# Patient Record
Sex: Female | Born: 1945 | Race: White | Hispanic: No | Smoking: Former smoker
Health system: Southern US, Community
[De-identification: ages and names within clinical notes are randomized; demographics above are authoritative.]

## PROBLEM LIST (undated history)

## (undated) DIAGNOSIS — F0390 Unspecified dementia without behavioral disturbance: Secondary | ICD-10-CM

## (undated) DIAGNOSIS — M419 Scoliosis, unspecified: Secondary | ICD-10-CM

## (undated) DIAGNOSIS — N179 Acute kidney failure, unspecified: Secondary | ICD-10-CM

## (undated) DIAGNOSIS — N183 Chronic kidney disease, stage 3 unspecified: Secondary | ICD-10-CM

## (undated) DIAGNOSIS — Z7901 Long term (current) use of anticoagulants: Secondary | ICD-10-CM

## (undated) DIAGNOSIS — Z9889 Other specified postprocedural states: Secondary | ICD-10-CM

## (undated) DIAGNOSIS — I701 Atherosclerosis of renal artery: Secondary | ICD-10-CM

## (undated) DIAGNOSIS — L511 Stevens-Johnson syndrome: Secondary | ICD-10-CM

## (undated) DIAGNOSIS — G8929 Other chronic pain: Secondary | ICD-10-CM

## (undated) DIAGNOSIS — T8859XA Other complications of anesthesia, initial encounter: Secondary | ICD-10-CM

## (undated) DIAGNOSIS — R6520 Severe sepsis without septic shock: Secondary | ICD-10-CM

## (undated) DIAGNOSIS — M5030 Other cervical disc degeneration, unspecified cervical region: Secondary | ICD-10-CM

## (undated) DIAGNOSIS — M5136 Other intervertebral disc degeneration, lumbar region: Secondary | ICD-10-CM

## (undated) DIAGNOSIS — R112 Nausea with vomiting, unspecified: Secondary | ICD-10-CM

## (undated) DIAGNOSIS — F32A Depression, unspecified: Secondary | ICD-10-CM

## (undated) DIAGNOSIS — F0393 Unspecified dementia, unspecified severity, with mood disturbance: Secondary | ICD-10-CM

## (undated) DIAGNOSIS — I700 Atherosclerosis of aorta: Secondary | ICD-10-CM

## (undated) DIAGNOSIS — A4151 Sepsis due to Escherichia coli [E. coli]: Secondary | ICD-10-CM

## (undated) DIAGNOSIS — T50905A Adverse effect of unspecified drugs, medicaments and biological substances, initial encounter: Secondary | ICD-10-CM

## (undated) DIAGNOSIS — G4700 Insomnia, unspecified: Secondary | ICD-10-CM

## (undated) DIAGNOSIS — M51369 Other intervertebral disc degeneration, lumbar region without mention of lumbar back pain or lower extremity pain: Secondary | ICD-10-CM

## (undated) DIAGNOSIS — Z79899 Other long term (current) drug therapy: Secondary | ICD-10-CM

## (undated) DIAGNOSIS — M810 Age-related osteoporosis without current pathological fracture: Secondary | ICD-10-CM

## (undated) DIAGNOSIS — M1990 Unspecified osteoarthritis, unspecified site: Secondary | ICD-10-CM

## (undated) DIAGNOSIS — J189 Pneumonia, unspecified organism: Secondary | ICD-10-CM

## (undated) DIAGNOSIS — I10 Essential (primary) hypertension: Secondary | ICD-10-CM

## (undated) DIAGNOSIS — Z87442 Personal history of urinary calculi: Secondary | ICD-10-CM

## (undated) DIAGNOSIS — R0600 Dyspnea, unspecified: Secondary | ICD-10-CM

## (undated) DIAGNOSIS — K219 Gastro-esophageal reflux disease without esophagitis: Secondary | ICD-10-CM

## (undated) DIAGNOSIS — R1310 Dysphagia, unspecified: Secondary | ICD-10-CM

## (undated) DIAGNOSIS — E46 Unspecified protein-calorie malnutrition: Secondary | ICD-10-CM

## (undated) DIAGNOSIS — D649 Anemia, unspecified: Secondary | ICD-10-CM

## (undated) HISTORY — PX: HEMORRHOID SURGERY: SHX153

## (undated) HISTORY — PX: HAND SURGERY: SHX662

## (undated) HISTORY — PX: CATARACT EXTRACTION, BILATERAL: SHX1313

## (undated) HISTORY — DX: Unspecified osteoarthritis, unspecified site: M19.90

## (undated) HISTORY — PX: FOOT SURGERY: SHX648

## (undated) HISTORY — DX: Anemia, unspecified: D64.9

## (undated) HISTORY — DX: Acute kidney failure, unspecified: N17.9

## (undated) HISTORY — DX: Essential (primary) hypertension: I10

## (undated) HISTORY — PX: ABDOMINAL HYSTERECTOMY: SHX81

## (undated) HISTORY — PX: FRACTURE SURGERY: SHX138

## (undated) HISTORY — DX: Other chronic pain: G89.29

## (undated) HISTORY — DX: Unspecified dementia, unspecified severity, without behavioral disturbance, psychotic disturbance, mood disturbance, and anxiety: F03.90

## (undated) HISTORY — DX: Age-related osteoporosis without current pathological fracture: M81.0

## (undated) HISTORY — DX: Other intervertebral disc degeneration, lumbar region: M51.36

## (undated) HISTORY — PX: BACK SURGERY: SHX140

## (undated) HISTORY — DX: Gastro-esophageal reflux disease without esophagitis: K21.9

## (undated) HISTORY — PX: POSTERIOR REPAIR: SHX2254

---

## 1996-02-18 HISTORY — PX: BACK SURGERY: SHX140

## 2004-04-06 ENCOUNTER — Emergency Department: Payer: Self-pay | Admitting: Emergency Medicine

## 2004-07-01 ENCOUNTER — Ambulatory Visit: Payer: Self-pay | Admitting: Internal Medicine

## 2004-07-15 ENCOUNTER — Other Ambulatory Visit: Payer: Self-pay

## 2004-07-15 ENCOUNTER — Emergency Department: Payer: Self-pay | Admitting: Emergency Medicine

## 2004-10-30 ENCOUNTER — Ambulatory Visit: Payer: Self-pay | Admitting: Internal Medicine

## 2005-01-06 ENCOUNTER — Ambulatory Visit: Payer: Self-pay | Admitting: Internal Medicine

## 2005-01-14 ENCOUNTER — Ambulatory Visit: Payer: Self-pay | Admitting: Internal Medicine

## 2005-01-31 ENCOUNTER — Emergency Department: Payer: Self-pay | Admitting: Emergency Medicine

## 2005-01-31 ENCOUNTER — Other Ambulatory Visit: Payer: Self-pay

## 2005-07-19 ENCOUNTER — Emergency Department: Payer: Self-pay | Admitting: Emergency Medicine

## 2005-11-05 ENCOUNTER — Ambulatory Visit: Payer: Self-pay | Admitting: Internal Medicine

## 2006-01-04 ENCOUNTER — Emergency Department: Payer: Self-pay

## 2006-01-21 ENCOUNTER — Ambulatory Visit: Payer: Self-pay | Admitting: Internal Medicine

## 2007-05-27 ENCOUNTER — Ambulatory Visit: Payer: Self-pay | Admitting: Internal Medicine

## 2007-05-28 ENCOUNTER — Other Ambulatory Visit: Payer: Self-pay

## 2007-05-28 ENCOUNTER — Emergency Department: Payer: Self-pay | Admitting: Emergency Medicine

## 2007-07-20 ENCOUNTER — Ambulatory Visit: Payer: Self-pay | Admitting: Internal Medicine

## 2008-04-26 ENCOUNTER — Ambulatory Visit: Payer: Self-pay | Admitting: Internal Medicine

## 2009-07-16 ENCOUNTER — Ambulatory Visit: Payer: Self-pay | Admitting: Internal Medicine

## 2009-08-14 ENCOUNTER — Ambulatory Visit: Payer: Self-pay | Admitting: Pain Medicine

## 2009-08-22 ENCOUNTER — Ambulatory Visit: Payer: Self-pay | Admitting: Pain Medicine

## 2009-09-25 ENCOUNTER — Ambulatory Visit: Payer: Self-pay | Admitting: Pain Medicine

## 2009-10-17 ENCOUNTER — Ambulatory Visit: Payer: Self-pay | Admitting: Pain Medicine

## 2009-10-24 ENCOUNTER — Emergency Department: Payer: Self-pay | Admitting: General Practice

## 2009-11-07 ENCOUNTER — Ambulatory Visit: Payer: Self-pay | Admitting: Pain Medicine

## 2009-11-21 ENCOUNTER — Ambulatory Visit: Payer: Self-pay | Admitting: Pain Medicine

## 2009-12-18 ENCOUNTER — Ambulatory Visit: Payer: Self-pay | Admitting: Pain Medicine

## 2009-12-24 ENCOUNTER — Ambulatory Visit: Payer: Self-pay | Admitting: Pain Medicine

## 2009-12-27 ENCOUNTER — Ambulatory Visit: Payer: Self-pay | Admitting: Internal Medicine

## 2010-03-12 ENCOUNTER — Ambulatory Visit: Payer: Self-pay | Admitting: General Practice

## 2010-03-17 ENCOUNTER — Other Ambulatory Visit: Payer: Self-pay | Admitting: Physician Assistant

## 2010-03-22 ENCOUNTER — Ambulatory Visit: Payer: Self-pay | Admitting: Internal Medicine

## 2010-03-22 ENCOUNTER — Other Ambulatory Visit: Payer: Self-pay | Admitting: Physician Assistant

## 2010-03-29 ENCOUNTER — Ambulatory Visit: Payer: Self-pay | Admitting: Internal Medicine

## 2010-04-09 ENCOUNTER — Ambulatory Visit: Payer: Self-pay | Admitting: General Practice

## 2010-04-18 ENCOUNTER — Ambulatory Visit: Payer: Self-pay | Admitting: Internal Medicine

## 2010-05-19 ENCOUNTER — Ambulatory Visit: Payer: Self-pay | Admitting: Internal Medicine

## 2010-06-18 ENCOUNTER — Ambulatory Visit: Payer: Self-pay | Admitting: Internal Medicine

## 2010-07-19 ENCOUNTER — Ambulatory Visit: Payer: Self-pay

## 2010-07-19 ENCOUNTER — Ambulatory Visit: Payer: Self-pay | Admitting: Internal Medicine

## 2011-01-17 ENCOUNTER — Ambulatory Visit: Payer: Self-pay | Admitting: Internal Medicine

## 2011-01-31 ENCOUNTER — Ambulatory Visit: Payer: Self-pay | Admitting: Internal Medicine

## 2011-02-27 ENCOUNTER — Ambulatory Visit: Payer: Self-pay | Admitting: Internal Medicine

## 2011-04-18 ENCOUNTER — Inpatient Hospital Stay: Payer: Self-pay | Admitting: Internal Medicine

## 2011-04-18 LAB — CBC WITH DIFFERENTIAL/PLATELET
Basophil #: 0 10*3/uL (ref 0.0–0.1)
Eosinophil %: 0.4 %
HCT: 29.7 % — ABNORMAL LOW (ref 35.0–47.0)
HGB: 10.2 g/dL — ABNORMAL LOW (ref 12.0–16.0)
Lymphocyte #: 1.6 10*3/uL (ref 1.0–3.6)
MCH: 29.5 pg (ref 26.0–34.0)
MCV: 86 fL (ref 80–100)
Monocyte #: 0.3 10*3/uL (ref 0.0–0.7)
Monocyte %: 3.4 %
Neutrophil #: 8 10*3/uL — ABNORMAL HIGH (ref 1.4–6.5)
Platelet: 170 10*3/uL (ref 150–440)
RBC: 3.45 10*6/uL — ABNORMAL LOW (ref 3.80–5.20)
WBC: 10 10*3/uL (ref 3.6–11.0)

## 2011-04-18 LAB — COMPREHENSIVE METABOLIC PANEL
Albumin: 3.6 g/dL (ref 3.4–5.0)
Alkaline Phosphatase: 97 U/L (ref 50–136)
Anion Gap: 10 (ref 7–16)
Bilirubin,Total: 0.5 mg/dL (ref 0.2–1.0)
Calcium, Total: 8.5 mg/dL (ref 8.5–10.1)
Glucose: 138 mg/dL — ABNORMAL HIGH (ref 65–99)
Osmolality: 277 (ref 275–301)
Potassium: 3 mmol/L — ABNORMAL LOW (ref 3.5–5.1)
Sodium: 136 mmol/L (ref 136–145)

## 2011-04-18 LAB — URINALYSIS, COMPLETE
Bacteria: NONE SEEN
Blood: NEGATIVE
Glucose,UR: NEGATIVE mg/dL (ref 0–75)
Leukocyte Esterase: NEGATIVE
Nitrite: NEGATIVE
Protein: NEGATIVE
WBC UR: <1 /HPF (ref 0–5)

## 2011-04-18 LAB — PROTIME-INR: Prothrombin Time: 14.2 secs (ref 11.5–14.7)

## 2011-04-18 LAB — LIPASE, BLOOD: Lipase: 147 U/L (ref 73–393)

## 2011-04-19 LAB — COMPREHENSIVE METABOLIC PANEL
Anion Gap: 16 (ref 7–16)
Calcium, Total: 8.9 mg/dL (ref 8.5–10.1)
Chloride: 102 mmol/L (ref 98–107)
Co2: 21 mmol/L (ref 21–32)
EGFR (African American): >60
EGFR (Non-African Amer.): >60
Osmolality: 280 (ref 275–301)
Potassium: 3.5 mmol/L (ref 3.5–5.1)
SGOT(AST): 19 U/L (ref 15–37)
SGPT (ALT): 12 U/L

## 2011-04-19 LAB — CBC WITH DIFFERENTIAL/PLATELET
Basophil %: 0.1 %
Eosinophil #: 0 10*3/uL (ref 0.0–0.7)
Eosinophil %: 0 %
HGB: 10.3 g/dL — ABNORMAL LOW (ref 12.0–16.0)
Lymphocyte #: 0.8 10*3/uL — ABNORMAL LOW (ref 1.0–3.6)
Lymphocyte %: 6.6 %
MCH: 29.4 pg (ref 26.0–34.0)
MCHC: 33.4 g/dL (ref 32.0–36.0)
MCV: 88 fL (ref 80–100)
Monocyte #: 0.2 10*3/uL (ref 0.0–0.7)
Neutrophil %: 91.4 %
RBC: 3.5 10*6/uL — ABNORMAL LOW (ref 3.80–5.20)

## 2011-04-20 DIAGNOSIS — I059 Rheumatic mitral valve disease, unspecified: Secondary | ICD-10-CM

## 2011-04-23 LAB — CULTURE, BLOOD (SINGLE)

## 2011-11-08 ENCOUNTER — Emergency Department: Payer: Self-pay | Admitting: Emergency Medicine

## 2011-11-10 ENCOUNTER — Ambulatory Visit: Payer: Self-pay | Admitting: Gastroenterology

## 2011-11-12 ENCOUNTER — Ambulatory Visit: Payer: Self-pay | Admitting: Gastroenterology

## 2011-11-17 LAB — PATHOLOGY REPORT

## 2011-12-03 ENCOUNTER — Ambulatory Visit: Payer: Self-pay | Admitting: Gastroenterology

## 2012-01-22 ENCOUNTER — Emergency Department: Payer: Self-pay | Admitting: Emergency Medicine

## 2012-02-09 ENCOUNTER — Ambulatory Visit: Payer: Self-pay | Admitting: Internal Medicine

## 2012-12-06 ENCOUNTER — Ambulatory Visit: Payer: Self-pay | Admitting: Internal Medicine

## 2012-12-22 ENCOUNTER — Ambulatory Visit: Payer: Self-pay | Admitting: General Practice

## 2013-01-06 ENCOUNTER — Ambulatory Visit: Payer: Self-pay | Admitting: Internal Medicine

## 2013-01-06 LAB — CREATININE, SERUM
Creatinine: 1.32 mg/dL — ABNORMAL HIGH (ref 0.60–1.30)
EGFR (African American): 49 — ABNORMAL LOW
EGFR (Non-African Amer.): 42 — ABNORMAL LOW

## 2013-01-06 LAB — IRON AND TIBC
Iron Bind.Cap.(Total): 377 ug/dL (ref 250–450)
Iron Saturation: 27 %
Iron: 103 ug/dL (ref 50–170)

## 2013-01-06 LAB — CBC CANCER CENTER
Basophil %: 0.7 %
Eosinophil #: 0.1 x10 3/mm (ref 0.0–0.7)
HCT: 29.4 % — ABNORMAL LOW (ref 35.0–47.0)
HGB: 9.7 g/dL — ABNORMAL LOW (ref 12.0–16.0)
Lymphocyte #: 1.7 x10 3/mm (ref 1.0–3.6)
MCH: 30.3 pg (ref 26.0–34.0)
MCV: 92 fL (ref 80–100)
Monocyte %: 9.3 %
Neutrophil %: 48.6 %
Platelet: 160 x10 3/mm (ref 150–440)
RDW: 14.3 % (ref 11.5–14.5)
WBC: 4.2 x10 3/mm (ref 3.6–11.0)

## 2013-01-06 LAB — HEPATIC FUNCTION PANEL A (ARMC)
Albumin: 3.6 g/dL (ref 3.4–5.0)
Alkaline Phosphatase: 69 U/L (ref 50–136)

## 2013-01-12 LAB — CBC CANCER CENTER
Bands: 1 %
HCT: 28.2 % — ABNORMAL LOW (ref 35.0–47.0)
MCH: 30.4 pg (ref 26.0–34.0)
MCHC: 33.2 g/dL (ref 32.0–36.0)
Monocytes: 6 %
Platelet: 137 x10 3/mm — ABNORMAL LOW (ref 150–440)
RDW: 13.9 % (ref 11.5–14.5)
Segmented Neutrophils: 46 %
Variant Lymphocyte: 3 %
WBC: 3.9 x10 3/mm (ref 3.6–11.0)

## 2013-01-17 ENCOUNTER — Ambulatory Visit: Payer: Self-pay | Admitting: Internal Medicine

## 2013-01-17 LAB — CBC CANCER CENTER
Basophil #: 0 x10 3/mm (ref 0.0–0.1)
Basophil %: 0.9 %
Eosinophil #: 0.1 x10 3/mm (ref 0.0–0.7)
HGB: 10.1 g/dL — ABNORMAL LOW (ref 12.0–16.0)
MCH: 30.5 pg (ref 26.0–34.0)
MCHC: 33.9 g/dL (ref 32.0–36.0)
Monocyte #: 0.3 x10 3/mm (ref 0.2–0.9)
Monocyte %: 9 %
Neutrophil #: 1.6 x10 3/mm (ref 1.4–6.5)
RDW: 14.1 % (ref 11.5–14.5)

## 2013-01-19 LAB — PROT IMMUNOELECTROPHORES(ARMC)

## 2013-01-25 LAB — CANCER CENTER HEMOGLOBIN: HGB: 9.9 g/dL — ABNORMAL LOW (ref 12.0–16.0)

## 2013-02-08 LAB — CBC CANCER CENTER
Eosinophil #: 0.1 x10 3/mm (ref 0.0–0.7)
Eosinophil %: 2.9 %
HCT: 33.9 % — ABNORMAL LOW (ref 35.0–47.0)
Lymphocyte #: 1.7 x10 3/mm (ref 1.0–3.6)
Lymphocyte %: 44.2 %
MCH: 31 pg (ref 26.0–34.0)
MCHC: 33.8 g/dL (ref 32.0–36.0)
MCV: 92 fL (ref 80–100)
Monocyte %: 7.5 %
Neutrophil %: 44.4 %
RBC: 3.7 10*6/uL — ABNORMAL LOW (ref 3.80–5.20)
RDW: 14.3 % (ref 11.5–14.5)
WBC: 3.7 x10 3/mm (ref 3.6–11.0)

## 2013-02-16 ENCOUNTER — Ambulatory Visit: Payer: Self-pay | Admitting: Internal Medicine

## 2013-02-17 ENCOUNTER — Ambulatory Visit: Payer: Self-pay | Admitting: Internal Medicine

## 2013-02-22 LAB — CBC CANCER CENTER
BASOS PCT: 0.9 %
Basophil #: 0 x10 3/mm (ref 0.0–0.1)
EOS PCT: 5.3 %
Eosinophil #: 0.2 x10 3/mm (ref 0.0–0.7)
HCT: 30.3 % — AB (ref 35.0–47.0)
HGB: 10 g/dL — AB (ref 12.0–16.0)
Lymphocyte #: 1.5 x10 3/mm (ref 1.0–3.6)
Lymphocyte %: 38.7 %
MCH: 30.1 pg (ref 26.0–34.0)
MCHC: 33.1 g/dL (ref 32.0–36.0)
MCV: 91 fL (ref 80–100)
MONO ABS: 0.3 x10 3/mm (ref 0.2–0.9)
Monocyte %: 8.5 %
NEUTROS ABS: 1.8 x10 3/mm (ref 1.4–6.5)
Neutrophil %: 46.6 %
PLATELETS: 157 x10 3/mm (ref 150–440)
RBC: 3.33 10*6/uL — ABNORMAL LOW (ref 3.80–5.20)
RDW: 13.6 % (ref 11.5–14.5)
WBC: 3.9 x10 3/mm (ref 3.6–11.0)

## 2013-03-20 ENCOUNTER — Ambulatory Visit: Payer: Self-pay | Admitting: Internal Medicine

## 2013-03-27 ENCOUNTER — Emergency Department: Payer: Self-pay | Admitting: Emergency Medicine

## 2013-03-29 LAB — CBC CANCER CENTER
Basophil #: 0 x10 3/mm (ref 0.0–0.1)
Basophil %: 0.9 %
Eosinophil #: 0.2 x10 3/mm (ref 0.0–0.7)
Eosinophil %: 5 %
HCT: 31.2 % — ABNORMAL LOW (ref 35.0–47.0)
HGB: 10.5 g/dL — ABNORMAL LOW (ref 12.0–16.0)
LYMPHS ABS: 1.2 x10 3/mm (ref 1.0–3.6)
LYMPHS PCT: 35.3 %
MCH: 30.5 pg (ref 26.0–34.0)
MCHC: 33.8 g/dL (ref 32.0–36.0)
MCV: 90 fL (ref 80–100)
Monocyte #: 0.3 x10 3/mm (ref 0.2–0.9)
Monocyte %: 7.9 %
Neutrophil #: 1.7 x10 3/mm (ref 1.4–6.5)
Neutrophil %: 50.9 %
PLATELETS: 155 x10 3/mm (ref 150–440)
RBC: 3.46 10*6/uL — ABNORMAL LOW (ref 3.80–5.20)
RDW: 14.1 % (ref 11.5–14.5)
WBC: 3.3 x10 3/mm — ABNORMAL LOW (ref 3.6–11.0)

## 2013-03-30 LAB — KAPPA/LAMBDA FREE LIGHT CHAINS (ARMC)

## 2013-04-08 LAB — CANCER CENTER HEMOGLOBIN: HGB: 10.2 g/dL — ABNORMAL LOW (ref 12.0–16.0)

## 2013-04-17 ENCOUNTER — Ambulatory Visit: Payer: Self-pay | Admitting: Internal Medicine

## 2013-04-19 LAB — CANCER CENTER HEMOGLOBIN: HGB: 10.1 g/dL — ABNORMAL LOW (ref 12.0–16.0)

## 2013-05-03 LAB — CANCER CENTER HEMOGLOBIN: HGB: 10.2 g/dL — ABNORMAL LOW (ref 12.0–16.0)

## 2013-05-04 ENCOUNTER — Ambulatory Visit: Payer: Self-pay | Admitting: Ophthalmology

## 2013-05-04 LAB — POTASSIUM: POTASSIUM: 3.7 mmol/L (ref 3.5–5.1)

## 2013-05-04 LAB — HEMOGLOBIN: HGB: 10.9 g/dL — AB (ref 12.0–16.0)

## 2013-05-09 ENCOUNTER — Ambulatory Visit: Payer: Self-pay | Admitting: Ophthalmology

## 2013-05-18 ENCOUNTER — Ambulatory Visit: Payer: Self-pay | Admitting: Internal Medicine

## 2013-05-23 ENCOUNTER — Ambulatory Visit: Payer: Self-pay | Admitting: Ophthalmology

## 2013-07-28 ENCOUNTER — Ambulatory Visit: Payer: Self-pay | Admitting: Internal Medicine

## 2013-09-09 ENCOUNTER — Ambulatory Visit: Payer: Self-pay | Admitting: Orthopedic Surgery

## 2014-05-31 ENCOUNTER — Ambulatory Visit
Admit: 2014-05-31 | Disposition: A | Discharge status: Home / Self Care | Payer: Self-pay | Attending: Internal Medicine | Admitting: Internal Medicine

## 2014-06-10 NOTE — Op Note (Signed)
PATIENT NAME:  Alison Price, Alison Price MR#:  [id number] DATE OF BIRTH:  [birth year]  DATE OF PROCEDURE:  05/09/2013  PREOPERATIVE DIAGNOSIS:  Cataract, left eye.    POSTOPERATIVE DIAGNOSIS:  Cataract, left eye.  PROCEDURE PERFORMED:  Extracapsular cataract extraction using phacoemulsification with placement of an Alcon SN6CWF, 11.0-diopter posterior chamber lens, serial number [id number].  SURGEON:  Maylon PeppersSteven Price. Chaka Jefferys, MD  ASSISTANT:  None.  ANESTHESIA:  4% lidocaine and 0.75% Marcaine in Price 50/50 mixture with Hylenex 10 units/mL added, given as Price peribulbar.  ANESTHESIOLOGIST:  Dr. Darleene CleaverVan Staveren.  COMPLICATIONS:  None.  ESTIMATED BLOOD LOSS:  Less than 1 ml.  DESCRIPTION OF PROCEDURE:  The patient was brought to the operating room and given Price peribulbar block.  The patient was then prepped and draped in the usual fashion.  The vertical rectus muscles were imbricated using 5-0 silk sutures.  These sutures were then clamped to the sterile drapes as bridle sutures.  Price limbal peritomy was performed extending two clock hours and hemostasis was obtained with cautery.  Price partial thickness scleral groove was made at the surgical limbus and dissected anteriorly in Price lamellar dissection using an Alcon crescent knife.  The anterior chamber was entered supero-temporally with Price Superblade and through the lamellar dissection with Price 2.6 mm keratome.  DisCoVisc was used to replace the aqueous and Price continuous tear capsulorrhexis was carried out.  Hydrodissection and hydrodelineation were carried out with balanced salt and Price 27 gauge canula.  The nucleus was rotated to confirm the effectiveness of the hydrodissection.  Phacoemulsification was carried out using Price divide-and-conquer technique.  Total ultrasound time was 1 minute and 4 seconds with an average power of 25.3 percent.  CDE 30.27.  Irrigation/aspiration was used to remove the residual cortex.  DisCoVisc was used to inflate the capsule and the  internal incision was enlarged to 3 mm with the crescent knife.  The intraocular lens was folded and inserted into the capsular bag using the AcrySert delivery system.  Irrigation/aspiration was used to remove the residual DisCoVisc.  Miostat was injected into the anterior chamber through the paracentesis track to inflate the anterior chamber and induce miosis.  The wound was checked for leaks and none were found. No suture was added.  The conjunctiva was closed with cautery and the bridle sutures were removed.  Two drops of 0.3% Vigamox were placed on the eye.   An eye shield was placed on the eye.  The patient was discharged to the recovery room in good condition.     ____________________________ Maylon PeppersSteven Price. Revonda Menter, MD sad:cs D: 05/09/2013 13:49:42 ET T: 05/09/2013 15:36:05 ET JOB#: [id number]  cc: Viviann SpareSteven Price. Rayce Brahmbhatt, MD, <Dictator> Erline LevineSTEVEN Price Nalleli Largent MD ELECTRONICALLY SIGNED 05/16/2013 13:28

## 2014-06-10 NOTE — Op Note (Signed)
PATIENT NAME:  Alison Price, Alison Price MR#:  [id number] DATE OF BIRTH:  August 05, 1945  DATE OF PROCEDURE:  05/23/2013  PREOPERATIVE DIAGNOSIS: Cataract, right eye.   POSTOPERATIVE DIAGNOSIS: Cataract, right eye.  PROCEDURE PERFORMED: Extracapsular cataract extraction using phacoemulsification and placement of Alcon SN6CWS 12.0-diopter posterior chamber lens, serial number [id number].   SURGEON: Maylon PeppersSteven Price. Merrin Mcvicker, M.D.   ASSISTANT:  None.   ANESTHESIA: 4% lidocaine and 0.75% Marcaine, 50-50 mixture with 10 units/mL of Hylenex added given as Price peribulbar.   ANESTHESIOLOGIST: Dr. Dimple Caseyice.   COMPLICATIONS: None.   ESTIMATED BLOOD LOSS: Less than 1 mL.   DESCRIPTION OF PROCEDURE:  The patient was brought to the operating room and given Price peribulbar block.  The patient was then prepped and draped in the usual fashion.  The vertical rectus muscles were imbricated using 5-0 silk sutures.  These sutures were then clamped to the sterile drapes as bridle sutures.  Price limbal peritomy was performed extending two clock hours and hemostasis was obtained with cautery.  Price partial thickness scleral groove was made at the surgical limbus and dissected anteriorly in Price lamellar dissection using an Alcon crescent knife.  The anterior chamber was entered superonasally with Price Superblade and through the lamellar dissection with Price 2.6 mm keratome.  DisCoVisc was used to replace the aqueous and Price continuous tear capsulorrhexis was carried out.  Hydrodissection and hydrodelineation were carried out with balanced salt and Price 27 gauge canula.  The nucleus was rotated to confirm the effectiveness of the hydrodissection.  Phacoemulsification was carried out using Price divide-and-conquer technique.  Total ultrasound time was 1 minute and 4 seconds with an average power of 23.9%. CDE of 31.13.  Irrigation/aspiration was used to remove the residual cortex.  DisCoVisc was used to inflate the capsule and the internal incision was  enlarged to 3 mm with the crescent knife.  The intraocular lens was folded and inserted into the capsular bag using the AcrySert delivery system.  Irrigation/aspiration was used to remove the residual DisCoVisc.  Miostat was injected into the anterior chamber through the paracentesis track to inflate the anterior chamber and induce miosis;  0.1% mL cefuroxime containing 1 mg of drug was injected via the paracentesis tract. The wound was checked for leaks and none were found. The conjunctiva was closed with cautery and the bridle sutures were removed.  Two drops of 0.3% Vigamox were placed on the eye.   An eye shield was placed on the eye.  The patient was discharged to the recovery room in good condition.   ____________________________ Maylon PeppersSteven Price. Jaclyn Carew, MD sad:dmm D: 05/23/2013 12:06:08 ET T: 05/23/2013 12:19:54 ET JOB#: [id number]  cc: Viviann SpareSteven Price. Draycen Leichter, MD, <Dictator> Erline LevineSTEVEN Price Hershy Flenner MD ELECTRONICALLY SIGNED 05/30/2013 12:23

## 2014-06-11 NOTE — H&P (Signed)
PATIENT NAME:  Alison [name], Alison A MR#:  [id number] DATE OF BIRTH:  12/21/1945  DATE OF ADMISSION:  04/18/2011  PRIMARY CARE PHYSICIAN: Dr. Arlana Pouch  HISTORY OF PRESENT ILLNESS:  The patient is a 69 year old Caucasian female with past medical history significant for history of the cervical spine back pain, history of osteoarthritis, hypertension, and  anemia who presented to the hospital with complaints of nausea and vomiting. According to the patient she was not feeling well for the past four or five weeks. She had some fevers as well as a very sore throat, was unable to swallow or eat.  She had significant sinus congestion and over the past few weeks she has also been noticing increased worsening of shortness of breath especially when she exerts herself. She had at least three courses of antibiotics, 2 rounds of Levaquin and some other antibiotic which was given by Dr. Arlana Pouch that she does not remember. However, now she is experiencing increasing shortness of breath. Yesterday she started having nausea and vomiting. She vomited numerous times with green vomitus. However, denied any blood in her vomitus.  She admitted having chronic constipation and  denied any recent diarrhea. Her last bowel movement was approximately two days ago. She has also been having some diffuse abdominal pain, but she attributes this pain to her nausea and relentless vomiting. She denied any specific area of discomfort in her abdomen. On arrival to the Emergency Room, she was noted to have bilateral pneumonia and hospitalist services were contacted for admission.   PAST MEDICAL HISTORY:  1. History of the cervical spine disease. The patient is to undergo cervical spine surgery.  2. History of anemia followed by Dr. Lorre Nick.  3. History of back surgeries.  4. Chronic back pain.  5. Left sciatica with left leg weakness.  6. Osteoarthritis.  7. Hypertension.  8. Partial hysterectomy for bleeding.  9. History of chronic  dizziness. 10. Osteoporosis.  11. Appendectomy.   MEDICATIONS:  1. Norvasc 10 mg p.o. daily.  2. Meclizine 25 mg twice daily as needed. 3. Protonix 40 mg p.o. daily. 4. She was on Trilipix in the past. It is unclear if she is still taking this medication.  5. OxyContin 160 mg p.o. twice daily.  6. Slow Fe 45 mg p.o. daily.  7. Allegra 180 mg p.o. daily.  8. Etodolac 400 mg p.o. twice daily.  9. Lisinopril 10 mg p.o. daily.  10. Vitamin B12 2500 mcg p.o. daily.   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 Morphine gives her itching.   FAMILY HISTORY: The patient's mother died at the age of  20 of cerebrovascular accident. The patient's grandmother died of natural causes. The patient's paternal grandmother had diabetes. The patient's family otherwise does not have any significant family history of medical problems. However female organ cancer is prevalent in the patient's family.   SOCIAL HISTORY: The patient is divorced, has two children and three grandchildren. She lives with her son and grandson. She smoked 2 packs a day for 20 years, quit 14 years ago. She denies any alcohol. She works at [employer] in the office.  REVIEW OF SYSTEMS:  Positive for fevers, fatigue, and weakness, weight loss of approximately 14 pounds in the past few weeks, left thoracic back pain/soreness which she described as intermittent discomfort with alleviating or aggravating factors, also postnasal drip and difficulty swallowing because of sinus congestion and sore throat. Also some sinus pain and dyspnea especially on exertion. Some nausea and vomiting  today as well as diffuse abdominal pain and chronic constipation. Also back pain especially in the lower back. Otherwise denies any pains anywhere else or weight gain.  EYES:  Denies any blurry vision, double vision, glaucoma, or cataracts. ENT: Denies any tinnitus, allergies, epistaxis, sinus pain, dentures, or difficulty  swallowing. RESPIRATORY: Denies any cough, wheezes, asthma, or chronic obstructive pulmonary disease. CARDIOVASCULAR: Denies orthopnea, edema, arrhythmias, palpitations, or syncope.  GASTROINTESTINAL: Denies any hematemesis, rectal bleeding, or change in bowel habits. GENITOURINARY: Denies dysuria, hematuria, frequency, or incontinence. ENDOCRINE: Denies any polydipsia, nocturia, thyroid problems, heat or cold intolerance, or thirst.  HEMATOLOGIC: Denies anemia, easy bruising, bleeding, or swollen glands. SKIN: Denies any acne, lesions, or change in moles. MUSCULOSKELETAL: Denies arthritis, cramps, swelling, or gout. NEUROLOGIC: No numbness or tremor.  PSYCH: No anxiety, insomnia, or depression.   PHYSICAL EXAMINATION:  VITAL SIGNS:  On arrival temperature 102, pulse 99, respiration rate 20, blood pressure 170/71,  saturation 96% on room air.   GENERAL: This is a well-developed, well-nourished Caucasian female, pale, sitting on the stretcher.   HEENT: Pupils equal, round, reactive to light. Extraocular movements intact.  No icterus or conjunctivitis. He has normal hearing. No pharynx erythema.  Mucosa is moist.   NECK: No masses, supple, nontender. Thyroid is not enlarged. No adenopathy. No JVD or carotid bruits bilaterally. Full range of motion.   LUNGS: Markedly diminished breath sounds bilaterally, more on the right side. Few wheezes were heard. No labored inspirations, however. No increased effort. No dullness to percussion. Not in overt respiratory distress. Better air entrance on the left.   CARDIOVASCULAR: S1, S2 appreciated. No murmurs, gallops, or rubs noted. PMI not lateralized. Chest is nontender to palpation. 1+ pedal pulses. No lower extremity edema, calf tenderness, or cyanosis noted.   ABDOMEN: Soft and nontender. Bowel sounds are present.  No hepatosplenomegaly or masses are noted.   RECTAL: Deferred. Mild discomfort was noted on palpation on the right side.   MUSCULOSKELETAL:  Muscle strength 5/5 in all extremities. No cyanosis or degenerative joint disease. The patient does have kyphosis. Gait is not tested.   SKIN: No rashes, lesions, erythema, nodularity, or induration. It was warm and dry to palpation.   LYMPH:  No adenopathy in the cervical region.  NEUROLOGIC: Cranial nerves grossly intact. Sensory is intact. No dysarthria or aphasia. The patient is alert, oriented to time, person, and place, cooperative. Memory is good.   No significant confusion, agitation, or depression noted.   EKG showed normal sinus rhythm at 74 beats per minute, normal axis, moderate voltage criteria for left ventricular hypertrophy, may be normal variant. Prolonged QT. QTc is 492 ms. No acute ST-T changes were noted.  LABORATORY DATA:  Glucose 138. BUN of 20, potassium 3, otherwise unremarkable BMP. Lipase level normal at 147. Liver enzymes were normal. White blood cell count is normal at 10, hemoglobin 10.2, platelets 170. Absolute neutrophil count is elevated at 8. Coagulation panel is unremarkable.    Urinalysis: Straw clear urine, negative for glucose, bilirubin, or ketones. Specific gravity 1.01, pH 7, negative for blood, protein, nitrites, or leukocyte esterase. One red blood cells, less than one white blood cell. No bacteria or epithelial cells were noted.   RADIOLOGIC STUDIES: Ultrasound of abdomen, limited survey 04/18/2011 showed visualization of the pancreas obscured by overlying bowel gas. Unremarkable right upper quadrant abdominal sonogram according to the radiologist. CT scan of the abdomen and pelvis revealed multifocal bilateral pneumonia and markedly redundant colon moderately filled with stool and gas  without obstruction.   ASSESSMENT AND PLAN:  1. Chronic obstructive pulmonary disease exacerbation. Admit the patient to the medical floor. Start her on steroids, inhalation therapy as well as oxygen as needed. Follow her clinically.  2. Bacterial pneumonia: Continue the  patient on Rocephin as well as Zithromax as soon as patient's blood cultures are taken.  3. Nausea and vomiting: Symptomatic therapy. IV fluids and clear liquid diet.  4. Hypokalemia: Supplement  IV. Check magnesium level and supplement it.  5. Hyperglycemia: Check hemoglobin A1c.  6. Elevated blood pressure without diagnosis of hypertension: The patient's EKG is abnormal and concerning for left ventricular hypertrophy. The patient would benefit from initiation of medication for blood pressure management. We will continue the patient on lisinopril as well as Norvasc.  7. History of chronic dizziness: Continue meclizine as needed.  8. History of gastroesophageal reflux disease: Continue Protonix.  9. History of chronic pain: Continue OxyContin.  10. History of anemia with hemoglobin level of 10.2.  Seems to be stable. We will follow with hydration.  11. Mild dehydration as judged by elevated BUN: Continue the patient on IV fluids.   TIME SPENT:  One hour.   ____________________________ Katharina Caper, MD rv:bjt D: 04/18/2011 07:46:00 ET T: 04/18/2011 09:08:22 ET JOB#: [id number]  cc: Katharina Caper, MD, <Dictator> Jillene Bucks. Arlana Pouch, MD Katharina Caper MD ELECTRONICALLY SIGNED 04/19/2011 14:17

## 2014-06-11 NOTE — Discharge Summary (Signed)
PATIENT NAME:  Alison Alison [name], Alison Alison [name] MR#:  [id number] DATE OF BIRTH:  07/21/1945  DATE OF ADMISSION:  04/18/2011 DATE OF DISCHARGE:  04/23/2011  PRIMARY CARE PHYSICIAN: Dr. Dewaine Oatsenny Tate  PULMONARY:  Dr. Meredeth IdeFleming     DISCHARGE DIAGNOSES:  1. Chronic obstructive pulmonary disease exacerbation. No congestive heart failure. Echo showing elevated RV pressure consistent with severe lung disease and possible cor pulmonale. 2. Bacterial pneumonia, improving on antibiotic.  3. Nausea and vomiting, resolved. Tolerating diet fine.  4. Hypokalemia, repleted and resolved.  5. Hyperglycemia. The patient is not diabetic. This could be steroid induced.  6. Hypertension, on Norvasc and lisinopril. Could be elevated due to pain.  7. Chronic pain on opiates as per the home regimen.   SECONDARY DIAGNOSES:  1. History of cervical spine disease.  2. Anemia followed by Dr. Lorre NickGittin.  3. Back surgery.  4. Chronic back pain.  5. Left-sided sciatica with leg weakness.  6. Osteoarthritis.  7. Hypertension.  8. Osteoporosis.  9. Chronic dizziness.   CONSULTATIONS: Pulmonary, Dr. Belia HemanKasa.  PROCEDURES/RADIOLOGY:   1. CT scan of the abdomen and pelvis with contrast on 03/01 showed bilateral nephrolithiasis. Multifocal pneumonia bilaterally. No ascites or abscess. Fluid and air-filled loops of small bowel without obstruction. No pneumatosis or pneumoperitoneum.   2. Abdominal ultrasound on 03/01 showed overlying bowel gas pattern and difficult visualization of the pancreas due to bowel gas. Otherwise normal.  3. 2-D echocardiogram on 03/03 showed normal LV systolic function, ejection fraction more than 55%. Normal RV systolic function. Mildly dilated left atrium. Elevated RV systolic pressure at 30 to 40 mmHg. Elevated RV systolic pressure consistent with mild pulmonary hypertension.   MAJOR LABORATORY PANEL: Urinalysis on admission was negative. Blood cultures times two were negative.   HISTORY AND SHORT HOSPITAL  COURSE: The patient is Alison [name] 69 year old female with the above-mentioned medical problems who was admitted for severe nausea, vomiting, and shortness of breath. She underwent CT scan of the abdomen and pelvis which showed stool-filled colon and multifocal pneumonia. Please see Dr. Arlys JohnVaickute's dictated History and Physical for further details. The patient was started on IV antibiotics and also good bowel regimen. She did have Alison [name] bowel movement while in the hospital. She was also found to have possible chronic obstructive pulmonary disease exacerbation and was started on IV steroids and nebulizer breathing treatments along with Spiriva and Advair.   Her pneumonia was slowly improving on IV Rocephin and Zithromax but remained quite weak. She was also started on IV imipenem and pulmonary consult was obtained with Dr. Belia HemanKasa who recommended continuing ongoing management. Her nausea and vomiting was resolved after she had Alison [name] bowel movement. The patient was slowly improving and was feeling much better on 03/06. Her breathing was close to her baseline and her nausea and vomiting had resolved. She was tolerating her diet. She did have some issues with her blood pressure, which was thought to be due to inadequate pain control at times, but otherwise her blood pressure remained stable. On the date of discharge her vital signs were as follows: Temperature 98.1, heart rate 67 per minute, respirations 18 per minute, blood pressure 129/69 mmHg.  She was saturating 97% on room air.   PERTINENT PHYSICAL EXAMINATION:  CARDIOVASCULAR: S1, S2 normal. No murmur, rubs, or gallop. LUNGS: Clear to auscultation bilaterally. No wheezing, rales, rhonchi, or crepitation. ABDOMEN: Soft, benign. NEUROLOGIC: Nonfocal examination. All other physical examination remained at baseline.   DISCHARGE MEDICATIONS:  1. Amlodipine 10 mg p.o. daily.  2. Meclizine  25 mg p.o. b.i.d. as needed.  3. Protonix 40 mg p.o. daily. 4. OxyContin 160 mg p.o. twice Alison [name]  day.  5. Slow iron 45 mg p.o. daily.  6. Allegra 180 mg p.o. daily. 7. Etodolac 400 mg p.o. b.i.d.  8. Aspirin 81 mg p.o. daily.  9. Zanaflex 4 mg 2 tablets p.o. every eight hours as needed.  10. Ceftin 500 mg p.o. b.i.d. for two days.  11. Prednisone 60 mg p.o. daily, taper by 10 mg daily until finished.  12. Lisinopril 20 mg p.o. daily.  13. Advair 250/50, 1 puff b.i.d.  14. Spiriva once daily.  15. Combivent 2 puffs every six hours as needed.   DISCHARGE DIET: Low sodium.   DISCHARGE ACTIVITY: As tolerated.   DISCHARGE INSTRUCTIONS AND FOLLOWUP:   1. The patient was instructed to follow up with her primary care physician Dr. Dewaine Oats in 1 to 2 weeks.  2. She will need followup with pulmonary, Dr. Meredeth Ide, in 2 to 3 weeks.   TOTAL TIME DISCHARGING THIS PATIENT: 55 minutes.   ____________________________ Ellamae Sia. Sherryll Burger, MD vss:bjt D: 04/26/2011 13:21:46 ET T: 04/26/2011 16:08:20 ET JOB#: [id number]  cc: Zaydenn Balaguer S. Sherryll Burger, MD, <Dictator> Herbon E. Meredeth Ide, MD Dory Larsen, MD Jillene Bucks. Arlana Pouch, MD Ellamae Sia [facility] MD ELECTRONICALLY SIGNED 04/29/2011 10:56

## 2014-06-20 ENCOUNTER — Other Ambulatory Visit: Payer: Self-pay | Admitting: Internal Medicine

## 2014-06-20 ENCOUNTER — Other Ambulatory Visit: Payer: Self-pay

## 2014-06-20 DIAGNOSIS — Z1231 Encounter for screening mammogram for malignant neoplasm of breast: Secondary | ICD-10-CM

## 2014-07-12 ENCOUNTER — Ambulatory Visit: Payer: PRIVATE HEALTH INSURANCE

## 2014-07-20 ENCOUNTER — Ambulatory Visit: Payer: PRIVATE HEALTH INSURANCE

## 2014-07-26 ENCOUNTER — Ambulatory Visit
Admission: RE | Admit: 2014-07-26 | Discharge: 2014-07-26 | Disposition: A | Discharge status: Home / Self Care | Payer: 59 | Source: Ambulatory Visit | Attending: Internal Medicine | Admitting: Internal Medicine

## 2014-07-26 DIAGNOSIS — Z1231 Encounter for screening mammogram for malignant neoplasm of breast: Secondary | ICD-10-CM | POA: Diagnosis not present

## 2015-02-20 DIAGNOSIS — J019 Acute sinusitis, unspecified: Secondary | ICD-10-CM | POA: Diagnosis not present

## 2015-03-07 DIAGNOSIS — H16223 Keratoconjunctivitis sicca, not specified as Sjogren's, bilateral: Secondary | ICD-10-CM | POA: Diagnosis not present

## 2015-03-23 DIAGNOSIS — E785 Hyperlipidemia, unspecified: Secondary | ICD-10-CM | POA: Diagnosis not present

## 2015-03-23 DIAGNOSIS — D649 Anemia, unspecified: Secondary | ICD-10-CM | POA: Diagnosis not present

## 2015-03-23 DIAGNOSIS — I10 Essential (primary) hypertension: Secondary | ICD-10-CM | POA: Diagnosis not present

## 2015-04-06 DIAGNOSIS — E785 Hyperlipidemia, unspecified: Secondary | ICD-10-CM | POA: Diagnosis not present

## 2015-04-06 DIAGNOSIS — I10 Essential (primary) hypertension: Secondary | ICD-10-CM | POA: Diagnosis not present

## 2015-04-06 DIAGNOSIS — D649 Anemia, unspecified: Secondary | ICD-10-CM | POA: Diagnosis not present

## 2015-04-13 ENCOUNTER — Encounter: Payer: Self-pay | Admitting: Oncology

## 2015-04-13 ENCOUNTER — Inpatient Hospital Stay: Payer: 59 | Attending: Oncology | Admitting: Oncology

## 2015-04-13 ENCOUNTER — Inpatient Hospital Stay: Payer: 59

## 2015-04-13 VITALS — BP 149/63 | HR 70 | Temp 98.3°F | Resp 20 | Wt 143.1 lb

## 2015-04-13 DIAGNOSIS — I10 Essential (primary) hypertension: Secondary | ICD-10-CM | POA: Diagnosis not present

## 2015-04-13 DIAGNOSIS — R5383 Other fatigue: Secondary | ICD-10-CM | POA: Insufficient documentation

## 2015-04-13 DIAGNOSIS — D61818 Other pancytopenia: Secondary | ICD-10-CM | POA: Diagnosis not present

## 2015-04-13 DIAGNOSIS — D649 Anemia, unspecified: Secondary | ICD-10-CM

## 2015-04-13 DIAGNOSIS — M129 Arthropathy, unspecified: Secondary | ICD-10-CM | POA: Insufficient documentation

## 2015-04-13 DIAGNOSIS — Z79899 Other long term (current) drug therapy: Secondary | ICD-10-CM | POA: Diagnosis not present

## 2015-04-13 DIAGNOSIS — M818 Other osteoporosis without current pathological fracture: Secondary | ICD-10-CM | POA: Insufficient documentation

## 2015-04-13 DIAGNOSIS — R531 Weakness: Secondary | ICD-10-CM | POA: Diagnosis not present

## 2015-04-13 DIAGNOSIS — Z7982 Long term (current) use of aspirin: Secondary | ICD-10-CM | POA: Diagnosis not present

## 2015-04-13 LAB — FERRITIN: FERRITIN: 279 ng/mL (ref 11–307)

## 2015-04-13 LAB — FOLATE: Folate: 28 ng/mL (ref >5.9)

## 2015-04-13 LAB — CBC
HEMATOCRIT: 27.4 % — AB (ref 35.0–47.0)
HEMOGLOBIN: 9.2 g/dL — AB (ref 12.0–16.0)
MCH: 30.2 pg (ref 26.0–34.0)
MCHC: 33.6 g/dL (ref 32.0–36.0)
MCV: 89.9 fL (ref 80.0–100.0)
Platelets: 134 10*3/uL — ABNORMAL LOW (ref 150–440)
RBC: 3.05 MIL/uL — ABNORMAL LOW (ref 3.80–5.20)
RDW: 15.1 % — ABNORMAL HIGH (ref 11.5–14.5)
WBC: 2.8 10*3/uL — ABNORMAL LOW (ref 3.6–11.0)

## 2015-04-13 LAB — IRON AND TIBC
IRON: 115 ug/dL (ref 28–170)
SATURATION RATIOS: 29 % (ref 10.4–31.8)
TIBC: 393 ug/dL (ref 250–450)
UIBC: 278 ug/dL

## 2015-04-13 LAB — LACTATE DEHYDROGENASE: LDH: 175 U/L (ref 98–192)

## 2015-04-13 NOTE — Progress Notes (Signed)
Patient here today for follow up regarding anemia. Patient has not been seen since 2015, reports labs drawn recently at PCP with low hgb. Patient reports she is having increasing weakness.

## 2015-04-14 LAB — VITAMIN B12: VITAMIN B 12: 539 pg/mL (ref 180–914)

## 2015-04-20 ENCOUNTER — Inpatient Hospital Stay: Payer: 59 | Attending: Oncology

## 2015-04-20 VITALS — BP 162/78 | HR 61 | Resp 20

## 2015-04-20 DIAGNOSIS — Z79899 Other long term (current) drug therapy: Secondary | ICD-10-CM | POA: Insufficient documentation

## 2015-04-20 DIAGNOSIS — D649 Anemia, unspecified: Secondary | ICD-10-CM | POA: Insufficient documentation

## 2015-04-20 MED ORDER — EPOETIN ALFA 40000 UNIT/ML IJ SOLN
40000.0000 [IU] | Freq: Once | INTRAMUSCULAR | Status: AC
  Administered 2015-04-20: 40000 [IU] via SUBCUTANEOUS

## 2015-04-20 NOTE — Progress Notes (Unsigned)
Blood pressure taken several times and the last reading is 162/78.  Rhett BannisterLeslie Herring, NP gave the okay for patient to have her procrit as ordered.

## 2015-04-25 NOTE — Progress Notes (Signed)
[place]  [phone] [phone]  ID: JAZMINE HECKMAN OB: March 01, 1945  MR#: [id number]  TZG#:[id number]  No care team member to display  CHIEF COMPLAINT:  Chief Complaint  Patient presents with  . Anemia    INTERVAL HISTORY: Patient is a 70 year old female whose last evaluated in clinic by Dr. Inez Pilgrim in 2015. Recent laboratory work by her primary care physician reveal declining hemoglobin. Patient reports increased weakness and fatigue, but otherwise feels well. She has no neurologic complaints. She denies any recent fevers. She has a good appetite and denies weight loss. She has no chest pain or shortness of breath. She denies any nausea, vomiting, constipation, or diarrhea. She has no melena or hematochezia. She has no urinary complaints. Patient otherwise feels well and offers no further specific complaints.  REVIEW OF SYSTEMS:   Review of Systems  Constitutional: Positive for malaise/fatigue. Negative for fever and weight loss.  Respiratory: Negative.  Negative for sputum production.   Cardiovascular: Negative.  Negative for chest pain.  Gastrointestinal: Negative.  Negative for blood in stool and melena.  Neurological: Positive for weakness.    As per HPI. Otherwise, a complete review of systems is negatve.  PAST MEDICAL HISTORY: Past Medical History  Diagnosis Date  . Anemia   . Hypertension   . Arthritis   . Osteoporosis     PAST SURGICAL HISTORY: Past Surgical History  Procedure Laterality Date  . Abdominal hysterectomy      partial  . Back surgery      FAMILY HISTORY No family history on file.     ADVANCED DIRECTIVES:    HEALTH MAINTENANCE: Social History  Substance Use Topics  . Smoking status: Not on file  . Smokeless tobacco: Not on file  . Alcohol Use: Not on file     Colonoscopy:  PAP:  Bone density:  Lipid panel:  Not on File  Current Outpatient Prescriptions  Medication Sig Dispense Refill    . albuterol-ipratropium (COMBIVENT) 18-103 MCG/ACT inhaler Inhale 2 puffs into the lungs every 6 (six) hours as needed for wheezing or shortness of breath.    Marland Kitchen aspirin 81 MG tablet Take 81 mg by mouth daily.    Marland Kitchen etodolac (LODINE) 400 MG tablet Take 400 mg by mouth 2 (two) times daily.    . Ferrous Sulfate (SLOW FE) 142 (45 Fe) MG TBCR Take 1 tablet by mouth daily.    . fexofenadine (ALLEGRA) 180 MG tablet Take 180 mg by mouth daily.    . Fluticasone-Salmeterol (ADVAIR) 250-50 MCG/DOSE AEPB Inhale 1 puff into the lungs 2 (two) times daily.    Marland Kitchen lisinopril (PRINIVIL,ZESTRIL) 20 MG tablet Take 20 mg by mouth daily.    . meclizine (ANTIVERT) 25 MG tablet Take 25 mg by mouth 2 (two) times daily as needed for dizziness.    . Multiple Vitamin (MULTIVITAMIN) tablet Take 1 tablet by mouth daily.    Marland Kitchen amLODipine (NORVASC) 10 MG tablet   3  . Choline Fenofibrate (FENOFIBRIC ACID) 135 MG CPDR   3  . morphine (MS CONTIN) 60 MG 12 hr tablet   0  . ondansetron (ZOFRAN) 4 MG tablet   4  . pantoprazole (PROTONIX) 40 MG tablet   3  . tiZANidine (ZANAFLEX) 4 MG tablet   4   No current facility-administered medications for this visit.    OBJECTIVE: Filed Vitals:   04/13/15 1108  BP: 149/63  Pulse: 70  Temp: 98.3 F (36.8 C)  Resp: 20  There is no height on file to calculate BMI.    ECOG FS:0 - Asymptomatic  General: Well-developed, well-nourished, no acute distress. Eyes: Pink conjunctiva, anicteric sclera. HEENT: Normocephalic, moist mucous membranes, clear oropharnyx. Lungs: Clear to auscultation bilaterally. Heart: Regular rate and rhythm. No rubs, murmurs, or gallops. Abdomen: Soft, nontender, nondistended. No organomegaly noted, normoactive bowel sounds. Musculoskeletal: No edema, cyanosis, or clubbing. Neuro: Alert, answering all questions appropriately. Cranial nerves grossly intact. Skin: No rashes or petechiae noted. Psych: Normal affect. Lymphatics: No cervical, calvicular,  axillary or inguinal LAD.   LAB RESULTS:  Lab Results  Component Value Date   NA 139 04/19/2011   K 3.7 05/04/2013   CL 102 04/19/2011   CO2 21 04/19/2011   GLUCOSE 128* 04/19/2011   BUN 15 04/19/2011   CREATININE 1.32* 01/06/2013   CALCIUM 8.9 04/19/2011   PROT 6.7 01/06/2013   ALBUMIN 3.6 01/06/2013   AST 27 01/06/2013   ALT 22 01/06/2013   ALKPHOS 69 01/06/2013   BILITOT 0.6 01/25/2013   GFRNONAA 42* 01/06/2013   GFRAA 49* 01/06/2013    Lab Results  Component Value Date   WBC 2.8* 04/13/2015   NEUTROABS 1.7 03/29/2013   HGB 9.2* 04/13/2015   HCT 27.4* 04/13/2015   MCV 89.9 04/13/2015   PLT 134* 04/13/2015   Lab Results  Component Value Date   FERRITIN 279 04/13/2015   Lab Results  Component Value Date   IRON 115 04/13/2015   TIBC 393 04/13/2015   IRONPCTSAT 29 04/13/2015      STUDIES: No results found.  ASSESSMENT: Pancytopenia.  PLAN:    1. Pancytopenia: Extensive workup by Dr. Inez Pilgrim in 2014-15 did not reveal distinct etiology. A bone marrow biopsy was discussed for concern of MDS, but was determined not to be necessary and patient is refusing. Given her mild renal insufficiency, patient will likely benefit from Procrit. She previously responded to Procrit injections. Return to clinic in 1 week for 40,000 units of subcutaneous Procrit. Patient will then return to clinic every 4 weeks for laboratory work and Procrit if her hemoglobin is less than 10.0. She will then follow-up in 4 months with repeat laboratory work and further evaluation.  Approximately 30 minutes was spent in discussion of which greater than 50% was consultation.  Patient expressed understanding and was in agreement with this plan. She also understands that She can call clinic at any time with any questions, concerns, or complaints.   Lloyd Huger, MD   04/25/2015 4:21 PM

## 2015-05-18 ENCOUNTER — Ambulatory Visit: Payer: 59

## 2015-05-18 ENCOUNTER — Other Ambulatory Visit: Payer: 59

## 2015-05-25 ENCOUNTER — Inpatient Hospital Stay: Payer: 59 | Attending: Oncology

## 2015-05-25 ENCOUNTER — Inpatient Hospital Stay: Payer: 59

## 2015-05-25 DIAGNOSIS — D649 Anemia, unspecified: Secondary | ICD-10-CM | POA: Diagnosis not present

## 2015-05-25 LAB — HEMOGLOBIN: Hemoglobin: 10.2 g/dL — ABNORMAL LOW (ref 12.0–16.0)

## 2015-06-22 ENCOUNTER — Inpatient Hospital Stay: Payer: 59

## 2015-06-22 ENCOUNTER — Inpatient Hospital Stay: Payer: 59 | Attending: Oncology

## 2015-06-26 ENCOUNTER — Ambulatory Visit (INDEPENDENT_AMBULATORY_CARE_PROVIDER_SITE_OTHER): Payer: 59 | Admitting: Obstetrics and Gynecology

## 2015-06-26 ENCOUNTER — Encounter: Payer: Self-pay | Admitting: Obstetrics and Gynecology

## 2015-06-26 VITALS — BP 137/67 | HR 80 | Ht 67.0 in | Wt 146.4 lb

## 2015-06-26 DIAGNOSIS — R300 Dysuria: Secondary | ICD-10-CM | POA: Diagnosis not present

## 2015-06-26 DIAGNOSIS — D649 Anemia, unspecified: Secondary | ICD-10-CM | POA: Diagnosis not present

## 2015-06-26 DIAGNOSIS — Z90710 Acquired absence of both cervix and uterus: Secondary | ICD-10-CM | POA: Diagnosis not present

## 2015-06-26 DIAGNOSIS — I10 Essential (primary) hypertension: Secondary | ICD-10-CM | POA: Diagnosis not present

## 2015-06-26 DIAGNOSIS — N952 Postmenopausal atrophic vaginitis: Secondary | ICD-10-CM | POA: Insufficient documentation

## 2015-06-26 DIAGNOSIS — R319 Hematuria, unspecified: Secondary | ICD-10-CM | POA: Diagnosis not present

## 2015-06-26 DIAGNOSIS — N9489 Other specified conditions associated with female genital organs and menstrual cycle: Secondary | ICD-10-CM | POA: Diagnosis not present

## 2015-06-26 DIAGNOSIS — E785 Hyperlipidemia, unspecified: Secondary | ICD-10-CM | POA: Diagnosis not present

## 2015-06-26 DIAGNOSIS — R102 Pelvic and perineal pain: Secondary | ICD-10-CM | POA: Insufficient documentation

## 2015-06-26 LAB — POCT URINALYSIS DIPSTICK
GLUCOSE UA: NEGATIVE
KETONES UA: NEGATIVE
Leukocytes, UA: NEGATIVE
Nitrite, UA: NEGATIVE
Protein, UA: NEGATIVE
SPEC GRAV UA: 1.015
Urobilinogen, UA: 0.2
pH, UA: 6

## 2015-06-26 MED ORDER — ESTROGENS, CONJUGATED 0.625 MG/GM VA CREA: TOPICAL_CREAM | VAGINAL | Status: DC

## 2015-06-26 NOTE — Progress Notes (Signed)
Patient ID: Alison Price, female   DOB: 1945-04-01, 70 y.o.   MRN: [id number] GYN ENCOUNTER NOTE  Subjective:       Alison Price is a 70 y.o. [id number] female is here for gynecologic evaluation of the following issues:  1. Pelvic pressure 2. Urinary leakage  Patient is a 70yo, G2P2, s/p TAH and posterior repair years ago, post-menopausal, asymptomatic not on HRT, presenting today with complaints of increased pelvic pressure over the last few months and an increase in urinary leakage beginning in the last few week.  Patient admits to one episode of urge incontinence, nocturia waking her every few hours nightly, and increased frequency of urination.  Denies vaginal discharge, abdominal or pelvic pain, change in BM, or stress incontinence.   Gynecologic History No LMP recorded. Patient has had a hysterectomy. Contraception: status post hysterectomy  Obstetric History OB History  Gravida Para Term Preterm AB SAB TAB Ectopic Multiple Living  2 2 2       2     # Outcome Date GA Lbr Len/2nd Weight Sex Delivery Anes PTL Lv  2 Term 1972    M Vag-Spont   Y  1 Term 1969    F Vag-Spont   Y      Past Medical History  Diagnosis Date  . Anemia   . Hypertension   . Arthritis   . Osteoporosis     Past Surgical History  Procedure Laterality Date  . Back surgery    . Abdominal hysterectomy      partial  . Foot surgery    . Hemorrhoid surgery    . Posterior repair    . Hand surgery      THUMB REPAIR    Current Outpatient Prescriptions on File Prior to Visit  Medication Sig Dispense Refill  . albuterol-ipratropium (COMBIVENT) 18-103 MCG/ACT inhaler Inhale 2 puffs into the lungs every 6 (six) hours as needed for wheezing or shortness of breath.    Marland Kitchen. amLODipine (NORVASC) 10 MG tablet   3  . aspirin 81 MG tablet Take 81 mg by mouth daily.    . Choline Fenofibrate (FENOFIBRIC ACID) 135 MG CPDR   3  . etodolac (LODINE) 400 MG tablet Take 400 mg by mouth 2 (two) times daily.    .  Ferrous Sulfate (SLOW FE) 142 (45 Fe) MG TBCR Take 1 tablet by mouth daily.    . fexofenadine (ALLEGRA) 180 MG tablet Take 180 mg by mouth daily.    . Fluticasone-Salmeterol (ADVAIR) 250-50 MCG/DOSE AEPB Inhale 1 puff into the lungs 2 (two) times daily.    Marland Kitchen. lisinopril (PRINIVIL,ZESTRIL) 20 MG tablet Take 20 mg by mouth daily.    . meclizine (ANTIVERT) 25 MG tablet Take 25 mg by mouth 2 (two) times daily as needed for dizziness.    Marland Kitchen. morphine (MS CONTIN) 60 MG 12 hr tablet   0  . Multiple Vitamin (MULTIVITAMIN) tablet Take 1 tablet by mouth daily.    . ondansetron (ZOFRAN) 4 MG tablet   4  . pantoprazole (PROTONIX) 40 MG tablet   3  . tiZANidine (ZANAFLEX) 4 MG tablet   4   No current facility-administered medications on file prior to visit.    Allergies  Allergen Reactions  . Sulfa Antibiotics Other (See Comments)    Trudie BucklerSTEVEN JOHNSON SYNDROME    Social History   Social History  . Marital Status: Divorced    Spouse Name: N/A  . Number of Children: N/A  . Years of  Education: N/A   Occupational History  . Not on file.   Social History Main Topics  . Smoking status: Never Smoker   . Smokeless tobacco: Not on file  . Alcohol Use: No  . Drug Use: No  . Sexual Activity: Not Currently    Birth Control/ Protection: Surgical   Other Topics Concern  . Not on file   Social History Narrative    Family History  Problem Relation Age of Onset  . Diabetes Paternal Grandmother   . Breast cancer Neg Hx   . Ovarian cancer Neg Hx   . Colon cancer Neg Hx   . Heart disease Neg Hx     The following portions of the patient's history were reviewed and updated as appropriate: allergies, current medications, past family history, past medical history, past social history, past surgical history and problem list.  Review of Systems Review of Systems - Negative except for those listed in HPI. Review of Systems - General ROS: negative for - chills, fatigue, fever, hot flashes, malaise or  night sweats Hematological and Lymphatic ROS: negative for - bleeding problems or swollen lymph nodes Gastrointestinal ROS: negative for - blood in stools, nausea/vomiting Musculoskeletal ROS: negative for - joint pain, muscle pain or muscular weakness Genito-Urinary ROS: negative for -  dyspareunia, dysuria, genital discharge, genital ulcers, hematuria.  Objective:   BP 137/67 mmHg  Pulse 80  Ht  (1.702 m)  Wt 146 lb 6.4 oz (66.407 kg)  BMI 22.92 kg/m2 CONSTITUTIONAL: Well-developed, well-nourished female in no acute distress.  HENT:  Normocephalic, atraumatic.  NECK: Normal range of motion, supple, no masses.  Normal thyroid.  SKIN: Skin is warm and dry. No rash noted. Not diaphoretic. No erythema. No pallor. NEUROLGIC: Alert and oriented to person, place, and time.  PSYCHIATRIC: Normal mood and affect. Normal behavior. Normal judgment and thought content. CARDIOVASCULAR: regular rate and rhythm, no murmur, rubs or gallops RESPIRATORY: clear to ausclutation bilaterally, even symmetric respirations ABDOMEN: Soft, non distended; Non tender.  No Organomegaly. PELVIC:  External Genitalia: mild atrophy, urethral caruncle   BUS: Normal  Vagina: moderate atrophy, decreased rugae, pale tissues.  Minimal 1st degree cystocele, good support at urethral vesicle junction. Rectocele mild  Cervix: surgically absent  Uterus: surgically absent  Adnexa: Normal  RV: Normal   Bladder: Nontender MUSCULOSKELETAL: Normal range of motion. No tenderness.  No cyanosis, clubbing, or edema.     Assessment:   1. Dysuria - POCT urinalysis dipstick 2. Hematuria - Urine culture 3. Urinary Urgency 4. Urinary Frequency 5. Vaginal Atrophy, symptomatic 6. Pelvic pressure; no evidence of significant vaginal vault prolapse   Plan:   1. Premarin cream 1g intravaginal twice a week for atrophy and urinary symptom relief. 2. UA and culture pending to rule out UTI.  Add antibiotics if positive  result. 3. Follow up in 3 months if symptoms persist, otherwise follow up with primary care provider.     Lindsay P. Penninger, PA-S Herold Harms, MD   I have seen, interviewed, and examined the patient in conjunction with the [facility].A. student and affirm the diagnosis and management plan. Martin A. DeFrancesco, MD, FACOG   Note: This dictation was prepared with Dragon dictation along with smaller phrase technology. Any transcriptional errors that result from this process are unintentional.

## 2015-06-26 NOTE — Patient Instructions (Signed)
No significant pelvic organ prolapse is identified Begin Premarin cream intravaginal twice a week for vaginal atrophy 3. Return in 3 months or as needed if symptoms persist 4. Urinalysis and culture is obtained to rule out bladder infection

## 2015-06-27 LAB — URINE CULTURE: Organism ID, Bacteria: NO GROWTH

## 2015-07-05 DIAGNOSIS — M25571 Pain in right ankle and joints of right foot: Secondary | ICD-10-CM | POA: Diagnosis not present

## 2015-07-05 DIAGNOSIS — M25579 Pain in unspecified ankle and joints of unspecified foot: Secondary | ICD-10-CM | POA: Diagnosis not present

## 2015-07-20 ENCOUNTER — Inpatient Hospital Stay: Payer: 59

## 2015-07-20 ENCOUNTER — Inpatient Hospital Stay: Payer: 59 | Attending: Oncology

## 2015-07-20 VITALS — BP 123/65 | HR 66 | Temp 97.9°F

## 2015-07-20 DIAGNOSIS — D61818 Other pancytopenia: Secondary | ICD-10-CM | POA: Insufficient documentation

## 2015-07-20 DIAGNOSIS — D649 Anemia, unspecified: Secondary | ICD-10-CM

## 2015-07-20 DIAGNOSIS — Z79899 Other long term (current) drug therapy: Secondary | ICD-10-CM | POA: Insufficient documentation

## 2015-07-20 LAB — HEMOGLOBIN: Hemoglobin: 9.9 g/dL — ABNORMAL LOW (ref 12.0–16.0)

## 2015-07-20 MED ORDER — EPOETIN ALFA 40000 UNIT/ML IJ SOLN
40000.0000 [IU] | Freq: Once | INTRAMUSCULAR | Status: AC
  Administered 2015-07-20: 40000 [IU] via SUBCUTANEOUS
  Filled 2015-07-20: qty 1

## 2015-07-26 DIAGNOSIS — S93431D Sprain of tibiofibular ligament of right ankle, subsequent encounter: Secondary | ICD-10-CM | POA: Diagnosis not present

## 2015-08-07 ENCOUNTER — Ambulatory Visit: Payer: 59 | Admitting: Physical Therapy

## 2015-08-13 ENCOUNTER — Encounter: Payer: 59 | Admitting: Physical Therapy

## 2015-08-16 ENCOUNTER — Other Ambulatory Visit: Payer: Self-pay | Admitting: Internal Medicine

## 2015-08-16 ENCOUNTER — Encounter: Payer: 59 | Admitting: Physical Therapy

## 2015-08-16 DIAGNOSIS — Z1231 Encounter for screening mammogram for malignant neoplasm of breast: Secondary | ICD-10-CM

## 2015-08-17 ENCOUNTER — Ambulatory Visit: Payer: 59 | Admitting: Oncology

## 2015-08-17 ENCOUNTER — Inpatient Hospital Stay: Payer: 59

## 2015-08-23 ENCOUNTER — Encounter: Payer: 59 | Admitting: Physical Therapy

## 2015-08-24 ENCOUNTER — Inpatient Hospital Stay (HOSPITAL_BASED_OUTPATIENT_CLINIC_OR_DEPARTMENT_OTHER): Payer: 59 | Admitting: Oncology

## 2015-08-24 ENCOUNTER — Inpatient Hospital Stay: Payer: 59 | Attending: Oncology

## 2015-08-24 ENCOUNTER — Inpatient Hospital Stay: Payer: 59

## 2015-08-24 VITALS — BP 166/75 | HR 73 | Temp 98.7°F | Resp 18 | Wt 147.8 lb

## 2015-08-24 DIAGNOSIS — D61818 Other pancytopenia: Secondary | ICD-10-CM

## 2015-08-24 DIAGNOSIS — I10 Essential (primary) hypertension: Secondary | ICD-10-CM | POA: Insufficient documentation

## 2015-08-24 DIAGNOSIS — Z79899 Other long term (current) drug therapy: Secondary | ICD-10-CM

## 2015-08-24 DIAGNOSIS — Z7982 Long term (current) use of aspirin: Secondary | ICD-10-CM

## 2015-08-24 DIAGNOSIS — M129 Arthropathy, unspecified: Secondary | ICD-10-CM | POA: Diagnosis not present

## 2015-08-24 DIAGNOSIS — D649 Anemia, unspecified: Secondary | ICD-10-CM

## 2015-08-24 DIAGNOSIS — M818 Other osteoporosis without current pathological fracture: Secondary | ICD-10-CM

## 2015-08-24 LAB — HEMOGLOBIN: HEMOGLOBIN: 10.6 g/dL — AB (ref 12.0–16.0)

## 2015-08-24 NOTE — Progress Notes (Signed)
States has had nasal congestion but is improving.

## 2015-08-24 NOTE — Progress Notes (Signed)
Alison Price  Telephone:(336) [phone] Fax:(336) [phone]  ID: Alison Price OB: Nov 14, 1945  MR#: [id number]  OYD#:[id number]  Patient Care Team: Albina Billet, MD as PCP - General (Internal Medicine)  CHIEF COMPLAINT: Pancytopenia, anemia.  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   INTERVAL HISTORY: Patient returns to clinic today for further evaluation, repeat laboratory work, and consideration of additional Procrit. She has no neurologic complaints. She denies any recent fevers. She has a good appetite and denies weight loss. She has no chest pain or shortness of breath. She denies any nausea, vomiting, constipation, or diarrhea. She has no melena or hematochezia. She has no urinary complaints. Patient offers no further specific complaints today.  REVIEW OF SYSTEMS:   Review of Systems  Constitutional: Negative for fever and weight loss.  Respiratory: Negative.  Negative for sputum production.   Cardiovascular: Negative.  Negative for chest pain.  Gastrointestinal: Negative.  Negative for abdominal pain, blood in stool and melena.  Musculoskeletal: Negative.   Neurological: Negative.  Negative for weakness.  Endo/Heme/Allergies: Does not bruise/bleed easily.  Psychiatric/Behavioral: Negative.  The patient is not nervous/anxious.     As per HPI. Otherwise, a complete review of systems is negatve.  PAST MEDICAL HISTORY: Past Medical History  Diagnosis Date  . Anemia   . Hypertension   . Arthritis   . Osteoporosis     PAST SURGICAL HISTORY: Past Surgical History  Procedure Laterality Date  . Back surgery    . Abdominal hysterectomy      partial  . Foot surgery    . Hemorrhoid surgery    . Posterior repair    . Hand surgery      THUMB REPAIR    FAMILY HISTORY Family History  Problem Relation Age of Onset  . Diabetes Paternal Grandmother   . Breast cancer Neg Hx   . Ovarian cancer Neg Hx   . Colon cancer Neg Hx   . Heart disease Neg Hx         ADVANCED DIRECTIVES:    HEALTH MAINTENANCE: Social History  Substance Use Topics  . Smoking status: Never Smoker   . Smokeless tobacco: Not on file  . Alcohol Use: No     Colonoscopy:  PAP:  Bone density:  Lipid panel:  Allergies  Allergen Reactions  . Sulfa Antibiotics Other (See Comments)    STEVEN JOHNSON SYNDROME    Current Outpatient Prescriptions  Medication Sig Dispense Refill  . albuterol-ipratropium (COMBIVENT) 18-103 MCG/ACT inhaler Inhale 2 puffs into the lungs every 6 (six) hours as needed for wheezing or shortness of breath.    Marland Kitchen amLODipine (NORVASC) 10 MG tablet   3  . aspirin 81 MG tablet Take 81 mg by mouth daily.    . Butalbital-APAP-Caffeine 50-300-40 MG CAPS   3  . Choline Fenofibrate (FENOFIBRIC ACID) 135 MG CPDR   3  . conjugated estrogens (PREMARIN) vaginal cream 1 gram intravaginal BIW 60 g 1  . etodolac (LODINE) 400 MG tablet Take 400 mg by mouth 2 (two) times daily.    . Ferrous Sulfate (SLOW FE) 142 (45 Fe) MG TBCR Take 1 tablet by mouth daily.    . fexofenadine (ALLEGRA) 180 MG tablet Take 180 mg by mouth daily.    . Fluticasone-Salmeterol (ADVAIR) 250-50 MCG/DOSE AEPB Inhale 1 puff into the lungs 2 (two) times daily.    Marland Kitchen lisinopril (PRINIVIL,ZESTRIL) 20 MG tablet Take 20 mg by mouth daily.    . meclizine (ANTIVERT) 25 MG tablet Take  25 mg by mouth 2 (two) times daily as needed for dizziness.    Marland Kitchen morphine (MS CONTIN) 60 MG 12 hr tablet   0  . Multiple Vitamin (MULTIVITAMIN) tablet Take 1 tablet by mouth daily.    . ondansetron (ZOFRAN) 4 MG tablet   4  . Oxycodone HCl 20 MG TABS   0  . pantoprazole (PROTONIX) 40 MG tablet   3  . tiZANidine (ZANAFLEX) 4 MG tablet   4   No current facility-administered medications for this visit.    OBJECTIVE: Filed Vitals:   08/24/15 1133  BP: 166/75  Pulse: 73  Temp: 98.7 F (37.1 C)  Resp: 18     Body mass index is 23.15 kg/(m^2).    ECOG FS:0 - Asymptomatic  General: Well-developed,  well-nourished, no acute distress. Eyes: Pink conjunctiva, anicteric sclera. Lungs: Clear to auscultation bilaterally. Heart: Regular rate and rhythm. No rubs, murmurs, or gallops. Abdomen: Soft, nontender, nondistended. No organomegaly noted, normoactive bowel sounds. Musculoskeletal: No edema, cyanosis, or clubbing. Neuro: Alert, answering all questions appropriately. Cranial nerves grossly intact. Skin: No rashes or petechiae noted. Psych: Normal affect.   LAB RESULTS:  Lab Results  Component Value Date   NA 139 04/19/2011   K 3.7 05/04/2013   CL 102 04/19/2011   CO2 21 04/19/2011   GLUCOSE 128* 04/19/2011   BUN 15 04/19/2011   CREATININE 1.32* 01/06/2013   CALCIUM 8.9 04/19/2011   PROT 6.7 01/06/2013   ALBUMIN 3.6 01/06/2013   AST 27 01/06/2013   ALT 22 01/06/2013   ALKPHOS 69 01/06/2013   BILITOT 0.6 01/25/2013   GFRNONAA 42* 01/06/2013   GFRAA 49* 01/06/2013    Lab Results  Component Value Date   WBC 2.8* 04/13/2015   NEUTROABS 1.7 03/29/2013   HGB 10.6* 08/24/2015   HCT 27.4* 04/13/2015   MCV 89.9 04/13/2015   PLT 134* 04/13/2015   Lab Results  Component Value Date   FERRITIN 279 04/13/2015   Lab Results  Component Value Date   IRON 115 04/13/2015   TIBC 393 04/13/2015   IRONPCTSAT 29 04/13/2015      STUDIES: No results found.  ASSESSMENT: Pancytopenia, anemia.  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  PLAN:    1. Pancytopenia: Extensive workup in 2014-15 did not reveal distinct etiology.  A bone marrow biopsy was discussed for concern of MDS, but was determined not to be necessary and patient is refusing. Given her mild renal insufficiency, patient will likely benefit from Procrit. Patient last received Procrit in June 2017. Patient's iron stores are within normal limits. Given patient's hemoglobin is greater than 10.0 today, she does not require Procrit. Return to clinic in 3 months for laboratory work and consideration of Procrit if her hemoglobin  falls below 10.0. She will then return to clinic in 6 months for further evaluation.  2. Anemia:  Procrit as above. Repeat iron stores with next blood draw.  Patient expressed understanding and was in agreement with this plan. She also understands that She can call clinic at any time with any questions, concerns, or complaints.   Lloyd Huger, MD   08/24/2015 2:20 PM

## 2015-08-28 ENCOUNTER — Encounter: Payer: 59 | Admitting: Physical Therapy

## 2015-08-30 ENCOUNTER — Ambulatory Visit: Payer: 59 | Admitting: Physical Therapy

## 2015-09-07 ENCOUNTER — Other Ambulatory Visit: Payer: Self-pay | Admitting: Internal Medicine

## 2015-09-07 ENCOUNTER — Ambulatory Visit
Admission: RE | Admit: 2015-09-07 | Discharge: 2015-09-07 | Disposition: A | Discharge status: Home / Self Care | Payer: 59 | Source: Ambulatory Visit | Attending: Internal Medicine | Admitting: Internal Medicine

## 2015-09-07 DIAGNOSIS — Z1231 Encounter for screening mammogram for malignant neoplasm of breast: Secondary | ICD-10-CM | POA: Insufficient documentation

## 2015-09-14 DIAGNOSIS — H16223 Keratoconjunctivitis sicca, not specified as Sjogren's, bilateral: Secondary | ICD-10-CM | POA: Diagnosis not present

## 2015-10-04 DIAGNOSIS — E785 Hyperlipidemia, unspecified: Secondary | ICD-10-CM | POA: Diagnosis not present

## 2015-10-04 DIAGNOSIS — I10 Essential (primary) hypertension: Secondary | ICD-10-CM | POA: Diagnosis not present

## 2015-10-05 DIAGNOSIS — I10 Essential (primary) hypertension: Secondary | ICD-10-CM | POA: Diagnosis not present

## 2015-10-05 DIAGNOSIS — M545 Low back pain: Secondary | ICD-10-CM | POA: Diagnosis not present

## 2015-10-05 DIAGNOSIS — E785 Hyperlipidemia, unspecified: Secondary | ICD-10-CM | POA: Diagnosis not present

## 2015-10-08 ENCOUNTER — Other Ambulatory Visit: Payer: Self-pay | Admitting: Internal Medicine

## 2015-10-08 DIAGNOSIS — R0989 Other specified symptoms and signs involving the circulatory and respiratory systems: Secondary | ICD-10-CM

## 2015-10-12 ENCOUNTER — Ambulatory Visit
Admission: RE | Admit: 2015-10-12 | Discharge: 2015-10-12 | Disposition: A | Discharge status: Home / Self Care | Payer: 59 | Source: Ambulatory Visit | Attending: Internal Medicine | Admitting: Internal Medicine

## 2015-10-12 DIAGNOSIS — R0989 Other specified symptoms and signs involving the circulatory and respiratory systems: Secondary | ICD-10-CM | POA: Diagnosis not present

## 2015-10-12 DIAGNOSIS — I6523 Occlusion and stenosis of bilateral carotid arteries: Secondary | ICD-10-CM | POA: Insufficient documentation

## 2015-10-19 DIAGNOSIS — R601 Generalized edema: Secondary | ICD-10-CM | POA: Diagnosis not present

## 2015-10-19 DIAGNOSIS — R609 Edema, unspecified: Secondary | ICD-10-CM | POA: Diagnosis not present

## 2015-10-19 DIAGNOSIS — I10 Essential (primary) hypertension: Secondary | ICD-10-CM | POA: Diagnosis not present

## 2015-11-08 ENCOUNTER — Ambulatory Visit
Admission: RE | Admit: 2015-11-08 | Discharge: 2015-11-08 | Disposition: A | Discharge status: Home / Self Care | Payer: 59 | Source: Ambulatory Visit | Attending: Internal Medicine | Admitting: Internal Medicine

## 2015-11-08 ENCOUNTER — Ambulatory Visit: Admission: RE | Admit: 2015-11-08 | Payer: 59 | Source: Ambulatory Visit | Admitting: *Deleted

## 2015-11-08 ENCOUNTER — Other Ambulatory Visit: Payer: Self-pay | Admitting: Internal Medicine

## 2015-11-08 DIAGNOSIS — X58XXXA Exposure to other specified factors, initial encounter: Secondary | ICD-10-CM | POA: Diagnosis not present

## 2015-11-08 DIAGNOSIS — S299XXA Unspecified injury of thorax, initial encounter: Secondary | ICD-10-CM

## 2015-11-08 DIAGNOSIS — R0781 Pleurodynia: Secondary | ICD-10-CM | POA: Insufficient documentation

## 2015-11-08 DIAGNOSIS — R071 Chest pain on breathing: Secondary | ICD-10-CM | POA: Diagnosis not present

## 2015-11-08 DIAGNOSIS — J984 Other disorders of lung: Secondary | ICD-10-CM | POA: Diagnosis not present

## 2015-11-08 DIAGNOSIS — S298XXA Other specified injuries of thorax, initial encounter: Secondary | ICD-10-CM | POA: Diagnosis present

## 2015-11-09 DIAGNOSIS — S20212A Contusion of left front wall of thorax, initial encounter: Secondary | ICD-10-CM | POA: Diagnosis not present

## 2015-11-22 ENCOUNTER — Other Ambulatory Visit: Payer: Self-pay | Admitting: *Deleted

## 2015-11-22 DIAGNOSIS — D649 Anemia, unspecified: Secondary | ICD-10-CM

## 2015-11-23 ENCOUNTER — Inpatient Hospital Stay: Payer: 59

## 2015-11-23 ENCOUNTER — Inpatient Hospital Stay: Payer: 59 | Attending: Oncology

## 2015-11-23 VITALS — BP 146/74 | HR 76 | Resp 18

## 2015-11-23 DIAGNOSIS — D61818 Other pancytopenia: Secondary | ICD-10-CM | POA: Diagnosis not present

## 2015-11-23 DIAGNOSIS — Z79899 Other long term (current) drug therapy: Secondary | ICD-10-CM | POA: Insufficient documentation

## 2015-11-23 DIAGNOSIS — D649 Anemia, unspecified: Secondary | ICD-10-CM

## 2015-11-23 LAB — IRON AND TIBC
Iron: 68 ug/dL (ref 28–170)
Saturation Ratios: 22 % (ref 10.4–31.8)
TIBC: 316 ug/dL (ref 250–450)
UIBC: 248 ug/dL

## 2015-11-23 LAB — FERRITIN: Ferritin: 290 ng/mL (ref 11–307)

## 2015-11-23 LAB — HEMOGLOBIN: HEMOGLOBIN: 9.5 g/dL — AB (ref 12.0–16.0)

## 2015-11-23 MED ORDER — EPOETIN ALFA 40000 UNIT/ML IJ SOLN
40000.0000 [IU] | Freq: Once | INTRAMUSCULAR | Status: AC
  Administered 2015-11-23: 40000 [IU] via SUBCUTANEOUS

## 2015-11-23 NOTE — Progress Notes (Signed)
Procrit was administered before BP was available. Pharmacy will verify this order as medication already administered.

## 2015-12-03 ENCOUNTER — Encounter: Payer: Self-pay | Admitting: Emergency Medicine

## 2015-12-03 ENCOUNTER — Other Ambulatory Visit: Payer: Self-pay

## 2015-12-03 ENCOUNTER — Emergency Department: Payer: 59

## 2015-12-03 ENCOUNTER — Emergency Department
Admission: EM | Admit: 2015-12-03 | Discharge: 2015-12-03 | Disposition: A | Discharge status: Home / Self Care | Payer: 59 | Attending: Emergency Medicine | Admitting: Emergency Medicine

## 2015-12-03 DIAGNOSIS — W08XXXA Fall from other furniture, initial encounter: Secondary | ICD-10-CM | POA: Diagnosis not present

## 2015-12-03 DIAGNOSIS — Z79891 Long term (current) use of opiate analgesic: Secondary | ICD-10-CM | POA: Diagnosis not present

## 2015-12-03 DIAGNOSIS — Z7982 Long term (current) use of aspirin: Secondary | ICD-10-CM | POA: Insufficient documentation

## 2015-12-03 DIAGNOSIS — Z79899 Other long term (current) drug therapy: Secondary | ICD-10-CM | POA: Diagnosis not present

## 2015-12-03 DIAGNOSIS — S20211A Contusion of right front wall of thorax, initial encounter: Secondary | ICD-10-CM | POA: Insufficient documentation

## 2015-12-03 DIAGNOSIS — Y999 Unspecified external cause status: Secondary | ICD-10-CM | POA: Diagnosis not present

## 2015-12-03 DIAGNOSIS — Y9389 Activity, other specified: Secondary | ICD-10-CM | POA: Diagnosis not present

## 2015-12-03 DIAGNOSIS — Y92000 Kitchen of unspecified non-institutional (private) residence as  the place of occurrence of the external cause: Secondary | ICD-10-CM | POA: Diagnosis not present

## 2015-12-03 DIAGNOSIS — I10 Essential (primary) hypertension: Secondary | ICD-10-CM | POA: Diagnosis not present

## 2015-12-03 DIAGNOSIS — R079 Chest pain, unspecified: Secondary | ICD-10-CM | POA: Diagnosis not present

## 2015-12-03 DIAGNOSIS — S299XXA Unspecified injury of thorax, initial encounter: Secondary | ICD-10-CM | POA: Diagnosis not present

## 2015-12-03 MED ORDER — OXYCODONE HCL 5 MG PO TABS
5.0000 mg | ORAL_TABLET | Freq: Once | ORAL | Status: AC
  Administered 2015-12-03: 5 mg via ORAL
  Filled 2015-12-03: qty 1

## 2015-12-03 MED ORDER — ONDANSETRON 4 MG PO TBDP
4.0000 mg | ORAL_TABLET | Freq: Once | ORAL | Status: AC
  Administered 2015-12-03: 4 mg via ORAL
  Filled 2015-12-03: qty 1

## 2015-12-03 NOTE — ED Triage Notes (Signed)
Pt from home after falling in her kitchen and hit her ribcage on the sink. Pt states she is having some sob and is in obvious pain. Pt alert & oriented.

## 2015-12-03 NOTE — Discharge Instructions (Signed)
Please seek medical attention for any high fevers, chest pain, shortness of breath, change in behavior, persistent vomiting, bloody stool or any other new or concerning symptoms.  

## 2015-12-03 NOTE — ED Provider Notes (Signed)
[facility] Emergency Department Provider Note   ____________________________________________   I have reviewed the triage vital signs and the nursing notes.   HISTORY  Chief Complaint Shortness of Breath and Chest Pain   History limited by: Not Limited   HPI Alison Price is a 70 y.o. female who comes in because of chest pain. Located in the right lower chest. Occurred this morning after the patient fell onto it when she was standing on a stool trying to close a window. The pain is sharp. It is worse with movements or deep breaths. She denies any other injury.   Past Medical History:  Diagnosis Date  . Anemia   . Arthritis   . Hypertension   . Osteoporosis     Patient Active Problem List   Diagnosis Date Noted  . History of total abdominal hysterectomy 06/26/2015  . Pelvic pressure in female 06/26/2015  . Vaginal atrophy 06/26/2015  . Hematuria 06/26/2015  . Dysuria 06/26/2015  . Pancytopenia (HCC) 04/13/2015    Past Surgical History:  Procedure Laterality Date  . ABDOMINAL HYSTERECTOMY     partial  . BACK SURGERY    . FOOT SURGERY    . HAND SURGERY     THUMB REPAIR  . HEMORRHOID SURGERY    . POSTERIOR REPAIR      Prior to Admission medications   Medication Sig Start Date End Date Taking? Authorizing Provider  albuterol-ipratropium (COMBIVENT) 18-103 MCG/ACT inhaler Inhale 2 puffs into the lungs every 6 (six) hours as needed for wheezing or shortness of breath.    Historical Provider, MD  amLODipine (NORVASC) 10 MG tablet  04/09/15   Historical Provider, MD  aspirin 81 MG tablet Take 81 mg by mouth daily.    Historical Provider, MD  Butalbital-APAP-Caffeine 50-300-40 MG CAPS  06/22/15   Historical Provider, MD  Choline Fenofibrate (FENOFIBRIC ACID) 135 MG CPDR  04/09/15   Historical Provider, MD  conjugated estrogens (PREMARIN) vaginal cream 1 gram intravaginal BIW 06/26/15   Prentice Docker Defrancesco, MD  etodolac (LODINE) 400 MG tablet  Take 400 mg by mouth 2 (two) times daily.    Historical Provider, MD  Ferrous Sulfate (SLOW FE) 142 (45 Fe) MG TBCR Take 1 tablet by mouth daily.    Historical Provider, MD  fexofenadine (ALLEGRA) 180 MG tablet Take 180 mg by mouth daily.    Historical Provider, MD  Fluticasone-Salmeterol (ADVAIR) 250-50 MCG/DOSE AEPB Inhale 1 puff into the lungs 2 (two) times daily.    Historical Provider, MD  lisinopril (PRINIVIL,ZESTRIL) 20 MG tablet Take 20 mg by mouth daily.    Historical Provider, MD  meclizine (ANTIVERT) 25 MG tablet Take 25 mg by mouth 2 (two) times daily as needed for dizziness.    Historical Provider, MD  morphine (MS CONTIN) 60 MG 12 hr tablet  03/15/15   Historical Provider, MD  Multiple Vitamin (MULTIVITAMIN) tablet Take 1 tablet by mouth daily.    Historical Provider, MD  ondansetron (ZOFRAN) 4 MG tablet  02/21/15   Historical Provider, MD  Oxycodone HCl 20 MG TABS  06/08/15   Historical Provider, MD  pantoprazole (PROTONIX) 40 MG tablet  02/21/15   Historical Provider, MD  tiZANidine (ZANAFLEX) 4 MG tablet  01/17/15   Historical Provider, MD    Allergies Sulfa antibiotics  Family History  Problem Relation Age of Onset  . Diabetes Paternal Grandmother   . Uterine cancer Maternal Aunt   . Stomach cancer Maternal Aunt   . Breast cancer  Neg Hx   . Ovarian cancer Neg Hx   . Colon cancer Neg Hx   . Heart disease Neg Hx     Social History Social History  Substance Use Topics  . Smoking status: Never Smoker  . Smokeless tobacco: Never Used  . Alcohol use No    Review of Systems  Constitutional: Negative for fever. Cardiovascular: Positive for right lower chest pain. Respiratory: Negative for shortness of breath. Gastrointestinal: Negative for abdominal pain, vomiting and diarrhea. Genitourinary: Negative for dysuria. Musculoskeletal: Positive for chronic back pain Neurological: Negative for headaches, focal weakness or numbness.  10-point ROS otherwise  negative.  ____________________________________________   PHYSICAL EXAM:  VITAL SIGNS: ED Triage Vitals  Enc Vitals Group     BP 12/03/15 0942 (!) 163/81     Pulse Rate 12/03/15 0942 80     Resp 12/03/15 0942 (!) 22     Temp 12/03/15 0942 99.3 F (37.4 C)     Temp Source 12/03/15 0942 Oral     SpO2 12/03/15 0942 100 %     Weight 12/03/15 0942 150 lb (68 kg)     Height 12/03/15 0942 5\' 7"  (1.702 m)     Head Circumference --      Peak Flow --      Pain Score 12/03/15 1043 8   Constitutional: Alert and oriented. Well appearing and in no distress. Eyes: Conjunctivae are normal. Normal extraocular movements. ENT   Head: Normocephalic and atraumatic.   Nose: No congestion/rhinnorhea.   Mouth/Throat: Mucous membranes are moist.   Neck: No stridor. Hematological/Lymphatic/Immunilogical: No cervical lymphadenopathy. Cardiovascular: Normal rate, regular rhythm.  No murmurs, rubs, or gallops. Respiratory: Normal respiratory effort without tachypnea nor retractions. Breath sounds are clear and equal bilaterally. No wheezes/rales/rhonchi. Gastrointestinal: Soft and nontender. No distention.  Genitourinary: Deferred Musculoskeletal: Normal range of motion in all extremities. Tender to palpation over the right lower ribs. Neurologic:  Normal speech and language. No gross focal neurologic deficits are appreciated.  Skin:  Skin is warm, dry and intact. Ecchymosis noted over right lower ribs.  Psychiatric: Mood and affect are normal. Speech and behavior are normal. Patient exhibits appropriate insight and judgment.  ____________________________________________    LABS (pertinent positives/negatives)  None  ____________________________________________   EKG  I, Phineas Semen, attending physician, personally viewed and interpreted this EKG  EKG Time: 0951 Rate: 83 Rhythm: sinus rhythm PVC Axis: normal Intervals: qtc 467 QRS: narrow ST changes: no st  elevation Impression: abnormal ekg   ____________________________________________    RADIOLOGY  CXR IMPRESSION:  Stable bibasilar interstitial prominence, likely scarring. No edema  or consolidation. Stable cardiac silhouette. Stable anterior wedging  of mid thoracic vertebral bodies with increase in kyphosis. No  pneumothorax. No acute fracture evident.     I, Shateka Petrea, personally viewed and evaluated these images (plain radiographs) as part of my medical decision making. ____________________________________________   PROCEDURES  Procedures  ____________________________________________   INITIAL IMPRESSION / ASSESSMENT AND PLAN / ED COURSE  Pertinent labs & imaging results that were available during my care of the patient were reviewed by me and considered in my medical decision making (see chart for details).  Patient with pain to right lower chest after falling onto it. X-ray without any fracture or PTX. PE with some ecchymosis and tenderness over that area. Will give incentive spirometry. Patient states she has pain medication at home due to chronic pain issues.  ____________________________________________   FINAL CLINICAL IMPRESSION(S) / ED DIAGNOSES  Final diagnoses:  Contusion of rib on right side, initial encounter     Note: This dictation was prepared with Dragon dictation. Any transcriptional errors that result from this process are unintentional    Phineas SemenGraydon Sallie Staron, MD 12/03/15 1146

## 2015-12-05 DIAGNOSIS — Z1159 Encounter for screening for other viral diseases: Secondary | ICD-10-CM | POA: Diagnosis not present

## 2015-12-05 DIAGNOSIS — R071 Chest pain on breathing: Secondary | ICD-10-CM | POA: Diagnosis not present

## 2015-12-11 ENCOUNTER — Ambulatory Visit
Admission: RE | Admit: 2015-12-11 | Discharge: 2015-12-11 | Disposition: A | Discharge status: Home / Self Care | Payer: 59 | Source: Ambulatory Visit | Attending: Internal Medicine | Admitting: Internal Medicine

## 2015-12-11 ENCOUNTER — Other Ambulatory Visit: Payer: Self-pay | Admitting: Internal Medicine

## 2015-12-11 DIAGNOSIS — R071 Chest pain on breathing: Secondary | ICD-10-CM | POA: Diagnosis not present

## 2015-12-11 DIAGNOSIS — R0782 Intercostal pain: Secondary | ICD-10-CM

## 2015-12-11 DIAGNOSIS — R079 Chest pain, unspecified: Secondary | ICD-10-CM | POA: Diagnosis not present

## 2015-12-18 DIAGNOSIS — R071 Chest pain on breathing: Secondary | ICD-10-CM | POA: Diagnosis not present

## 2016-02-06 DIAGNOSIS — J209 Acute bronchitis, unspecified: Secondary | ICD-10-CM | POA: Diagnosis not present

## 2016-02-15 DIAGNOSIS — J019 Acute sinusitis, unspecified: Secondary | ICD-10-CM | POA: Diagnosis not present

## 2016-02-17 DIAGNOSIS — S12500A Unspecified displaced fracture of sixth cervical vertebra, initial encounter for closed fracture: Secondary | ICD-10-CM

## 2016-02-17 HISTORY — DX: Unspecified displaced fracture of sixth cervical vertebra, initial encounter for closed fracture: S12.500A

## 2016-02-20 ENCOUNTER — Emergency Department: Payer: 59

## 2016-02-20 ENCOUNTER — Emergency Department
Admission: EM | Admit: 2016-02-20 | Discharge: 2016-02-21 | Disposition: A | Discharge status: Home / Self Care | Payer: 59 | Attending: Emergency Medicine | Admitting: Emergency Medicine

## 2016-02-20 ENCOUNTER — Other Ambulatory Visit: Payer: Self-pay | Admitting: Internal Medicine

## 2016-02-20 ENCOUNTER — Encounter: Payer: Self-pay | Admitting: *Deleted

## 2016-02-20 ENCOUNTER — Ambulatory Visit
Admission: RE | Admit: 2016-02-20 | Discharge: 2016-02-20 | Disposition: A | Discharge status: Home / Self Care | Payer: 59 | Source: Ambulatory Visit | Attending: Internal Medicine | Admitting: Internal Medicine

## 2016-02-20 DIAGNOSIS — R0781 Pleurodynia: Secondary | ICD-10-CM

## 2016-02-20 DIAGNOSIS — Y999 Unspecified external cause status: Secondary | ICD-10-CM | POA: Insufficient documentation

## 2016-02-20 DIAGNOSIS — J9811 Atelectasis: Secondary | ICD-10-CM

## 2016-02-20 DIAGNOSIS — S12591A Other nondisplaced fracture of sixth cervical vertebra, initial encounter for closed fracture: Secondary | ICD-10-CM | POA: Insufficient documentation

## 2016-02-20 DIAGNOSIS — M545 Low back pain: Secondary | ICD-10-CM | POA: Diagnosis not present

## 2016-02-20 DIAGNOSIS — S6991XA Unspecified injury of right wrist, hand and finger(s), initial encounter: Secondary | ICD-10-CM | POA: Diagnosis not present

## 2016-02-20 DIAGNOSIS — M542 Cervicalgia: Secondary | ICD-10-CM

## 2016-02-20 DIAGNOSIS — M25511 Pain in right shoulder: Secondary | ICD-10-CM | POA: Diagnosis not present

## 2016-02-20 DIAGNOSIS — A000 Cholera due to Vibrio cholerae 01, biovar cholerae: Secondary | ICD-10-CM | POA: Diagnosis not present

## 2016-02-20 DIAGNOSIS — M25512 Pain in left shoulder: Secondary | ICD-10-CM

## 2016-02-20 DIAGNOSIS — M8580 Other specified disorders of bone density and structure, unspecified site: Secondary | ICD-10-CM

## 2016-02-20 DIAGNOSIS — S12501A Unspecified nondisplaced fracture of sixth cervical vertebra, initial encounter for closed fracture: Secondary | ICD-10-CM | POA: Diagnosis not present

## 2016-02-20 DIAGNOSIS — Y92410 Unspecified street and highway as the place of occurrence of the external cause: Secondary | ICD-10-CM | POA: Diagnosis not present

## 2016-02-20 DIAGNOSIS — M25531 Pain in right wrist: Secondary | ICD-10-CM

## 2016-02-20 DIAGNOSIS — I10 Essential (primary) hypertension: Secondary | ICD-10-CM | POA: Diagnosis not present

## 2016-02-20 DIAGNOSIS — S299XXA Unspecified injury of thorax, initial encounter: Secondary | ICD-10-CM | POA: Diagnosis not present

## 2016-02-20 DIAGNOSIS — S12590A Other displaced fracture of sixth cervical vertebra, initial encounter for closed fracture: Secondary | ICD-10-CM | POA: Diagnosis not present

## 2016-02-20 DIAGNOSIS — Z79899 Other long term (current) drug therapy: Secondary | ICD-10-CM | POA: Insufficient documentation

## 2016-02-20 DIAGNOSIS — R071 Chest pain on breathing: Secondary | ICD-10-CM | POA: Diagnosis not present

## 2016-02-20 DIAGNOSIS — S3992XA Unspecified injury of lower back, initial encounter: Secondary | ICD-10-CM | POA: Diagnosis not present

## 2016-02-20 DIAGNOSIS — S199XXA Unspecified injury of neck, initial encounter: Secondary | ICD-10-CM | POA: Diagnosis not present

## 2016-02-20 DIAGNOSIS — Z7982 Long term (current) use of aspirin: Secondary | ICD-10-CM | POA: Diagnosis not present

## 2016-02-20 DIAGNOSIS — S4992XA Unspecified injury of left shoulder and upper arm, initial encounter: Secondary | ICD-10-CM | POA: Diagnosis not present

## 2016-02-20 DIAGNOSIS — Y9389 Activity, other specified: Secondary | ICD-10-CM | POA: Insufficient documentation

## 2016-02-20 DIAGNOSIS — S12390A Other displaced fracture of fourth cervical vertebra, initial encounter for closed fracture: Secondary | ICD-10-CM | POA: Diagnosis not present

## 2016-02-20 DIAGNOSIS — R0789 Other chest pain: Secondary | ICD-10-CM

## 2016-02-20 LAB — CBC
HEMATOCRIT: 30.2 % — AB (ref 35.0–47.0)
HEMOGLOBIN: 10.5 g/dL — AB (ref 12.0–16.0)
MCH: 30.7 pg (ref 26.0–34.0)
MCHC: 34.7 g/dL (ref 32.0–36.0)
MCV: 88.5 fL (ref 80.0–100.0)
Platelets: 176 10*3/uL (ref 150–440)
RBC: 3.42 MIL/uL — ABNORMAL LOW (ref 3.80–5.20)
RDW: 14.3 % (ref 11.5–14.5)
WBC: 6.9 10*3/uL (ref 3.6–11.0)

## 2016-02-20 LAB — BASIC METABOLIC PANEL
ANION GAP: 8 (ref 5–15)
BUN: 21 mg/dL — ABNORMAL HIGH (ref 6–20)
CALCIUM: 9.4 mg/dL (ref 8.9–10.3)
CHLORIDE: 98 mmol/L — AB (ref 101–111)
CO2: 27 mmol/L (ref 22–32)
CREATININE: 0.82 mg/dL (ref 0.44–1.00)
GFR calc non Af Amer: >60 mL/min (ref >60)
Glucose, Bld: 111 mg/dL — ABNORMAL HIGH (ref 65–99)
Potassium: 3.9 mmol/L (ref 3.5–5.1)
SODIUM: 133 mmol/L — AB (ref 135–145)

## 2016-02-20 MED ORDER — OXYCODONE-ACETAMINOPHEN 5-325 MG PO TABS: 1.0000 | ORAL_TABLET | Freq: Four times a day (QID) | ORAL | 0 refills | Status: DC | PRN

## 2016-02-20 MED ORDER — FENTANYL CITRATE (PF) 100 MCG/2ML IJ SOLN
INTRAMUSCULAR | Status: AC
  Administered 2016-02-20: 50 ug via INTRAVENOUS
  Filled 2016-02-20: qty 2

## 2016-02-20 MED ORDER — LORAZEPAM 1 MG PO TABS
ORAL_TABLET | ORAL | Status: AC
  Administered 2016-02-20: 1 mg via ORAL
  Filled 2016-02-20: qty 1

## 2016-02-20 MED ORDER — FENTANYL CITRATE (PF) 100 MCG/2ML IJ SOLN
50.0000 ug | Freq: Once | INTRAMUSCULAR | Status: AC
  Administered 2016-02-20: 50 ug via INTRAVENOUS
  Filled 2016-02-20: qty 2

## 2016-02-20 MED ORDER — ONDANSETRON HCL 4 MG/2ML IJ SOLN
INTRAMUSCULAR | Status: AC
  Administered 2016-02-20: 4 mg
  Filled 2016-02-20: qty 2

## 2016-02-20 MED ORDER — OXYMETAZOLINE HCL 0.05 % NA SOLN
1.0000 | Freq: Once | NASAL | Status: AC
  Administered 2016-02-20: 1 via NASAL
  Filled 2016-02-20: qty 15

## 2016-02-20 MED ORDER — FENTANYL CITRATE (PF) 100 MCG/2ML IJ SOLN
50.0000 ug | Freq: Once | INTRAMUSCULAR | Status: AC
  Administered 2016-02-20: 50 ug via INTRAVENOUS

## 2016-02-20 MED ORDER — LORAZEPAM 1 MG PO TABS
1.0000 mg | ORAL_TABLET | Freq: Once | ORAL | Status: AC
  Administered 2016-02-20: 1 mg via ORAL
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
## 2016-02-20 NOTE — ED Notes (Signed)
[facility]

## 2016-02-20 NOTE — ED Notes (Signed)
Pt placed in c-collar

## 2016-02-20 NOTE — Discharge Instructions (Signed)
Please follow-up with Dr. Marcell BarlowYarborough in the next 1-2 days. His office will call you to arrange this appointment, if you have not heard from them by lunchtime tomorrow please call the office at the number provided to arrange this appointment. Return to the emergency department for any weakness or numbness of any arm or leg or worsening neck pain. Please take her pain medication as needed for any discomfort, as prescribed.

## 2016-02-20 NOTE — ED Notes (Signed)
Pt returned from MRI via stretcher with Vernona RiegerLaura, EDT
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
## 2016-02-20 NOTE — ED Notes (Signed)
Pt appears very sleepy. Informed she can rest until MRI results are back. Pt informed no food until MRI is back. Pt has been drinking sprite and water.
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
## 2016-02-20 NOTE — ED Notes (Signed)
Pt given turkey sandwich tray 

## 2016-02-20 NOTE — ED Notes (Signed)
Pt transported to MRI with Vernona RiegerLaura, EDT
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
## 2016-02-28 DIAGNOSIS — S12501A Unspecified nondisplaced fracture of sixth cervical vertebra, initial encounter for closed fracture: Secondary | ICD-10-CM | POA: Diagnosis not present

## 2016-02-29 ENCOUNTER — Ambulatory Visit: Payer: 59

## 2016-02-29 ENCOUNTER — Ambulatory Visit: Payer: 59 | Admitting: Oncology

## 2016-02-29 ENCOUNTER — Other Ambulatory Visit: Payer: 59
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
## 2016-03-21 ENCOUNTER — Ambulatory Visit: Payer: 59

## 2016-03-21 ENCOUNTER — Other Ambulatory Visit: Payer: 59

## 2016-03-21 ENCOUNTER — Ambulatory Visit: Payer: 59 | Admitting: Oncology

## 2016-04-02 DIAGNOSIS — H66009 Acute suppurative otitis media without spontaneous rupture of ear drum, unspecified ear: Secondary | ICD-10-CM | POA: Diagnosis not present

## 2016-04-02 DIAGNOSIS — M542 Cervicalgia: Secondary | ICD-10-CM | POA: Diagnosis not present

## 2016-04-10 DIAGNOSIS — R937 Abnormal findings on diagnostic imaging of other parts of musculoskeletal system: Secondary | ICD-10-CM | POA: Diagnosis not present

## 2016-04-10 DIAGNOSIS — S12501D Unspecified nondisplaced fracture of sixth cervical vertebra, subsequent encounter for fracture with routine healing: Secondary | ICD-10-CM | POA: Diagnosis not present

## 2016-04-16 NOTE — Progress Notes (Deleted)
[place]  Telephone:(336) [phone] Fax:(336) [phone]  ID: Alison Price OB: [birth year]  MR#: [id number]  URK#:[id number]  Patient Care Team: Albina Billet, MD as PCP - General (Internal Medicine)  CHIEF COMPLAINT: Pancytopenia, anemia.  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   INTERVAL HISTORY: Patient returns to clinic today for further evaluation, repeat laboratory work, and consideration of additional Procrit. She has no neurologic complaints. She denies any recent fevers. She has a good appetite and denies weight loss. She has no chest pain or shortness of breath. She denies any nausea, vomiting, constipation, or diarrhea. She has no melena or hematochezia. She has no urinary complaints. Patient offers no further specific complaints today.  REVIEW OF SYSTEMS:   Review of Systems  Constitutional: Negative for fever and weight loss.  Respiratory: Negative.  Negative for sputum production.   Cardiovascular: Negative.  Negative for chest pain.  Gastrointestinal: Negative.  Negative for abdominal pain, blood in stool and melena.  Musculoskeletal: Negative.   Neurological: Negative.  Negative for weakness.  Endo/Heme/Allergies: Does not bruise/bleed easily.  Psychiatric/Behavioral: Negative.  The patient is not nervous/anxious.     As per HPI. Otherwise, a complete review of systems is negatve.  PAST MEDICAL HISTORY: Past Medical History:  Diagnosis Date  . Anemia   . Arthritis   . Hypertension   . Osteoporosis     PAST SURGICAL HISTORY: Past Surgical History:  Procedure Laterality Date  . ABDOMINAL HYSTERECTOMY     partial  . BACK SURGERY    . FOOT SURGERY    . HAND SURGERY     THUMB REPAIR  . HEMORRHOID SURGERY    . POSTERIOR REPAIR      FAMILY HISTORY Family History  Problem Relation Age of Onset  . Diabetes Paternal Grandmother   . Uterine cancer Maternal Aunt   . Stomach cancer Maternal Aunt   . Breast cancer Neg Hx   . Ovarian  cancer Neg Hx   . Colon cancer Neg Hx   . Heart disease Neg Hx        ADVANCED DIRECTIVES:    HEALTH MAINTENANCE: Social History  Substance Use Topics  . Smoking status: Never Smoker  . Smokeless tobacco: Never Used  . Alcohol use No     Colonoscopy:  PAP:  Bone density:  Lipid panel:  Allergies  Allergen Reactions  . Sulfa Antibiotics Other (See Comments)    STEVEN JOHNSON SYNDROME    Current Outpatient Prescriptions  Medication Sig Dispense Refill  . albuterol-ipratropium (COMBIVENT) 18-103 MCG/ACT inhaler Inhale 2 puffs into the lungs every 6 (six) hours as needed for wheezing or shortness of breath.    Marland Kitchen amLODipine (NORVASC) 10 MG tablet   3  . aspirin 81 MG tablet Take 81 mg by mouth daily.    . Butalbital-APAP-Caffeine 50-300-40 MG CAPS   3  . Choline Fenofibrate (FENOFIBRIC ACID) 135 MG CPDR   3  . conjugated estrogens (PREMARIN) vaginal cream 1 gram intravaginal BIW 60 g 1  . etodolac (LODINE) 400 MG tablet Take 400 mg by mouth 2 (two) times daily.    . Ferrous Sulfate (SLOW FE) 142 (45 Fe) MG TBCR Take 1 tablet by mouth daily.    . fexofenadine (ALLEGRA) 180 MG tablet Take 180 mg by mouth daily.    . Fluticasone-Salmeterol (ADVAIR) 250-50 MCG/DOSE AEPB Inhale 1 puff into the lungs 2 (two) times daily.    Marland Kitchen lisinopril (PRINIVIL,ZESTRIL) 20 MG tablet Take 20 mg by mouth daily.    Marland Kitchen  meclizine (ANTIVERT) 25 MG tablet Take 25 mg by mouth 2 (two) times daily as needed for dizziness.    Marland Kitchen morphine (MS CONTIN) 60 MG 12 hr tablet   0  . Multiple Vitamin (MULTIVITAMIN) tablet Take 1 tablet by mouth daily.    . ondansetron (ZOFRAN) 4 MG tablet   4  . Oxycodone HCl 20 MG TABS   0  . oxyCODONE-acetaminophen (ROXICET) 5-325 MG tablet Take 1 tablet by mouth every 6 (six) hours as needed. 20 tablet 0  . pantoprazole (PROTONIX) 40 MG tablet   3  . tiZANidine (ZANAFLEX) 4 MG tablet   4   No current facility-administered medications for this visit.     OBJECTIVE: There  were no vitals filed for this visit.   There is no height or weight on file to calculate BMI.    ECOG FS:0 - Asymptomatic  General: Well-developed, well-nourished, no acute distress. Eyes: Pink conjunctiva, anicteric sclera. Lungs: Clear to auscultation bilaterally. Heart: Regular rate and rhythm. No rubs, murmurs, or gallops. Abdomen: Soft, nontender, nondistended. No organomegaly noted, normoactive bowel sounds. Musculoskeletal: No edema, cyanosis, or clubbing. Neuro: Alert, answering all questions appropriately. Cranial nerves grossly intact. Skin: No rashes or petechiae noted. Psych: Normal affect.   LAB RESULTS:  Lab Results  Component Value Date   NA 133 (L) 02/20/2016   K 3.9 02/20/2016   CL 98 (L) 02/20/2016   CO2 27 02/20/2016   GLUCOSE 111 (H) 02/20/2016   BUN 21 (H) 02/20/2016   CREATININE 0.82 02/20/2016   CALCIUM 9.4 02/20/2016   PROT 6.7 01/06/2013   ALBUMIN 3.6 01/06/2013   AST 27 01/06/2013   ALT 22 01/06/2013   ALKPHOS 69 01/06/2013   BILITOT 0.6 01/25/2013   GFRNONAA >60 02/20/2016   GFRAA >60 02/20/2016    Lab Results  Component Value Date   WBC 6.9 02/20/2016   NEUTROABS 1.7 03/29/2013   HGB 10.5 (L) 02/20/2016   HCT 30.2 (L) 02/20/2016   MCV 88.5 02/20/2016   PLT 176 02/20/2016   Lab Results  Component Value Date   FERRITIN 290 11/23/2015   Lab Results  Component Value Date   IRON 68 11/23/2015   TIBC 316 11/23/2015   IRONPCTSAT 22 11/23/2015      STUDIES: No results found.  ASSESSMENT: Pancytopenia, anemia.  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  PLAN:    1. Pancytopenia: Extensive workup in 2014-15 did not reveal distinct etiology.  A bone marrow biopsy was discussed for concern of MDS, but was determined not to be necessary and patient is refusing. Given her mild renal insufficiency, patient will likely benefit from Procrit. Patient last received Procrit in June 2017. Patient's iron stores are within normal limits. Given  patient's hemoglobin is greater than 10.0 today, she does not require Procrit. Return to clinic in 3 months for laboratory work and consideration of Procrit if her hemoglobin falls below 10.0. She will then return to clinic in 6 months for further evaluation.  2. Anemia:  Procrit as above. Repeat iron stores with next blood draw.  Patient expressed understanding and was in agreement with this plan. She also understands that She can call clinic at any time with any questions, concerns, or complaints.   Lloyd Huger, MD   04/16/2016 11:32 PM

## 2016-04-18 ENCOUNTER — Inpatient Hospital Stay: Payer: No Typology Code available for payment source

## 2016-04-18 ENCOUNTER — Inpatient Hospital Stay: Payer: No Typology Code available for payment source | Admitting: Oncology

## 2016-05-08 DIAGNOSIS — S12501D Unspecified nondisplaced fracture of sixth cervical vertebra, subsequent encounter for fracture with routine healing: Secondary | ICD-10-CM | POA: Diagnosis not present

## 2016-05-08 DIAGNOSIS — S12500A Unspecified displaced fracture of sixth cervical vertebra, initial encounter for closed fracture: Secondary | ICD-10-CM | POA: Diagnosis not present

## 2016-05-30 ENCOUNTER — Other Ambulatory Visit: Payer: 59

## 2016-05-30 ENCOUNTER — Ambulatory Visit: Payer: 59 | Admitting: Oncology

## 2016-05-30 ENCOUNTER — Ambulatory Visit: Payer: 59

## 2016-06-13 ENCOUNTER — Other Ambulatory Visit: Payer: Self-pay

## 2016-06-13 ENCOUNTER — Inpatient Hospital Stay: Payer: No Typology Code available for payment source | Attending: Oncology | Admitting: Oncology

## 2016-06-13 ENCOUNTER — Inpatient Hospital Stay: Payer: No Typology Code available for payment source

## 2016-06-13 DIAGNOSIS — D61818 Other pancytopenia: Secondary | ICD-10-CM

## 2016-06-16 DIAGNOSIS — J069 Acute upper respiratory infection, unspecified: Secondary | ICD-10-CM | POA: Diagnosis not present

## 2016-08-25 DIAGNOSIS — M542 Cervicalgia: Secondary | ICD-10-CM | POA: Diagnosis not present

## 2016-08-25 DIAGNOSIS — I10 Essential (primary) hypertension: Secondary | ICD-10-CM | POA: Diagnosis not present

## 2016-08-25 DIAGNOSIS — M5134 Other intervertebral disc degeneration, thoracic region: Secondary | ICD-10-CM | POA: Diagnosis not present

## 2016-08-25 DIAGNOSIS — M545 Low back pain: Secondary | ICD-10-CM | POA: Diagnosis not present

## 2016-10-21 DIAGNOSIS — H0012 Chalazion right lower eyelid: Secondary | ICD-10-CM | POA: Diagnosis not present

## 2016-10-22 DIAGNOSIS — H0012 Chalazion right lower eyelid: Secondary | ICD-10-CM | POA: Diagnosis not present

## 2016-10-23 ENCOUNTER — Observation Stay
Admission: EM | Admit: 2016-10-23 | Discharge: 2016-10-25 | Disposition: A | Discharge status: Home / Self Care | Payer: 59 | Attending: Internal Medicine | Admitting: Internal Medicine

## 2016-10-23 ENCOUNTER — Encounter: Payer: Self-pay | Admitting: *Deleted

## 2016-10-23 ENCOUNTER — Emergency Department: Payer: 59

## 2016-10-23 DIAGNOSIS — Z8049 Family history of malignant neoplasm of other genital organs: Secondary | ICD-10-CM | POA: Diagnosis not present

## 2016-10-23 DIAGNOSIS — E876 Hypokalemia: Secondary | ICD-10-CM | POA: Diagnosis not present

## 2016-10-23 DIAGNOSIS — I10 Essential (primary) hypertension: Secondary | ICD-10-CM | POA: Diagnosis not present

## 2016-10-23 DIAGNOSIS — Z882 Allergy status to sulfonamides status: Secondary | ICD-10-CM | POA: Diagnosis not present

## 2016-10-23 DIAGNOSIS — K219 Gastro-esophageal reflux disease without esophagitis: Secondary | ICD-10-CM | POA: Insufficient documentation

## 2016-10-23 DIAGNOSIS — Z7982 Long term (current) use of aspirin: Secondary | ICD-10-CM | POA: Insufficient documentation

## 2016-10-23 DIAGNOSIS — Z833 Family history of diabetes mellitus: Secondary | ICD-10-CM | POA: Diagnosis not present

## 2016-10-23 DIAGNOSIS — Z79899 Other long term (current) drug therapy: Secondary | ICD-10-CM | POA: Insufficient documentation

## 2016-10-23 DIAGNOSIS — I119 Hypertensive heart disease without heart failure: Secondary | ICD-10-CM | POA: Diagnosis not present

## 2016-10-23 DIAGNOSIS — Z23 Encounter for immunization: Secondary | ICD-10-CM | POA: Insufficient documentation

## 2016-10-23 DIAGNOSIS — R112 Nausea with vomiting, unspecified: Secondary | ICD-10-CM | POA: Diagnosis not present

## 2016-10-23 DIAGNOSIS — Z90710 Acquired absence of both cervix and uterus: Secondary | ICD-10-CM | POA: Diagnosis not present

## 2016-10-23 DIAGNOSIS — D61818 Other pancytopenia: Secondary | ICD-10-CM | POA: Insufficient documentation

## 2016-10-23 DIAGNOSIS — E860 Dehydration: Secondary | ICD-10-CM | POA: Diagnosis present

## 2016-10-23 DIAGNOSIS — Z881 Allergy status to other antibiotic agents status: Secondary | ICD-10-CM | POA: Diagnosis not present

## 2016-10-23 DIAGNOSIS — R197 Diarrhea, unspecified: Secondary | ICD-10-CM | POA: Diagnosis not present

## 2016-10-23 DIAGNOSIS — Z800 Family history of malignant neoplasm of digestive organs: Secondary | ICD-10-CM | POA: Insufficient documentation

## 2016-10-23 DIAGNOSIS — I517 Cardiomegaly: Secondary | ICD-10-CM | POA: Diagnosis not present

## 2016-10-23 DIAGNOSIS — G8929 Other chronic pain: Secondary | ICD-10-CM | POA: Diagnosis not present

## 2016-10-23 HISTORY — DX: Stevens-Johnson syndrome: L51.1

## 2016-10-23 LAB — URINALYSIS, COMPLETE (UACMP) WITH MICROSCOPIC
BILIRUBIN URINE: NEGATIVE
Bacteria, UA: NONE SEEN
GLUCOSE, UA: NEGATIVE mg/dL
KETONES UR: NEGATIVE mg/dL
LEUKOCYTES UA: NEGATIVE
NITRITE: NEGATIVE
PROTEIN: NEGATIVE mg/dL
Specific Gravity, Urine: 1.012 (ref 1.005–1.030)
pH: 7 (ref 5.0–8.0)

## 2016-10-23 LAB — CBC
HCT: 32.8 % — ABNORMAL LOW (ref 35.0–47.0)
Hemoglobin: 11.6 g/dL — ABNORMAL LOW (ref 12.0–16.0)
MCH: 31.7 pg (ref 26.0–34.0)
MCHC: 35.4 g/dL (ref 32.0–36.0)
MCV: 89.6 fL (ref 80.0–100.0)
PLATELETS: 172 10*3/uL (ref 150–440)
RBC: 3.66 MIL/uL — ABNORMAL LOW (ref 3.80–5.20)
RDW: 13.7 % (ref 11.5–14.5)
WBC: 5.5 10*3/uL (ref 3.6–11.0)

## 2016-10-23 LAB — COMPREHENSIVE METABOLIC PANEL
ALT: 14 U/L (ref 14–54)
ANION GAP: 9 (ref 5–15)
AST: 29 U/L (ref 15–41)
Albumin: 4.5 g/dL (ref 3.5–5.0)
Alkaline Phosphatase: 57 U/L (ref 38–126)
BILIRUBIN TOTAL: 0.8 mg/dL (ref 0.3–1.2)
BUN: 15 mg/dL (ref 6–20)
CHLORIDE: 103 mmol/L (ref 101–111)
CO2: 27 mmol/L (ref 22–32)
Calcium: 9.5 mg/dL (ref 8.9–10.3)
Creatinine, Ser: 0.64 mg/dL (ref 0.44–1.00)
GFR calc Af Amer: >60 mL/min (ref >60)
Glucose, Bld: 135 mg/dL — ABNORMAL HIGH (ref 65–99)
Potassium: 2.9 mmol/L — ABNORMAL LOW (ref 3.5–5.1)
Sodium: 139 mmol/L (ref 135–145)
TOTAL PROTEIN: 7.9 g/dL (ref 6.5–8.1)

## 2016-10-23 LAB — TROPONIN I: Troponin I: <0.03 ng/mL (ref <0.03)

## 2016-10-23 LAB — LIPASE, BLOOD: LIPASE: 50 U/L (ref 11–51)

## 2016-10-23 MED ORDER — POTASSIUM CHLORIDE CRYS ER 20 MEQ PO TBCR
40.0000 meq | EXTENDED_RELEASE_TABLET | Freq: Once | ORAL | Status: DC
  Filled 2016-10-23: qty 2

## 2016-10-23 MED ORDER — HYDROMORPHONE HCL 1 MG/ML IJ SOLN
0.5000 mg | Freq: Once | INTRAMUSCULAR | Status: DC
  Filled 2016-10-23: qty 1

## 2016-10-23 MED ORDER — SODIUM CHLORIDE 0.9 % IV BOLUS (SEPSIS)
1000.0000 mL | Freq: Once | INTRAVENOUS | Status: AC
Start: 2016-10-23 — End: 2016-10-24
  Administered 2016-10-23: 1000 mL via INTRAVENOUS

## 2016-10-23 MED ORDER — PROMETHAZINE HCL 25 MG/ML IJ SOLN
INTRAMUSCULAR | Status: AC
  Filled 2016-10-23: qty 1

## 2016-10-23 MED ORDER — POTASSIUM CHLORIDE 10 MEQ/100ML IV SOLN
10.0000 meq | Freq: Once | INTRAVENOUS | Status: AC
  Administered 2016-10-23: 10 meq via INTRAVENOUS

## 2016-10-23 MED ORDER — POTASSIUM CHLORIDE 10 MEQ/100ML IV SOLN
10.0000 meq | INTRAVENOUS | Status: AC
  Administered 2016-10-23 (×2): 10 meq via INTRAVENOUS
  Filled 2016-10-23 (×3): qty 100

## 2016-10-23 MED ORDER — ONDANSETRON HCL 4 MG/2ML IJ SOLN
4.0000 mg | Freq: Once | INTRAMUSCULAR | Status: AC
Start: 2016-10-23 — End: 2016-10-23
  Administered 2016-10-23: 4 mg via INTRAVENOUS
  Filled 2016-10-23: qty 2

## 2016-10-23 MED ORDER — DIMENHYDRINATE 50 MG PO TABS
25.0000 mg | ORAL_TABLET | Freq: Once | ORAL | Status: DC
  Filled 2016-10-23: qty 1

## 2016-10-23 MED ORDER — METOCLOPRAMIDE HCL 5 MG/ML IJ SOLN
10.0000 mg | Freq: Once | INTRAMUSCULAR | Status: AC
Start: 2016-10-23 — End: 2016-10-23
  Administered 2016-10-23: 10 mg via INTRAVENOUS
  Filled 2016-10-23: qty 2

## 2016-10-23 MED ORDER — DIPHENHYDRAMINE HCL 50 MG/ML IJ SOLN
12.5000 mg | Freq: Once | INTRAMUSCULAR | Status: AC
  Administered 2016-10-23: 12.5 mg via INTRAVENOUS
  Filled 2016-10-23: qty 1

## 2016-10-23 MED ORDER — SODIUM CHLORIDE 0.9 % IV BOLUS (SEPSIS)
1000.0000 mL | Freq: Once | INTRAVENOUS | Status: AC
  Administered 2016-10-23: 1000 mL via INTRAVENOUS

## 2016-10-23 MED ORDER — MORPHINE SULFATE (PF) 4 MG/ML IV SOLN
4.0000 mg | Freq: Once | INTRAVENOUS | Status: AC
  Administered 2016-10-23: 4 mg via INTRAVENOUS
  Filled 2016-10-23: qty 1

## 2016-10-23 MED ORDER — PROMETHAZINE HCL 25 MG/ML IJ SOLN
25.0000 mg | Freq: Once | INTRAMUSCULAR | Status: AC
  Administered 2016-10-23: 25 mg via INTRAVENOUS

## 2016-10-23 MED ORDER — FENTANYL CITRATE (PF) 100 MCG/2ML IJ SOLN
50.0000 ug | Freq: Once | INTRAMUSCULAR | Status: AC
  Administered 2016-10-23: 50 ug via INTRAVENOUS
  Filled 2016-10-23: qty 2

## 2016-10-23 NOTE — ED Provider Notes (Signed)
[facility] Emergency Department Provider Note  ____________________________________________  Time seen: Approximately 7:44 PM  I have reviewed the triage vital signs and the nursing notes.   HISTORY  Chief Complaint Emesis    HPI Alison Price is a 71 y.o. female with a history of total abdominal hysterectomy, HTN, presenting with nausea vomiting and diarrhea. The patient reports that she was in her usual state of health until this morning when she began to have uncontrolled nausea and vomiting with loose, watery nonbloody stool. She has not had any other associated fevers, chills, urinary symptoms, or abdominal pain. She has had a mild nonproductive cough without congestion or rhinorrhea, fevers or chills. 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 In route, she was given Zofran without any improvement in her symptoms.   Past Medical History:  Diagnosis Date  . Anemia   . Arthritis   . Hypertension   . Osteoporosis   . Stevens-Johnson syndrome [facility](HCC)     Patient Active Problem List   Diagnosis Date Noted  . History of total abdominal hysterectomy 06/26/2015  . Pelvic pressure in female 06/26/2015  . Vaginal atrophy 06/26/2015  . Hematuria 06/26/2015  . Dysuria 06/26/2015  . Pancytopenia (HCC) 04/13/2015    Past Surgical History:  Procedure Laterality Date  . ABDOMINAL HYSTERECTOMY     partial  . BACK SURGERY    . FOOT SURGERY    . HAND SURGERY     THUMB REPAIR  . HEMORRHOID SURGERY    . POSTERIOR REPAIR      Current Outpatient Rx  . Order #: [id number] Class: Historical Med  . Order #: [id number] Class: Historical Med  . Order #: [id number] Class: Historical Med  . Order #: [id number] Class: Historical Med  . Order #: [id number] Class: Historical Med  . Order #: [id number] Class: Normal  . Order #: [id number] Class: Historical Med  . Order #: [id number] Class: Historical Med  . Order #: [id number] Class: Historical Med  . Order  #: [id number] Class: Historical Med  . Order #: [id number] Class: Historical Med  . Order #: [id number] Class: Historical Med  . Order #: [id number] Class: Historical Med  . Order #: [id number] Class: Historical Med  . Order #: [id number] Class: Historical Med  . Order #: [id number] Class: Historical Med  . Order #: [id number] Class: Print  . Order #: [id number] Class: Historical Med  . Order #: [id number] Class: Historical Med    Allergies Sulfa antibiotics  Family History  Problem Relation Age of Onset  . Diabetes Paternal Grandmother   . Uterine cancer Maternal Aunt   . Stomach cancer Maternal Aunt   . Breast cancer Neg Hx   . Ovarian cancer Neg Hx   . Colon cancer Neg Hx   . Heart disease Neg Hx     Social History Social History  Substance Use Topics  . Smoking status: Never Smoker  . Smokeless tobacco: Never Used  . Alcohol use No    Review of Systems Constitutional: No fever/chills.No lightheadedness or syncope. Eyes: No visual changes. ENT: No sore throat. No congestion or rhinorrhea. Cardiovascular: Denies chest pain. Denies palpitations. Respiratory: Denies shortness of breath.  Mild nonproductive cough. Gastrointestinal: No abdominal pain.  Positive nausea, positive vomiting.  No diarrhea.  No constipation. Genitourinary: Negative for dysuria. No urinary frequency. Musculoskeletal: Negative for back pain. Skin: Negative for rash. Neurological: Negative for headaches. No focal numbness, tingling or weakness.     ____________________________________________   PHYSICAL EXAM:  VITAL SIGNS: ED Triage Vitals [10/23/16 1941]  Enc Vitals Group  BP      Pulse      Resp      Temp      Temp src      SpO2      Weight      Height      Head Circumference      Peak Flow      Pain Score 0     Pain Loc      Pain Edu?      Excl. in GC?     Constitutional: Alert and oriented. Uncomfortable appearing but nontoxic. Answers questions appropriately. Eyes: Conjunctivae are  normal.  EOMI. No scleral icterus. 4 x 4 millimeter bruise under the left orbit Head: Atraumatic. Nose: No congestion/rhinnorhea. Mouth/Throat: Mucous membranes are moist.  Neck: No stridor.  Supple.  No JVD. No meningismus. Cardiovascular: Normal rate, regular rhythm. No murmurs, rubs or gallops.  Respiratory: Normal respiratory effort.  No accessory muscle use or retractions. Lungs CTAB.  No wheezes, rales or ronchi. Gastrointestinal: Soft, nontender and nondistended.  No guarding or rebound.  No peritoneal signs. Musculoskeletal: No LE edema. No ttp in the calves or palpable cords.  Negative Homan's sign. Neurologic:  A&Ox3.  Speech is clear.  Face and smile are symmetric.  EOMI.  Moves all extremities well. Skin:  Skin is warm, dry and intact. No rash noted. Psychiatric: Mood and affect are normal. Speech and behavior are normal.  Normal judgement.  ____________________________________________   LABS (all labs ordered are listed, but only abnormal results are displayed)  Labs Reviewed  CBC - Abnormal; Notable for the following:       Result Value   RBC 3.66 (*)    Hemoglobin 11.6 (*)    HCT 32.8 (*)    All other components within normal limits  COMPREHENSIVE METABOLIC PANEL - Abnormal; Notable for the following:    Potassium 2.9 (*)    Glucose, Bld 135 (*)    All other components within normal limits  URINALYSIS, COMPLETE (UACMP) WITH MICROSCOPIC - Abnormal; Notable for the following:    Color, Urine YELLOW (*)    APPearance CLEAR (*)    Hgb urine dipstick SMALL (*)    Squamous Epithelial / LPF 0-5 (*)    All other components within normal limits  LIPASE, BLOOD  TROPONIN I   ____________________________________________  EKG  Not indicated ____________________________________________  RADIOLOGY  Dg Chest Portable 1 View  Result Date: 10/23/2016 CLINICAL DATA:  Nausea, vomiting and diarrhea. EXAM: PORTABLE CHEST 1 VIEW COMPARISON:  Chest radiograph 02/20/2016,  12/11/2015. Abdominal CT 11/10/2011 FINDINGS: Unchanged heart size and mediastinal contours with mild cardiomegaly and tortuous atherosclerotic thoracic aorta. Ill-defined right infrahilar opacity is likely atelectasis/scarring as seen on lung bases from prior abdominal CT. Left basilar atelectasis or scarring. No confluent consolidation. No pleural fluid. No pulmonary edema. No pneumothorax. Air-filled colon in the right upper quadrant is similar to prior exams. IMPRESSION: 1. Bibasilar atelectasis or scarring.  No acute abnormality. 2. Mild cardiomegaly with tortuous atherosclerotic thoracic aorta, unchanged. Electronically Signed   By: Rubye Oaks M.D.   On: 10/23/2016 20:07    ____________________________________________   PROCEDURES  Procedure(s) performed: None  Procedures  Critical Care performed: No ____________________________________________   INITIAL IMPRESSION / ASSESSMENT AND PLAN / ED COURSE  Pertinent labs & imaging results that were available during my care of the patient were reviewed by me and considered in my medical decision making (see chart for details).  71 y.o. female with a history of  hysterectomy with otherwise no intra-abdominal surgeries presenting with nausea vomiting and diarrhea. Overall, the patient is afebrile and hemodynamically stable. She is mentating normally. Her abdominal examination is reassuring without any obvious points of tenderness. Will plan to initiate immediate symptomatic treatment, get a chest x-ray to rule out pneumonia as she has had similar symptoms with pneumonia in the past and has a mild cough, rule out UTI. Based on the results of her laboratory studies, we will determine whether further CT imaging is indicated. Plan reevaluation for final disposition.  ----------------------------------------- 10:59 PM on 10/23/2016 -----------------------------------------  Throughout the patient's emergency department visit, she has received  multiple rounds of antibiotics and still states "I feel he can have throughout. 5 minutes." I've seen her spitting a significant amount but have not seen any actual vomiting. However, the patient does not feel that she is able to go home due to the severe nausea that she continues to have. In addition, the patient does have hypokalemia, and is unable to take oral supplementation, so IV supplementation has been ordered. Her urinalysis does not show UTI mind on reexamination her belly continues to be soft, nondistended and nontender. At this time, I'll plan to admit the patient for further treatment and continued evaluation.  ____________________________________________  FINAL CLINICAL IMPRESSION(S) / ED DIAGNOSES  Final diagnoses:  Intractable vomiting with nausea, unspecified vomiting type  Diarrhea, unspecified type  Hypokalemia         NEW MEDICATIONS STARTED DURING THIS VISIT:  New Prescriptions   No medications on file      Rockne Menghini, MD 10/23/16 2300

## 2016-10-23 NOTE — H&P (Signed)
[facility] [place] [facility]   PATIENT NAME: Alison Price    MR#:  [id number]  DATE OF BIRTH:  Feb 20, 1945  DATE OF ADMISSION:  10/23/2016  PRIMARY CARE PHYSICIAN: Jaclyn Shaggyate, Denny C, MD   REQUESTING/REFERRING PHYSICIAN: Sharma CovertNorman, MD  CHIEF COMPLAINT:   Chief Complaint  Patient presents with  . Emesis    HISTORY OF PRESENT ILLNESS:  Alison KimConstance Price  is a 71 y.o. female who presents with intractable nausea and vomiting for the past 2 days. Patient states that she has always been prone to have nausea and vomiting, and has had bouts similar to this in the past. Despite multiple rounds of antiemetics in the ED she is unable to control her vomiting. Hospitalists were called for admission  PAST MEDICAL HISTORY:   Past Medical History:  Diagnosis Date  . Anemia   . Arthritis   . Hypertension   . Osteoporosis   . Stevens-Johnson syndrome (HCC)     PAST SURGICAL HISTORY:   Past Surgical History:  Procedure Laterality Date  . ABDOMINAL HYSTERECTOMY     partial  . BACK SURGERY    . FOOT SURGERY    . HAND SURGERY     THUMB REPAIR  . HEMORRHOID SURGERY    . POSTERIOR REPAIR      SOCIAL HISTORY:   Social History  Substance Use Topics  . Smoking status: Never Smoker  . Smokeless tobacco: Never Used  . Alcohol use No    FAMILY HISTORY:   Family History  Problem Relation Age of Onset  . Diabetes Paternal Grandmother   . Uterine cancer Maternal Aunt   . Stomach cancer Maternal Aunt   . Breast cancer Neg Hx   . Ovarian cancer Neg Hx   . Colon cancer Neg Hx   . Heart disease Neg Hx     DRUG ALLERGIES:   Allergies  Allergen Reactions  . Sulfa Antibiotics Other (See Comments)    Trudie BucklerSTEVEN JOHNSON SYNDROME    MEDICATIONS AT HOME:   Prior to Admission medications   Medication Sig Start Date End Date Taking? Authorizing Provider  albuterol-ipratropium (COMBIVENT) 18-103 MCG/ACT inhaler Inhale 2 puffs into the lungs every 6 (six) hours  as needed for wheezing or shortness of breath.   Yes [provider]  amLODipine (NORVASC) 10 MG tablet Take 10 mg by mouth daily.  04/09/15  Yes [provider]  aspirin 81 MG tablet Take 81 mg by mouth daily.   Yes [provider]  butalbital-acetaminophen-caffeine (FIORICET, ESGIC) 50-325-40 MG tablet Take 1 tablet by mouth every 6 (six) hours as needed for headache or migraine.   Yes [provider]  Choline Fenofibrate (FENOFIBRIC ACID) 135 MG CPDR  04/09/15  Yes [provider]  etodolac (LODINE) 400 MG tablet Take 400 mg by mouth 2 (two) times daily.   Yes [provider]  Ferrous Sulfate (SLOW FE) 142 (45 Fe) MG TBCR Take 1 tablet by mouth daily.   Yes [provider]  Fluticasone-Salmeterol (ADVAIR) 250-50 MCG/DOSE AEPB Inhale 1 puff into the lungs 2 (two) times daily.   Yes [provider]  levofloxacin (LEVAQUIN) 500 MG tablet Take 500 mg by mouth daily. 10/21/16 11/02/16 Yes [provider]  lisinopril (PRINIVIL,ZESTRIL) 20 MG tablet Take 20 mg by mouth daily.   Yes [provider]  meclizine (ANTIVERT) 25 MG tablet Take 25 mg by mouth 2 (two) times daily as needed for dizziness.   Yes [provider]  morphine (MS CONTIN) 60 MG 12 hr tablet Take 60 mg by mouth 3 (three) times daily.  03/15/15  Yes [provider]  Multiple Vitamin (MULTIVITAMIN) tablet Take 1 tablet by mouth daily.   Yes [provider]  ondansetron (ZOFRAN) 4 MG tablet Take 4 mg by mouth every 8 (eight) hours as needed for nausea or vomiting.    Yes [provider]  Oxycodone HCl 10 MG TABS Take 10 mg by mouth 3 (three) times daily.   Yes [provider]  pantoprazole (PROTONIX) 40 MG tablet Take 40 mg by mouth daily.    Yes [provider]  tiZANidine (ZANAFLEX) 4 MG tablet Take 4 mg by mouth every 6 (six) hours as needed.  01/17/15  Yes [provider]  conjugated  estrogens (PREMARIN) vaginal cream 1 gram intravaginal BIW Patient not taking: Reported on 10/23/2016 06/26/15   Defrancesco, Prentice Docker, MD  fexofenadine (ALLEGRA) 180 MG tablet Take 180 mg by mouth daily.    [provider]  oxyCODONE-acetaminophen (ROXICET) 5-325 MG tablet Take 1 tablet by mouth every 6 (six) hours as needed. 02/20/16   Minna Antis, MD    REVIEW OF SYSTEMS:  Review of Systems  Constitutional: Negative for chills, fever, malaise/fatigue and weight loss.  HENT: Negative for ear pain, hearing loss and tinnitus.   Eyes: Negative for blurred vision, double vision, pain and redness.  Respiratory: Negative for cough, hemoptysis and shortness of breath.   Cardiovascular: Negative for chest pain, palpitations, orthopnea and leg swelling.  Gastrointestinal: Positive for abdominal pain, nausea and vomiting. Negative for constipation and diarrhea.  Genitourinary: Negative for dysuria, frequency and hematuria.  Musculoskeletal: Negative for back pain, joint pain and neck pain.  Skin:       No acne, rash, or lesions  Neurological: Negative for dizziness, tremors, focal weakness and weakness.  Endo/Heme/Allergies: Negative for polydipsia. Does not bruise/bleed easily.  Psychiatric/Behavioral: Negative for depression. The patient is not nervous/anxious and does not have insomnia.      VITAL SIGNS:   Vitals:   10/23/16 2000 10/23/16 2030 10/23/16 2045 10/23/16 2145  BP: (!) 202/91 (!) 192/89 (!) 184/140 (!) 196/80  Pulse: 80 68 87 67  Resp: 16 15 (!) 25 19  Temp:      TempSrc:      SpO2: 100% 100% 100% 98%  Weight:      Height:       Wt Readings from Last 3 Encounters:  10/23/16 68 kg (150 lb)  02/20/16 64.4 kg (142 lb)  12/03/15 68 kg (150 lb)    PHYSICAL EXAMINATION:  Physical Exam  Vitals reviewed. Constitutional: She is oriented to person, place, and time. She appears well-developed and well-nourished. No distress.  HENT:  Head: Normocephalic and  atraumatic.  Mouth/Throat: Oropharynx is clear and moist.  Dry mucous membranes  Eyes: Pupils are equal, round, and reactive to light. Conjunctivae and EOM are normal. No scleral icterus.  Neck: Normal range of motion. Neck supple. No JVD present. No thyromegaly present.  Cardiovascular: Normal rate, regular rhythm and intact distal pulses.  Exam reveals no gallop and no friction rub.   No murmur heard. Respiratory: Effort normal and breath sounds normal. No respiratory distress. She has no wheezes. She has no rales.  GI: Soft. Bowel sounds are normal. She exhibits no distension. There is tenderness.  Musculoskeletal: Normal range of motion. She exhibits no edema.  No arthritis, no gout  Lymphadenopathy:    She has no cervical adenopathy.  Neurological: She is alert and oriented to person, place, and time. No cranial nerve deficit.  No dysarthria, no aphasia  Skin: Skin is warm and dry. No rash noted. No erythema.  Psychiatric: She has a normal mood and affect. Her behavior is normal. Judgment and thought content normal.    LABORATORY PANEL:   CBC  Recent Labs Lab 10/23/16 1954  WBC 5.5  HGB 11.6*  HCT 32.8*  PLT 172   ------------------------------------------------------------------------------------------------------------------  Chemistries   Recent Labs Lab 10/23/16 1954  NA 139  K 2.9*  CL 103  CO2 27  GLUCOSE 135*  BUN 15  CREATININE 0.64  CALCIUM 9.5  AST 29  ALT 14  ALKPHOS 57  BILITOT 0.8   ------------------------------------------------------------------------------------------------------------------  Cardiac Enzymes  Recent Labs Lab 10/23/16 1954  TROPONINI <0.03   ------------------------------------------------------------------------------------------------------------------  RADIOLOGY:  Dg Chest Portable 1 View  Result Date: 10/23/2016 CLINICAL DATA:  Nausea, vomiting and diarrhea. EXAM: PORTABLE CHEST 1 VIEW COMPARISON:  Chest  radiograph 02/20/2016, 12/11/2015. Abdominal CT 11/10/2011 FINDINGS: Unchanged heart size and mediastinal contours with mild cardiomegaly and tortuous atherosclerotic thoracic aorta. Ill-defined right infrahilar opacity is likely atelectasis/scarring as seen on lung bases from prior abdominal CT. Left basilar atelectasis or scarring. No confluent consolidation. No pleural fluid. No pulmonary edema. No pneumothorax. Air-filled colon in the right upper quadrant is similar to prior exams. IMPRESSION: 1. Bibasilar atelectasis or scarring.  No acute abnormality. 2. Mild cardiomegaly with tortuous atherosclerotic thoracic aorta, unchanged. Electronically Signed   By: Rubye Oaks M.D.   On: 10/23/2016 20:07    EKG:   Orders placed or performed during the hospital encounter of 10/23/16  . EKG 12-Lead  . EKG 12-Lead    IMPRESSION AND PLAN:  Principal Problem:   Intractable nausea and vomiting - multiple when necessary antiemetics, IV fluids Active Problems:   Hypokalemia - replace and monitor   Dehydration - IV fluids   HTN (hypertension) - continue home meds   GERD - home dose PPI  All the records are reviewed and case discussed with ED provider. Management plans discussed with the patient and/or family.  DVT PROPHYLAXIS: SubQ lovenox  GI PROPHYLAXIS: PPI  ADMISSION STATUS: Observation  CODE STATUS: Full Code Status History    This patient does not have a recorded code status. Please follow your organizational policy for patients in this situation.      TOTAL TIME TAKING CARE OF THIS PATIENT: 40 minutes.   Kein Carlberg FIELDING 10/23/2016, 11:49 PM  [facility]  Office  [phone]  CC: Primary care physician; Jaclyn Shaggy, MD  Note:  This document was prepared using Dragon voice recognition software and may include unintentional dictation errors.

## 2016-10-23 NOTE — ED Triage Notes (Signed)
Pt presents w/ c/o n/v/d since this morning. Pt unable to state how many episodes of vomiting have occurred, diarrhea x 4.

## 2016-10-24 DIAGNOSIS — I10 Essential (primary) hypertension: Secondary | ICD-10-CM | POA: Diagnosis not present

## 2016-10-24 DIAGNOSIS — K219 Gastro-esophageal reflux disease without esophagitis: Secondary | ICD-10-CM | POA: Diagnosis not present

## 2016-10-24 DIAGNOSIS — Z23 Encounter for immunization: Secondary | ICD-10-CM | POA: Diagnosis not present

## 2016-10-24 DIAGNOSIS — E860 Dehydration: Secondary | ICD-10-CM | POA: Diagnosis not present

## 2016-10-24 DIAGNOSIS — G8929 Other chronic pain: Secondary | ICD-10-CM | POA: Diagnosis not present

## 2016-10-24 DIAGNOSIS — D61818 Other pancytopenia: Secondary | ICD-10-CM | POA: Diagnosis not present

## 2016-10-24 DIAGNOSIS — E876 Hypokalemia: Secondary | ICD-10-CM | POA: Diagnosis not present

## 2016-10-24 DIAGNOSIS — R112 Nausea with vomiting, unspecified: Secondary | ICD-10-CM | POA: Diagnosis not present

## 2016-10-24 DIAGNOSIS — I119 Hypertensive heart disease without heart failure: Secondary | ICD-10-CM | POA: Diagnosis not present

## 2016-10-24 LAB — BASIC METABOLIC PANEL
ANION GAP: 10 (ref 5–15)
BUN: 10 mg/dL (ref 6–20)
CALCIUM: 8.8 mg/dL — AB (ref 8.9–10.3)
CO2: 25 mmol/L (ref 22–32)
Chloride: 100 mmol/L — ABNORMAL LOW (ref 101–111)
Creatinine, Ser: 0.66 mg/dL (ref 0.44–1.00)
Glucose, Bld: 136 mg/dL — ABNORMAL HIGH (ref 65–99)
Potassium: 2.6 mmol/L — CL (ref 3.5–5.1)
SODIUM: 135 mmol/L (ref 135–145)

## 2016-10-24 LAB — POTASSIUM: POTASSIUM: 3 mmol/L — AB (ref 3.5–5.1)

## 2016-10-24 LAB — MAGNESIUM: Magnesium: 1.3 mg/dL — ABNORMAL LOW (ref 1.7–2.4)

## 2016-10-24 MED ORDER — POTASSIUM CHLORIDE IN NACL 40-0.9 MEQ/L-% IV SOLN
INTRAVENOUS | Status: AC
  Administered 2016-10-24: 75 mL/h via INTRAVENOUS
  Filled 2016-10-24: qty 1000

## 2016-10-24 MED ORDER — BUTALBITAL-APAP-CAFFEINE 50-325-40 MG PO TABS
1.0000 | ORAL_TABLET | Freq: Four times a day (QID) | ORAL | Status: DC | PRN
  Filled 2016-10-24: qty 1

## 2016-10-24 MED ORDER — LISINOPRIL 20 MG PO TABS
20.0000 mg | ORAL_TABLET | Freq: Every day | ORAL | Status: DC
  Administered 2016-10-24 – 2016-10-25 (×2): 20 mg via ORAL
  Filled 2016-10-24 (×2): qty 1

## 2016-10-24 MED ORDER — TIZANIDINE HCL 4 MG PO TABS
4.0000 mg | ORAL_TABLET | Freq: Three times a day (TID) | ORAL | Status: DC | PRN
  Filled 2016-10-24: qty 1

## 2016-10-24 MED ORDER — HYDRALAZINE HCL 20 MG/ML IJ SOLN
INTRAMUSCULAR | Status: AC
  Filled 2016-10-24: qty 1

## 2016-10-24 MED ORDER — ONDANSETRON HCL 4 MG PO TABS
4.0000 mg | ORAL_TABLET | Freq: Four times a day (QID) | ORAL | Status: DC | PRN
  Administered 2016-10-24 – 2016-10-25 (×3): 4 mg via ORAL
  Filled 2016-10-24 (×3): qty 1

## 2016-10-24 MED ORDER — POTASSIUM CHLORIDE 10 MEQ/100ML IV SOLN
10.0000 meq | INTRAVENOUS | Status: AC
  Administered 2016-10-24 (×2): 10 meq via INTRAVENOUS
  Filled 2016-10-24 (×2): qty 100

## 2016-10-24 MED ORDER — ENSURE ENLIVE PO LIQD
237.0000 mL | Freq: Two times a day (BID) | ORAL | Status: DC
  Administered 2016-10-25: 10:00:00 237 mL via ORAL

## 2016-10-24 MED ORDER — MAGNESIUM SULFATE 4 GM/100ML IV SOLN
4.0000 g | Freq: Once | INTRAVENOUS | Status: AC
  Administered 2016-10-24: 12:00:00 4 g via INTRAVENOUS
  Filled 2016-10-24: qty 100

## 2016-10-24 MED ORDER — MOMETASONE FURO-FORMOTEROL FUM 200-5 MCG/ACT IN AERO
2.0000 | INHALATION_SPRAY | Freq: Two times a day (BID) | RESPIRATORY_TRACT | Status: DC
  Administered 2016-10-24 – 2016-10-25 (×2): 2 via RESPIRATORY_TRACT
  Filled 2016-10-24: qty 8.8

## 2016-10-24 MED ORDER — AMLODIPINE BESYLATE 10 MG PO TABS
10.0000 mg | ORAL_TABLET | Freq: Every day | ORAL | Status: DC
  Administered 2016-10-24 – 2016-10-25 (×2): 10 mg via ORAL
  Filled 2016-10-24 (×2): qty 1

## 2016-10-24 MED ORDER — POTASSIUM CHLORIDE CRYS ER 20 MEQ PO TBCR
40.0000 meq | EXTENDED_RELEASE_TABLET | Freq: Two times a day (BID) | ORAL | Status: DC
  Administered 2016-10-24 – 2016-10-25 (×3): 40 meq via ORAL
  Filled 2016-10-24 (×3): qty 2

## 2016-10-24 MED ORDER — ACETAMINOPHEN 325 MG PO TABS: 650.0000 mg | ORAL_TABLET | Freq: Four times a day (QID) | ORAL | Status: DC | PRN

## 2016-10-24 MED ORDER — ACETAMINOPHEN 650 MG RE SUPP: 650.0000 mg | Freq: Four times a day (QID) | RECTAL | Status: DC | PRN

## 2016-10-24 MED ORDER — ADULT MULTIVITAMIN W/MINERALS CH
1.0000 | ORAL_TABLET | Freq: Every day | ORAL | Status: DC
  Administered 2016-10-25: 1 via ORAL
  Filled 2016-10-24: qty 1

## 2016-10-24 MED ORDER — POTASSIUM CHLORIDE 10 MEQ/100ML IV SOLN
10.0000 meq | Freq: Once | INTRAVENOUS | Status: AC
  Administered 2016-10-24: 10 meq via INTRAVENOUS

## 2016-10-24 MED ORDER — POTASSIUM CHLORIDE CRYS ER 20 MEQ PO TBCR: 40.0000 meq | EXTENDED_RELEASE_TABLET | Freq: Once | ORAL | Status: DC

## 2016-10-24 MED ORDER — SODIUM CHLORIDE 0.9 % IV SOLN
INTRAVENOUS | Status: DC
  Administered 2016-10-24: 01:00:00 via INTRAVENOUS

## 2016-10-24 MED ORDER — ONDANSETRON HCL 4 MG/2ML IJ SOLN
4.0000 mg | Freq: Four times a day (QID) | INTRAMUSCULAR | Status: DC | PRN
  Administered 2016-10-24: 09:00:00 4 mg via INTRAVENOUS
  Filled 2016-10-24: qty 2

## 2016-10-24 MED ORDER — OXYCODONE HCL 5 MG PO TABS
10.0000 mg | ORAL_TABLET | Freq: Four times a day (QID) | ORAL | Status: DC | PRN
  Administered 2016-10-24 – 2016-10-25 (×3): 10 mg via ORAL
  Filled 2016-10-24 (×3): qty 2

## 2016-10-24 MED ORDER — METHOCARBAMOL 1000 MG/10ML IJ SOLN
500.0000 mg | Freq: Once | INTRAMUSCULAR | Status: AC
  Administered 2016-10-24: 500 mg via INTRAVENOUS
  Filled 2016-10-24: qty 5

## 2016-10-24 MED ORDER — PROCHLORPERAZINE EDISYLATE 5 MG/ML IJ SOLN
5.0000 mg | INTRAMUSCULAR | Status: DC | PRN
  Administered 2016-10-24 (×3): 5 mg via INTRAVENOUS
  Filled 2016-10-24 (×4): qty 1

## 2016-10-24 MED ORDER — ASPIRIN 81 MG PO CHEW
81.0000 mg | CHEWABLE_TABLET | Freq: Every day | ORAL | Status: DC
  Administered 2016-10-24 – 2016-10-25 (×2): 81 mg via ORAL
  Filled 2016-10-24 (×2): qty 1

## 2016-10-24 MED ORDER — MORPHINE SULFATE ER 30 MG PO TBCR
60.0000 mg | EXTENDED_RELEASE_TABLET | Freq: Three times a day (TID) | ORAL | Status: DC
  Administered 2016-10-24 – 2016-10-25 (×3): 60 mg via ORAL
  Filled 2016-10-24 (×3): qty 2

## 2016-10-24 MED ORDER — PANTOPRAZOLE SODIUM 40 MG IV SOLR
40.0000 mg | INTRAVENOUS | Status: DC
  Administered 2016-10-24 – 2016-10-25 (×2): 40 mg via INTRAVENOUS
  Filled 2016-10-24 (×2): qty 40

## 2016-10-24 MED ORDER — ENOXAPARIN SODIUM 40 MG/0.4ML ~~LOC~~ SOLN
40.0000 mg | SUBCUTANEOUS | Status: DC
  Administered 2016-10-24: 40 mg via SUBCUTANEOUS
  Filled 2016-10-24: qty 0.4

## 2016-10-24 MED ORDER — PNEUMOCOCCAL VAC POLYVALENT 25 MCG/0.5ML IJ INJ
0.5000 mL | INJECTION | INTRAMUSCULAR | Status: AC
  Administered 2016-10-25: 0.5 mL via INTRAMUSCULAR
  Filled 2016-10-24: qty 0.5

## 2016-10-24 MED ORDER — HYDRALAZINE HCL 20 MG/ML IJ SOLN
10.0000 mg | INTRAMUSCULAR | Status: DC | PRN
  Administered 2016-10-24: 10 mg via INTRAVENOUS

## 2016-10-24 NOTE — Progress Notes (Signed)
ELECTROLYTE CONSULT NOTE - INITIAL   Pharmacy Consult for electrolytes Indication: hypokalemia, hypomagnesemia  Allergies  Allergen Reactions  . Sulfa Antibiotics Other (See Comments)    STEVEN JOHNSON SYNDROME    Patient Measurements: Height: 5\' 4"  (162.6 cm) Weight: 150 lb (68 kg) IBW/kg (Calculated) : 54.7  Labs:  Recent Labs  10/23/16 1954 10/24/16 0410  WBC 5.5  --   HGB 11.6*  --   HCT 32.8*  --   PLT 172  --   CREATININE 0.64 0.66  MG  --  1.3*  ALBUMIN 4.5  --   PROT 7.9  --   AST 29  --   ALT 14  --   ALKPHOS 57  --   BILITOT 0.8  --     Assessment: Pharmacy consulted to monitor and replace electrolytes if needed in this 71 year old female.   Goal of Therapy: Electrolytes WNL  Plan:  Mg = 1.3. Ordered magnesium 4 g IV once K = 2.6. Patient received KCl 30 mEq IV. Per RN, rate had to be slowed down for patient to tolerate which caused delay in magnesium administration. Will order another KCl 40 mEq PO dose and recheck K level.  Cindi CarbonMary M Darran Gabay, PharmD, BCPS Clinical Pharmacist 10/24/2016,12:45 PM

## 2016-10-24 NOTE — ED Notes (Signed)
PT up to the bathroom without difficulty. Pt had an episode of dry heaves while standing over toilet. Pt able to stop dry heaves without medication and is currently back in bed in NAD.

## 2016-10-24 NOTE — Progress Notes (Signed)
CH responded to an OR for prayer. RN stated that this is a rough morning for the Pt. Pt stated that she desired prayer, but is feeling sick and in pain. CH provided a short prayer, and assured the Pt that a CH will come back when she is feeling better.    10/24/16 1100  Clinical Encounter Type  Visited With Patient;Health care provider  Visit Type Initial;Spiritual support  Referral From Nurse  Consult/Referral To Chaplain  Spiritual Encounters  Spiritual Needs Prayer

## 2016-10-24 NOTE — Progress Notes (Signed)
ELECTROLYTE CONSULT NOTE - INITIAL   Pharmacy Consult for electrolytes Indication: hypokalemia, hypomagnesemia  Allergies  Allergen Reactions  . Sulfa Antibiotics Other (See Comments)    STEVEN JOHNSON SYNDROME    Patient Measurements: Height: 5\' 4"  (162.6 cm) Weight: 150 lb (68 kg) IBW/kg (Calculated) : 54.7  Labs:  Recent Labs  10/23/16 1954 10/24/16 0410  WBC 5.5  --   HGB 11.6*  --   HCT 32.8*  --   PLT 172  --   CREATININE 0.64 0.66  MG  --  1.3*  ALBUMIN 4.5  --   PROT 7.9  --   AST 29  --   ALT 14  --   ALKPHOS 57  --   BILITOT 0.8  --     Assessment: Pharmacy consulted to monitor and replace electrolytes if needed in this 71 year old female.   Goal of Therapy: Electrolytes WNL  Plan:  Mg = 1.3. Ordered magnesium 4 g IV once K = 2.6. Patient received KCl 30 mEq IV. Per RN, rate had to be slowed down for patient to tolerate which caused delay in magnesium administration. Will order another KCl 40 mEq PO dose and recheck K level.  ADD:  K resulted @ 3.0. MD ordered KCl 40 mEq Po BID. Will recheck with am labs.   Demetrius CharityJames,Kennie Karapetian D, PharmD, BCPS Clinical Pharmacist 10/24/2016,5:07 PM

## 2016-10-24 NOTE — Progress Notes (Signed)
Initial Nutrition Assessment  DOCUMENTATION CODES:   Severe malnutrition in context of chronic illness  INTERVENTION:  Provide Ensure Enlive po BID, each supplement provides 350 kcal and 20 grams of protein.   Provide daily multivitamin with minerals.   Encouraged intake of small, frequent meals throughout the day. Discussed choosing foods that are calorie- and protein-dense. Patient's diet has been lacking in protein and nutrients. Discussed options patient will enjoy and be able to incorporate.  NUTRITION DIAGNOSIS:   Malnutrition (Severe) related to chronic illness (chronic nausea, recent intractable vomiting) as evidenced by severe depletion of body fat, severe depletion of muscle mass.  GOAL:   Patient will meet greater than or equal to 90% of their needs  MONITOR:   PO intake, Supplement acceptance, Labs, Weight trends, I & O's  REASON FOR ASSESSMENT:   Malnutrition Screening Tool    ASSESSMENT:   71 year old female with PMHx of anemia, HTN, arthritis, OP, Stevens-Johnson syndrome who presented with intractable nausea and vomiting and found to have dehydration, hypokalemia and hypomagnesemia.    Spoke with patient and her son at bedside. Patient reports she has had intractable nausea and vomiting for a few days PTA. She ran out of her dramamine, which she normally takes for her chronic nausea. She reports she works two jobs. 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 She may skip breakfast or may have some toast with butter in the morning. If she is taking care of the little girl (cares for her at the child's house) she may have 1/2 of a sandwich when she makes the little girl lunch. She usually has take-out for dinner with son. May have some chicken at that meal, but reports she does not get a lot of protein. Patient is amenable to trying Ensure Enlive, but is not sure she will like it. Discussed some other options for helping to meet calorie  and protein needs. Patient denies any difficulty chewing even though she is almost edentulous and does not have dentures. She does not want food chopped. Denies trouble swallowing. Son brought patient food from [employer] tonight, so she did not order any food from here.  Patient is unsure of UBW. Per chart she was 143-147 lbs last year. On 08/24/2015 patient was 147.8 lbs. On 02/20/2016 patient was 142 lbs (unsure if this was a true measured weight. RD obtained bed scale weight of 135.5 lbs. Patient has lost 6.5 lbs (4.6% body weight) over the past 8 months, which is not significant for time frame.  Medications reviewed and include: pantoprazole, potassium chloride 40 mEq BID, NS with KCl 40 mEq/L @ 75 ml/hr.   Labs reviewed: Potassium 3, Chloride 100, Magnesium 1.3.  Nutrition-Focused physical exam completed. Findings are severe fat depletion (orbital region, upper arm region, thoracic/lumbar region), moderate-severe muscle depletion (moderate depletion of posterior calf region; severe depletion of temple region, clavicle bone region, clavicle/acromion bone region, scapular bone region, dorsal hand, patellar region, anterior thigh region), and no edema. Skin very dry. Edentulous except for a few top teeth.  Diet Order:  Diet 2 gram sodium Room service appropriate? Yes; Fluid consistency: Thin  Skin:  Reviewed, no issues  Last BM:  10/23/2016   Height:   Ht Readings from Last 1 Encounters:  10/24/16 5\' 4"  (1.626 m)    Weight:   Wt Readings from Last 1 Encounters:  10/24/16 135 lb 8 oz (61.5 kg)    Ideal Body Weight:  54.5 kg  BMI:  Body mass index is 23.26 kg/m.  Estimated Nutritional Needs:   Kcal:  1460-1685 (MSJ x 1.3-1.5)  Protein:  85-100 grams (1.4-1.6 grams/kg)  Fluid:  1.5-1.8 L/day (25-30 ml/kg)  EDUCATION NEEDS:   Education needs addressed  Helane Rima, MS, RD, LDN Pager: [phone] After Hours Pager: [phone]

## 2016-10-24 NOTE — Progress Notes (Signed)
CRITICAL VALUE ALERT  Critical Value:  Potassium 2.6  Date & Time Notied:  10/24/2016 0539  Provider Notified: MD on call  Orders Received/Actions taken: IV replacement potassium

## 2016-10-24 NOTE — Progress Notes (Signed)
[facility] - [place] at [facility]   PATIENT NAME: Alison Price    MR#:  [id number]  DATE OF BIRTH:  July 20, 1945  SUBJECTIVE:  CHIEF COMPLAINT:   Chief Complaint  Patient presents with  . Emesis     He came with complaint of nausea and vomiting and not able to eat enough for last few days. Noted to have low potassium and magnesium. Today morning she was upset as we did not resume her home pain medications and she refused forall medications this morning until we restarted her pain medications.  REVIEW OF SYSTEMS:  CONSTITUTIONAL: No fever, positive for fatigue or weakness.  EYES: No blurred or double vision.  EARS, NOSE, AND THROAT: No tinnitus or ear pain.  RESPIRATORY: No cough, shortness of breath, wheezing or hemoptysis.  CARDIOVASCULAR: No chest pain, orthopnea, edema.  GASTROINTESTINAL: No nausea, vomiting, diarrhea or abdominal pain.  GENITOURINARY: No dysuria, hematuria.  ENDOCRINE: No polyuria, nocturia,  HEMATOLOGY: No anemia, easy bruising or bleeding SKIN: No rash or lesion. MUSCULOSKELETAL: No joint pain or arthritis.   NEUROLOGIC: No tingling, numbness, weakness.  PSYCHIATRY: No anxiety or depression.   ROS  DRUG ALLERGIES:   Allergies  Allergen Reactions  . Sulfa Antibiotics Other (See Comments)    STEVEN JOHNSON SYNDROME    VITALS:  Blood pressure (!) 174/82, pulse 81, temperature 98.1 F (36.7 C), temperature source Oral, resp. rate 18, height  (1.626 m), weight 68 kg (150 lb), SpO2 98 %.  PHYSICAL EXAMINATION:  GENERAL:  71 y.o.-year-old patient lying in the bed with no acute distress.  EYES: Pupils equal, round, reactive to light and accommodation. No scleral icterus. Extraocular muscles intact.  HEENT: Head atraumatic, normocephalic. Oropharynx and nasopharynx clear.  NECK:  Supple, no jugular venous distention. No thyroid enlargement, no tenderness.  LUNGS: Normal breath sounds bilaterally, no wheezing, rales,rhonchi or  crepitation. No use of accessory muscles of respiration.  CARDIOVASCULAR: S1, S2 normal. No murmurs, rubs, or gallops.  ABDOMEN: Soft, nontender, nondistended. Bowel sounds present. No organomegaly or mass.  EXTREMITIES: No pedal edema, cyanosis, or clubbing.  NEUROLOGIC: Cranial nerves II through XII are intact. Muscle strength 5/5 in all extremities. Sensation intact. Gait not checked.  PSYCHIATRIC: The patient is alert and oriented x 3.  SKIN: No obvious rash, lesion, or ulcer.   Physical Exam LABORATORY PANEL:   CBC  Recent Labs Lab 10/23/16 1954  WBC 5.5  HGB 11.6*  HCT 32.8*  PLT 172   ------------------------------------------------------------------------------------------------------------------  Chemistries   Recent Labs Lab 10/23/16 1954 10/24/16 0410  NA 139 135  K 2.9* 2.6*  CL 103 100*  CO2 27 25  GLUCOSE 135* 136*  BUN 15 10  CREATININE 0.64 0.66  CALCIUM 9.5 8.8*  MG  --  1.3*  AST 29  --   ALT 14  --   ALKPHOS 57  --   BILITOT 0.8  --    ------------------------------------------------------------------------------------------------------------------  Cardiac Enzymes  Recent Labs Lab 10/23/16 1954  TROPONINI <0.03   ------------------------------------------------------------------------------------------------------------------  RADIOLOGY:  Dg Chest Portable 1 View  Result Date: 10/23/2016 CLINICAL DATA:  Nausea, vomiting and diarrhea. EXAM: PORTABLE CHEST 1 VIEW COMPARISON:  Chest radiograph 02/20/2016, 12/11/2015. Abdominal CT 11/10/2011 FINDINGS: Unchanged heart size and mediastinal contours with mild cardiomegaly and tortuous atherosclerotic thoracic aorta. Ill-defined right infrahilar opacity is likely atelectasis/scarring as seen on lung bases from prior abdominal CT. Left basilar atelectasis or scarring. No confluent consolidation. No pleural fluid. No pulmonary edema. No pneumothorax. Air-filled  colon in the right upper quadrant is  similar to prior exams. IMPRESSION: 1. Bibasilar atelectasis or scarring.  No acute abnormality. 2. Mild cardiomegaly with tortuous atherosclerotic thoracic aorta, unchanged. Electronically Signed   By: Rubye OaksMelanie  Ehinger M.D.   On: 10/23/2016 20:07    ASSESSMENT AND PLAN:   Principal Problem:   Intractable nausea and vomiting Active Problems:   Hypokalemia   Dehydration   HTN (hypertension)  * Intractable nausea and vomiting -   when necessary antiemetics, IV fluids  *  Hypokalemia - replace and monitor * hypomagnesemia, replace.  *  Dehydration - IV fluids *  HTN (hypertension) - continue home meds *  GERD - home dose PPI * chronic pain- resume her pain medications from home.  All the records are reviewed and case discussed with Care Management/Social Workerr. Management plans discussed with the patient, family and they are in agreement.  CODE STATUS: Full code.  TOTAL TIME TAKING CARE OF THIS PATIENT: 70 minutes.     POSSIBLE D/C IN 1-2 DAYS, DEPENDING ON CLINICAL CONDITION.   Altamese DillingVACHHANI, Chung Chagoya M.D on 10/24/2016   Between 7am to 6pm - Pager - [phone]  After 6pm go to [email or web address] - password [email or web address]  [facility] [place] [facility]  Office  [phone]  CC: Primary care physician; Jaclyn Shaggyate, Denny C, MD  Note: This dictation was prepared with Dragon dictation along with smaller phrase technology. Any transcriptional errors that result from this process are unintentional.

## 2016-10-25 DIAGNOSIS — I119 Hypertensive heart disease without heart failure: Secondary | ICD-10-CM | POA: Diagnosis not present

## 2016-10-25 DIAGNOSIS — D61818 Other pancytopenia: Secondary | ICD-10-CM | POA: Diagnosis not present

## 2016-10-25 DIAGNOSIS — I10 Essential (primary) hypertension: Secondary | ICD-10-CM | POA: Diagnosis not present

## 2016-10-25 DIAGNOSIS — R112 Nausea with vomiting, unspecified: Secondary | ICD-10-CM | POA: Diagnosis not present

## 2016-10-25 DIAGNOSIS — E876 Hypokalemia: Secondary | ICD-10-CM | POA: Diagnosis not present

## 2016-10-25 DIAGNOSIS — K219 Gastro-esophageal reflux disease without esophagitis: Secondary | ICD-10-CM | POA: Diagnosis not present

## 2016-10-25 DIAGNOSIS — Z23 Encounter for immunization: Secondary | ICD-10-CM | POA: Diagnosis not present

## 2016-10-25 DIAGNOSIS — G8929 Other chronic pain: Secondary | ICD-10-CM | POA: Diagnosis not present

## 2016-10-25 DIAGNOSIS — E860 Dehydration: Secondary | ICD-10-CM | POA: Diagnosis not present

## 2016-10-25 LAB — BASIC METABOLIC PANEL
Anion gap: 6 (ref 5–15)
BUN: 18 mg/dL (ref 6–20)
CHLORIDE: 104 mmol/L (ref 101–111)
CO2: 27 mmol/L (ref 22–32)
CREATININE: 1.07 mg/dL — AB (ref 0.44–1.00)
Calcium: 8.7 mg/dL — ABNORMAL LOW (ref 8.9–10.3)
GFR calc Af Amer: 60 mL/min — ABNORMAL LOW (ref >60)
GFR calc non Af Amer: 51 mL/min — ABNORMAL LOW (ref >60)
GLUCOSE: 106 mg/dL — AB (ref 65–99)
Potassium: 4.3 mmol/L (ref 3.5–5.1)
SODIUM: 137 mmol/L (ref 135–145)

## 2016-10-25 LAB — MAGNESIUM: MAGNESIUM: 2.2 mg/dL (ref 1.7–2.4)

## 2016-10-25 MED ORDER — ONDANSETRON HCL 4 MG PO TABS: 4.0000 mg | ORAL_TABLET | Freq: Three times a day (TID) | ORAL | 4 refills | Status: DC | PRN

## 2016-10-25 MED ORDER — ALUM & MAG HYDROXIDE-SIMETH 200-200-20 MG/5ML PO SUSP
30.0000 mL | Freq: Four times a day (QID) | ORAL | Status: DC | PRN
  Administered 2016-10-25: 30 mL via ORAL
  Filled 2016-10-25: qty 30

## 2016-10-25 NOTE — Progress Notes (Signed)
Pt has been discharged home. Discharge papers given and explained to pt. Pt verbalized understanding. Meds and f/u appointment reviewed with pt. RX given. Pt escorted on a wheelchair.

## 2016-10-25 NOTE — Progress Notes (Addendum)
ELECTROLYTE CONSULT NOTE - INITIAL   Pharmacy Consult for electrolytes Indication: hypokalemia, hypomagnesemia  Allergies  Allergen Reactions  . Sulfa Antibiotics Other (See Comments)    STEVEN JOHNSON SYNDROME    Patient Measurements: Height: 5\' 4"  (162.6 cm) Weight: 135 lb 8 oz (61.5 kg) (bed scale) IBW/kg (Calculated) : 54.7  Labs:  Recent Labs  10/23/16 1954 10/24/16 0410 10/25/16 0336  WBC 5.5  --   --   HGB 11.6*  --   --   HCT 32.8*  --   --   PLT 172  --   --   CREATININE 0.64 0.66 1.07*  MG  --  1.3* 2.2  ALBUMIN 4.5  --   --   PROT 7.9  --   --   AST 29  --   --   ALT 14  --   --   ALKPHOS 57  --   --   BILITOT 0.8  --   --     Assessment: Pharmacy consulted to monitor and replace electrolytes if needed in this 71 year old female.   Goal of Therapy: Electrolytes WNL  Plan:  Mg = 1.3. Ordered magnesium 4 g IV once K = 2.6. Patient received KCl 30 mEq IV. Per RN, rate had to be slowed down for patient to tolerate which caused delay in magnesium administration. Will order another KCl 40 mEq PO dose and recheck K level.  ADD:  K resulted @ 3.0. MD ordered KCl 40 mEq Po BID. Will recheck with am labs.   0908 AM K+ and Mg WNL. Recheck BMP with tomorrow AM labs.  Festus Pursel S, PharmD, BCPS Clinical Pharmacist 10/25/2016,5:59 AM

## 2016-10-25 NOTE — Discharge Summary (Signed)
[facility] - [place] at [facility]   PATIENT NAME: [facility]    MR#:  [id number]  DATE OF BIRTH:  May 14, 1945  DATE OF ADMISSION:  10/23/2016   ADMITTING PHYSICIAN: Oralia Manis, MD  DATE OF DISCHARGE: 10/25/2016 PRIMARY CARE PHYSICIAN: Jaclyn Shaggy, MD   ADMISSION DIAGNOSIS:  Hypokalemia [E87.6] Diarrhea, unspecified type [R19.7] Intractable vomiting with nausea, unspecified vomiting type [R11.2] DISCHARGE DIAGNOSIS:  Principal Problem:   Intractable nausea and vomiting Active Problems:   Hypokalemia   Dehydration   HTN (hypertension)  SECONDARY DIAGNOSIS:   Past Medical History:  Diagnosis Date  . Anemia   . Arthritis   . Hypertension   . Osteoporosis   . Stevens-Johnson syndrome [facility])    HOSPITAL COURSE:   * Intractable nausea and vomiting -   improved withwhen necessary antiemetics, IV fluids  *Hypokalemia - replaced and improved. * hypomagnesemia, replaced and improved.  *Dehydration - IV fluids *HTN (hypertension) - continue home meds *GERD - home dose PPI * chronic pain- resume her pain medications from home.  DISCHARGE CONDITIONS:  Stable, discharge to home today. CONSULTS OBTAINED:   DRUG ALLERGIES:   Allergies  Allergen Reactions  . Sulfa Antibiotics Other (See Comments)    STEVEN JOHNSON SYNDROME   DISCHARGE MEDICATIONS:   Allergies as of 10/25/2016      Reactions   Sulfa Antibiotics Other (See Comments)   STEVEN JOHNSON SYNDROME      Medication List    TAKE these medications   albuterol-ipratropium 18-103 MCG/ACT inhaler Commonly known as:  COMBIVENT Inhale 2 puffs into the lungs every 6 (six) hours as needed for wheezing or shortness of breath.   amLODipine 10 MG tablet Commonly known as:  NORVASC Take 10 mg by mouth daily.   aspirin 81 MG tablet Take 81 mg by mouth daily.   butalbital-acetaminophen-caffeine 50-325-40 MG tablet Commonly known as:  FIORICET, ESGIC Take 1 tablet by mouth  every 6 (six) hours as needed for headache or migraine.   conjugated estrogens vaginal cream Commonly known as:  PREMARIN 1 gram intravaginal BIW   etodolac 400 MG tablet Commonly known as:  LODINE Take 400 mg by mouth 2 (two) times daily.   Fenofibric Acid 135 MG Cpdr   fexofenadine 180 MG tablet Commonly known as:  ALLEGRA Take 180 mg by mouth daily.   Fluticasone-Salmeterol 250-50 MCG/DOSE Aepb Commonly known as:  ADVAIR Inhale 1 puff into the lungs 2 (two) times daily.   levofloxacin 500 MG tablet Commonly known as:  LEVAQUIN Take 500 mg by mouth daily.   lisinopril 20 MG tablet Commonly known as:  PRINIVIL,ZESTRIL Take 20 mg by mouth daily.   meclizine 25 MG tablet Commonly known as:  ANTIVERT Take 25 mg by mouth 2 (two) times daily as needed for dizziness.   morphine 60 MG 12 hr tablet Commonly known as:  MS CONTIN Take 60 mg by mouth 3 (three) times daily.   multivitamin tablet Take 1 tablet by mouth daily.   ondansetron 4 MG tablet Commonly known as:  ZOFRAN Take 1 tablet (4 mg total) by mouth every 8 (eight) hours as needed for nausea or vomiting.   Oxycodone HCl 10 MG Tabs Take 10 mg by mouth 3 (three) times daily.   oxyCODONE-acetaminophen 5-325 MG tablet Commonly known as:  ROXICET Take 1 tablet by mouth every 6 (six) hours as needed.   pantoprazole 40 MG tablet Commonly known as:  PROTONIX Take 40 mg by mouth daily.  SLOW FE 142 (45 Fe) MG Tbcr Generic drug:  Ferrous Sulfate Take 1 tablet by mouth daily.   tiZANidine 4 MG tablet Commonly known as:  ZANAFLEX Take 4 mg by mouth every 6 (six) hours as needed.            Discharge Care Instructions        Start     Ordered   10/25/16 0000  Increase activity slowly     10/25/16 0957   10/25/16 0000  Diet - low sodium heart healthy     10/25/16 0957   10/25/16 0000  ondansetron (ZOFRAN) 4 MG tablet  Every 8 hours PRN     10/25/16 1013       DISCHARGE INSTRUCTIONS:  See  AVS.  If you experience worsening of your admission symptoms, develop shortness of breath, life threatening emergency, suicidal or homicidal thoughts you must seek medical attention immediately by calling 911 or calling your MD immediately  if symptoms less severe.  You Must read complete instructions/literature along with all the possible adverse reactions/side effects for all the Medicines you take and that have been prescribed to you. Take any new Medicines after you have completely understood and accpet all the possible adverse reactions/side effects.   Please note  You were cared for by a hospitalist during your hospital stay. If you have any questions about your discharge medications or the care you received while you were in the hospital after you are discharged, you can call the unit and asked to speak with the hospitalist on call if the hospitalist that took care of you is not available. Once you are discharged, your primary care physician will handle any further medical issues. Please note that NO REFILLS for any discharge medications will be authorized once you are discharged, as it is imperative that you return to your primary care physician (or establish a relationship with a primary care physician if you do not have one) for your aftercare needs so that they can reassess your need for medications and monitor your lab values.    On the day of Discharge:  VITAL SIGNS:  Blood pressure 114/68, pulse 71, temperature 97.7 F (36.5 C), temperature source Oral, resp. rate 18, height  (1.626 m), weight 135 lb 8 oz (61.5 kg), SpO2 100 %. PHYSICAL EXAMINATION:  GENERAL:  71 y.o.-year-old patient lying in the bed with no acute distress.  EYES: Pupils equal, round, reactive to light and accommodation. No scleral icterus. Extraocular muscles intact.  HEENT: Head atraumatic, normocephalic. Oropharynx and nasopharynx clear.  NECK:  Supple, no jugular venous distention. No thyroid enlargement,  no tenderness.  LUNGS: Normal breath sounds bilaterally, no wheezing, rales,rhonchi or crepitation. No use of accessory muscles of respiration.  CARDIOVASCULAR: S1, S2 normal. No murmurs, rubs, or gallops.  ABDOMEN: Soft, non-tender, non-distended. Bowel sounds present. No organomegaly or mass.  EXTREMITIES: No pedal edema, cyanosis, or clubbing.  NEUROLOGIC: Cranial nerves II through XII are intact. Muscle strength 5/5 in all extremities. Sensation intact. Gait not checked.  PSYCHIATRIC: The patient is alert and oriented x 3.  SKIN: No obvious rash, lesion, or ulcer.  DATA REVIEW:   CBC  Recent Labs Lab 10/23/16 1954  WBC 5.5  HGB 11.6*  HCT 32.8*  PLT 172    Chemistries   Recent Labs Lab 10/23/16 1954  10/25/16 0336  NA 139  < > 137  K 2.9*  < > 4.3  CL 103  < > 104  CO2 27  < >  27  GLUCOSE 135*  < > 106*  BUN 15  < > 18  CREATININE 0.64  < > 1.07*  CALCIUM 9.5  < > 8.7*  MG  --   < > 2.2  AST 29  --   --   ALT 14  --   --   ALKPHOS 57  --   --   BILITOT 0.8  --   --   < > = values in this interval not displayed.   Microbiology Results  Results for orders placed or performed in visit on 06/26/15  Urine culture     Status: None   Collection Time: 06/26/15  3:27 PM  Result Value Ref Range Status   Urine Culture, Routine Final report  Final   Organism ID, Bacteria No growth  Final    RADIOLOGY:  No results found.   Management plans discussed with the patient, family and they are in agreement.  CODE STATUS: Full Code   TOTAL TIME TAKING CARE OF THIS PATIENT: 28 minutes.    Shaune Pollackhen, Halford Goetzke M.D on 10/25/2016 at 1:42 PM  Between 7am to 6pm - Pager - [phone]  After 6pm go to [email or web address] - Social research officer, governmentpassword [email or web address]  [facility]  Office  [phone]  CC: Primary care physician; Jaclyn Shaggyate, Denny C, MD   Note: This dictation was prepared with Dragon dictation along with smaller phrase technology. Any transcriptional errors that result  from this process are unintentional.

## 2016-10-30 ENCOUNTER — Emergency Department: Payer: 59

## 2016-10-30 ENCOUNTER — Emergency Department
Admission: EM | Admit: 2016-10-30 | Discharge: 2016-10-30 | Disposition: A | Discharge status: Home / Self Care | Payer: 59 | Attending: Emergency Medicine | Admitting: Emergency Medicine

## 2016-10-30 ENCOUNTER — Encounter: Payer: Self-pay | Admitting: Emergency Medicine

## 2016-10-30 DIAGNOSIS — Z7982 Long term (current) use of aspirin: Secondary | ICD-10-CM | POA: Diagnosis not present

## 2016-10-30 DIAGNOSIS — S42125A Nondisplaced fracture of acromial process, left shoulder, initial encounter for closed fracture: Secondary | ICD-10-CM | POA: Diagnosis not present

## 2016-10-30 DIAGNOSIS — I10 Essential (primary) hypertension: Secondary | ICD-10-CM | POA: Insufficient documentation

## 2016-10-30 DIAGNOSIS — S0990XA Unspecified injury of head, initial encounter: Secondary | ICD-10-CM | POA: Diagnosis not present

## 2016-10-30 DIAGNOSIS — Y939 Activity, unspecified: Secondary | ICD-10-CM | POA: Diagnosis not present

## 2016-10-30 DIAGNOSIS — M545 Low back pain: Secondary | ICD-10-CM | POA: Diagnosis not present

## 2016-10-30 DIAGNOSIS — S3992XA Unspecified injury of lower back, initial encounter: Secondary | ICD-10-CM | POA: Diagnosis not present

## 2016-10-30 DIAGNOSIS — Z79899 Other long term (current) drug therapy: Secondary | ICD-10-CM | POA: Insufficient documentation

## 2016-10-30 DIAGNOSIS — Y999 Unspecified external cause status: Secondary | ICD-10-CM | POA: Insufficient documentation

## 2016-10-30 DIAGNOSIS — M7989 Other specified soft tissue disorders: Secondary | ICD-10-CM | POA: Diagnosis not present

## 2016-10-30 DIAGNOSIS — R410 Disorientation, unspecified: Secondary | ICD-10-CM | POA: Insufficient documentation

## 2016-10-30 DIAGNOSIS — S42035A Nondisplaced fracture of lateral end of left clavicle, initial encounter for closed fracture: Secondary | ICD-10-CM | POA: Diagnosis not present

## 2016-10-30 DIAGNOSIS — S4992XA Unspecified injury of left shoulder and upper arm, initial encounter: Secondary | ICD-10-CM | POA: Diagnosis not present

## 2016-10-30 DIAGNOSIS — Y929 Unspecified place or not applicable: Secondary | ICD-10-CM | POA: Diagnosis not present

## 2016-10-30 DIAGNOSIS — Z79891 Long term (current) use of opiate analgesic: Secondary | ICD-10-CM | POA: Diagnosis not present

## 2016-10-30 DIAGNOSIS — W19XXXA Unspecified fall, initial encounter: Secondary | ICD-10-CM | POA: Diagnosis not present

## 2016-10-30 DIAGNOSIS — M25512 Pain in left shoulder: Secondary | ICD-10-CM | POA: Diagnosis not present

## 2016-10-30 DIAGNOSIS — S8992XA Unspecified injury of left lower leg, initial encounter: Secondary | ICD-10-CM | POA: Diagnosis not present

## 2016-10-30 LAB — CBC
HCT: 28.6 % — ABNORMAL LOW (ref 35.0–47.0)
Hemoglobin: 10 g/dL — ABNORMAL LOW (ref 12.0–16.0)
MCH: 31.4 pg (ref 26.0–34.0)
MCHC: 35 g/dL (ref 32.0–36.0)
MCV: 89.6 fL (ref 80.0–100.0)
PLATELETS: 142 10*3/uL — AB (ref 150–440)
RBC: 3.19 MIL/uL — ABNORMAL LOW (ref 3.80–5.20)
RDW: 13.5 % (ref 11.5–14.5)
WBC: 4.4 10*3/uL (ref 3.6–11.0)

## 2016-10-30 LAB — COMPREHENSIVE METABOLIC PANEL
ALT: 15 U/L (ref 14–54)
AST: 25 U/L (ref 15–41)
Albumin: 4.3 g/dL (ref 3.5–5.0)
Alkaline Phosphatase: 70 U/L (ref 38–126)
Anion gap: 10 (ref 5–15)
BILIRUBIN TOTAL: 0.7 mg/dL (ref 0.3–1.2)
BUN: 41 mg/dL — AB (ref 6–20)
CO2: 26 mmol/L (ref 22–32)
CREATININE: 1.38 mg/dL — AB (ref 0.44–1.00)
Calcium: 9.6 mg/dL (ref 8.9–10.3)
Chloride: 99 mmol/L — ABNORMAL LOW (ref 101–111)
GFR calc Af Amer: 44 mL/min — ABNORMAL LOW (ref >60)
GFR, EST NON AFRICAN AMERICAN: 38 mL/min — AB (ref >60)
Glucose, Bld: 125 mg/dL — ABNORMAL HIGH (ref 65–99)
Potassium: 4.1 mmol/L (ref 3.5–5.1)
SODIUM: 135 mmol/L (ref 135–145)
TOTAL PROTEIN: 7.7 g/dL (ref 6.5–8.1)

## 2016-10-30 LAB — URINALYSIS, COMPLETE (UACMP) WITH MICROSCOPIC
BILIRUBIN URINE: NEGATIVE
Bacteria, UA: NONE SEEN
Glucose, UA: NEGATIVE mg/dL
Ketones, ur: NEGATIVE mg/dL
LEUKOCYTES UA: NEGATIVE
NITRITE: NEGATIVE
PH: 5.5 (ref 5.0–8.0)
Protein, ur: NEGATIVE mg/dL
SPECIFIC GRAVITY, URINE: 1.02 (ref 1.005–1.030)

## 2016-10-30 LAB — TROPONIN I

## 2016-10-30 MED ORDER — ONDANSETRON 4 MG PO TBDP
8.0000 mg | ORAL_TABLET | Freq: Once | ORAL | Status: AC
  Administered 2016-10-30: 8 mg via ORAL
  Filled 2016-10-30: qty 2

## 2016-10-30 MED ORDER — OXYCODONE-ACETAMINOPHEN 5-325 MG PO TABS
1.0000 | ORAL_TABLET | Freq: Once | ORAL | Status: AC
  Administered 2016-10-30: 1 via ORAL
  Filled 2016-10-30: qty 1

## 2016-10-30 NOTE — ED Notes (Signed)
Pt in radiology 

## 2016-10-30 NOTE — Discharge Instructions (Signed)
Return to the ER for new or worsening confusion, weakness, fevers, dizziness, chest pain, difficulty breathing, or any other new or worsening symptoms that concern you.   You should use the sling until you follow up with orthopedic doctor.  Your kidney function is slightly worsened from your prior admission.  Make sure to stay well hydrated, and follow up with your doctor in 1-2 weeks at which time it should be rechecked.

## 2016-10-30 NOTE — ED Notes (Signed)
Pt verbalizes understanding of discharge instructions.

## 2016-10-30 NOTE — ED Notes (Signed)
Patient two person assisted to restroom.  Pain noted for patient in the left knee.

## 2016-10-30 NOTE — ED Provider Notes (Signed)
[facility] Emergency Department Provider Note ____________________________________________   First MD Initiated Contact with Patient 10/30/16 1622     (approximate)  I have reviewed the triage vital signs and the nursing notes.   HISTORY  Chief Complaint Fall and Altered Mental Status    HPI Alison Price is a 71 y.o. female with a past medical history of hypertension, anemia, arthritis, status post recent admission for hypokalemia with nausea and vomiting, who presents with altered mental status for approximately 2-3 days, described by her son as being mildly confused, and by patient as being "slightly off."  per patient she has had multiple associated falls over the last day either 2 or 3 times. Patient states that she remembers the falls but is not sure what precipitated them. Patient reports associated left shoulder and left knee injuries. She denies associated fever, nausea, vomiting, or diarrhea, urinary symptoms, weakness, numbness, chest pain, or difficulty breathing.  She does report mild headache.   Past Medical History:  Diagnosis Date  . Anemia   . Arthritis   . Hypertension   . Osteoporosis   . Stevens-Johnson syndrome [facility])     Patient Active Problem List   Diagnosis Date Noted  . Intractable nausea and vomiting 10/23/2016  . Hypokalemia 10/23/2016  . Dehydration 10/23/2016  . HTN (hypertension) 10/23/2016  . History of total abdominal hysterectomy 06/26/2015  . Pelvic pressure in female 06/26/2015  . Vaginal atrophy 06/26/2015  . Hematuria 06/26/2015  . Dysuria 06/26/2015  . Pancytopenia (HCC) 04/13/2015    Past Surgical History:  Procedure Laterality Date  . ABDOMINAL HYSTERECTOMY     partial  . BACK SURGERY    . FOOT SURGERY    . HAND SURGERY     THUMB REPAIR  . HEMORRHOID SURGERY    . POSTERIOR REPAIR      Prior to Admission medications   Medication Sig Start Date End Date Taking? Authorizing Provider    albuterol-ipratropium (COMBIVENT) 18-103 MCG/ACT inhaler Inhale 2 puffs into the lungs every 6 (six) hours as needed for wheezing or shortness of breath.    [provider]  amLODipine (NORVASC) 10 MG tablet Take 10 mg by mouth daily.  04/09/15   [provider]  aspirin 81 MG tablet Take 81 mg by mouth daily.    [provider]  butalbital-acetaminophen-caffeine (FIORICET, ESGIC) 50-325-40 MG tablet Take 1 tablet by mouth every 6 (six) hours as needed for headache or migraine.    [provider]  Choline Fenofibrate (FENOFIBRIC ACID) 135 MG CPDR  04/09/15   [provider]  conjugated estrogens (PREMARIN) vaginal cream 1 gram intravaginal BIW Patient not taking: Reported on 10/23/2016 06/26/15   Defrancesco, Prentice Docker, MD  etodolac (LODINE) 400 MG tablet Take 400 mg by mouth 2 (two) times daily.    [provider]  Ferrous Sulfate (SLOW FE) 142 (45 Fe) MG TBCR Take 1 tablet by mouth daily.    [provider]  fexofenadine (ALLEGRA) 180 MG tablet Take 180 mg by mouth daily.    [provider]  Fluticasone-Salmeterol (ADVAIR) 250-50 MCG/DOSE AEPB Inhale 1 puff into the lungs 2 (two) times daily.    [provider]  levofloxacin (LEVAQUIN) 500 MG tablet Take 500 mg by mouth daily. 10/21/16 11/02/16  [provider]  lisinopril (PRINIVIL,ZESTRIL) 20 MG tablet Take 20 mg by mouth daily.    [provider]  meclizine (ANTIVERT) 25 MG tablet Take 25 mg by mouth 2 (two)  times daily as needed for dizziness.    [provider]  morphine (MS CONTIN) 60 MG 12 hr tablet Take 60 mg by mouth 3 (three) times daily.  03/15/15   [provider]  Multiple Vitamin (MULTIVITAMIN) tablet Take 1 tablet by mouth daily.    [provider]  ondansetron (ZOFRAN) 4 MG tablet Take 1 tablet (4 mg total) by mouth every 8 (eight) hours as needed for nausea or vomiting. 10/25/16   Shaune Pollackhen, Qing, MD  Oxycodone HCl 10 MG  TABS Take 10 mg by mouth 3 (three) times daily.    [provider]  oxyCODONE-acetaminophen (ROXICET) 5-325 MG tablet Take 1 tablet by mouth every 6 (six) hours as needed. 02/20/16   Minna AntisPaduchowski, Kevin, MD  pantoprazole (PROTONIX) 40 MG tablet Take 40 mg by mouth daily.     [provider]  tiZANidine (ZANAFLEX) 4 MG tablet Take 4 mg by mouth every 6 (six) hours as needed.  01/17/15   [provider]    Allergies Sulfa antibiotics  Family History  Problem Relation Age of Onset  . Diabetes Paternal Grandmother   . Uterine cancer Maternal Aunt   . Stomach cancer Maternal Aunt   . Breast cancer Neg Hx   . Ovarian cancer Neg Hx   . Colon cancer Neg Hx   . Heart disease Neg Hx     Social History Social History  Substance Use Topics  . Smoking status: Never Smoker  . Smokeless tobacco: Never Used  . Alcohol use No    Review of Systems  Constitutional: No fever/chills Eyes: No visual changes. ENT: No sore throat. Cardiovascular: Denies chest pain. Respiratory: Denies shortness of breath. Gastrointestinal: No nausea, no vomiting.  No diarrhea.  Genitourinary: Negative for dysuria.  Musculoskeletal: Positive for L knee and shoulder pain Skin: Negative for rash. Neurological:Positive for headaches, negative for focal weakness or numbness.   ____________________________________________   PHYSICAL EXAM:  VITAL SIGNS: ED Triage Vitals  Enc Vitals Group     BP 10/30/16 1559 (!) 170/79     Pulse Rate 10/30/16 1559 89     Resp 10/30/16 1559 18     Temp 10/30/16 1559 99 F (37.2 C)     Temp Source 10/30/16 1559 Oral     SpO2 10/30/16 1559 99 %     Weight 10/30/16 1559 135 lb (61.2 kg)     Height 10/30/16 1559 5\' 4"  (1.626 m)     Head Circumference --      Peak Flow --      Pain Score 10/30/16 1558 9     Pain Loc --      Pain Edu? --      Excl. in GC? --     Constitutional: Alert and oriented. Well appearing and in no acute distress. Eyes:  Conjunctivae are normal.  EOMI.  PERRLA.  Head: Atraumatic. Nose: No congestion/rhinnorhea. Mouth/Throat: Mucous membranes are moist.   Neck: Normal range of motion. Cspine nontender.   Cardiovascular: Normal rate, regular rhythm. Grossly normal heart sounds.  Good peripheral circulation. Respiratory: Normal respiratory effort.  No retractions. Lungs CTAB. Gastrointestinal: Soft and nontender. No distention.  Genitourinary: No CVA tenderness. Musculoskeletal: No lower extremity edema.  Extremities warm and well perfused. L shoulder with bruising to proximal humerus, tender to lateral part of shoulder, but no deformity.  2+ rad pulse, cap refill <2sec, intact motor and fine touch in med/rad/uln.  L knee with significant effusion, ROM limited by pain, but pt  able to range slightly.  Extensor mechanism intact.  2+ DP pulse, intact distal motor and fine touch.  Hips and pelvis nontender.  FROM at all other joints.  Mild mildline tenderness to Lspine, Tspine nontender.   Neurologic:  Normal speech and language. No gross focal neurologic deficits are appreciated. 5/5 motor strength and intact sensation in all extremities.  Normal coordination and finger to nose.  CNs III-XII intact.  Skin:  Skin is warm and dry. No rash noted. Psychiatric: Mood and affect are normal. Speech and behavior are normal. Oriented x3.    ____________________________________________   LABS (all labs ordered are listed, but only abnormal results are displayed)  Labs Reviewed  COMPREHENSIVE METABOLIC PANEL - Abnormal; Notable for the following:       Result Value   Chloride 99 (*)    Glucose, Bld 125 (*)    BUN 41 (*)    Creatinine, Ser 1.38 (*)    GFR calc non Af Amer 38 (*)    GFR calc Af Amer 44 (*)    All other components within normal limits  CBC - Abnormal; Notable for the following:    RBC 3.19 (*)    Hemoglobin 10.0 (*)    HCT 28.6 (*)    Platelets 142 (*)    All other components within normal limits    URINALYSIS, COMPLETE (UACMP) WITH MICROSCOPIC - Abnormal; Notable for the following:    APPearance HAZY (*)    Hgb urine dipstick MODERATE (*)    Squamous Epithelial / LPF 0-5 (*)    All other components within normal limits  TROPONIN I   ____________________________________________  EKG  ED ECG REPORT I, Dionne Bucy, the attending physician, personally viewed and interpreted this ECG.  Date: 10/30/2016 EKG Time: 1616 Rate: 80 Rhythm: normal sinus rhythm QRS Axis: normal Intervals: normal ST/T Wave abnormalities: normal Narrative Interpretation: no evidence of acute ischemia  ____________________________________________  RADIOLOGY   CT head: mild small vessel chronic ischemic changes XR L shoulder: distal clavicle fx, minimally displaced XR L knee: no fracture XR Lumbar spine: no acute fracture   ____________________________________________   PROCEDURES  Procedure(s) performed: No    Critical Care performed: No ____________________________________________   INITIAL IMPRESSION / ASSESSMENT AND PLAN / ED COURSE  Pertinent labs & imaging results that were available during my care of the patient were reviewed by me and considered in my medical decision making (see chart for details).  71 year old female with past medical history as noted presents with a EMS described as mild confusion over the last several days and associated with multiple falls. Vital signs are normal except for hypertension, patient is comfortable appearing, and exam is as described. There are no focal neurologic findings. Patient is currently alert, answering all questions appropriately, and is oriented 3. Exam otherwise as described with left shoulder and knee injuries.  Low suspicion for primary CNS cause given no acute neurologic findings, although must consider ICH so we'll obtain CT head and especially given the recurrent trauma.  Differential also includes infection specially TI,  dehydration or other metabolic cause, electrolyte abnormality, less likely cardiac.  Plan for imaging of L shoulder and knee, basic labs, trop x1, UA, and reassess.  No resp sx to suggest pulmonary source.  If workup negative, reassess as to whether pt will require admission for further workup or is safe for d/c home.   Clinical Course as of Oct 30 2349  Thu Oct 30, 2016  1901 ALT: 15 [SS]  Clinical Course User Index [SS] Dionne Bucy, MD   ----------------------------------------- 7:24 PM on 10/30/2016 -----------------------------------------  Patient's lab workup is unremarkable.  Creatinine is slightly elevated from her most recent admission but no other acute findings. The UA shows no signs of infection. Troponin is negative. CT head shows no acute findings. The left shoulder x-ray shows minimally displaced distal clavicle fracture.  At this time patient continues to be a and o 3 and comfortable appearing. She expresses strong desire to go home if at all possible, and her son who is her caregiver agrees with this plan. Given patient's negative medical workup there is no specific indication to admit her to the hospital. Patient and her son both feel that she is safe at home. Will discharge patient with sling for the clavicle fracture and orthopedic referral; there is no indication for emergent orthopedic consultation or procedure.  Return precautions given.   ____________________________________________   FINAL CLINICAL IMPRESSION(S) / ED DIAGNOSES  Final diagnoses:  Closed nondisplaced fracture of acromial end of left clavicle, initial encounter  Confusion      NEW MEDICATIONS STARTED DURING THIS VISIT:  Discharge Medication List as of 10/30/2016  7:30 PM       Note:  This document was prepared using Dragon voice recognition software and may include unintentional dictation errors.    Dionne Bucy, MD 10/30/16 2351

## 2016-10-30 NOTE — ED Triage Notes (Addendum)
Pt fell X 2 or 3 today.  Pt initially report falling twice today once outside then also inside.  Then changed to 3 times. When clarified pt unsure if fell 2 or 3 times.  Son reports some AMS over last week.  Pt is disoriented to year in triage.  Is oriented to location and person.  Pt recently admitted for vomiting/hypokalemia.  Has not had any more vomiting. Pt did not hit head per report.  Pain to left knee and left shoulder.

## 2016-11-06 DIAGNOSIS — Z9181 History of falling: Secondary | ICD-10-CM | POA: Diagnosis not present

## 2016-11-12 DIAGNOSIS — S42035A Nondisplaced fracture of lateral end of left clavicle, initial encounter for closed fracture: Secondary | ICD-10-CM | POA: Diagnosis not present

## 2016-11-12 DIAGNOSIS — R2689 Other abnormalities of gait and mobility: Secondary | ICD-10-CM | POA: Diagnosis not present

## 2016-11-26 DIAGNOSIS — R2689 Other abnormalities of gait and mobility: Secondary | ICD-10-CM | POA: Diagnosis not present

## 2016-11-26 DIAGNOSIS — M25551 Pain in right hip: Secondary | ICD-10-CM | POA: Diagnosis not present

## 2016-11-26 DIAGNOSIS — S42034D Nondisplaced fracture of lateral end of right clavicle, subsequent encounter for fracture with routine healing: Secondary | ICD-10-CM | POA: Diagnosis not present

## 2016-12-15 DIAGNOSIS — I10 Essential (primary) hypertension: Secondary | ICD-10-CM | POA: Diagnosis not present

## 2016-12-15 DIAGNOSIS — E785 Hyperlipidemia, unspecified: Secondary | ICD-10-CM | POA: Diagnosis not present

## 2016-12-15 DIAGNOSIS — M797 Fibromyalgia: Secondary | ICD-10-CM | POA: Diagnosis not present

## 2016-12-24 ENCOUNTER — Other Ambulatory Visit: Payer: Self-pay | Admitting: Internal Medicine

## 2016-12-24 DIAGNOSIS — Z1231 Encounter for screening mammogram for malignant neoplasm of breast: Secondary | ICD-10-CM

## 2017-01-28 DIAGNOSIS — N390 Urinary tract infection, site not specified: Secondary | ICD-10-CM | POA: Diagnosis not present

## 2017-02-06 DIAGNOSIS — N390 Urinary tract infection, site not specified: Secondary | ICD-10-CM | POA: Diagnosis not present

## 2017-02-20 DIAGNOSIS — J069 Acute upper respiratory infection, unspecified: Secondary | ICD-10-CM | POA: Diagnosis not present

## 2017-02-27 DIAGNOSIS — E785 Hyperlipidemia, unspecified: Secondary | ICD-10-CM | POA: Diagnosis not present

## 2017-02-27 DIAGNOSIS — I10 Essential (primary) hypertension: Secondary | ICD-10-CM | POA: Diagnosis not present

## 2017-03-02 ENCOUNTER — Ambulatory Visit
Admission: RE | Admit: 2017-03-02 | Discharge: 2017-03-02 | Disposition: A | Discharge status: Home / Self Care | Payer: 59 | Source: Ambulatory Visit | Attending: Internal Medicine | Admitting: Internal Medicine

## 2017-03-02 ENCOUNTER — Other Ambulatory Visit: Payer: Self-pay | Admitting: Internal Medicine

## 2017-03-02 DIAGNOSIS — R05 Cough: Secondary | ICD-10-CM | POA: Insufficient documentation

## 2017-03-02 DIAGNOSIS — R059 Cough, unspecified: Secondary | ICD-10-CM

## 2017-03-02 DIAGNOSIS — J9811 Atelectasis: Secondary | ICD-10-CM | POA: Diagnosis not present

## 2017-03-02 DIAGNOSIS — J986 Disorders of diaphragm: Secondary | ICD-10-CM | POA: Diagnosis not present

## 2017-03-06 DIAGNOSIS — I10 Essential (primary) hypertension: Secondary | ICD-10-CM | POA: Diagnosis not present

## 2017-03-06 DIAGNOSIS — D649 Anemia, unspecified: Secondary | ICD-10-CM | POA: Diagnosis not present

## 2017-03-06 DIAGNOSIS — E785 Hyperlipidemia, unspecified: Secondary | ICD-10-CM | POA: Diagnosis not present

## 2017-05-20 DIAGNOSIS — R05 Cough: Secondary | ICD-10-CM | POA: Diagnosis not present

## 2017-05-20 DIAGNOSIS — J069 Acute upper respiratory infection, unspecified: Secondary | ICD-10-CM | POA: Diagnosis not present

## 2017-07-03 DIAGNOSIS — R609 Edema, unspecified: Secondary | ICD-10-CM | POA: Diagnosis not present

## 2017-07-17 DIAGNOSIS — I10 Essential (primary) hypertension: Secondary | ICD-10-CM | POA: Diagnosis not present

## 2017-07-17 DIAGNOSIS — E785 Hyperlipidemia, unspecified: Secondary | ICD-10-CM | POA: Diagnosis not present

## 2017-07-24 DIAGNOSIS — E785 Hyperlipidemia, unspecified: Secondary | ICD-10-CM | POA: Diagnosis not present

## 2017-07-24 DIAGNOSIS — D649 Anemia, unspecified: Secondary | ICD-10-CM | POA: Diagnosis not present

## 2017-07-24 DIAGNOSIS — I10 Essential (primary) hypertension: Secondary | ICD-10-CM | POA: Diagnosis not present

## 2017-07-24 DIAGNOSIS — M5134 Other intervertebral disc degeneration, thoracic region: Secondary | ICD-10-CM | POA: Diagnosis not present

## 2017-08-05 ENCOUNTER — Emergency Department: Payer: 59

## 2017-08-05 ENCOUNTER — Other Ambulatory Visit: Payer: Self-pay

## 2017-08-05 ENCOUNTER — Inpatient Hospital Stay
Admission: EM | Admit: 2017-08-05 | Discharge: 2017-08-10 | DRG: 481 | Disposition: A | Discharge status: Skilled Nursing Facility | Payer: 59 | Attending: Internal Medicine | Admitting: Internal Medicine

## 2017-08-05 DIAGNOSIS — S72002A Fracture of unspecified part of neck of left femur, initial encounter for closed fracture: Secondary | ICD-10-CM | POA: Diagnosis not present

## 2017-08-05 DIAGNOSIS — R112 Nausea with vomiting, unspecified: Secondary | ICD-10-CM | POA: Diagnosis not present

## 2017-08-05 DIAGNOSIS — F05 Delirium due to known physiological condition: Secondary | ICD-10-CM | POA: Diagnosis not present

## 2017-08-05 DIAGNOSIS — I10 Essential (primary) hypertension: Secondary | ICD-10-CM | POA: Diagnosis present

## 2017-08-05 DIAGNOSIS — D62 Acute posthemorrhagic anemia: Secondary | ICD-10-CM | POA: Diagnosis not present

## 2017-08-05 DIAGNOSIS — G9349 Other encephalopathy: Secondary | ICD-10-CM | POA: Diagnosis not present

## 2017-08-05 DIAGNOSIS — K219 Gastro-esophageal reflux disease without esophagitis: Secondary | ICD-10-CM | POA: Diagnosis not present

## 2017-08-05 DIAGNOSIS — Y92512 Supermarket, store or market as the place of occurrence of the external cause: Secondary | ICD-10-CM | POA: Diagnosis not present

## 2017-08-05 DIAGNOSIS — S72115A Nondisplaced fracture of greater trochanter of left femur, initial encounter for closed fracture: Principal | ICD-10-CM | POA: Diagnosis present

## 2017-08-05 DIAGNOSIS — R4182 Altered mental status, unspecified: Secondary | ICD-10-CM | POA: Diagnosis not present

## 2017-08-05 DIAGNOSIS — G8929 Other chronic pain: Secondary | ICD-10-CM | POA: Diagnosis not present

## 2017-08-05 DIAGNOSIS — S72145A Nondisplaced intertrochanteric fracture of left femur, initial encounter for closed fracture: Secondary | ICD-10-CM | POA: Diagnosis not present

## 2017-08-05 DIAGNOSIS — Z7951 Long term (current) use of inhaled steroids: Secondary | ICD-10-CM

## 2017-08-05 DIAGNOSIS — M479 Spondylosis, unspecified: Secondary | ICD-10-CM | POA: Diagnosis not present

## 2017-08-05 DIAGNOSIS — S299XXA Unspecified injury of thorax, initial encounter: Secondary | ICD-10-CM | POA: Diagnosis not present

## 2017-08-05 DIAGNOSIS — L511 Stevens-Johnson syndrome: Secondary | ICD-10-CM | POA: Diagnosis present

## 2017-08-05 DIAGNOSIS — F419 Anxiety disorder, unspecified: Secondary | ICD-10-CM | POA: Diagnosis not present

## 2017-08-05 DIAGNOSIS — Z79899 Other long term (current) drug therapy: Secondary | ICD-10-CM

## 2017-08-05 DIAGNOSIS — N179 Acute kidney failure, unspecified: Secondary | ICD-10-CM | POA: Diagnosis present

## 2017-08-05 DIAGNOSIS — M810 Age-related osteoporosis without current pathological fracture: Secondary | ICD-10-CM | POA: Diagnosis present

## 2017-08-05 DIAGNOSIS — Z419 Encounter for procedure for purposes other than remedying health state, unspecified: Secondary | ICD-10-CM

## 2017-08-05 DIAGNOSIS — S72009A Fracture of unspecified part of neck of unspecified femur, initial encounter for closed fracture: Secondary | ICD-10-CM | POA: Diagnosis present

## 2017-08-05 DIAGNOSIS — M6281 Muscle weakness (generalized): Secondary | ICD-10-CM | POA: Diagnosis not present

## 2017-08-05 DIAGNOSIS — Z7982 Long term (current) use of aspirin: Secondary | ICD-10-CM | POA: Diagnosis not present

## 2017-08-05 DIAGNOSIS — D649 Anemia, unspecified: Secondary | ICD-10-CM | POA: Diagnosis not present

## 2017-08-05 DIAGNOSIS — S72142A Displaced intertrochanteric fracture of left femur, initial encounter for closed fracture: Secondary | ICD-10-CM | POA: Diagnosis not present

## 2017-08-05 DIAGNOSIS — S72145D Nondisplaced intertrochanteric fracture of left femur, subsequent encounter for closed fracture with routine healing: Secondary | ICD-10-CM | POA: Diagnosis not present

## 2017-08-05 DIAGNOSIS — Z9181 History of falling: Secondary | ICD-10-CM | POA: Diagnosis not present

## 2017-08-05 DIAGNOSIS — E876 Hypokalemia: Secondary | ICD-10-CM | POA: Diagnosis not present

## 2017-08-05 DIAGNOSIS — Z7401 Bed confinement status: Secondary | ICD-10-CM | POA: Diagnosis not present

## 2017-08-05 DIAGNOSIS — D61818 Other pancytopenia: Secondary | ICD-10-CM

## 2017-08-05 DIAGNOSIS — W010XXA Fall on same level from slipping, tripping and stumbling without subsequent striking against object, initial encounter: Secondary | ICD-10-CM | POA: Diagnosis present

## 2017-08-05 DIAGNOSIS — R262 Difficulty in walking, not elsewhere classified: Secondary | ICD-10-CM | POA: Diagnosis not present

## 2017-08-05 DIAGNOSIS — W19XXXA Unspecified fall, initial encounter: Secondary | ICD-10-CM | POA: Diagnosis not present

## 2017-08-05 DIAGNOSIS — R110 Nausea: Secondary | ICD-10-CM | POA: Diagnosis not present

## 2017-08-05 DIAGNOSIS — E559 Vitamin D deficiency, unspecified: Secondary | ICD-10-CM | POA: Diagnosis not present

## 2017-08-05 DIAGNOSIS — M84459A Pathological fracture, hip, unspecified, initial encounter for fracture: Secondary | ICD-10-CM | POA: Diagnosis not present

## 2017-08-05 DIAGNOSIS — M1990 Unspecified osteoarthritis, unspecified site: Secondary | ICD-10-CM | POA: Diagnosis not present

## 2017-08-05 LAB — CBC WITH DIFFERENTIAL/PLATELET
BASOS ABS: 0 10*3/uL (ref 0–0.1)
Basophils Relative: 1 %
EOS ABS: 0.1 10*3/uL (ref 0–0.7)
Eosinophils Relative: 3 %
HCT: 30.9 % — ABNORMAL LOW (ref 35.0–47.0)
HEMOGLOBIN: 10.6 g/dL — AB (ref 12.0–16.0)
LYMPHS ABS: 1.2 10*3/uL (ref 1.0–3.6)
Lymphocytes Relative: 24 %
MCH: 31.3 pg (ref 26.0–34.0)
MCHC: 34.3 g/dL (ref 32.0–36.0)
MCV: 91.1 fL (ref 80.0–100.0)
Monocytes Absolute: 0.5 10*3/uL (ref 0.2–0.9)
Monocytes Relative: 9 %
NEUTROS PCT: 63 %
Neutro Abs: 3.2 10*3/uL (ref 1.4–6.5)
PLATELETS: 109 10*3/uL — AB (ref 150–440)
RBC: 3.39 MIL/uL — AB (ref 3.80–5.20)
RDW: 14.5 % (ref 11.5–14.5)
WBC: 5.1 10*3/uL (ref 3.6–11.0)

## 2017-08-05 LAB — COMPREHENSIVE METABOLIC PANEL
ALT: 15 U/L (ref 14–54)
ANION GAP: 10 (ref 5–15)
AST: 28 U/L (ref 15–41)
Albumin: 4.5 g/dL (ref 3.5–5.0)
Alkaline Phosphatase: 92 U/L (ref 38–126)
BUN: 41 mg/dL — ABNORMAL HIGH (ref 6–20)
CALCIUM: 8.7 mg/dL — AB (ref 8.9–10.3)
CHLORIDE: 105 mmol/L (ref 101–111)
CO2: 21 mmol/L — AB (ref 22–32)
Creatinine, Ser: 1.33 mg/dL — ABNORMAL HIGH (ref 0.44–1.00)
GFR, EST AFRICAN AMERICAN: 45 mL/min — AB (ref >60)
GFR, EST NON AFRICAN AMERICAN: 39 mL/min — AB (ref >60)
Glucose, Bld: 104 mg/dL — ABNORMAL HIGH (ref 65–99)
Potassium: 4.1 mmol/L (ref 3.5–5.1)
SODIUM: 136 mmol/L (ref 135–145)
Total Bilirubin: 0.6 mg/dL (ref 0.3–1.2)
Total Protein: 7.7 g/dL (ref 6.5–8.1)

## 2017-08-05 LAB — TROPONIN I

## 2017-08-05 MED ORDER — FERROUS SULFATE 325 (65 FE) MG PO TABS
325.0000 mg | ORAL_TABLET | Freq: Every day | ORAL | Status: DC
  Administered 2017-08-06 – 2017-08-10 (×4): 325 mg via ORAL
  Filled 2017-08-05 (×3): qty 1

## 2017-08-05 MED ORDER — CEFAZOLIN SODIUM-DEXTROSE 2-4 GM/100ML-% IV SOLN
2.0000 g | INTRAVENOUS | Status: AC
  Administered 2017-08-06: 2 g via INTRAVENOUS
  Filled 2017-08-05: qty 100

## 2017-08-05 MED ORDER — ADULT MULTIVITAMIN W/MINERALS CH
1.0000 | ORAL_TABLET | Freq: Every day | ORAL | Status: DC
  Administered 2017-08-08 – 2017-08-10 (×3): 1 via ORAL
  Filled 2017-08-05 (×4): qty 1

## 2017-08-05 MED ORDER — ACETAMINOPHEN 650 MG RE SUPP: 650.0000 mg | Freq: Four times a day (QID) | RECTAL | Status: DC | PRN

## 2017-08-05 MED ORDER — ONDANSETRON HCL 4 MG PO TABS
4.0000 mg | ORAL_TABLET | Freq: Four times a day (QID) | ORAL | Status: DC | PRN
  Administered 2017-08-05: 4 mg via ORAL
  Filled 2017-08-05: qty 1

## 2017-08-05 MED ORDER — AMLODIPINE BESYLATE 10 MG PO TABS
10.0000 mg | ORAL_TABLET | Freq: Every day | ORAL | Status: DC
  Administered 2017-08-06 – 2017-08-10 (×4): 10 mg via ORAL
  Filled 2017-08-05 (×4): qty 1

## 2017-08-05 MED ORDER — SODIUM CHLORIDE 0.9 % IV SOLN
INTRAVENOUS | Status: DC
  Administered 2017-08-05: 23:00:00 via INTRAVENOUS

## 2017-08-05 MED ORDER — BUTALBITAL-APAP-CAFFEINE 50-325-40 MG PO TABS
1.0000 | ORAL_TABLET | Freq: Four times a day (QID) | ORAL | Status: DC | PRN
  Filled 2017-08-05: qty 1

## 2017-08-05 MED ORDER — TRAZODONE HCL 50 MG PO TABS: 25.0000 mg | ORAL_TABLET | Freq: Every evening | ORAL | Status: DC | PRN

## 2017-08-05 MED ORDER — IPRATROPIUM-ALBUTEROL 0.5-2.5 (3) MG/3ML IN SOLN: 3.0000 mL | Freq: Four times a day (QID) | RESPIRATORY_TRACT | Status: DC | PRN

## 2017-08-05 MED ORDER — BUPIVACAINE HCL (PF) 0.5 % IJ SOLN
INTRAMUSCULAR | Status: AC
  Filled 2017-08-05: qty 30

## 2017-08-05 MED ORDER — BUPIVACAINE HCL 0.25 % IJ SOLN
30.0000 mL | Freq: Once | INTRAMUSCULAR | Status: AC
  Administered 2017-08-05: 30 mL
  Filled 2017-08-05 (×2): qty 30

## 2017-08-05 MED ORDER — DOCUSATE SODIUM 100 MG PO CAPS
100.0000 mg | ORAL_CAPSULE | Freq: Two times a day (BID) | ORAL | Status: DC
  Administered 2017-08-05: 100 mg via ORAL
  Filled 2017-08-05 (×2): qty 1

## 2017-08-05 MED ORDER — OXYCODONE HCL 5 MG PO TABS
10.0000 mg | ORAL_TABLET | Freq: Three times a day (TID) | ORAL | Status: DC
  Administered 2017-08-05 – 2017-08-10 (×11): 10 mg via ORAL
  Filled 2017-08-05 (×13): qty 2

## 2017-08-05 MED ORDER — PANTOPRAZOLE SODIUM 40 MG PO TBEC
40.0000 mg | DELAYED_RELEASE_TABLET | Freq: Every day | ORAL | Status: DC
  Administered 2017-08-08 – 2017-08-10 (×3): 40 mg via ORAL
  Filled 2017-08-05 (×4): qty 1

## 2017-08-05 MED ORDER — LORATADINE 10 MG PO TABS
10.0000 mg | ORAL_TABLET | Freq: Every day | ORAL | Status: DC
  Administered 2017-08-08 – 2017-08-10 (×3): 10 mg via ORAL
  Filled 2017-08-05 (×4): qty 1

## 2017-08-05 MED ORDER — FENOFIBRIC ACID 135 MG PO CPDR: 135.0000 mg | DELAYED_RELEASE_CAPSULE | Freq: Every day | ORAL | Status: DC

## 2017-08-05 MED ORDER — IPRATROPIUM-ALBUTEROL 18-103 MCG/ACT IN AERO: 2.0000 | INHALATION_SPRAY | Freq: Four times a day (QID) | RESPIRATORY_TRACT | Status: DC | PRN

## 2017-08-05 MED ORDER — FENTANYL CITRATE (PF) 100 MCG/2ML IJ SOLN
100.0000 ug | Freq: Once | INTRAMUSCULAR | Status: AC
  Administered 2017-08-05: 100 ug via INTRAVENOUS
  Filled 2017-08-05: qty 2

## 2017-08-05 MED ORDER — MORPHINE SULFATE ER 30 MG PO TBCR
60.0000 mg | EXTENDED_RELEASE_TABLET | Freq: Three times a day (TID) | ORAL | Status: DC
  Administered 2017-08-05 – 2017-08-10 (×10): 60 mg via ORAL
  Filled 2017-08-05: qty 4
  Filled 2017-08-05 (×10): qty 2

## 2017-08-05 MED ORDER — FENOFIBRATE 160 MG PO TABS
160.0000 mg | ORAL_TABLET | Freq: Every day | ORAL | Status: DC
  Administered 2017-08-08 – 2017-08-10 (×3): 160 mg via ORAL
  Filled 2017-08-05 (×5): qty 1

## 2017-08-05 MED ORDER — BISACODYL 5 MG PO TBEC: 5.0000 mg | DELAYED_RELEASE_TABLET | Freq: Every day | ORAL | Status: DC | PRN

## 2017-08-05 MED ORDER — LISINOPRIL 20 MG PO TABS
20.0000 mg | ORAL_TABLET | Freq: Every day | ORAL | Status: DC
  Administered 2017-08-06 – 2017-08-10 (×4): 20 mg via ORAL
  Filled 2017-08-05 (×4): qty 1

## 2017-08-05 MED ORDER — ACETAMINOPHEN 325 MG PO TABS: 650.0000 mg | ORAL_TABLET | Freq: Four times a day (QID) | ORAL | Status: DC | PRN

## 2017-08-05 MED ORDER — ONDANSETRON HCL 4 MG/2ML IJ SOLN: 4.0000 mg | Freq: Four times a day (QID) | INTRAMUSCULAR | Status: DC | PRN

## 2017-08-05 MED ORDER — HALOPERIDOL LACTATE 5 MG/ML IJ SOLN
2.5000 mg | Freq: Once | INTRAMUSCULAR | Status: AC
  Administered 2017-08-05: 2.5 mg via INTRAVENOUS
  Filled 2017-08-05: qty 1

## 2017-08-05 NOTE — ED Triage Notes (Signed)
Patient fell in store and has hx of osteoporosis and thinks she fracture her left hip. Was given 4 zofran and of fentanyl by EMS. Vitals stable for EMS.

## 2017-08-05 NOTE — ED Provider Notes (Signed)
[facility] Emergency Department Provider Note  ____________________________________________   First MD Initiated Contact with Patient 08/05/17 1954     (approximate)  I have reviewed the triage vital signs and the nursing notes.   HISTORY  Chief Complaint Hip Pain and Fall   HPI Alison Price is a 72 y.o. female comes to the emergency department via EMS after a mechanical trip and fall this evening at the [employer].  She landed on her left side sustaining sudden onset severe left hip pain.  She has been unable to get up or bear weight ever since.  EMS gave her 100 mcg of fentanyl and was able to transition her to their gurney.  Her pain remains severe.  It is nonradiating.  Is worse with movement.  It is improved with rest.  Her leg was shortened and rotated.  She takes aspirin but no other blood thinning medications.  She did not hit her head.  She vomited once after fentanyl.  No double vision or blurred vision.  No palpitations.  No chest pain.  She does have a past medical history of chronic pain for which she takes MS Contin and Percocet.  Past Medical History:  Diagnosis Date  . Anemia   . Arthritis   . Hypertension   . Osteoporosis   . Stevens-Johnson syndrome [facility])     Patient Active Problem List   Diagnosis Date Noted  . Hip fx (HCC) 08/05/2017  . Intractable nausea and vomiting 10/23/2016  . Hypokalemia 10/23/2016  . Dehydration 10/23/2016  . HTN (hypertension) 10/23/2016  . History of total abdominal hysterectomy 06/26/2015  . Pelvic pressure in female 06/26/2015  . Vaginal atrophy 06/26/2015  . Hematuria 06/26/2015  . Dysuria 06/26/2015  . Pancytopenia (HCC) 04/13/2015    Past Surgical History:  Procedure Laterality Date  . ABDOMINAL HYSTERECTOMY     partial  . BACK SURGERY    . FOOT SURGERY    . HAND SURGERY     THUMB REPAIR  . HEMORRHOID SURGERY    . POSTERIOR REPAIR      Prior to Admission medications     Medication Sig Start Date End Date Taking? Authorizing Provider  albuterol-ipratropium (COMBIVENT) 18-103 MCG/ACT inhaler Inhale 2 puffs into the lungs every 6 (six) hours as needed for wheezing or shortness of breath.   Yes [provider]  amLODipine (NORVASC) 10 MG tablet Take 10 mg by mouth daily.  04/09/15  Yes [provider]  aspirin 81 MG tablet Take 81 mg by mouth daily.   Yes [provider]  butalbital-acetaminophen-caffeine (FIORICET, ESGIC) 50-325-40 MG tablet Take 1 tablet by mouth every 6 (six) hours as needed for headache or migraine.   Yes [provider]  Choline Fenofibrate (FENOFIBRIC ACID) 135 MG CPDR Take 135 mg by mouth daily.  04/09/15  Yes [provider]  etodolac (LODINE) 400 MG tablet Take 400 mg by mouth 2 (two) times daily.   Yes [provider]  Ferrous Sulfate (SLOW FE) 142 (45 Fe) MG TBCR Take 1 tablet by mouth daily.   Yes [provider]  fexofenadine (ALLEGRA) 180 MG tablet Take 180 mg by mouth daily.   Yes [provider]  Fluticasone-Salmeterol (ADVAIR) 250-50 MCG/DOSE AEPB Inhale 1 puff into the lungs 2 (two) times daily.   Yes [provider]  lisinopril (PRINIVIL,ZESTRIL) 20 MG tablet Take 20 mg by mouth daily.   Yes [provider]  meclizine (ANTIVERT) 25 MG tablet Take  25 mg by mouth 2 (two) times daily as needed for dizziness.   Yes [provider]  morphine (MS CONTIN) 60 MG 12 hr tablet Take 60 mg by mouth 3 (three) times daily.  03/15/15  Yes [provider]  Multiple Vitamin (MULTIVITAMIN) tablet Take 1 tablet by mouth daily.   Yes [provider]  Oxycodone HCl 10 MG TABS Take 10 mg by mouth 3 (three) times daily.   Yes [provider]  pantoprazole (PROTONIX) 40 MG tablet Take 40 mg by mouth daily.    Yes [provider]  tiZANidine (ZANAFLEX) 4 MG tablet Take 4 mg by mouth every 6 (six) hours as needed.  01/17/15  Yes  [provider]  conjugated estrogens (PREMARIN) vaginal cream 1 gram intravaginal BIW Patient not taking: Reported on 10/23/2016 06/26/15   Defrancesco, Prentice Docker, MD  ondansetron (ZOFRAN) 4 MG tablet Take 1 tablet (4 mg total) by mouth every 8 (eight) hours as needed for nausea or vomiting. Patient not taking: Reported on 08/05/2017 10/25/16   Shaune Pollack, MD  oxyCODONE-acetaminophen (ROXICET) 5-325 MG tablet Take 1 tablet by mouth every 6 (six) hours as needed. Patient not taking: Reported on 08/05/2017 02/20/16   Minna Antis, MD    Allergies Codeine and Sulfa antibiotics  Family History  Problem Relation Age of Onset  . Diabetes Paternal Grandmother   . Uterine cancer Maternal Aunt   . Stomach cancer Maternal Aunt   . Breast cancer Neg Hx   . Ovarian cancer Neg Hx   . Colon cancer Neg Hx   . Heart disease Neg Hx     Social History Social History   Tobacco Use  . Smoking status: Never Smoker  . Smokeless tobacco: Never Used  Substance Use Topics  . Alcohol use: No  . Drug use: No    Review of Systems Constitutional: No fever/chills Eyes: No visual changes. ENT: No sore throat. Cardiovascular: Denies chest pain. Respiratory: Denies shortness of breath. Gastrointestinal: No abdominal pain.  No nausea, no vomiting.  No diarrhea.  No constipation. Genitourinary: Negative for dysuria. Musculoskeletal: Positive for hip pain Skin: Negative for rash. Neurological: Negative for headaches, focal weakness or numbness.   ____________________________________________   PHYSICAL EXAM:  VITAL SIGNS: ED Triage Vitals  Enc Vitals Group     BP      Pulse      Resp      Temp      Temp src      SpO2      Weight      Height      Head Circumference      Peak Flow      Pain Score      Pain Loc      Pain Edu?      Excl. in GC?     Constitutional: Alert and oriented x4 screaming in pain and appears extremely uncomfortable Eyes: PERRL EOMI. Head:  Atraumatic. Nose: No congestion/rhinnorhea. Mouth/Throat: No trismus Neck: No stridor.   Cardiovascular: Normal rate, regular rhythm. Grossly normal heart sounds.  Good peripheral circulation. Respiratory: Normal respiratory effort.  No retractions. Lungs CTAB and moving good air Gastrointestinal: Soft nontender Musculoskeletal: Left leg is shortened and externally rotated.  Exquisite discomfort when ranging.  Neurovascularly intact Neurologic:  Normal speech and language. No gross focal neurologic deficits are appreciated. Skin:  Skin is warm, dry and intact. No rash noted. Psychiatric: Mood and affect are normal. Speech and behavior are normal.  ____________________________________________   DIFFERENTIAL includes but not limited to  Hip fracture, pelvic fracture, hip dislocation, contusion ____________________________________________   LABS (all labs ordered are listed, but only abnormal results are displayed)  Labs Reviewed  COMPREHENSIVE METABOLIC PANEL - Abnormal; Notable for the following components:      Result Value   CO2 21 (*)    Glucose, Bld 104 (*)    BUN 41 (*)    Creatinine, Ser 1.33 (*)    Calcium 8.7 (*)    GFR calc non Af Amer 39 (*)    GFR calc Af Amer 45 (*)    All other components within normal limits  CBC WITH DIFFERENTIAL/PLATELET - Abnormal; Notable for the following components:   RBC 3.39 (*)    Hemoglobin 10.6 (*)    HCT 30.9 (*)    Platelets 109 (*)    All other components within normal limits  MRSA PCR SCREENING  TROPONIN I  BASIC METABOLIC PANEL  CBC  TYPE AND SCREEN    Lab work reviewed by me with no signs of acute ischemia __________________________________________  EKG  ED ECG REPORT I, Merrily Brittle, the attending physician, personally viewed and interpreted this ECG.  Date: 08/05/2017 EKG Time:  Rate: 100 Rhythm: normal sinus rhythm QRS Axis: normal Intervals: normal ST/T Wave abnormalities: normal Narrative  Interpretation: no evidence of acute ischemia  ____________________________________________  RADIOLOGY  X-ray of the left hip reviewed by me consistent with proximal fracture Chest x-ray reviewed by me with no infiltrate ____________________________________________   PROCEDURES  Procedure(s) performed: Yes  .Nerve Block Date/Time: 08/05/2017 11:38 PM Performed by: Merrily Brittle, MD Authorized by: Merrily Brittle, MD   Consent:    Consent obtained:  Verbal   Consent given by:  Patient   Risks discussed:  Bleeding, intravenous injection, pain, nerve damage, unsuccessful block and infection Indications:    Indications:  Pain relief Location:    Body area:  Lower extremity   Lower extremity nerve blocked: fascia iliaca.   Laterality:  Left Pre-procedure details:    Skin preparation:  2% chlorhexidine Skin anesthesia (see MAR for exact dosages):    Skin anesthesia method:  Local infiltration   Local anesthetic:  Bupivacaine 0.25% w/o epi Procedure details (see MAR for exact dosages):    Block needle gauge:  21 G   Guidance: ultrasound     Anesthetic injected:  Bupivacaine 0.25% w/o epi   Steroid injected:  None   Additive injected:  None   Injection procedure:  Anatomic landmarks identified, anatomic landmarks palpated, incremental injection and negative aspiration for blood   Paresthesia:  None Post-procedure details:    Dressing:  None   Outcome:  Pain improved Comments:     Mixed a total of 30 cc of 0.5% bupivacaine without epinephrine with 20 cc of normal saline and using sterile technique and direct ultrasound visualization was able to locate the fascia iliac a plane and infuse 40 cc of this mixture.  Roughly 1 hour thereafter the patient's pain was significantly improved.    Critical Care performed: no  Observation: no ____________________________________________   INITIAL IMPRESSION / ASSESSMENT AND PLAN / ED COURSE  Pertinent labs & imaging results that  were available during my care of the patient were reviewed by me and considered in my medical decision making (see chart for details).  The patient arrives extremely uncomfortable appearing despite already receiving 100 mcg of fentanyl.  Given another 100 mcg now along with 2.5 mg of haloperidol for nausea.  I do suspect a hip fracture.     X-ray confirms a left hip fracture extending through the greater tuberosity.  I consented the patient for a fascia iliac a block which was performed and achieved significantly improved anesthesia.  I discussed the case with on-call orthopedic surgeon Dr. Ernest PineHooten who will kindly consult on the case.  I then discussed with the hospitalist who has graciously agreed to admit the patient to her service. ____________________________________________   FINAL CLINICAL IMPRESSION(S) / ED DIAGNOSES  Final diagnoses:  Closed nondisplaced fracture of greater trochanter of left femur, initial encounter (HCC)      NEW MEDICATIONS STARTED DURING THIS VISIT:  Current Discharge Medication List       Note:  This document was prepared using Dragon voice recognition software and may include unintentional dictation errors.     Merrily Brittleifenbark, Meila Berke, MD 08/05/17 2342

## 2017-08-06 ENCOUNTER — Inpatient Hospital Stay: Payer: 59

## 2017-08-06 ENCOUNTER — Inpatient Hospital Stay: Payer: 59 | Admitting: Anesthesiology

## 2017-08-06 ENCOUNTER — Encounter
Admission: EM | Disposition: A | Discharge status: Skilled Nursing Facility | Payer: Self-pay | Source: Home / Self Care | Attending: Internal Medicine

## 2017-08-06 ENCOUNTER — Encounter: Payer: Self-pay | Admitting: Certified Registered Nurse Anesthetist

## 2017-08-06 HISTORY — PX: INTRAMEDULLARY (IM) NAIL INTERTROCHANTERIC: SHX5875

## 2017-08-06 LAB — BASIC METABOLIC PANEL
Anion gap: 8 (ref 5–15)
BUN: 34 mg/dL — AB (ref 6–20)
CO2: 21 mmol/L — ABNORMAL LOW (ref 22–32)
CREATININE: 0.95 mg/dL (ref 0.44–1.00)
Calcium: 8.7 mg/dL — ABNORMAL LOW (ref 8.9–10.3)
Chloride: 106 mmol/L (ref 101–111)
GFR calc Af Amer: >60 mL/min (ref >60)
GFR, EST NON AFRICAN AMERICAN: 59 mL/min — AB (ref >60)
Glucose, Bld: 115 mg/dL — ABNORMAL HIGH (ref 65–99)
Potassium: 4.5 mmol/L (ref 3.5–5.1)
SODIUM: 135 mmol/L (ref 135–145)

## 2017-08-06 LAB — CBC
HCT: 29.9 % — ABNORMAL LOW (ref 35.0–47.0)
Hemoglobin: 10.1 g/dL — ABNORMAL LOW (ref 12.0–16.0)
MCH: 30.8 pg (ref 26.0–34.0)
MCHC: 33.7 g/dL (ref 32.0–36.0)
MCV: 91.3 fL (ref 80.0–100.0)
PLATELETS: 98 10*3/uL — AB (ref 150–440)
RBC: 3.27 MIL/uL — ABNORMAL LOW (ref 3.80–5.20)
RDW: 14.5 % (ref 11.5–14.5)
WBC: 4.7 10*3/uL (ref 3.6–11.0)

## 2017-08-06 LAB — HEMOGLOBIN: Hemoglobin: 8.7 g/dL — ABNORMAL LOW (ref 12.0–16.0)

## 2017-08-06 LAB — MRSA PCR SCREENING: MRSA by PCR: NEGATIVE

## 2017-08-06 LAB — PROTIME-INR
INR: 1.1
Prothrombin Time: 14.1 seconds (ref 11.4–15.2)

## 2017-08-06 SURGERY — FIXATION, FRACTURE, INTERTROCHANTERIC, WITH INTRAMEDULLARY ROD
Anesthesia: General | Site: Hip | Laterality: Left | Wound class: "Clean "

## 2017-08-06 MED ORDER — BISACODYL 10 MG RE SUPP: 10.0000 mg | Freq: Every day | RECTAL | Status: DC | PRN

## 2017-08-06 MED ORDER — ONDANSETRON HCL 4 MG/2ML IJ SOLN
INTRAMUSCULAR | Status: AC
  Filled 2017-08-06: qty 2

## 2017-08-06 MED ORDER — METOCLOPRAMIDE HCL 10 MG PO TABS
5.0000 mg | ORAL_TABLET | Freq: Three times a day (TID) | ORAL | Status: DC | PRN
  Filled 2017-08-06: qty 1

## 2017-08-06 MED ORDER — SODIUM CHLORIDE 0.9 % IV SOLN
INTRAVENOUS | Status: DC | PRN
  Administered 2017-08-06: 50 ug/min via INTRAVENOUS

## 2017-08-06 MED ORDER — FENTANYL CITRATE (PF) 100 MCG/2ML IJ SOLN
INTRAMUSCULAR | Status: DC | PRN
  Administered 2017-08-06 (×2): 50 ug via INTRAVENOUS

## 2017-08-06 MED ORDER — FENTANYL CITRATE (PF) 100 MCG/2ML IJ SOLN
INTRAMUSCULAR | Status: AC
  Filled 2017-08-06: qty 2

## 2017-08-06 MED ORDER — CEFAZOLIN SODIUM-DEXTROSE 2-4 GM/100ML-% IV SOLN
INTRAVENOUS | Status: AC
  Filled 2017-08-06: qty 100

## 2017-08-06 MED ORDER — MIDAZOLAM HCL 2 MG/2ML IJ SOLN
INTRAMUSCULAR | Status: DC | PRN
  Administered 2017-08-06: 1 mg via INTRAVENOUS

## 2017-08-06 MED ORDER — NEOMYCIN-POLYMYXIN B GU 40-200000 IR SOLN
Status: DC | PRN
  Administered 2017-08-06: 400 mL

## 2017-08-06 MED ORDER — ROCURONIUM BROMIDE 100 MG/10ML IV SOLN
INTRAVENOUS | Status: DC | PRN
  Administered 2017-08-06: 35 mg via INTRAVENOUS
  Administered 2017-08-06: 5 mg via INTRAVENOUS
  Administered 2017-08-06 (×2): 10 mg via INTRAVENOUS

## 2017-08-06 MED ORDER — BUPIVACAINE LIPOSOME 1.3 % IJ SUSP
INTRAMUSCULAR | Status: DC | PRN
  Administered 2017-08-06: 50 mL

## 2017-08-06 MED ORDER — BUPIVACAINE HCL (PF) 0.5 % IJ SOLN
INTRAMUSCULAR | Status: AC
  Filled 2017-08-06: qty 30

## 2017-08-06 MED ORDER — OXYCODONE HCL 5 MG PO TABS: 5.0000 mg | ORAL_TABLET | Freq: Once | ORAL | Status: DC | PRN

## 2017-08-06 MED ORDER — SUGAMMADEX SODIUM 200 MG/2ML IV SOLN
INTRAVENOUS | Status: DC | PRN
  Administered 2017-08-06: 150 mg via INTRAVENOUS

## 2017-08-06 MED ORDER — METHOCARBAMOL 500 MG PO TABS
500.0000 mg | ORAL_TABLET | Freq: Four times a day (QID) | ORAL | Status: DC | PRN
  Filled 2017-08-06: qty 1

## 2017-08-06 MED ORDER — DEXAMETHASONE SODIUM PHOSPHATE 10 MG/ML IJ SOLN
INTRAMUSCULAR | Status: AC
  Filled 2017-08-06: qty 1

## 2017-08-06 MED ORDER — OXYCODONE HCL 5 MG/5ML PO SOLN: 5.0000 mg | Freq: Once | ORAL | Status: DC | PRN

## 2017-08-06 MED ORDER — FENTANYL CITRATE (PF) 100 MCG/2ML IJ SOLN
25.0000 ug | INTRAMUSCULAR | Status: DC | PRN
  Administered 2017-08-06 (×4): 25 ug via INTRAVENOUS

## 2017-08-06 MED ORDER — PHENYLEPHRINE HCL 10 MG/ML IJ SOLN
INTRAMUSCULAR | Status: DC | PRN
  Administered 2017-08-06: 200 ug via INTRAVENOUS
  Administered 2017-08-06 (×3): 100 ug via INTRAVENOUS
  Administered 2017-08-06: 200 ug via INTRAVENOUS
  Administered 2017-08-06: 100 ug via INTRAVENOUS
  Administered 2017-08-06: 200 ug via INTRAVENOUS

## 2017-08-06 MED ORDER — METOCLOPRAMIDE HCL 5 MG/ML IJ SOLN
5.0000 mg | Freq: Three times a day (TID) | INTRAMUSCULAR | Status: DC | PRN
  Administered 2017-08-07 – 2017-08-08 (×3): 10 mg via INTRAVENOUS
  Filled 2017-08-06 (×3): qty 2

## 2017-08-06 MED ORDER — ROCURONIUM BROMIDE 50 MG/5ML IV SOLN
INTRAVENOUS | Status: AC
  Filled 2017-08-06: qty 1

## 2017-08-06 MED ORDER — BUPIVACAINE LIPOSOME 1.3 % IJ SUSP
INTRAMUSCULAR | Status: AC
  Filled 2017-08-06: qty 20

## 2017-08-06 MED ORDER — PROMETHAZINE HCL 25 MG/ML IJ SOLN
12.5000 mg | Freq: Four times a day (QID) | INTRAMUSCULAR | Status: DC | PRN
  Administered 2017-08-06 – 2017-08-08 (×6): 12.5 mg via INTRAVENOUS
  Filled 2017-08-06 (×6): qty 1

## 2017-08-06 MED ORDER — PROPOFOL 10 MG/ML IV BOLUS
INTRAVENOUS | Status: AC
  Filled 2017-08-06: qty 20

## 2017-08-06 MED ORDER — HYDROMORPHONE HCL 1 MG/ML IJ SOLN
0.5000 mg | Freq: Once | INTRAMUSCULAR | Status: DC
  Filled 2017-08-06: qty 1

## 2017-08-06 MED ORDER — HYDROMORPHONE HCL 1 MG/ML IJ SOLN
0.5000 mg | INTRAMUSCULAR | Status: DC | PRN
  Administered 2017-08-06 (×3): 0.5 mg via INTRAVENOUS
  Filled 2017-08-06 (×2): qty 1

## 2017-08-06 MED ORDER — ONDANSETRON HCL 4 MG/2ML IJ SOLN
4.0000 mg | INTRAMUSCULAR | Status: DC | PRN
  Administered 2017-08-06 (×2): 4 mg via INTRAVENOUS
  Filled 2017-08-06 (×2): qty 2

## 2017-08-06 MED ORDER — MIDAZOLAM HCL 2 MG/2ML IJ SOLN
INTRAMUSCULAR | Status: AC
  Filled 2017-08-06: qty 2

## 2017-08-06 MED ORDER — SUCCINYLCHOLINE CHLORIDE 20 MG/ML IJ SOLN
INTRAMUSCULAR | Status: DC | PRN
  Administered 2017-08-06: 100 mg via INTRAVENOUS

## 2017-08-06 MED ORDER — CEFAZOLIN SODIUM-DEXTROSE 1-4 GM/50ML-% IV SOLN
1.0000 g | Freq: Four times a day (QID) | INTRAVENOUS | Status: AC
  Administered 2017-08-06 – 2017-08-07 (×3): 1 g via INTRAVENOUS
  Filled 2017-08-06 (×3): qty 50

## 2017-08-06 MED ORDER — ONDANSETRON HCL 4 MG PO TABS
4.0000 mg | ORAL_TABLET | Freq: Four times a day (QID) | ORAL | Status: DC | PRN
  Administered 2017-08-07 – 2017-08-08 (×2): 4 mg via ORAL
  Filled 2017-08-06 (×2): qty 1

## 2017-08-06 MED ORDER — LIDOCAINE HCL (PF) 2 % IJ SOLN
INTRAMUSCULAR | Status: AC
  Filled 2017-08-06: qty 10

## 2017-08-06 MED ORDER — LIDOCAINE HCL (PF) 1 % IJ SOLN
INTRAMUSCULAR | Status: AC
  Filled 2017-08-06: qty 30

## 2017-08-06 MED ORDER — SEVOFLURANE IN SOLN
RESPIRATORY_TRACT | Status: AC
  Filled 2017-08-06: qty 250

## 2017-08-06 MED ORDER — ENOXAPARIN SODIUM 40 MG/0.4ML ~~LOC~~ SOLN
40.0000 mg | SUBCUTANEOUS | Status: DC
  Administered 2017-08-07 – 2017-08-10 (×4): 40 mg via SUBCUTANEOUS
  Filled 2017-08-06 (×3): qty 0.4

## 2017-08-06 MED ORDER — DEXAMETHASONE SODIUM PHOSPHATE 10 MG/ML IJ SOLN
INTRAMUSCULAR | Status: DC | PRN
  Administered 2017-08-06: 5 mg via INTRAVENOUS

## 2017-08-06 MED ORDER — SENNOSIDES-DOCUSATE SODIUM 8.6-50 MG PO TABS: 1.0000 | ORAL_TABLET | Freq: Every evening | ORAL | Status: DC | PRN

## 2017-08-06 MED ORDER — ONDANSETRON HCL 4 MG/2ML IJ SOLN
INTRAMUSCULAR | Status: DC | PRN
  Administered 2017-08-06: 4 mg via INTRAVENOUS

## 2017-08-06 MED ORDER — NEOMYCIN-POLYMYXIN B GU 40-200000 IR SOLN
Status: AC
  Filled 2017-08-06: qty 4

## 2017-08-06 MED ORDER — HYDROMORPHONE HCL 1 MG/ML IJ SOLN
0.5000 mg | INTRAMUSCULAR | Status: DC | PRN
  Administered 2017-08-06: 0.5 mg via INTRAVENOUS
  Filled 2017-08-06: qty 1

## 2017-08-06 MED ORDER — SUCCINYLCHOLINE CHLORIDE 20 MG/ML IJ SOLN
INTRAMUSCULAR | Status: AC
  Filled 2017-08-06: qty 1

## 2017-08-06 MED ORDER — SODIUM CHLORIDE 0.9 % IV SOLN
INTRAVENOUS | Status: DC
  Administered 2017-08-06 – 2017-08-09 (×3): via INTRAVENOUS

## 2017-08-06 MED ORDER — OXYCODONE-ACETAMINOPHEN 5-325 MG PO TABS
1.0000 | ORAL_TABLET | ORAL | Status: DC | PRN
  Administered 2017-08-06: 2 via ORAL
  Filled 2017-08-06: qty 2

## 2017-08-06 MED ORDER — CYCLOBENZAPRINE HCL 10 MG PO TABS
5.0000 mg | ORAL_TABLET | Freq: Three times a day (TID) | ORAL | Status: DC | PRN
  Administered 2017-08-06: 5 mg via ORAL
  Filled 2017-08-06: qty 1
  Filled 2017-08-06: qty 0.5

## 2017-08-06 MED ORDER — LACTATED RINGERS IV SOLN
INTRAVENOUS | Status: DC
  Administered 2017-08-06: 13:00:00 via INTRAVENOUS

## 2017-08-06 MED ORDER — SODIUM CHLORIDE 0.9 % IV BOLUS: 500.0000 mL | Freq: Once | INTRAVENOUS | Status: DC

## 2017-08-06 MED ORDER — DOCUSATE SODIUM 100 MG PO CAPS
100.0000 mg | ORAL_CAPSULE | Freq: Two times a day (BID) | ORAL | Status: DC
  Administered 2017-08-06 – 2017-08-09 (×5): 100 mg via ORAL
  Filled 2017-08-06 (×6): qty 1

## 2017-08-06 MED ORDER — METHOCARBAMOL 1000 MG/10ML IJ SOLN
500.0000 mg | Freq: Four times a day (QID) | INTRAVENOUS | Status: DC | PRN
  Filled 2017-08-06: qty 5

## 2017-08-06 MED ORDER — ONDANSETRON HCL 4 MG/2ML IJ SOLN
4.0000 mg | Freq: Once | INTRAMUSCULAR | Status: AC
  Administered 2017-08-06: 4 mg via INTRAVENOUS

## 2017-08-06 MED ORDER — SUGAMMADEX SODIUM 200 MG/2ML IV SOLN
INTRAVENOUS | Status: AC
  Filled 2017-08-06: qty 2

## 2017-08-06 MED ORDER — ACETAMINOPHEN 500 MG PO TABS
1000.0000 mg | ORAL_TABLET | Freq: Three times a day (TID) | ORAL | Status: AC
  Administered 2017-08-06 – 2017-08-07 (×2): 1000 mg via ORAL
  Filled 2017-08-06 (×2): qty 2

## 2017-08-06 MED ORDER — PROPOFOL 10 MG/ML IV BOLUS
INTRAVENOUS | Status: DC | PRN
  Administered 2017-08-06: 130 mg via INTRAVENOUS

## 2017-08-06 MED ORDER — LIDOCAINE HCL (CARDIAC) PF 100 MG/5ML IV SOSY
PREFILLED_SYRINGE | INTRAVENOUS | Status: DC | PRN
  Administered 2017-08-06: 80 mg via INTRAVENOUS

## 2017-08-06 MED ORDER — ONDANSETRON HCL 4 MG/2ML IJ SOLN
INTRAMUSCULAR | Status: AC
  Administered 2017-08-06: 04:00:00
  Filled 2017-08-06: qty 2

## 2017-08-06 MED ORDER — FLEET ENEMA 7-19 GM/118ML RE ENEM
1.0000 | ENEMA | Freq: Once | RECTAL | Status: AC | PRN
  Administered 2017-08-09: 1 via RECTAL

## 2017-08-06 MED ORDER — SODIUM CHLORIDE FLUSH 0.9 % IV SOLN
INTRAVENOUS | Status: AC
  Filled 2017-08-06: qty 10

## 2017-08-06 MED ORDER — ONDANSETRON HCL 4 MG/2ML IJ SOLN
4.0000 mg | Freq: Four times a day (QID) | INTRAMUSCULAR | Status: DC | PRN
  Administered 2017-08-07 – 2017-08-08 (×4): 4 mg via INTRAVENOUS
  Filled 2017-08-06 (×4): qty 2

## 2017-08-06 MED ORDER — OXYCODONE HCL 5 MG PO TABS: 5.0000 mg | ORAL_TABLET | ORAL | Status: DC | PRN

## 2017-08-06 MED ORDER — TRAMADOL HCL 50 MG PO TABS
50.0000 mg | ORAL_TABLET | Freq: Four times a day (QID) | ORAL | Status: DC | PRN
  Administered 2017-08-06 – 2017-08-10 (×5): 50 mg via ORAL
  Filled 2017-08-06 (×5): qty 1

## 2017-08-06 MED ORDER — FENTANYL CITRATE (PF) 100 MCG/2ML IJ SOLN
INTRAMUSCULAR | Status: AC
  Administered 2017-08-06: 25 ug via INTRAVENOUS
  Filled 2017-08-06: qty 2

## 2017-08-06 SURGICAL SUPPLY — 53 items
"PENCIL ELECTRO HAND CTR " (MISCELLANEOUS) ×1 IMPLANT
BIT DRILL AO GAMMA 4.2X340 (BIT) ×1 IMPLANT
BLADE SURG 15 STRL LF DISP TIS (BLADE) ×1 IMPLANT
BLADE SURG 15 STRL SS (BLADE) ×1
BNDG COHESIVE 6X5 TAN STRL LF (GAUZE/BANDAGES/DRESSINGS) ×2 IMPLANT
CANISTER SUCT 1200ML W/VALVE (MISCELLANEOUS) ×2 IMPLANT
CHLORAPREP W/TINT 26ML (MISCELLANEOUS) ×2 IMPLANT
DRAPE SHEET LG 3/4 BI-LAMINATE (DRAPES) ×2 IMPLANT
DRAPE SURG 17X11 SM STRL (DRAPES) ×2 IMPLANT
DRAPE U-SHAPE 47X51 STRL (DRAPES) ×3 IMPLANT
DRSG OPSITE POSTOP 3X4 (GAUZE/BANDAGES/DRESSINGS) ×3 IMPLANT
DRSG TEGADERM 4X4.75 (GAUZE/BANDAGES/DRESSINGS) ×5 IMPLANT
ELECT REM PT RETURN 9FT ADLT (ELECTROSURGICAL) ×2
ELECTRODE REM PT RTRN 9FT ADLT (ELECTROSURGICAL) ×1 IMPLANT
GAUZE SPONGE 4X4 12PLY STRL (GAUZE/BANDAGES/DRESSINGS) ×1 IMPLANT
GLOVE BIOGEL PI IND STRL 7.0 (GLOVE) IMPLANT
GLOVE BIOGEL PI IND STRL 8 (GLOVE) ×1 IMPLANT
GLOVE BIOGEL PI INDICATOR 7.0 (GLOVE) ×5
GLOVE BIOGEL PI INDICATOR 8 (GLOVE) ×1
GLOVE SURG SYN 7.5  E (GLOVE) ×1
GLOVE SURG SYN 7.5 E (GLOVE) ×1 IMPLANT
GLOVE SURG SYN 7.5 PF PI (GLOVE) ×1 IMPLANT
GOWN STRL REUS W/ TWL LRG LVL3 (GOWN DISPOSABLE) ×1 IMPLANT
GOWN STRL REUS W/ TWL XL LVL3 (GOWN DISPOSABLE) ×1 IMPLANT
GOWN STRL REUS W/TWL LRG LVL3 (GOWN DISPOSABLE) ×4
GOWN STRL REUS W/TWL XL LVL3 (GOWN DISPOSABLE) ×1
GUIDEROD T2 3X1000 (ROD) ×1 IMPLANT
K-WIRE  3.2X450M STR (WIRE) ×1
K-WIRE 3.2X450M STR (WIRE) ×1
KIT PATIENT CARE HANA TABLE (KITS) ×2 IMPLANT
KIT TURNOVER KIT A (KITS) ×2 IMPLANT
KWIRE 3.2X450M STR (WIRE) IMPLANT
MAT BLUE FLOOR 46X72 FLO (MISCELLANEOUS) ×3 IMPLANT
NAIL TROCH GAMMA 11X18 (Nail) ×1 IMPLANT
NDL FILTER BLUNT 18X1 1/2 (NEEDLE) ×1 IMPLANT
NEEDLE FILTER BLUNT 18X 1/2SAF (NEEDLE) ×1
NEEDLE FILTER BLUNT 18X1 1/2 (NEEDLE) ×1 IMPLANT
NEEDLE HYPO 22GX1.5 SAFETY (NEEDLE) ×2 IMPLANT
NS IRRIG 1000ML POUR BTL (IV SOLUTION) ×2 IMPLANT
PACK HIP COMPR (MISCELLANEOUS) ×2 IMPLANT
PAD ABD DERMACEA PRESS 5X9 (GAUZE/BANDAGES/DRESSINGS) ×2 IMPLANT
PENCIL ELECTRO HAND CTR (MISCELLANEOUS) ×2 IMPLANT
REAMER SHAFT BIXCUT (INSTRUMENTS) ×1 IMPLANT
SCREW LAG GAMMA 3 95MM (Screw) ×1 IMPLANT
SCREW LOCKING T2 F/T  5MMX35MM (Screw) ×1 IMPLANT
SCREW LOCKING T2 F/T  5X32.5MM (Screw) ×1 IMPLANT
SCREW LOCKING T2 F/T 5MMX35MM (Screw) IMPLANT
SCREW LOCKING T2 F/T 5X32.5MM (Screw) IMPLANT
STAPLER SKIN PROX 35W (STAPLE) ×2 IMPLANT
SUT VIC AB 2-0 CT2 27 (SUTURE) ×2 IMPLANT
SYR 10ML LL (SYRINGE) ×2 IMPLANT
SYR 30ML LL (SYRINGE) ×2 IMPLANT
TAPE CLOTH 3X10 WHT NS LF (GAUZE/BANDAGES/DRESSINGS) ×2 IMPLANT

## 2017-08-06 NOTE — Anesthesia Procedure Notes (Signed)
Procedure Name: Intubation Date/Time: 08/06/2017 1:33 PM Performed by: Ginger CarneMichelet, Jamar Weatherall, CRNA Pre-anesthesia Checklist: Patient identified, Patient being monitored, Timeout performed, Emergency Drugs available and Suction available Patient Re-evaluated:Patient Re-evaluated prior to induction Oxygen Delivery Method: Circle system utilized Preoxygenation: Pre-oxygenation with 100% oxygen Induction Type: IV induction Ventilation: Mask ventilation without difficulty Laryngoscope Size: Miller and 2 Grade View: Grade II Tube type: Oral Tube size: 7.0 mm Number of attempts: 1 Airway Equipment and Method: Stylet Placement Confirmation: ETT inserted through vocal cords under direct vision,  positive ETCO2 and breath sounds checked- equal and bilateral Secured at: 23 cm Tube secured with: Tape Dental Injury: Teeth and Oropharynx as per pre-operative assessment

## 2017-08-06 NOTE — Clinical Social Work Placement (Signed)
   CLINICAL SOCIAL WORK PLACEMENT  NOTE  Date:  08/06/2017  Patient Details  Name: Alison Price MRN: [id number] Date of Birth: 03-Apr-1945  Clinical Social Work is seeking post-discharge placement for this patient at the Skilled  Nursing Facility level of care (*CSW will initial, date and re-position this form in  chart as items are completed):  Yes   Patient/family provided with [place] Clinical Social Work Department's list of facilities offering this level of care within the geographic area requested by the patient (or if unable, by the patient's family).  Yes   Patient/family informed of their freedom to choose among providers that offer the needed level of care, that participate in Medicare, Medicaid or managed care program needed by the patient, have an available bed and are willing to accept the patient.  Yes   Patient/family informed of [place]'s ownership interest in [facility] and [facility], as well as of the fact that they are under no obligation to receive care at these facilities.  PASRR submitted to EDS on 08/06/17     PASRR number received on 08/06/17     Existing PASRR number confirmed on       FL2 transmitted to all facilities in geographic area requested by pt/family on 08/06/17     FL2 transmitted to all facilities within larger geographic area on       Patient informed that his/her managed care company has contracts with or will negotiate with certain facilities, including the following:            Patient/family informed of bed offers received.  Patient chooses bed at       Physician recommends and patient chooses bed at      Patient to be transferred to   on  .  Patient to be transferred to facility by       Patient family notified on   of transfer.  Name of family member notified:        PHYSICIAN       Additional Comment:    _______________________________________________ Sonni Barse, Darleen CrockerBailey M, LCSW 08/06/2017, 4:20 PM

## 2017-08-06 NOTE — Clinical Social Work Note (Signed)
Clinical Social Work Assessment  Patient Details  Name: Alison Price MRN: [id number] Date of Birth: 1945-07-20  Date of referral:  08/06/17               Reason for consult:  Facility Placement                Permission sought to share information with:  Chartered certified accountant granted to share information::  Yes, Verbal Permission Granted  Name::      [place]::   [place]   Relationship::     Contact Information:     Housing/Transportation Living arrangements for the past 2 months:  [place] of Information:  Patient, Other (Comment Required)(grandson) Patient Interpreter Needed:  None Criminal Activity/Legal Involvement Pertinent to Current Situation/Hospitalization:  No - Comment as needed Significant Relationships:  Other Family Members Lives with:  Other (Comment)(grandson) Do you feel safe going back to the place where you live?  Yes Need for family participation in patient care:  Yes (Comment)  Care giving concerns:  Patient lives in [place] with her grandson [place].    Social Worker assessment / plan:  Holiday representative (CSW) reviewed chart and noted that patient will have surgery for a hip fracture today. CSW met with patient and her grandson Darrick Meigs was at bedside. Patient was alert and oriented X4 and was laying in the bed. CSW introduced self and explained role of CSW department. 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 Per patient her primary insurance is [employer]. CSW explained that PT will evaluate patient after surgery and make a recommendation of home health or SNF. Patient prefers to go to SNF and requested [facility]. CSW explained that [employer] will have to approve SNF. Patient verbalized her understanding. FL2 complete and faxed out. CSW will continue to follow and assist as needed.   Employment status:  [employer] information:   Managed Care PT Recommendations:  Not assessed at this time Information / Referral to community resources:  [facility]  Patient/Family's Response to care:  Patient prefers SNF.   Patient/Family's Understanding of and Emotional Response to Diagnosis, Current Treatment, and Prognosis:  Patient was very pleasant and thanked CSW for assistance.   Emotional Assessment Appearance:  Appears stated age Attitude/Demeanor/Rapport:    Affect (typically observed):  Accepting, Adaptable, Pleasant Orientation:  Oriented to Self, Oriented to Place, Oriented to  Time, Oriented to Situation Alcohol / Substance use:  Not Applicable Psych involvement (Current and /or in the community):  No (Comment)  Discharge Needs  Concerns to be addressed:  Discharge Planning Concerns Readmission within the last 30 days:  No Current discharge risk:  Dependent with Mobility Barriers to Discharge:  Continued Medical Work up   [employer], Veronia Beets, LCSW 08/06/2017, 4:22 PM

## 2017-08-06 NOTE — NC FL2 (Signed)
[place] MEDICAID FL2 LEVEL OF CARE SCREENING TOOL     IDENTIFICATION  Patient Name: Alison Price Birthdate: 08/22/1945 Sex: female Admission Date (Current Location): 08/05/2017  [place]   Facility and Address:  [facility], [address], [place], [place] [address]      Provider Number: [id number]  Attending Physician Name and Address:  Ramonita LabGouru, Aruna, MD  Relative Name and Phone Number:       Current Level of Care: Hospital Recommended Level of Care: Skilled Nursing Facility Prior Approval Number:    Date Approved/Denied:   PASRR Number: [id number])  Discharge Plan: SNF    Current Diagnoses: Patient Active Problem List   Diagnosis Date Noted  . Hip fx (HCC) 08/05/2017  . Intractable nausea and vomiting 10/23/2016  . Hypokalemia 10/23/2016  . Dehydration 10/23/2016  . HTN (hypertension) 10/23/2016  . History of total abdominal hysterectomy 06/26/2015  . Pelvic pressure in female 06/26/2015  . Vaginal atrophy 06/26/2015  . Hematuria 06/26/2015  . Dysuria 06/26/2015  . Pancytopenia (HCC) 04/13/2015    Orientation RESPIRATION BLADDER Height & Weight     Self, Time, Situation, Place  O2(2 Liters Oxygen. ) Continent Weight: 140 lb (63.5 kg) Height:  5\' 8"  (172.7 cm)  BEHAVIORAL SYMPTOMS/MOOD NEUROLOGICAL BOWEL NUTRITION STATUS      Continent Diet(Diet: NPO for surgery to be advanced. )  AMBULATORY STATUS COMMUNICATION OF NEEDS Skin   Extensive Assist Verbally Surgical wounds(Incision: Left Hip. )                       Personal Care Assistance Level of Assistance  Bathing, Feeding, Dressing Bathing Assistance: Limited assistance Feeding assistance: Independent Dressing Assistance: Limited assistance     Functional Limitations Info  Sight, Hearing, Speech Sight Info: Adequate Hearing Info: Adequate Speech Info: Adequate    SPECIAL CARE FACTORS FREQUENCY  PT (By licensed PT), OT (By  licensed OT)     PT Frequency: (5) OT Frequency: (5)            Contractures      Additional Factors Info  Code Status, Allergies Code Status Info: (Full Code. ) Allergies Info: (Codeine, Sulfa Antibiotics)           Current Medications (08/06/2017):  This is the current hospital active medication list Current Facility-Administered Medications  Medication Dose Route Frequency Provider Last Rate Last Dose  . 0.9 %  sodium chloride infusion   Intravenous Continuous Cammy CopaMaier, Angela, MD 75 mL/hr at 08/05/17 2311    . [MAR Hold] acetaminophen (TYLENOL) tablet 650 mg  650 mg Oral Q6H PRN Cammy CopaMaier, Angela, MD       Or  . Mitzi Hansen[MAR Hold] acetaminophen (TYLENOL) suppository 650 mg  650 mg Rectal Q6H PRN Cammy CopaMaier, Angela, MD      . Mitzi Hansen[MAR Hold] amLODipine (NORVASC) tablet 10 mg  10 mg Oral Daily Cammy CopaMaier, Angela, MD   10 mg at 08/06/17 0827  . [MAR Hold] bisacodyl (DULCOLAX) EC tablet 5 mg  5 mg Oral Daily PRN Cammy CopaMaier, Angela, MD      . Mitzi Hansen[MAR Hold] butalbital-acetaminophen-caffeine (FIORICET, ESGIC) [phone] MG per tablet 1 tablet  1 tablet Oral Q6H PRN Cammy CopaMaier, Angela, MD      . Mitzi Hansen[MAR Hold] cyclobenzaprine (FLEXERIL) tablet 5 mg  5 mg Oral TID PRN Barbaraann RondoSridharan, Prasanna, MD   5 mg at 08/06/17 0335  . [MAR Hold] docusate sodium (COLACE) capsule 100 mg  100 mg Oral BID Caryn BeeMaier,  Marylene Land, MD   Stopped at 08/06/17 0830  . [MAR Hold] fenofibrate tablet 160 mg  160 mg Oral Daily Cammy Copa, MD   Stopped at 08/06/17 0830  . fentaNYL (SUBLIMAZE) injection 25-50 mcg  25-50 mcg Intravenous Q5 min PRN Piscitello, Cleda Mccreedy, MD   25 mcg at 08/06/17 1613  . [MAR Hold] ferrous sulfate tablet 325 mg  325 mg Oral Q breakfast Cammy Copa, MD   325 mg at 08/06/17 1610  . [MAR Hold] HYDROmorphone (DILAUDID) injection 0.5 mg  0.5 mg Intravenous Q4H PRN Cammy Copa, MD   0.5 mg at 08/06/17 1145  . [MAR Hold] ipratropium-albuterol (DUONEB) 0.5-2.5 (3) MG/3ML nebulizer solution 3 mL  3 mL Nebulization Q6H PRN Cammy Copa, MD      .  lactated ringers infusion   Intravenous Continuous Piscitello, Cleda Mccreedy, MD 50 mL/hr at 08/06/17 1313    . [MAR Hold] lisinopril (PRINIVIL,ZESTRIL) tablet 20 mg  20 mg Oral Daily Cammy Copa, MD   20 mg at 08/06/17 9604  . [MAR Hold] loratadine (CLARITIN) tablet 10 mg  10 mg Oral Daily Cammy Copa, MD   Stopped at 08/06/17 [phone]  . [MAR Hold] morphine (MS CONTIN) 12 hr tablet 60 mg  60 mg Oral TID Cammy Copa, MD   60 mg at 08/06/17 1005  . [MAR Hold] multivitamin with minerals tablet 1 tablet  1 tablet Oral Daily Cammy Copa, MD   Stopped at 08/06/17 [phone]  . ondansetron (ZOFRAN) 4 MG/2ML injection           . [MAR Hold] ondansetron (ZOFRAN) injection 4 mg  4 mg Intravenous Q4H PRN Cammy Copa, MD   4 mg at 08/06/17 1145  . [MAR Hold] oxyCODONE (Oxy IR/ROXICODONE) immediate release tablet 10 mg  10 mg Oral TID Cammy Copa, MD   10 mg at 08/06/17 1005  . oxyCODONE (Oxy IR/ROXICODONE) immediate release tablet 5 mg  5 mg Oral Once PRN Piscitello, Cleda Mccreedy, MD       Or  . oxyCODONE (ROXICODONE) 5 MG/5ML solution 5 mg  5 mg Oral Once PRN Piscitello, Cleda Mccreedy, MD      . Mitzi Hansen Hold] oxyCODONE-acetaminophen (PERCOCET/ROXICET) 5-325 MG per tablet 1-2 tablet  1-2 tablet Oral Q4H PRN Hooten, Illene Labrador, MD   2 tablet at 08/06/17 0827  . [MAR Hold] pantoprazole (PROTONIX) EC tablet 40 mg  40 mg Oral Daily Cammy Copa, MD      . Mitzi Hansen Hold] promethazine (PHENERGAN) injection 12.5 mg  12.5 mg Intravenous Q6H PRN Cammy Copa, MD   12.5 mg at 08/06/17 1478  . sodium chloride flush 0.9 % injection           . [MAR Hold] traZODone (DESYREL) tablet 25 mg  25 mg Oral QHS PRN Cammy Copa, MD         Discharge Medications: Please see discharge summary for a list of discharge medications.  Relevant Imaging Results:  Relevant Lab Results:   Additional Information (SSN: [id number])  Alison Price, Alison Crocker, LCSW

## 2017-08-06 NOTE — Transfer of Care (Signed)
Immediate Anesthesia Transfer of Care Note  Patient: Alison Price  Procedure(s) Performed: INTRAMEDULLARY (IM) NAIL INTERTROCHANTRIC (Left Hip)  Patient Location: PACU  Anesthesia Type:General  Level of Consciousness: sedated  Airway & Oxygen Therapy: Patient Spontanous Breathing and Patient connected to face mask oxygen  Post-op Assessment: Report given to RN and Post -op Vital signs reviewed and stable  Post vital signs: Reviewed and stable  Last Vitals:  Vitals Value Taken Time  BP 111/56 08/06/2017  3:11 PM  Temp 36.9 C 08/06/2017  3:11 PM  Pulse 69 08/06/2017  3:13 PM  Resp 13 08/06/2017  3:13 PM  SpO2 100 % 08/06/2017  3:13 PM  Vitals shown include unvalidated device data.  Last Pain:  Vitals:   08/06/17 1511  TempSrc: Temporal  PainSc:       Patients Stated Pain Goal: 5 (08/06/17 1100)  Complications: No apparent anesthesia complications

## 2017-08-06 NOTE — Consult Note (Signed)
ORTHOPAEDIC CONSULTATION  PATIENT NAME: Alison Price DOB: [birth year]  MRN: [id number]  REQUESTING PHYSICIAN: Milagros Loll, MD  Chief Complaint: Left hip pain  HPI: [name] is a 72 y.o. female who sustained a mechanical fall while at the grocery store yesterday and fell, landing on her left hip and side.  She was unable to stand or bear weight due to severe left hip pain.  She denied any other injury.  She denied any loss of consciousness.  Prior to the fall she was ambulating independently.  Past Medical History:  Diagnosis Date  . Anemia   . Arthritis   . Hypertension   . Osteoporosis   . Stevens-Johnson syndrome [facility])    Past Surgical History:  Procedure Laterality Date  . ABDOMINAL HYSTERECTOMY     partial  . BACK SURGERY    . FOOT SURGERY    . HAND SURGERY     THUMB REPAIR  . HEMORRHOID SURGERY    . POSTERIOR REPAIR     Social History   Socioeconomic History  . Marital status: Divorced    Spouse name: Not on file  . Number of children: Not on file  . Years of education: Not on file  . Highest education level: Not on file  Occupational History  . Not on file  Social Needs  . Financial resource strain: Not on file  . Food insecurity:    Worry: Not on file    Inability: Not on file  . Transportation needs:    Medical: Not on file    Non-medical: Not on file  Tobacco Use  . Smoking status: Never Smoker  . Smokeless tobacco: Never Used  Substance and Sexual Activity  . Alcohol use: No  . Drug use: No  . Sexual activity: Not Currently    Birth control/protection: Surgical, None  Lifestyle  . Physical activity:    Days per week: Not on file    Minutes per session: Not on file  . Stress: Not on file  Relationships  . Social connections:    Talks on phone: Not on file    Gets together: Not on file    Attends religious service: Not on file    Active member of club or organization: Not on file    Attends meetings of clubs or  organizations: Not on file    Relationship status: Not on file  Other Topics Concern  . Not on file  Social History Narrative  . Not on file   Family History  Problem Relation Age of Onset  . Diabetes Paternal Grandmother   . Uterine cancer Maternal Aunt   . Stomach cancer Maternal Aunt   . Breast cancer Neg Hx   . Ovarian cancer Neg Hx   . Colon cancer Neg Hx   . Heart disease Neg Hx    Allergies  Allergen Reactions  . Codeine Nausea And Vomiting  . Sulfa Antibiotics Other (See Comments)    STEVEN JOHNSON SYNDROME   Prior to Admission medications   Medication Sig Start Date End Date Taking? Authorizing Provider  albuterol-ipratropium (COMBIVENT) 18-103 MCG/ACT inhaler Inhale 2 puffs into the lungs every 6 (six) hours as needed for wheezing or shortness of breath.   Yes [provider]  amLODipine (NORVASC) 10 MG tablet Take 10 mg by mouth daily.  04/09/15  Yes [provider]  aspirin 81 MG tablet Take 81 mg by mouth daily.   Yes [provider]  butalbital-acetaminophen-caffeine (FIORICET, ESGIC) [phone]  MG tablet Take 1 tablet by mouth every 6 (six) hours as needed for headache or migraine.   Yes [provider]  Choline Fenofibrate (FENOFIBRIC ACID) 135 MG CPDR Take 135 mg by mouth daily.  04/09/15  Yes [provider]  etodolac (LODINE) 400 MG tablet Take 400 mg by mouth 2 (two) times daily.   Yes [provider]  Ferrous Sulfate (SLOW FE) 142 (45 Fe) MG TBCR Take 1 tablet by mouth daily.   Yes [provider]  fexofenadine (ALLEGRA) 180 MG tablet Take 180 mg by mouth daily.   Yes [provider]  Fluticasone-Salmeterol (ADVAIR) 250-50 MCG/DOSE AEPB Inhale 1 puff into the lungs 2 (two) times daily.   Yes [provider]  lisinopril (PRINIVIL,ZESTRIL) 20 MG tablet Take 20 mg by mouth daily.   Yes [provider]  meclizine (ANTIVERT) 25 MG tablet Take 25 mg by mouth 2 (two) times daily as  needed for dizziness.   Yes [provider]  morphine (MS CONTIN) 60 MG 12 hr tablet Take 60 mg by mouth 3 (three) times daily.  03/15/15  Yes [provider]  Multiple Vitamin (MULTIVITAMIN) tablet Take 1 tablet by mouth daily.   Yes [provider]  Oxycodone HCl 10 MG TABS Take 10 mg by mouth 3 (three) times daily.   Yes [provider]  pantoprazole (PROTONIX) 40 MG tablet Take 40 mg by mouth daily.    Yes [provider]  tiZANidine (ZANAFLEX) 4 MG tablet Take 4 mg by mouth every 6 (six) hours as needed.  01/17/15  Yes [provider]  conjugated estrogens (PREMARIN) vaginal cream 1 gram intravaginal BIW Patient not taking: Reported on 10/23/2016 06/26/15   Defrancesco, Prentice Docker, MD  ondansetron (ZOFRAN) 4 MG tablet Take 1 tablet (4 mg total) by mouth every 8 (eight) hours as needed for nausea or vomiting. Patient not taking: Reported on 08/05/2017 10/25/16   Shaune Pollack, MD  oxyCODONE-acetaminophen (ROXICET) 5-325 MG tablet Take 1 tablet by mouth every 6 (six) hours as needed. Patient not taking: Reported on 08/05/2017 02/20/16   Minna Antis, MD   Dg Chest Port 1 View  Result Date: 08/05/2017 CLINICAL DATA:  Larey Seat in store.  Suspect femur fracture. EXAM: PORTABLE CHEST 1 VIEW COMPARISON:  Chest radiograph March 02, 2017 FINDINGS: Cardiac silhouette is mildly enlarged, accentuated by rotation to the LEFT. Stable chronic interstitial changes with LEFT lung base scarring. No pleural effusion or focal consolidation. No pneumothorax. Osteopenia. IMPRESSION: Mild cardiomegaly and chronic interstitial changes. Electronically Signed   By: Awilda Metro M.D.   On: 08/05/2017 21:07   Dg Hip Unilat With Pelvis 2-3 Views Left  Result Date: 08/05/2017 CLINICAL DATA:  Larey Seat in store.  Suspect hip fracture. EXAM: DG HIP (WITH OR WITHOUT PELVIS) 2-3V LEFT COMPARISON:  None. FINDINGS: Acute nondisplaced LEFT femur femoral neck to greater trochanter. No  dislocation. No destructive bony lesions. Humeral heads are located. Osteopenia. Soft tissue planes are non suspicious. Lower lumbar disc prosthesis. IMPRESSION: Acute nondisplaced LEFT femur fracture extending through the greater trochanter. Electronically Signed   By: Awilda Metro M.D.   On: 08/05/2017 21:06    Positive ROS: All other systems have been reviewed and were otherwise negative with the exception of those mentioned in the HPI and as above.  Physical Exam: General: Well developed, well nourished female seen in no apparent discomfort. HEENT: Atraumatic and normocephalic. Sclera are clear. Extraocular motion is intact. Oropharynx is clear with moist mucosa.  Neck: Supple, nontender, good range of motion. No JVD or carotid bruits. Lungs: Clear to auscultation bilaterally. Cardiovascular: Regular rate and rhythm with normal S1 and S2. 2/6 murmur. No gallops or rubs. Pedal pulses are palpable bilaterally. Homans test is negative bilaterally. No significant pretibial or ankle edema. Abdomen: Soft, nontender, and nondistended. Bowel sounds are present. Skin: No lesions in the area of chief complaint Neurologic: Awake, alert, and oriented. Sensory function is grossly intact. Motor strength is felt to be 5 over 5 bilaterally with the exception of the left lower extremity that was not assessed due to the injury. No clonus or tremor. Good motor coordination. Lymphatic: No axillary or cervical lymphadenopathy  MUSCULOSKELETAL: Examination of the left lower extremity is notable for shortening and external rotation.  Pain is elicited with any attempted range of motion of the left hip.  There is some mild swelling to the thigh.  No gross ecchymosis at this time.  No tenderness to palpation about the knee or ankle.  Assessment: Displaced left intertrochanteric femur fracture  Plan: The findings were discussed in detail with the patient.  Recommendation was made for open reduction and internal  fixation of the left intertrochanteric femur fracture.  The usual perioperative course was discussed. The risks and benefits of surgical intervention were reviewed. The patient expressed understanding of the risks and benefits and agreed with plans for surgical intervention.   The surgical site was signed as per the "right site surgery" protocol.   Sanskriti Greenlaw P. Angie FavaHooten, Jr. M.D.

## 2017-08-06 NOTE — Progress Notes (Signed)
zofran given for nausea  

## 2017-08-06 NOTE — Progress Notes (Signed)
Patient with c/o 9/10 pain. BP dropped to 77/51. Prime doc notified, new order bolus over 1 hr. Placed in trendelenburg, BP recheck 111/64.

## 2017-08-06 NOTE — H&P (Signed)
H&P reviewed. No significant changes noted.  

## 2017-08-06 NOTE — Op Note (Signed)
DATE OF SURGERY: 08/06/2017  PREOPERATIVE DIAGNOSIS: Left intertrochanteric hip fracture  POSTOPERATIVE DIAGNOSIS: Left intertrochanteric hip fracture  PROCEDURE: Intramedullary nailing of Left femur with cephalomedullary device  SURGEON: Rosealee AlbeeSunny H. Vicktoria Muckey, MD  ASSISTANTS: none  EBL: 100 cc  COMPONENTS:  [employer] Short Gamma Nail: 11x1280mm; 95mm lag screw; 32.265mm distal interlocking screw   INDICATIONS: Alison Price is a 72 y.o. female who sustained an intertrochanteric fracture after a fall. Risks and benefits of intramedullary nailing were explained to the patient. Risks include but are not limited to bleeding, infection, injury to tissues, nerves, vessels, periprosthetic infection, DVT/PE, malunion/nonunion, and risks of anesthesia. The patient understands these risks, has completed an informed consent, and wishes to proceed.   PROCEDURE:  The patient was brought into the operating room. After administering anesthesia, the patient was placed in the supine position on the Hana table. The uninjured leg was extended while the injured lower extremity was placed in longitudinal traction. The fracture was reduced using longitudinal traction and internal rotation. The adequacy of reduction was verified fluoroscopically in AP and lateral projections and found to be near anatomic. The lateral aspects of the operative hip and thigh were prepped with ChloraPrep solution before being draped sterilely. Preoperative IV antibiotics were administered. A timeout was performed to verify the appropriate surgical site, patient, and procedure.   The greater trochanter was identified and an approximately 6cm incision was made about 3 fingerbreadths above the tip of the greater trochanter. The incision was carried down through the subcutaneous tissues to expose the gluteal fascia. This was split the length of the incision, providing access to the tip of the trochanter. Under fluoroscopic guidance, a guidewire  was drilled through the tip of the trochanter into the proximal metaphysis to the level of the lesser trochanter. After verifying its position fluoroscopically in AP and lateral projections, it was overreamed with the opening reamer to the level of the lesser trochanter. A guidewire was passed down through the femoral canal. The adequacy of guidewire position was verified fluoroscopically in AP and lateral projections. The guidewire was overreamed sequentially using the flexible reamers. The [employer] Short Gamma Nail was advanced to the appropriate depth as verified fluoroscopically.   The guide system for the lag screw was positioned and advanced through an approximately 4 cm stab incision over the lateral aspect of the proximal femur. The guidewire was drilled up through the femoral nail and into the femoral neck to rest within 5 mm of subchondral bone. After verifying its position in the femoral neck and head in both AP and lateral projections, the guidewire was measured and found to be optimally replicated by a 95 mm lag screw. The guidewire was overreamed to the appropriate depth before the lag screw was inserted and advanced to the appropriate depth as verified fluoroscopically in AP and lateral projections. The set screw was tightened and then untightened a quarter turn to allow for compression. Again, the adequacy of hardware position and fracture reduction was verified fluoroscopically in AP and lateral projections and found to be excellent.  Attention was directed distally and a 3cm incision was made for the distal interlocking screw. The aiming arm was used to drill and a 32.855mm screw was selected and advanced. There was excellent purchase distally. Adequacy of screw position was verified fluoroscopically in AP and lateral projections and found to be excellent.  The wounds were irrigated thoroughly with sterile saline solution. Local anesthetic was injected into the wounds.The subcutaneous tissues  were closed using 2-0  Vicryl interrupted sutures. The skin was closed using staples. Sterile occlusive dressings were applied to all wounds. The patient was then transferred to the recovery room in satisfactory condition after tolerating the procedure well.  POSTOPERATIVE PLAN: The patient will be WBAT on the operative extremity. Lovenox 40mg /day x 4 weeks to start on POD#1. Ancef x 24 hours. PT/OT on POD#1.

## 2017-08-06 NOTE — Consult Note (Addendum)
ORTHOPAEDIC CONSULTATION  I am taking over this patient's care from Dr. Francesco Sor.  REQUESTING PHYSICIAN: Ramonita Lab, MD  Chief Complaint:   L hip pain  History of Present Illness:  Alison Price is a 72 y.o. female who had a fall after her foot caught something while at the grocery store yesterday.  The patient noted immediate hip pain and inability to ambulate.  The patient ambulates unassisted at baseline. 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 Pain is described as sharp at its worst and a dull ache at its best.  Pain is rated a 10 out of 10 in severity.  Pain is improved with rest and immobilization.  Pain is worse with any sort of movement.  X-rays in the emergency department show a left intertrochanteric hip fracture.  Past Medical History:  Diagnosis Date  . Anemia   . Arthritis   . Hypertension   . Osteoporosis   . Stevens-Johnson syndrome [facility])    Past Surgical History:  Procedure Laterality Date  . ABDOMINAL HYSTERECTOMY     partial  . BACK SURGERY    . FOOT SURGERY    . HAND SURGERY     THUMB REPAIR  . HEMORRHOID SURGERY    . POSTERIOR REPAIR     Social History   Socioeconomic History  . Marital status: Divorced    Spouse name: Not on file  . Number of children: Not on file  . Years of education: Not on file  . Highest education level: Not on file  Occupational History  . Not on file  Social Needs  . Financial resource strain: Not on file  . Food insecurity:    Worry: Not on file    Inability: Not on file  . Transportation needs:    Medical: Not on file    Non-medical: Not on file  Tobacco Use  . Smoking status: Never Smoker  . Smokeless tobacco: Never Used  Substance and Sexual Activity  . Alcohol use: No  . Drug use: No  . Sexual activity: Not Currently    Birth control/protection: Surgical, None  Lifestyle  . Physical activity:    Days per week: Not  on file    Minutes per session: Not on file  . Stress: Not on file  Relationships  . Social connections:    Talks on phone: Not on file    Gets together: Not on file    Attends religious service: Not on file    Active member of club or organization: Not on file    Attends meetings of clubs or organizations: Not on file    Relationship status: Not on file  Other Topics Concern  . Not on file  Social History Narrative  . Not on file   Family History  Problem Relation Age of Onset  . Diabetes Paternal Grandmother   . Uterine cancer Maternal Aunt   . Stomach cancer Maternal Aunt   . Breast cancer Neg Hx   . Ovarian cancer Neg Hx   . Colon cancer Neg Hx   . Heart disease Neg Hx    Allergies  Allergen Reactions  . Codeine Nausea And Vomiting  . Sulfa Antibiotics Other (See Comments)    STEVEN JOHNSON SYNDROME   Prior to Admission medications   Medication Sig Start Date End Date Taking? Authorizing Provider  albuterol-ipratropium (COMBIVENT) 18-103 MCG/ACT inhaler Inhale 2 puffs into the lungs every 6 (six) hours as needed for wheezing or shortness of breath.  Yes [provider]  amLODipine (NORVASC) 10 MG tablet Take 10 mg by mouth daily.  04/09/15  Yes [provider]  aspirin 81 MG tablet Take 81 mg by mouth daily.   Yes [provider]  butalbital-acetaminophen-caffeine (FIORICET, ESGIC) 50-325-40 MG tablet Take 1 tablet by mouth every 6 (six) hours as needed for headache or migraine.   Yes [provider]  Choline Fenofibrate (FENOFIBRIC ACID) 135 MG CPDR Take 135 mg by mouth daily.  04/09/15  Yes [provider]  etodolac (LODINE) 400 MG tablet Take 400 mg by mouth 2 (two) times daily.   Yes [provider]  Ferrous Sulfate (SLOW FE) 142 (45 Fe) MG TBCR Take 1 tablet by mouth daily.   Yes [provider]  fexofenadine (ALLEGRA) 180 MG tablet Take 180 mg by mouth daily.   Yes [provider]   Fluticasone-Salmeterol (ADVAIR) 250-50 MCG/DOSE AEPB Inhale 1 puff into the lungs 2 (two) times daily.   Yes [provider]  lisinopril (PRINIVIL,ZESTRIL) 20 MG tablet Take 20 mg by mouth daily.   Yes [provider]  meclizine (ANTIVERT) 25 MG tablet Take 25 mg by mouth 2 (two) times daily as needed for dizziness.   Yes [provider]  morphine (MS CONTIN) 60 MG 12 hr tablet Take 60 mg by mouth 3 (three) times daily.  03/15/15  Yes [provider]  Multiple Vitamin (MULTIVITAMIN) tablet Take 1 tablet by mouth daily.   Yes [provider]  Oxycodone HCl 10 MG TABS Take 10 mg by mouth 3 (three) times daily.   Yes [provider]  pantoprazole (PROTONIX) 40 MG tablet Take 40 mg by mouth daily.    Yes [provider]  tiZANidine (ZANAFLEX) 4 MG tablet Take 4 mg by mouth every 6 (six) hours as needed.  01/17/15  Yes [provider]  conjugated estrogens (PREMARIN) vaginal cream 1 gram intravaginal BIW Patient not taking: Reported on 10/23/2016 06/26/15   Defrancesco, Prentice DockerMartin A, MD  ondansetron (ZOFRAN) 4 MG tablet Take 1 tablet (4 mg total) by mouth every 8 (eight) hours as needed for nausea or vomiting. Patient not taking: Reported on 08/05/2017 10/25/16   Shaune Pollackhen, Qing, MD  oxyCODONE-acetaminophen (ROXICET) 5-325 MG tablet Take 1 tablet by mouth every 6 (six) hours as needed. Patient not taking: Reported on 08/05/2017 02/20/16   Minna AntisPaduchowski, Kevin, MD   Recent Labs    08/05/17 2008 08/06/17 0330 08/06/17 0951  WBC 5.1 4.7  --   HGB 10.6* 10.1*  --   HCT 30.9* 29.9*  --   PLT 109* 98*  --   K 4.1 4.5  --   CL 105 106  --   CO2 21* 21*  --   BUN 41* 34*  --   CREATININE 1.33* 0.95  --   GLUCOSE 104* 115*  --   CALCIUM 8.7* 8.7*  --   INR  --   --  1.10   Dg Chest Port 1 View  Result Date: 08/05/2017 CLINICAL DATA:  Larey SeatFell in store.  Suspect femur fracture. EXAM: PORTABLE CHEST 1 VIEW COMPARISON:  Chest radiograph March 02, 2017 FINDINGS: Cardiac silhouette is mildly enlarged, accentuated by rotation to the LEFT. Stable chronic interstitial changes with LEFT lung base scarring. No pleural effusion or focal consolidation. No pneumothorax. Osteopenia. IMPRESSION: Mild cardiomegaly and chronic interstitial changes. Electronically Signed   By: Awilda Metroourtnay  Bloomer M.D.   On: 08/05/2017 21:07   Dg Hip Unilat With  Pelvis 2-3 Views Left  Result Date: 08/05/2017 CLINICAL DATA:  Larey Seat in store.  Suspect hip fracture. EXAM: DG HIP (WITH OR WITHOUT PELVIS) 2-3V LEFT COMPARISON:  None. FINDINGS: Acute nondisplaced LEFT femur femoral neck to greater trochanter. No dislocation. No destructive bony lesions. Humeral heads are located. Osteopenia. Soft tissue planes are non suspicious. Lower lumbar disc prosthesis. IMPRESSION: Acute nondisplaced LEFT femur fracture extending through the greater trochanter. Electronically Signed   By: Awilda Metro M.D.   On: 08/05/2017 21:06     Positive ROS: All other systems have been reviewed and were otherwise negative with the exception of those mentioned in the HPI and as above.  Physical Exam: BP 135/66   Pulse 97   Temp 97.7 F (36.5 C) (Tympanic)   Resp 17   Ht 5\' 8"  (1.727 m)   Wt 63.5 kg (140 lb)   SpO2 97%   BMI 21.29 kg/m  General:  Alert, no acute distress Psychiatric:  Patient is competent for consent with normal mood and affect   Cardiovascular:  No pedal edema, regular rate and rhythm Respiratory:  No wheezing, non-labored breathing GI:  Abdomen is soft and non-tender Skin:  No lesions in the area of chief complaint, no erythema Neurologic:  Sensation intact distally, CN grossly intact Lymphatic:  No axillary or cervical lymphadenopathy  Orthopedic Exam:  LLE: + DF/PF/EHL SILT grossly over foot Foot wwp +Log roll/axial load   X-rays:  As above: L intertrochanteric hip fracture with displacement  Assessment/Plan: Alison Price is a 72 y.o. female with a L  intertrochanteric hip fracture  1. I discussed the various treatment options including both surgical and non-surgical management of her fracture with the patient. We discussed the high risk of perioperative complications due to patient's age and other co-morbidities. After discussion of risks, benefits, and alternatives to surgery, the patient was in agreement to proceed with surgery. The goals of surgery would be to provide adequate pain relief and allow for early mobilization. Plan for surgery is L hip cephalomedullary nailing today, 08/06/2017. 2. NPO until OR 3. Hold anticoagulation in advance of OR 4. Patient cleared for OR by Hospitalist service          Signa Kell   08/06/2017 1:13 PM

## 2017-08-06 NOTE — Progress Notes (Addendum)
Pt complaining of pain 10/10. Md made aware. Order for dilaudid 0.5mg  obtained.

## 2017-08-06 NOTE — H&P (Signed)
[facility] - [place] at [facility]   PATIENT NAME: Alison Price    MR#:  [id number]  DATE OF BIRTH:  06-26-1945  DATE OF ADMISSION:  08/05/2017  PRIMARY CARE PHYSICIAN: Jaclyn Shaggy, MD   REQUESTING/REFERRING PHYSICIAN:   CHIEF COMPLAINT:   Chief Complaint  Patient presents with  . Hip Pain  . Fall    HISTORY OF PRESENT ILLNESS: Alison Price  is a 72 y.o. female with a known history of chronic pain, hypertension and anemia. Patient was brought to emergency room via EMS status post mechanical fall while she was at the [employer], few hours ago.  She landed on her left side and immediately started to complain of severe left hip pain.  She has been unable to get up or bear weight on the left leg, since the fall.  Patient continues to complain of severe pain and left hip joint worse with movement.  She denies losing her consciousness or hitting her head.  Fentanyl IV helped very little with her pain. She denies any chest pain or shortness of breath with exertion.  No history of heart disease.  She is usually able to walk more than 100 feet without any chest pain or shortness of breath.  Her physical activity is limited though by her chronic pain. Blood test done in emergency room revealed the elevated creatinine level of 1.33.  Hemoglobin level is low at 10.6.  Platelet count is 109. Left hip x-ray, reviewed by myself, shows acute nondisplaced LEFT femur fracture extending through the greater trochanter. Chest x-ray negative for any acute cardiopulmonary disease. EKG shows normal sinus rhythm with heart rate at 92, normal axis, normal intervals.  No ST-T changes.    PAST MEDICAL HISTORY:   Past Medical History:  Diagnosis Date  . Anemia   . Arthritis   . Hypertension   . Osteoporosis   . Stevens-Johnson syndrome (HCC)     PAST SURGICAL HISTORY:  Past Surgical History:  Procedure Laterality Date  . ABDOMINAL HYSTERECTOMY     partial  . BACK  SURGERY    . FOOT SURGERY    . HAND SURGERY     THUMB REPAIR  . HEMORRHOID SURGERY    . POSTERIOR REPAIR      SOCIAL HISTORY:  Social History   Tobacco Use  . Smoking status: Never Smoker  . Smokeless tobacco: Never Used  Substance Use Topics  . Alcohol use: No    FAMILY HISTORY:  Family History  Problem Relation Age of Onset  . Diabetes Paternal Grandmother   . Uterine cancer Maternal Aunt   . Stomach cancer Maternal Aunt   . Breast cancer Neg Hx   . Ovarian cancer Neg Hx   . Colon cancer Neg Hx   . Heart disease Neg Hx     DRUG ALLERGIES:  Allergies  Allergen Reactions  . Codeine Nausea And Vomiting  . Sulfa Antibiotics Other (See Comments)    STEVEN JOHNSON SYNDROME    REVIEW OF SYSTEMS:   CONSTITUTIONAL: No fever, fatigue or weakness.  EYES: No blurred or double vision.  EARS, NOSE, AND THROAT: No tinnitus or ear pain.  RESPIRATORY: No cough, shortness of breath, wheezing or hemoptysis.  CARDIOVASCULAR: No chest pain, orthopnea, edema.  GASTROINTESTINAL: No nausea, vomiting, diarrhea or abdominal pain.  GENITOURINARY: No dysuria, hematuria.  ENDOCRINE: No polyuria, nocturia,  HEMATOLOGY: No anemia, easy bruising or bleeding SKIN: No rash or lesion. MUSCULOSKELETAL: Positive for left hip severe pain.  NEUROLOGIC: No focal weakness.  PSYCHIATRY: No anxiety or depression.   MEDICATIONS AT HOME:  Prior to Admission medications   Medication Sig Start Date End Date Taking? Authorizing Provider  albuterol-ipratropium (COMBIVENT) 18-103 MCG/ACT inhaler Inhale 2 puffs into the lungs every 6 (six) hours as needed for wheezing or shortness of breath.   Yes [provider]  amLODipine (NORVASC) 10 MG tablet Take 10 mg by mouth daily.  04/09/15  Yes [provider]  aspirin 81 MG tablet Take 81 mg by mouth daily.   Yes [provider]  butalbital-acetaminophen-caffeine (FIORICET, ESGIC) 50-325-40 MG tablet Take 1 tablet by mouth every 6  (six) hours as needed for headache or migraine.   Yes [provider]  Choline Fenofibrate (FENOFIBRIC ACID) 135 MG CPDR Take 135 mg by mouth daily.  04/09/15  Yes [provider]  etodolac (LODINE) 400 MG tablet Take 400 mg by mouth 2 (two) times daily.   Yes [provider]  Ferrous Sulfate (SLOW FE) 142 (45 Fe) MG TBCR Take 1 tablet by mouth daily.   Yes [provider]  fexofenadine (ALLEGRA) 180 MG tablet Take 180 mg by mouth daily.   Yes [provider]  Fluticasone-Salmeterol (ADVAIR) 250-50 MCG/DOSE AEPB Inhale 1 puff into the lungs 2 (two) times daily.   Yes [provider]  lisinopril (PRINIVIL,ZESTRIL) 20 MG tablet Take 20 mg by mouth daily.   Yes [provider]  meclizine (ANTIVERT) 25 MG tablet Take 25 mg by mouth 2 (two) times daily as needed for dizziness.   Yes [provider]  morphine (MS CONTIN) 60 MG 12 hr tablet Take 60 mg by mouth 3 (three) times daily.  03/15/15  Yes [provider]  Multiple Vitamin (MULTIVITAMIN) tablet Take 1 tablet by mouth daily.   Yes [provider]  Oxycodone HCl 10 MG TABS Take 10 mg by mouth 3 (three) times daily.   Yes [provider]  pantoprazole (PROTONIX) 40 MG tablet Take 40 mg by mouth daily.    Yes [provider]  tiZANidine (ZANAFLEX) 4 MG tablet Take 4 mg by mouth every 6 (six) hours as needed.  01/17/15  Yes [provider]  conjugated estrogens (PREMARIN) vaginal cream 1 gram intravaginal BIW Patient not taking: Reported on 10/23/2016 06/26/15   Defrancesco, Prentice Docker, MD  ondansetron (ZOFRAN) 4 MG tablet Take 1 tablet (4 mg total) by mouth every 8 (eight) hours as needed for nausea or vomiting. Patient not taking: Reported on 08/05/2017 10/25/16   Shaune Pollack, MD  oxyCODONE-acetaminophen (ROXICET) 5-325 MG tablet Take 1 tablet by mouth every 6 (six) hours as needed. Patient not taking: Reported on 08/05/2017 02/20/16   Minna Antis, MD      PHYSICAL EXAMINATION:   VITAL SIGNS: Blood pressure (!) 176/82, pulse (!) 104, temperature 98.8 F (37.1 C), temperature source Oral, resp. rate 18, height 5\' 8"  (1.727 m), weight 63.5 kg (140 lb), SpO2 97 %.  GENERAL:  72 y.o.-year-old patient lying in the bed with moderate distress, secondary to left hip pain.  EYES: Pupils equal, round, reactive to light and accommodation. No scleral icterus. Extraocular muscles intact.  HEENT: Head atraumatic, normocephalic. Oropharynx and nasopharynx clear.  NECK:  Supple, no jugular venous distention. No thyroid enlargement, no tenderness.  LUNGS: Normal breath sounds bilaterally, no wheezing, rales,rhonchi or crepitation. No use of accessory muscles of respiration.  CARDIOVASCULAR: S1, S2 normal. No S3/S4.  ABDOMEN: Soft, nontender, nondistended. Bowel sounds present. No  organomegaly or mass.  EXTREMITIES: Left lower extremity is noted externally rotated and shortened.  Range of motion is severely reduced at the left hip joint due to pain. NEUROLOGIC: No focal weakness.  Gait not checked, due to patient being unable to ambulate.  PSYCHIATRIC: The patient is alert and oriented x 3.  SKIN: No obvious rash, lesion, or ulcer.   LABORATORY PANEL:   CBC Recent Labs  Lab 08/05/17 2008  WBC 5.1  HGB 10.6*  HCT 30.9*  PLT 109*  MCV 91.1  MCH 31.3  MCHC 34.3  RDW 14.5  LYMPHSABS 1.2  MONOABS 0.5  EOSABS 0.1  BASOSABS 0.0   ------------------------------------------------------------------------------------------------------------------  Chemistries  Recent Labs  Lab 08/05/17 2008  NA 136  K 4.1  CL 105  CO2 21*  GLUCOSE 104*  BUN 41*  CREATININE 1.33*  CALCIUM 8.7*  AST 28  ALT 15  ALKPHOS 92  BILITOT 0.6   ------------------------------------------------------------------------------------------------------------------ estimated creatinine clearance is 38.9 mL/min (A) (by C-G formula based on SCr of 1.33 mg/dL  (H)). ------------------------------------------------------------------------------------------------------------------ No results for input(s): TSH, T4TOTAL, T3FREE, THYROIDAB in the last 72 hours.  Invalid input(s): FREET3   Coagulation profile No results for input(s): INR, PROTIME in the last 168 hours. ------------------------------------------------------------------------------------------------------------------- No results for input(s): DDIMER in the last 72 hours. -------------------------------------------------------------------------------------------------------------------  Cardiac Enzymes Recent Labs  Lab 08/05/17 2008  TROPONINI <0.03   ------------------------------------------------------------------------------------------------------------------ Invalid input(s): POCBNP  ---------------------------------------------------------------------------------------------------------------  Urinalysis    Component Value Date/Time   COLORURINE YELLOW 10/30/2016 1741   APPEARANCEUR HAZY (A) 10/30/2016 1741   APPEARANCEUR Clear 04/17/2011 2348   LABSPEC 1.020 10/30/2016 1741   LABSPEC 1.010 04/17/2011 2348   PHURINE 5.5 10/30/2016 1741   GLUCOSEU NEGATIVE 10/30/2016 1741   GLUCOSEU Negative 04/17/2011 2348   HGBUR MODERATE (A) 10/30/2016 1741   BILIRUBINUR NEGATIVE 10/30/2016 1741   BILIRUBINUR 3+ 06/26/2015 1204   BILIRUBINUR Negative 04/17/2011 2348   KETONESUR NEGATIVE 10/30/2016 1741   PROTEINUR NEGATIVE 10/30/2016 1741   UROBILINOGEN 0.2 06/26/2015 1204   NITRITE NEGATIVE 10/30/2016 1741   LEUKOCYTESUR NEGATIVE 10/30/2016 1741   LEUKOCYTESUR Negative 04/17/2011 2348     RADIOLOGY: Dg Chest Port 1 View  Result Date: 08/05/2017 CLINICAL DATA:  Larey Seat in store.  Suspect femur fracture. EXAM: PORTABLE CHEST 1 VIEW COMPARISON:  Chest radiograph March 02, 2017 FINDINGS: Cardiac silhouette is mildly enlarged, accentuated by rotation to the LEFT. Stable  chronic interstitial changes with LEFT lung base scarring. No pleural effusion or focal consolidation. No pneumothorax. Osteopenia. IMPRESSION: Mild cardiomegaly and chronic interstitial changes. Electronically Signed   By: Awilda Metro M.D.   On: 08/05/2017 21:07   Dg Hip Unilat With Pelvis 2-3 Views Left  Result Date: 08/05/2017 CLINICAL DATA:  Larey Seat in store.  Suspect hip fracture. EXAM: DG HIP (WITH OR WITHOUT PELVIS) 2-3V LEFT COMPARISON:  None. FINDINGS: Acute nondisplaced LEFT femur femoral neck to greater trochanter. No dislocation. No destructive bony lesions. Humeral heads are located. Osteopenia. Soft tissue planes are non suspicious. Lower lumbar disc prosthesis. IMPRESSION: Acute nondisplaced LEFT femur fracture extending through the greater trochanter. Electronically Signed   By: Awilda Metro M.D.   On: 08/05/2017 21:06    EKG: Orders placed or performed during the hospital encounter of 08/05/17  . ED EKG  . ED EKG  . EKG 12-Lead  . EKG 12-Lead    IMPRESSION AND PLAN:  1.  Left hip fracture, that is post mechanical fall.  Patient is scheduled for surgical repair in the morning.  Will hold any anticoagulants and keep patient n.p.o. Continue pain control. Based on the review of patient's medical problems, physical capacity, and EKG report, she is medically clear, with low risk for postop cardiopulmonary complications. 2.  Chronic pain, due to spine osteoarthritis.  Continue pain control. 3.  Hypertension, stable, restart home medications. 4.  Acute renal failure, likely prerenal, secondary to poor p.o. Intake.  We will start gentle IV hydration and monitor kidney function closely.  Avoid nephrotoxic medications.  All the records are reviewed and case discussed with ED provider. Management plans discussed with the patient, family and they are in agreement.  CODE STATUS: FULL    Code Status Orders  (From admission, onward)        Start     Ordered   08/05/17 2246   Full code  Continuous     08/05/17 2245    Code Status History    Date Active Date Inactive Code Status Order ID Comments User Context   10/24/2016 0110 10/25/2016 1807 Full Code [id number]  Oralia ManisWillis, David, MD Inpatient       TOTAL TIME TAKING CARE OF THIS PATIENT: 45 minutes.    Cammy CopaAngela Desirai Traxler M.D on 08/06/2017 at 12:03 AM  Between 7am to 6pm - Pager - [phone]  After 6pm go to [email or web address] - password [email or web address] [facility]  [facility]  Office  [phone]  CC: Primary care physician; Jaclyn Shaggyate, Denny C, MD

## 2017-08-06 NOTE — Progress Notes (Signed)
Pt will like to have nausea medication with pain meds. MD notified . Order to change zofran to every 4 hours and  Phenergan 12.5 every 6 hours obtained.

## 2017-08-06 NOTE — Progress Notes (Signed)
[facility] - [place] at [facility]   PATIENT NAME: Alison Price    MR#:  [id number]  DATE OF BIRTH:  11/03/1945  SUBJECTIVE:  CHIEF COMPLAINT:  Lt hip pain is ok, awaiting for surgery  REVIEW OF SYSTEMS:  CONSTITUTIONAL: No fever, fatigue or weakness.  EYES: No blurred or double vision.  EARS, NOSE, AND THROAT: No tinnitus or ear pain.  RESPIRATORY: No cough, shortness of breath, wheezing or hemoptysis.  CARDIOVASCULAR: No chest pain, orthopnea, edema.  GASTROINTESTINAL: No nausea, vomiting, diarrhea or abdominal pain.  GENITOURINARY: No dysuria, hematuria.  ENDOCRINE: No polyuria, nocturia,  HEMATOLOGY: No anemia, easy bruising or bleeding SKIN: No rash or lesion. MUSCULOSKELETAL:lt hip pain with movement NEUROLOGIC: No tingling, numbness, weakness.  PSYCHIATRY: No anxiety or depression.   DRUG ALLERGIES:   Allergies  Allergen Reactions  . Codeine Nausea And Vomiting  . Sulfa Antibiotics Other (See Comments)    STEVEN JOHNSON SYNDROME    VITALS:  Blood pressure 111/62, pulse 76, temperature 99.4 F (37.4 C), temperature source Oral, resp. rate 18, height 5\' 8"  (1.727 m), weight 63.5 kg (140 lb), SpO2 100 %.  PHYSICAL EXAMINATION:  GENERAL:  72 y.o.-year-old patient lying in the bed with no acute distress.  EYES: Pupils equal, round, reactive to light and accommodation. No scleral icterus. Extraocular muscles intact.  HEENT: Head atraumatic, normocephalic. Oropharynx and nasopharynx clear.  NECK:  Supple, no jugular venous distention. No thyroid enlargement, no tenderness.  LUNGS: Normal breath sounds bilaterally, no wheezing, rales,rhonchi or crepitation. No use of accessory muscles of respiration.  CARDIOVASCULAR: S1, S2 normal. No murmurs, rubs, or gallops.  ABDOMEN: Soft, nontender, nondistended. Bowel sounds present. No organomegaly or mass.  EXTREMITIES:lt hip is tender No pedal edema, cyanosis, or clubbing.  NEUROLOGIC: Cranial  nerves II through XII are intact. Sensation intact. Gait not checked.  PSYCHIATRIC: The patient is alert and oriented x 3.  SKIN: No obvious rash, lesion, or ulcer.    LABORATORY PANEL:   CBC Recent Labs  Lab 08/06/17 0330 08/06/17 1851  WBC 4.7  --   HGB 10.1* 8.7*  HCT 29.9*  --   PLT 98*  --    ------------------------------------------------------------------------------------------------------------------  Chemistries  Recent Labs  Lab 08/05/17 2008 08/06/17 0330  NA 136 135  K 4.1 4.5  CL 105 106  CO2 21* 21*  GLUCOSE 104* 115*  BUN 41* 34*  CREATININE 1.33* 0.95  CALCIUM 8.7* 8.7*  AST 28  --   ALT 15  --   ALKPHOS 92  --   BILITOT 0.6  --    ------------------------------------------------------------------------------------------------------------------  Cardiac Enzymes Recent Labs  Lab 08/05/17 2008  TROPONINI <0.03   ------------------------------------------------------------------------------------------------------------------  RADIOLOGY:  Dg Chest Port 1 View  Result Date: 08/05/2017 CLINICAL DATA:  Larey Seat in store.  Suspect femur fracture. EXAM: PORTABLE CHEST 1 VIEW COMPARISON:  Chest radiograph March 02, 2017 FINDINGS: Cardiac silhouette is mildly enlarged, accentuated by rotation to the LEFT. Stable chronic interstitial changes with LEFT lung base scarring. No pleural effusion or focal consolidation. No pneumothorax. Osteopenia. IMPRESSION: Mild cardiomegaly and chronic interstitial changes. Electronically Signed   By: Awilda Metro M.D.   On: 08/05/2017 21:07   Dg Hip Operative Unilat W Or W/o Pelvis Left  Result Date: 08/06/2017 CLINICAL DATA:  IM nailing of left hip fracture EXAM: OPERATIVE left HIP (WITH PELVIS IF PERFORMED) 7 VIEWS TECHNIQUE: Fluoroscopic spot image(s) were submitted for interpretation post-operatively. COMPARISON:  Pelvis on left hip films of 08/05/2017 FINDINGS:  Seven C-arm spot films were returned. These images show  the left intertrochanteric femoral fracture. I am nail and fixation screw replaced for fixation. No complicating features are seen. IMPRESSION: IM nailing left intertrochanteric femoral fracture. Electronically Signed   By: Dwyane DeePaul  Barry M.D.   On: 08/06/2017 14:50   Dg Hip Unilat With Pelvis 2-3 Views Left  Result Date: 08/05/2017 CLINICAL DATA:  Larey SeatFell in store.  Suspect hip fracture. EXAM: DG HIP (WITH OR WITHOUT PELVIS) 2-3V LEFT COMPARISON:  None. FINDINGS: Acute nondisplaced LEFT femur femoral neck to greater trochanter. No dislocation. No destructive bony lesions. Humeral heads are located. Osteopenia. Soft tissue planes are non suspicious. Lower lumbar disc prosthesis. IMPRESSION: Acute nondisplaced LEFT femur fracture extending through the greater trochanter. Electronically Signed   By: Awilda Metroourtnay  Bloomer M.D.   On: 08/05/2017 21:06    EKG:   Orders placed or performed during the hospital encounter of 08/05/17  . ED EKG  . ED EKG  . EKG 12-Lead  . EKG 12-Lead    ASSESSMENT AND PLAN:    1.  Left hip fracture, that is post mechanical fall.   Patient is scheduled for surgical repair today.   Will hold any anticoagulants and keep patient n.p.o.  Continue pain control prn   with low risk for postop cardiopulmonary complications.  2.  Chronic pain, due to spine osteoarthritis.  Continue pain control.  3.  Hypertension, bp is soft, hold meds   4.  Acute renal failure, likely prerenal, secondary to poor p.o. Intake.  gentle IV hydration and monitor kidney function closely.  Avoid nephrotoxic medications      All the records are reviewed and case discussed with Care Management/Social Workerr. Management plans discussed with the patient, family and they are in agreement.  CODE STATUS: fc  TOTAL TIME TAKING CARE OF THIS PATIENT: 36minutes.   POSSIBLE D/C IN 2 DAYS, DEPENDING ON CLINICAL CONDITION.  Note: This dictation was prepared with Dragon dictation along with smaller phrase  technology. Any transcriptional errors that result from this process are unintentional.   Ramonita LabAruna Cathe Bilger M.D on 08/06/2017 at 8:37 PM  Between 7am to 6pm - Pager - [phone] After 6pm go to [email or web address] - password [email or web address] [facility]  [facility]  Office  [phone]  CC: Primary care physician; Jaclyn Shaggyate, Denny C, MD

## 2017-08-06 NOTE — Progress Notes (Signed)
Reinforced dsg  Blood almost through dsg to middle

## 2017-08-06 NOTE — Anesthesia Post-op Follow-up Note (Signed)
Anesthesia QCDR form completed.        

## 2017-08-06 NOTE — Anesthesia Preprocedure Evaluation (Signed)
Anesthesia Evaluation  Patient identified by MRN, date of birth, ID band Patient awake    Reviewed: Allergy & Precautions, H&P , NPO status , Patient's Chart, lab work & pertinent test results  History of Anesthesia Complications Negative for: history of anesthetic complications  Airway Mallampati: II  TM Distance: >3 FB Neck ROM: limited    Dental  (+) Chipped, Poor Dentition, Missing, Edentulous Lower   Pulmonary neg shortness of breath,           Cardiovascular Exercise Tolerance: Good hypertension, (-) angina(-) Past MI and (-) DOE negative cardio ROS       Neuro/Psych negative neurological ROS  negative psych ROS   GI/Hepatic negative GI ROS, Neg liver ROS, neg GERD  ,  Endo/Other  negative endocrine ROS  Renal/GU      Musculoskeletal  (+) Arthritis ,   Abdominal   Peds  Hematology  (+) Blood dyscrasia, ,   Anesthesia Other Findings Past Medical History: No date: Anemia No date: Arthritis No date: Hypertension No date: Osteoporosis No date: Stevens-Johnson syndrome (HCC)  Past Surgical History: No date: ABDOMINAL HYSTERECTOMY     Comment:  partial No date: BACK SURGERY No date: FOOT SURGERY No date: HAND SURGERY     Comment:  THUMB REPAIR No date: HEMORRHOID SURGERY No date: POSTERIOR REPAIR  BMI    Body Mass Index:  21.29 kg/m      Reproductive/Obstetrics negative OB ROS                             Anesthesia Physical Anesthesia Plan  ASA: III  Anesthesia Plan: General ETT   Post-op Pain Management:    Induction: Intravenous  PONV Risk Score and Plan: Ondansetron, Dexamethasone, Midazolam and Treatment may vary due to age or medical condition  Airway Management Planned: Oral ETT  Additional Equipment:   Intra-op Plan:   Post-operative Plan: Extubation in OR  Informed Consent: I have reviewed the patients History and Physical, chart, labs and  discussed the procedure including the risks, benefits and alternatives for the proposed anesthesia with the patient or authorized representative who has indicated his/her understanding and acceptance.   Dental Advisory Given  Plan Discussed with: Anesthesiologist, CRNA and Surgeon  Anesthesia Plan Comments: (Patient has history of back surgery and platelets are less than 100 K, patient elects for GA over spinal    Patient consented for risks of anesthesia including but not limited to:  - adverse reactions to medications - damage to teeth, lips or other oral mucosa - sore throat or hoarseness - Damage to heart, brain, lungs or loss of life  Patient voiced understanding.)        Anesthesia Quick Evaluation

## 2017-08-07 ENCOUNTER — Inpatient Hospital Stay: Payer: 59

## 2017-08-07 ENCOUNTER — Encounter: Payer: Self-pay | Admitting: Orthopedic Surgery

## 2017-08-07 ENCOUNTER — Encounter
Admission: RE | Admit: 2017-08-07 | Discharge: 2017-08-07 | Disposition: A | Discharge status: Home / Self Care | Payer: 59 | Source: Ambulatory Visit | Attending: Internal Medicine | Admitting: Internal Medicine

## 2017-08-07 DIAGNOSIS — R110 Nausea: Secondary | ICD-10-CM

## 2017-08-07 DIAGNOSIS — R4182 Altered mental status, unspecified: Secondary | ICD-10-CM

## 2017-08-07 DIAGNOSIS — I10 Essential (primary) hypertension: Secondary | ICD-10-CM

## 2017-08-07 DIAGNOSIS — D649 Anemia, unspecified: Secondary | ICD-10-CM

## 2017-08-07 LAB — COMPREHENSIVE METABOLIC PANEL
ALBUMIN: 3.1 g/dL — AB (ref 3.5–5.0)
ALK PHOS: 64 U/L (ref 38–126)
ALT: 11 U/L — ABNORMAL LOW (ref 14–54)
AST: 23 U/L (ref 15–41)
Anion gap: 7 (ref 5–15)
BUN: 30 mg/dL — ABNORMAL HIGH (ref 6–20)
CHLORIDE: 105 mmol/L (ref 101–111)
CO2: 23 mmol/L (ref 22–32)
Calcium: 8.1 mg/dL — ABNORMAL LOW (ref 8.9–10.3)
Creatinine, Ser: 0.87 mg/dL (ref 0.44–1.00)
GFR calc non Af Amer: >60 mL/min (ref >60)
GLUCOSE: 143 mg/dL — AB (ref 65–99)
Potassium: 4.6 mmol/L (ref 3.5–5.1)
SODIUM: 135 mmol/L (ref 135–145)
Total Bilirubin: 0.5 mg/dL (ref 0.3–1.2)
Total Protein: 5.7 g/dL — ABNORMAL LOW (ref 6.5–8.1)

## 2017-08-07 LAB — CBC
HCT: 22.4 % — ABNORMAL LOW (ref 35.0–47.0)
HEMATOCRIT: 21.2 % — AB (ref 35.0–47.0)
HEMOGLOBIN: 7.4 g/dL — AB (ref 12.0–16.0)
Hemoglobin: 7.9 g/dL — ABNORMAL LOW (ref 12.0–16.0)
MCH: 31.9 pg (ref 26.0–34.0)
MCH: 31.6 pg (ref 26.0–34.0)
MCHC: 34.7 g/dL (ref 32.0–36.0)
MCHC: 35.4 g/dL (ref 32.0–36.0)
MCV: 91.9 fL (ref 80.0–100.0)
MCV: 89.2 fL (ref 80.0–100.0)
PLATELETS: 113 10*3/uL — AB (ref 150–440)
Platelets: 114 10*3/uL — ABNORMAL LOW (ref 150–440)
RBC: 2.31 MIL/uL — ABNORMAL LOW (ref 3.80–5.20)
RBC: 2.51 MIL/uL — ABNORMAL LOW (ref 3.80–5.20)
RDW: 14 % (ref 11.5–14.5)
RDW: 14.3 % (ref 11.5–14.5)
WBC: 8.7 10*3/uL (ref 3.6–11.0)
WBC: 7.6 10*3/uL (ref 3.6–11.0)

## 2017-08-07 LAB — BASIC METABOLIC PANEL
ANION GAP: 13 (ref 5–15)
BUN: 26 mg/dL — AB (ref 6–20)
CALCIUM: 8.4 mg/dL — AB (ref 8.9–10.3)
CO2: 20 mmol/L — ABNORMAL LOW (ref 22–32)
Chloride: 105 mmol/L (ref 101–111)
Creatinine, Ser: 0.62 mg/dL (ref 0.44–1.00)
GFR calc Af Amer: >60 mL/min (ref >60)
GLUCOSE: 139 mg/dL — AB (ref 65–99)
Potassium: 3.8 mmol/L (ref 3.5–5.1)
SODIUM: 138 mmol/L (ref 135–145)

## 2017-08-07 LAB — GLUCOSE, CAPILLARY: Glucose-Capillary: 137 mg/dL — ABNORMAL HIGH (ref 65–99)

## 2017-08-07 LAB — PREPARE RBC (CROSSMATCH)

## 2017-08-07 LAB — TROPONIN I: TROPONIN I: 0.08 ng/mL — AB (ref <0.03)

## 2017-08-07 LAB — ABO/RH: ABO/RH(D): A POS

## 2017-08-07 MED ORDER — METOCLOPRAMIDE HCL 5 MG/ML IJ SOLN
10.0000 mg | Freq: Once | INTRAMUSCULAR | Status: AC
  Administered 2017-08-07: 10 mg via INTRAVENOUS
  Filled 2017-08-07: qty 2

## 2017-08-07 MED ORDER — ASPIRIN EC 81 MG PO TBEC
81.0000 mg | DELAYED_RELEASE_TABLET | Freq: Every day | ORAL | Status: DC
  Administered 2017-08-08 – 2017-08-10 (×3): 81 mg via ORAL
  Filled 2017-08-07 (×3): qty 1

## 2017-08-07 MED ORDER — FENTANYL CITRATE (PF) 100 MCG/2ML IJ SOLN
12.5000 ug | Freq: Once | INTRAMUSCULAR | Status: AC
  Administered 2017-08-07: 12.5 ug via INTRAVENOUS
  Filled 2017-08-07: qty 2

## 2017-08-07 MED ORDER — LORAZEPAM 0.5 MG PO TABS
0.5000 mg | ORAL_TABLET | Freq: Two times a day (BID) | ORAL | Status: DC | PRN
  Administered 2017-08-07: 0.5 mg via ORAL
  Filled 2017-08-07: qty 1

## 2017-08-07 MED ORDER — SODIUM CHLORIDE 0.9% IV SOLUTION: Freq: Once | INTRAVENOUS | Status: DC

## 2017-08-07 MED ORDER — FENTANYL CITRATE (PF) 100 MCG/2ML IJ SOLN
12.5000 ug | INTRAMUSCULAR | Status: DC
  Administered 2017-08-07 – 2017-08-08 (×3): 12.5 ug via INTRAVENOUS
  Filled 2017-08-07 (×3): qty 2

## 2017-08-07 MED ORDER — ONDANSETRON HCL 4 MG/2ML IJ SOLN
4.0000 mg | Freq: Once | INTRAMUSCULAR | Status: AC
  Administered 2017-08-07: 4 mg via INTRAVENOUS
  Filled 2017-08-07: qty 2

## 2017-08-07 MED ORDER — HYDRALAZINE HCL 20 MG/ML IJ SOLN
5.0000 mg | INTRAMUSCULAR | Status: DC | PRN
  Administered 2017-08-08: 5 mg via INTRAVENOUS
  Filled 2017-08-07: qty 1

## 2017-08-07 NOTE — Progress Notes (Signed)
Pt is disoriented to situation and is lethargic, a change from baseline. Pt is nauseous and dry heaving, has been vomiting multiple times today with little to no relief from anti-emetics. Pt appears symmetrical. BP elevated. MD notified. RN called 1C RN to assess for code stroke. Will continue to monitor.

## 2017-08-07 NOTE — Progress Notes (Signed)
Patient resting in bed. Dressing dry and intact on left hip. No complaints. Call bell in reach. Continue to monitor.

## 2017-08-07 NOTE — Progress Notes (Signed)
OT Cancellation Note  Patient Details Name: Ula LingoConstance A Cloyd MRN: [id number] DOB: 1945-08-24   Cancelled Treatment:    Reason Eval/Treat Not Completed: Pain limiting ability to participate;Other (comment). Order received, chart reviewed. Pt continues to report significant pain (10/10 currently), nausea, and vomiting "every few minutes". Will hold this date and re-attempt OT evaluation next date as pt is medically appropriate.   Richrd PrimeJamie Stiller, MPH, MS, OTR/L ascom [phone] 08/07/17, 1:56 PM

## 2017-08-07 NOTE — Progress Notes (Signed)
Clinical Child psychotherapistocial Worker (CSW) presented bed offers to patient. [facility]. [facility].   B[employer] Hughes IncorporatedBailey Thoren Hosang, LCSW [phone]

## 2017-08-07 NOTE — Progress Notes (Signed)
   Subjective: 1 Day Post-Op Procedure(s) (LRB): INTRAMEDULLARY (IM) NAIL INTERTROCHANTRIC (Left) Patient reports pain as 10 on 0-10 scale.  Currently being rolled and changed. Patient is having severe pain, noted to have low blood pressure. Denies any CP, SOB, ABD pain. We will continue therapy today.  Plan is to go Skilled nursing facility after hospital stay.  Objective: Vital signs in last 24 hours: Temp:  [97.7 F (36.5 C)-99.4 F (37.4 C)] 97.9 F (36.6 C) (06/21 0751) Pulse Rate:  [65-102] 102 (06/21 0751) Resp:  [14-24] 16 (06/21 0529) BP: (77-156)/(40-93) 149/70 (06/21 0751) SpO2:  [93 %-100 %] 94 % (06/21 0751) Weight:  [63.5 kg (140 lb)-66.7 kg (147 lb)] 66.7 kg (147 lb) (06/21 0529)  Intake/Output from previous day: 06/20 0701 - 06/21 0700 In: 3477.9 [I.V.:3361.3; IV Piggyback:116.7] Out: 1620 [Urine:1520; Blood:100] Intake/Output this shift: No intake/output data recorded.  Recent Labs    08/05/17 2008 08/06/17 0330 08/06/17 1851 08/07/17 0304  HGB 10.6* 10.1* 8.7* 7.4*   Recent Labs    08/06/17 0330 08/07/17 0304  WBC 4.7 8.7  RBC 3.27* 2.31*  HCT 29.9* 21.2*  PLT 98* 114*   Recent Labs    08/06/17 0330 08/07/17 0304  NA 135 135  K 4.5 4.6  CL 106 105  CO2 21* 23  BUN 34* 30*  CREATININE 0.95 0.87  GLUCOSE 115* 143*  CALCIUM 8.7* 8.1*   Recent Labs    08/06/17 0951  INR 1.10    EXAM General - Patient is Alert, Appropriate and Oriented Extremity - Neurovascular intact Sensation intact distally Intact pulses distally Dorsiflexion/Plantar flexion intact No cellulitis present Compartment soft Dressing - dressing C/D/I and no drainage Motor Function - intact, moving foot and toes well on exam.   Past Medical History:  Diagnosis Date  . Anemia   . Arthritis   . Hypertension   . Osteoporosis   . Stevens-Johnson syndrome (HCC)     Assessment/Plan:   1 Day Post-Op Procedure(s) (LRB): INTRAMEDULLARY (IM) NAIL INTERTROCHANTRIC  (Left) Active Problems:   Hip fx (HCC) Acute post op blood loss anemia   Estimated body mass index is 22.35 kg/m as calculated from the following:   Height as of this encounter: 5\' 8"  (1.727 m).   Weight as of this encounter: 66.7 kg (147 lb). Advance diet Up with therapy Acute post op blood loss anemia -hemoglobin 7.4.  Start iron supplementation.  Recheck hemoglobin in the morning and if continuing to drop, recommend transfusing 1 unit of packed red blood cells. Recommend holding blood pressure medications  Avoid high-dose narcotics to limit dropping blood pressure Recheck labs in the morning Needs bowel movement   DVT Prophylaxis - Lovenox, Foot Pumps and TED hose Weight-Bearing as tolerated to left leg   T. Cranston Neighborhris Gaines, PA-C [facility] 08/07/2017, 7:52 AM

## 2017-08-07 NOTE — Progress Notes (Signed)
   08/07/17 1658  Clinical Encounter Type  Visited With Family  Visit Type Initial   Chaplain paged for Code Stroke.  Arrived to find staff busy in the room providing care to the patient.  Chaplain introduced self to grandson and two friends of the family in the hall.  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  Nurse provided update to family that patient was awake and answering questions and that they would be running some tests.  Family states they have no other needs at this time.  Encouraged family to page on-call chaplain if needed.

## 2017-08-07 NOTE — Progress Notes (Signed)
Called to room by Irving BurtonEmily, RN to consult regarding need for Code Stroke. Patient appears lethargic with altered mental status. Patient able to follow commands. All movements are symmetrical. Per RN patient has been vomiting all shift with no improvement with antiemetics. Patient complaining of headache in addition.   Rapid Response called. STAT CBC and BMP ordered per protocol. STAT CT Head ordered per verbal order from Dr. Amado CoeGouru.   Bo McclintockBrewer,Kade Demicco S, RN 08/07/17  5:13 PM

## 2017-08-07 NOTE — Progress Notes (Signed)
[facility] [place] [facility]   PATIENT NAME: [place] [facility]    MR#:  [id number]  DATE OF BIRTH:  1945/06/01  SUBJECTIVE:  CHIEF COMPLAINT:  Lt hip pain is ok, had surgery 6/20. Nauseous and anxious today, c/o hip pain 10 /10   REVIEW OF SYSTEMS:  CONSTITUTIONAL: No fever, fatigue or weakness.  EYES: No blurred or double vision.  EARS, NOSE, AND THROAT: No tinnitus or ear pain.  RESPIRATORY: No cough, shortness of breath, wheezing or hemoptysis.  CARDIOVASCULAR: No chest pain, orthopnea, edema.  GASTROINTESTINAL: nausea,  No vomiting, diarrhea or abdominal pain.  GENITOURINARY: No dysuria, hematuria.  ENDOCRINE: No polyuria, nocturia,  HEMATOLOGY: No anemia, easy bruising or bleeding SKIN: No rash or lesion. MUSCULOSKELETAL:lt hip pain with movement NEUROLOGIC: No tingling, numbness, weakness.  PSYCHIATRY: Anxious, denies  depression.   DRUG ALLERGIES:   Allergies  Allergen Reactions  . Codeine Nausea And Vomiting  . Sulfa Antibiotics Other (See Comments)    STEVEN JOHNSON SYNDROME    VITALS:  Blood pressure (!) 188/88, pulse 86, temperature 98.3 F (36.8 C), temperature source Oral, resp. rate 16, height 5\' 8"  (1.727 m), weight 66.7 kg (147 lb), SpO2 100 %.  PHYSICAL EXAMINATION:  GENERAL:  72 y.o.-year-old patient lying in the bed with no acute distress.  EYES: Pupils equal, round, reactive to light and accommodation. No scleral icterus. Extraocular muscles intact.  HEENT: Head atraumatic, normocephalic. Oropharynx and nasopharynx clear.  NECK:  Supple, no jugular venous distention. No thyroid enlargement, no tenderness.  LUNGS: Normal breath sounds bilaterally, no wheezing, rales,rhonchi or crepitation. No use of accessory muscles of respiration.  CARDIOVASCULAR: S1, S2 normal. No murmurs, rubs, or gallops.  ABDOMEN: Soft, nontender, nondistended. Bowel sounds present. No organomegaly or mass.  EXTREMITIES:lt hip is tender No pedal  edema, cyanosis, or clubbing.  NEUROLOGIC: Cranial nerves II through XII are intact. Sensation intact. Gait not checked.  PSYCHIATRIC: The patient is alert and oriented x 3.  SKIN: No obvious rash, lesion, or ulcer.    LABORATORY PANEL:   CBC Recent Labs  Lab 08/07/17 0304  WBC 8.7  HGB 7.4*  HCT 21.2*  PLT 114*   ------------------------------------------------------------------------------------------------------------------  Chemistries  Recent Labs  Lab 08/07/17 0304  NA 135  K 4.6  CL 105  CO2 23  GLUCOSE 143*  BUN 30*  CREATININE 0.87  CALCIUM 8.1*  AST 23  ALT 11*  ALKPHOS 64  BILITOT 0.5   ------------------------------------------------------------------------------------------------------------------  Cardiac Enzymes Recent Labs  Lab 08/05/17 2008  TROPONINI <0.03   ------------------------------------------------------------------------------------------------------------------  RADIOLOGY:  Dg Chest Port 1 View  Result Date: 08/05/2017 CLINICAL DATA:  Larey SeatFell in store.  Suspect femur fracture. EXAM: PORTABLE CHEST 1 VIEW COMPARISON:  Chest radiograph March 02, 2017 FINDINGS: Cardiac silhouette is mildly enlarged, accentuated by rotation to the LEFT. Stable chronic interstitial changes with LEFT lung base scarring. No pleural effusion or focal consolidation. No pneumothorax. Osteopenia. IMPRESSION: Mild cardiomegaly and chronic interstitial changes. Electronically Signed   By: Awilda Metroourtnay  Bloomer M.D.   On: 08/05/2017 21:07   Dg Hip Operative Unilat W Or W/o Pelvis Left  Result Date: 08/06/2017 CLINICAL DATA:  IM nailing of left hip fracture EXAM: OPERATIVE left HIP (WITH PELVIS IF PERFORMED) 7 VIEWS TECHNIQUE: Fluoroscopic spot image(s) were submitted for interpretation post-operatively. COMPARISON:  Pelvis on left hip films of 08/05/2017 FINDINGS: Seven C-arm spot films were returned. These images show the left intertrochanteric femoral fracture. I am  nail and fixation screw replaced  for fixation. No complicating features are seen. IMPRESSION: IM nailing left intertrochanteric femoral fracture. Electronically Signed   By: Dwyane Dee M.D.   On: 08/06/2017 14:50   Dg Hip Unilat With Pelvis 2-3 Views Left  Result Date: 08/05/2017 CLINICAL DATA:  Larey Seat in store.  Suspect hip fracture. EXAM: DG HIP (WITH OR WITHOUT PELVIS) 2-3V LEFT COMPARISON:  None. FINDINGS: Acute nondisplaced LEFT femur femoral neck to greater trochanter. No dislocation. No destructive bony lesions. Humeral heads are located. Osteopenia. Soft tissue planes are non suspicious. Lower lumbar disc prosthesis. IMPRESSION: Acute nondisplaced LEFT femur fracture extending through the greater trochanter. Electronically Signed   By: Awilda Metro M.D.   On: 08/05/2017 21:06    EKG:   Orders placed or performed during the hospital encounter of 08/05/17  . ED EKG  . ED EKG  . EKG 12-Lead  . EKG 12-Lead    ASSESSMENT AND PLAN:    1.  Left hip fracture, that is post mechanical fall.   Patient had surgery 08/06/17.   Continue pain control prn  Post op care per ortho  with low risk for postop cardiopulmonary complications. No bm yet  2.  Chronic pain, due to spine osteoarthritis.  Continue pain control.  3.  Hypertension, bp is elevated , resume  meds amlodipine and lisinopril titrate prn   4.  Acute renal failure, likely prerenal, secondary to poor p.o. Intake. resolved with  gentle IV hydration and monitor kidney function closely.  Avoid nephrotoxic medications  5.  Acute blood loss anemia hb dropped down to 7.4 postoperatively.  We will start iron supplements and repeat CBC in a.m. transfuse 1 unit of blood if hemoglobin is low at 7.0  Disposition SNF awaiting authorization,  All the records are reviewed and case discussed with Care Management/Social Workerr. Management plans discussed with the patient, family and they are in agreement.  CODE STATUS: fc  TOTAL TIME  TAKING CARE OF THIS PATIENT: .   POSSIBLE D/C IN 2 DAYS, DEPENDING ON CLINICAL CONDITION.  Note: This dictation was prepared with Dragon dictation along with smaller phrase technology. Any transcriptional errors that result from this process are unintentional.   Ramonita Lab M.D on 08/07/2017 at 2:04 PM  Between 7am to 6pm - Pager - [phone] After 6pm go to [email or web address] - password [email or web address] [facility]  [place] [facility]  Office  [phone]  CC: Primary care physician; Jaclyn Shaggy, MD

## 2017-08-07 NOTE — Progress Notes (Signed)
Responded to rapid response page. Haynes BastJamie Smith RN Intensive Care also responded.Patient's RN and other RNs present. Initially patient awake, oriented to person place time. No s/s of stroke. Vital signs reassessed. BP 190/82. Patient states she just doesn't feel right and feels like something is going to happen to her.Patient transported to CT on bed, vomited on way there. Airway remained intact, patient awake.Taken to ICU 12 after CT.

## 2017-08-07 NOTE — Consult Note (Signed)
[facility] Pulmonary Medicine Consultation      Name: Alison HAINSWORTH MRN: [id number] DOB: [birth year]    ADMISSION DATE:  08/05/2017 CONSULTATION DATE:  6/21/20019  REFERRING MD : Ramonita Lab, MD    CHIEF COMPLAINT:    Altered Mental Status   HISTORY OF PRESENT ILLNESS   Alison Price  is a 72 y.o. female with a known history of chronic pain, hypertension and anemia. Patient was brought to emergency room via EMS status post mechanical fall while she was at the [employer], few hours ago.  She landed on her left side and immediately started to complain of severe left hip pain.  She has been unable to get up or bear weight on the left leg, since the fall.  Patient continues to complain of severe pain and left hip joint worse with movement.  She denies losing her consciousness or hitting her head.  Fentanyl IV helped very little with her pain. She denies any chest pain or shortness of breath with exertion.  No history of heart disease.  She is usually able to walk more than 100 feet without any chest pain or shortness of breath.  Her physical activity is limited though by her chronic pain. Blood test done in emergency room revealed the elevated creatinine level of 1.33.  Hemoglobin level is low at 10.6.  Platelet count is 109. Left hip x-ray, reviewed by myself, shows acute nondisplaced LEFT femur fracture extending through the greater trochanter.   Patient is POD # 1 of Intramedullary nail of left  intertrochanteric  Hip.  Patient complained of severe nausea most of the day and was treated with multiple meds.  She was found to have decrease in MS and therefore went for stat CT head then transfer to ICU for evaluation and management.  She arrived in the ICU hypertensive with BP of 190/92 and with complaints of the need to void.    SIGNIFICANT EVENTS   6/19 admitted to hospital after fall 6/20 Nail of left hip 6/21 transferred to ICU because of MS change    PAST MEDICAL  HISTORY    :  Past Medical History:  Diagnosis Date  . Anemia   . Arthritis   . Hypertension   . Osteoporosis   . Stevens-Johnson syndrome [facility])    Past Surgical History:  Procedure Laterality Date  . ABDOMINAL HYSTERECTOMY     partial  . BACK SURGERY    . FOOT SURGERY    . HAND SURGERY     THUMB REPAIR  . HEMORRHOID SURGERY    . INTRAMEDULLARY (IM) NAIL INTERTROCHANTERIC Left 08/06/2017   Procedure: INTRAMEDULLARY (IM) NAIL INTERTROCHANTRIC;  Surgeon: Signa Kell, MD;  Location: [facility] ORS;  Service: Orthopedics;  Laterality: Left;  . POSTERIOR REPAIR     Prior to Admission medications   Medication Sig Start Date End Date Taking? Authorizing Provider  albuterol-ipratropium (COMBIVENT) 18-103 MCG/ACT inhaler Inhale 2 puffs into the lungs every 6 (six) hours as needed for wheezing or shortness of breath.   Yes [provider]  amLODipine (NORVASC) 10 MG tablet Take 10 mg by mouth daily.  04/09/15  Yes [provider]  aspirin 81 MG tablet Take 81 mg by mouth daily.   Yes [provider]  butalbital-acetaminophen-caffeine (FIORICET, ESGIC) 50-325-40 MG tablet Take 1 tablet by mouth every 6 (six) hours as needed for headache or migraine.   Yes [provider]  Choline Fenofibrate (FENOFIBRIC ACID) 135 MG CPDR Take 135 mg by mouth daily.  04/09/15  Yes [provider]  etodolac (LODINE) 400 MG tablet Take 400 mg by mouth 2 (two) times daily.   Yes [provider]  Ferrous Sulfate (SLOW FE) 142 (45 Fe) MG TBCR Take 1 tablet by mouth daily.   Yes [provider]  fexofenadine (ALLEGRA) 180 MG tablet Take 180 mg by mouth daily.   Yes [provider]  Fluticasone-Salmeterol (ADVAIR) 250-50 MCG/DOSE AEPB Inhale 1 puff into the lungs 2 (two) times daily.   Yes [provider]  lisinopril (PRINIVIL,ZESTRIL) 20 MG tablet Take 20 mg by mouth daily.   Yes [provider]  meclizine (ANTIVERT) 25 MG tablet Take  25 mg by mouth 2 (two) times daily as needed for dizziness.   Yes [provider]  morphine (MS CONTIN) 60 MG 12 hr tablet Take 60 mg by mouth 3 (three) times daily.  03/15/15  Yes [provider]  Multiple Vitamin (MULTIVITAMIN) tablet Take 1 tablet by mouth daily.   Yes [provider]  Oxycodone HCl 10 MG TABS Take 10 mg by mouth 3 (three) times daily.   Yes [provider]  pantoprazole (PROTONIX) 40 MG tablet Take 40 mg by mouth daily.    Yes [provider]  tiZANidine (ZANAFLEX) 4 MG tablet Take 4 mg by mouth every 6 (six) hours as needed.  01/17/15  Yes [provider]  conjugated estrogens (PREMARIN) vaginal cream 1 gram intravaginal BIW Patient not taking: Reported on 10/23/2016 06/26/15   Defrancesco, Prentice Docker, MD  ondansetron (ZOFRAN) 4 MG tablet Take 1 tablet (4 mg total) by mouth every 8 (eight) hours as needed for nausea or vomiting. Patient not taking: Reported on 08/05/2017 10/25/16   Shaune Pollack, MD  oxyCODONE-acetaminophen (ROXICET) 5-325 MG tablet Take 1 tablet by mouth every 6 (six) hours as needed. Patient not taking: Reported on 08/05/2017 02/20/16   Minna Antis, MD   Allergies  Allergen Reactions  . Codeine Nausea And Vomiting  . Sulfa Antibiotics Other (See Comments)    STEVEN JOHNSON SYNDROME     FAMILY HISTORY   Family History  Problem Relation Age of Onset  . Diabetes Paternal Grandmother   . Uterine cancer Maternal Aunt   . Stomach cancer Maternal Aunt   . Breast cancer Neg Hx   . Ovarian cancer Neg Hx   . Colon cancer Neg Hx   . Heart disease Neg Hx       SOCIAL HISTORY    reports that she has never smoked. She has never used smokeless tobacco. She reports that she does not drink alcohol or use drugs.  Review of Systems  Constitutional: Negative for chills and fever.  HENT: Negative for congestion, ear discharge, ear pain, hearing loss, nosebleeds and tinnitus.   Eyes: Negative.   Respiratory:  Negative for stridor.   Cardiovascular: Negative for chest pain, palpitations and orthopnea.  Gastrointestinal: Negative for abdominal pain, constipation, diarrhea, heartburn, nausea and vomiting.  Genitourinary:       Urinary retension  Musculoskeletal: Negative.   Skin: Negative.   Neurological: Negative for dizziness, tremors, sensory change, speech change, focal weakness, seizures, loss of consciousness, weakness and headaches.  Endo/Heme/Allergies: Negative.   Psychiatric/Behavioral: Negative.       VITAL SIGNS    Temp:  [97.9 F (36.6 C)-99.5 F (37.5 C)] 99.5 F (37.5 C) (06/21 1800) Pulse Rate:  [72-102] 88 (06/21 1900) Resp:  [16-20] 20 (06/21 1900) BP: (94-190)/(50-92) 154/90 (06/21 1900) SpO2:  [94 %-100 %]  100 % (06/21 1900) Weight:  [147 lb (66.7 kg)] 147 lb (66.7 kg) (06/21 0529) HEMODYNAMICS:  no compromise  OXYGEN:  2 litres [place] INTAKE / OUTPUT:  Intake/Output Summary (Last 24 hours) at 08/07/2017 1937 Last data filed at 08/07/2017 1600 Gross per 24 hour  Intake 1351.67 ml  Output 1350 ml  Net 1.67 ml       PHYSICAL EXAM    GENERAL:  comfortable in bed without distress, speaking in clear sentenses EYES: Pupils equal, round, reactive to light and accommodation. No scleral icterus. Extraocular muscles intact.  HEENT: Head atraumatic, normocephalic. Oropharynx and nasopharynx clear.  NECK:  Supple, no jugular venous distention. No thyroid enlargement, no tenderness.  LUNGS: Normal breath sounds bilaterally, no wheezing, rales,rhonchi or crepitation. No use of accessory muscles of respiration.  CARDIOVASCULAR: S1, S2 normal. No murmurs, rubs, or gallops.  ABDOMEN: Soft, nontender, nondistended. Bowel sounds present. No organomegaly or mass.  EXTREMITIES:lt hip is tender No pedal edema, cyanosis, or clubbing.  NEUROLOGIC: Cranial nerves II through XII are intact. Sensation intact. Gait not checked.  PSYCHIATRIC: The patient is alert and oriented x 3.    SKIN: No obvious rash, lesion, or ulcer. Left operative site clear of ecchymosis     LABS   LABS:  CBC Recent Labs  Lab 08/06/17 0330 08/06/17 1851 08/07/17 0304 08/07/17 1852  WBC 4.7  --  8.7 7.6  HGB 10.1* 8.7* 7.4* 7.9*  HCT 29.9*  --  21.2* 22.4*  PLT 98*  --  114* 113*   Coag's Recent Labs  Lab 08/06/17 0951  INR 1.10   BMET Recent Labs  Lab 08/05/17 2008 08/06/17 0330 08/07/17 0304  NA 136 135 135  K 4.1 4.5 4.6  CL 105 106 105  CO2 21* 21* 23  BUN 41* 34* 30*  CREATININE 1.33* 0.95 0.87  GLUCOSE 104* 115* 143*   Electrolytes Recent Labs  Lab 08/05/17 2008 08/06/17 0330 08/07/17 0304  CALCIUM 8.7* 8.7* 8.1*   Sepsis Markers No results for input(s): LATICACIDVEN, PROCALCITON, O2SATVEN in the last 168 hours. ABG No results for input(s): PHART, PCO2ART, PO2ART in the last 168 hours. Liver Enzymes Recent Labs  Lab 08/05/17 2008 08/07/17 0304  AST 28 23  ALT 15 11*  ALKPHOS 92 64  BILITOT 0.6 0.5  ALBUMIN 4.5 3.1*   Cardiac Enzymes Recent Labs  Lab 08/05/17 2008  TROPONINI <0.03   Glucose Recent Labs  Lab 08/07/17 1703  GLUCAP 137*     Recent Results (from the past 240 hour(s))  MRSA PCR Screening     Status: None   Collection Time: 08/05/17 11:00 PM  Result Value Ref Range Status   MRSA by PCR NEGATIVE NEGATIVE Final    Comment:        The GeneXpert MRSA Assay (FDA approved for NASAL specimens only), is one component of a comprehensive MRSA colonization surveillance program. It is not intended to diagnose MRSA infection nor to guide or monitor treatment for MRSA infections. Performed at [facility], [address] [place], [place] [address]      Current Facility-Administered Medications:  .  0.9 %  sodium chloride infusion (Manually program via Guardrails IV Fluids), , Intravenous, Once, Gouru, Aruna, MD .  0.9 %  sodium chloride infusion, , Intravenous, Continuous, Signa KellPatel, Sunny, MD, Last Rate: 75  mL/hr at 08/05/17 2311 .  0.9 %  sodium chloride infusion, , Intravenous, Continuous, Signa KellPatel, Sunny, MD, Last Rate: 75 mL/hr at 08/06/17 1711 .  acetaminophen (TYLENOL) tablet 1,000 mg, 1,000 mg, Oral, Q8H, Signa Kell, MD, 1,000 mg at 08/07/17 0517 .  amLODipine (NORVASC) tablet 10 mg, 10 mg, Oral, Daily, Signa Kell, MD, Stopped at 08/07/17 [phone] .  aspirin EC tablet 81 mg, 81 mg, Oral, Daily, Gouru, Aruna, MD .  bisacodyl (DULCOLAX) suppository 10 mg, 10 mg, Rectal, Daily PRN, Signa Kell, MD .  butalbital-acetaminophen-caffeine (FIORICET, ESGIC) [phone] MG per tablet 1 tablet, 1 tablet, Oral, Q6H PRN, Signa Kell, MD .  cyclobenzaprine (FLEXERIL) tablet 5 mg, 5 mg, Oral, TID PRN, Signa Kell, MD, 5 mg at 08/06/17 0335 .  docusate sodium (COLACE) capsule 100 mg, 100 mg, Oral, BID, Signa Kell, MD, Stopped at 08/06/17 2200 .  enoxaparin (LOVENOX) injection 40 mg, 40 mg, Subcutaneous, Q24H, Signa Kell, MD, 40 mg at 08/07/17 0802 .  fenofibrate tablet 160 mg, 160 mg, Oral, Daily, Signa Kell, MD, Stopped at 08/06/17 0830 .  fentaNYL (SUBLIMAZE) injection 12.5 mcg, 12.5 mcg, Intravenous, Q4H, Jackson Latino A, MD .  ferrous sulfate tablet 325 mg, 325 mg, Oral, Q breakfast, Signa Kell, MD, Stopped at 08/07/17 0805 .  hydrALAZINE (APRESOLINE) injection 5 mg, 5 mg, Intravenous, Q4H PRN, Annett Fabian, MD .  ipratropium-albuterol (DUONEB) 0.5-2.5 (3) MG/3ML nebulizer solution 3 mL, 3 mL, Nebulization, Q6H PRN, Signa Kell, MD .  lisinopril (PRINIVIL,ZESTRIL) tablet 20 mg, 20 mg, Oral, Daily, Signa Kell, MD, 20 mg at 08/06/17 4132 .  loratadine (CLARITIN) tablet 10 mg, 10 mg, Oral, Daily, Signa Kell, MD, Stopped at 08/06/17 [phone] .  LORazepam (ATIVAN) tablet 0.5 mg, 0.5 mg, Oral, Q12H PRN, Gouru, Aruna, MD, 0.5 mg at 08/07/17 0924 .  methocarbamol (ROBAXIN) tablet 500 mg, 500 mg, Oral, Q6H PRN **OR** methocarbamol (ROBAXIN) 500 mg in dextrose 5 % 50 mL IVPB, 500 mg, Intravenous, Q6H  PRN, Signa Kell, MD .  metoCLOPramide (REGLAN) tablet 5-10 mg, 5-10 mg, Oral, Q8H PRN **OR** metoCLOPramide (REGLAN) injection 5-10 mg, 5-10 mg, Intravenous, Q8H PRN, Signa Kell, MD .  morphine (MS CONTIN) 12 hr tablet 60 mg, 60 mg, Oral, TID, Signa Kell, MD, 60 mg at 08/07/17 1105 .  multivitamin with minerals tablet 1 tablet, 1 tablet, Oral, Daily, Signa Kell, MD, Stopped at 08/06/17 [phone] .  ondansetron (ZOFRAN) tablet 4 mg, 4 mg, Oral, Q6H PRN, 4 mg at 08/07/17 0654 **OR** ondansetron (ZOFRAN) injection 4 mg, 4 mg, Intravenous, Q6H PRN, Signa Kell, MD, 4 mg at 08/07/17 1303 .  oxyCODONE (Oxy IR/ROXICODONE) immediate release tablet 10 mg, 10 mg, Oral, TID, Signa Kell, MD, 10 mg at 08/07/17 1105 .  oxyCODONE (Oxy IR/ROXICODONE) immediate release tablet 5-15 mg, 5-15 mg, Oral, Q3H PRN, Signa Kell, MD .  pantoprazole (PROTONIX) EC tablet 40 mg, 40 mg, Oral, Daily, Signa Kell, MD .  promethazine (PHENERGAN) injection 12.5 mg, 12.5 mg, Intravenous, Q6H PRN, Signa Kell, MD, 12.5 mg at 08/07/17 1540 .  senna-docusate (Senokot-S) tablet 1 tablet, 1 tablet, Oral, QHS PRN, Signa Kell, MD .  sodium chloride 0.9 % bolus 500 mL, 500 mL, Intravenous, Once, Sudini, Srikar, MD .  sodium phosphate (FLEET) 7-19 GM/118ML enema 1 enema, 1 enema, Rectal, Once PRN, Signa Kell, MD .  traMADol Janean Sark) tablet 50 mg, 50 mg, Oral, Q6H PRN, Signa Kell, MD, 50 mg at 08/06/17 1953 .  traZODone (DESYREL) tablet 25 mg, 25 mg, Oral, QHS PRN, Signa Kell, MD  IMAGING    Ct Head Wo Contrast  Result Date: 08/07/2017 CLINICAL DATA:  Altered mental status.  History  of hypertension. EXAM: CT HEAD WITHOUT CONTRAST TECHNIQUE: Contiguous axial images were obtained from the base of the skull through the vertex without intravenous contrast. COMPARISON:  CT HEAD October 30, 2017 FINDINGS: BRAIN: No intraparenchymal hemorrhage, mass effect nor midline shift. The ventricles and sulci are normal for age. Patchy  supratentorial white matter hypodensities. No acute large vascular territory infarcts. No abnormal extra-axial fluid collections. Basal cisterns are patent. VASCULAR: Mild calcific atherosclerosis of the carotid siphons. SKULL: No skull fracture. No significant scalp soft tissue swelling. SINUSES/ORBITS: The mastoid air-cells and included paranasal sinuses are well-aerated.The included ocular globes and orbital contents are non-suspicious. Status post bilateral ocular lens implants. OTHER: None. IMPRESSION: 1. No acute intracranial process. 2. Stable moderate chronic small vessel ischemic changes. Electronically Signed   By: Awilda Metro M.D.   On: 08/07/2017 17:47       MAJOR EVENTS/TEST RESULTS: As above  INDWELLING DEVICES:: Peripheral IV  MICRO DATA: MRSA PCR negative  ANTIMICROBIALS:  Nil   ASSESSMENT/PLAN   1. Acute change in Mental status- likely from Phenergan and Reglan/ elevated BP. CT head showed no acute pathology. Plan: Avoid mood altering meds, D/C Dilaudid.  Use low dose fentanyl of pain.  2. Uncontrolled Hypertension Plan: prn Hydralazine, maintenance home meds  3. Intractable nausea likely from residual anesthesia vs uncontrolled HTN Plan; Continue on antiemetic, BP control, will consider Erythromycin or Marinol if still intractable.  nausea.  4. Post operative Anemia Plan: transfuse for Hg < 7  5. Left Hip FX secondary to fall POD # 1 of repair Plan: DVT prophylaxis, incentive spirometry, pain control, PT/OT   6. Acute Kidney Injury has resolved Plan: avoid nephrotoxic drugs   Family: will be updated when available  Thank you for allowing me the privilege to care for this patient.  I have dedicated a total of 40 minutes in critical care time minus all appropriate exclusions.  6. Acute Kidney injury has resolved Plan: avoid n Jackson Latino, M.D

## 2017-08-07 NOTE — Progress Notes (Signed)
PT Cancellation Note  Patient Details Name: Alison LingoConstance A Fornes MRN: [id number] DOB: Feb 07, 1946   Cancelled Treatment:    Reason Eval/Treat Not Completed: Other (comment). Pt continues to be nauseated and vomiting with 4 emesis bags in bed with her. Cold cloth on chest. Pt reports severe pain and refuses attempts to sit at EOB. Unable to participate in therapy this date. Will re-attempt next date.   Amberlin Utke 08/07/2017, 2:14 PM  Elizabeth PalauStephanie Kenndra Morris, PT, DPT [phone]

## 2017-08-07 NOTE — Progress Notes (Signed)
[facility] has been received. Per Ladona Ridgelaylor patient [facility] to room 214 if medially stable. Patient is aware of above. Patient's grandson Alison Price is at bedside and aware of above.  B[employer] Hughes IncorporatedBailey Yides Saidi, LCSW [phone]

## 2017-08-07 NOTE — Progress Notes (Signed)
PT Cancellation Note  Patient Details Name: Ula LingoConstance A Mohabir MRN: [id number] DOB: 1945/11/26   Cancelled Treatment:    Reason Eval/Treat Not Completed: Other (comment). Consult received and chart reviewed. Evaluation attempted, however pt in severe pain "more than you can imagine" and is very nauseated, currently sticking finger in her throat to self induce vomiting. Reports she has been vomiting all morning. RN notified and reports she just gave pain meds. Pt currently refusing therapy stating she is unable to tolerate due to feeling poorly. Agreeable to re-attempt in PM session, symptoms permitting. Will re-attempt.   Ryer Asato 08/07/2017, 11:18 AM  Elizabeth PalauStephanie Mikita Lesmeister, PT, DPT [phone]

## 2017-08-08 DIAGNOSIS — E876 Hypokalemia: Secondary | ICD-10-CM

## 2017-08-08 LAB — CBC
HEMATOCRIT: 23.1 % — AB (ref 35.0–47.0)
HEMOGLOBIN: 8.1 g/dL — AB (ref 12.0–16.0)
MCH: 31.5 pg (ref 26.0–34.0)
MCHC: 35 g/dL (ref 32.0–36.0)
MCV: 90 fL (ref 80.0–100.0)
Platelets: 132 10*3/uL — ABNORMAL LOW (ref 150–440)
RBC: 2.56 MIL/uL — ABNORMAL LOW (ref 3.80–5.20)
RDW: 14 % (ref 11.5–14.5)
WBC: 7.5 10*3/uL (ref 3.6–11.0)

## 2017-08-08 LAB — TROPONIN I
TROPONIN I: 0.31 ng/mL — AB (ref <0.03)
Troponin I: 0.28 ng/mL (ref <0.03)

## 2017-08-08 LAB — COMPREHENSIVE METABOLIC PANEL
ALBUMIN: 3.7 g/dL (ref 3.5–5.0)
ALK PHOS: 70 U/L (ref 38–126)
ALT: 11 U/L — ABNORMAL LOW (ref 14–54)
ANION GAP: 11 (ref 5–15)
AST: 27 U/L (ref 15–41)
BUN: 21 mg/dL — ABNORMAL HIGH (ref 6–20)
CALCIUM: 8.2 mg/dL — AB (ref 8.9–10.3)
CO2: 24 mmol/L (ref 22–32)
Chloride: 102 mmol/L (ref 101–111)
Creatinine, Ser: 0.57 mg/dL (ref 0.44–1.00)
GFR calc Af Amer: >60 mL/min (ref >60)
GFR calc non Af Amer: >60 mL/min (ref >60)
GLUCOSE: 123 mg/dL — AB (ref 65–99)
POTASSIUM: 3 mmol/L — AB (ref 3.5–5.1)
SODIUM: 137 mmol/L (ref 135–145)
Total Bilirubin: 0.6 mg/dL (ref 0.3–1.2)
Total Protein: 6.7 g/dL (ref 6.5–8.1)

## 2017-08-08 LAB — MAGNESIUM: MAGNESIUM: 1.6 mg/dL — AB (ref 1.7–2.4)

## 2017-08-08 MED ORDER — DIPHENHYDRAMINE HCL 25 MG PO CAPS
25.0000 mg | ORAL_CAPSULE | Freq: Two times a day (BID) | ORAL | Status: DC
  Filled 2017-08-08: qty 1

## 2017-08-08 MED ORDER — DIMENHYDRINATE 50 MG PO TABS
50.0000 mg | ORAL_TABLET | Freq: Three times a day (TID) | ORAL | Status: DC
  Administered 2017-08-08 – 2017-08-10 (×5): 50 mg via ORAL
  Filled 2017-08-08 (×3): qty 1

## 2017-08-08 MED ORDER — SCOPOLAMINE 1 MG/3DAYS TD PT72
1.0000 | MEDICATED_PATCH | TRANSDERMAL | Status: DC
  Administered 2017-08-08: 1.5 mg via TRANSDERMAL
  Filled 2017-08-08: qty 1

## 2017-08-08 MED ORDER — POTASSIUM CHLORIDE 10 MEQ/100ML IV SOLN
10.0000 meq | INTRAVENOUS | Status: AC
  Administered 2017-08-08 (×3): 10 meq via INTRAVENOUS
  Filled 2017-08-08 (×5): qty 100

## 2017-08-08 NOTE — Progress Notes (Signed)
[facility] pt on room air, VSS, pt placed on off unit tele and CCMD notified, [facility]

## 2017-08-08 NOTE — Progress Notes (Signed)
OT Cancellation Note  Patient Details Name: Alison LingoConstance A Alesi MRN: [id number] DOB: Jun 23, 1945   Cancelled Treatment:    Reason Eval/Treat Not Completed: Other (comment). Pt noted with change in status, transfer to ICU last evening, 6/21. Per policy, requires new orders to resume OT services after transfer to higher level of care. Please place new order as patient medically appropriate.  Richrd PrimeJamie Stiller, MPH, MS, OTR/L ascom [phone] 08/08/17, 8:47 AM

## 2017-08-08 NOTE — Anesthesia Postprocedure Evaluation (Signed)
Anesthesia Post Note  Patient: Alison Price  Procedure(s) Performed: INTRAMEDULLARY (IM) NAIL INTERTROCHANTRIC (Left Hip)  Patient location during evaluation: ICU Anesthesia Type: General Level of consciousness: awake and alert Pain management: pain level controlled Vital Signs Assessment: post-procedure vital signs reviewed and stable Respiratory status: spontaneous breathing, nonlabored ventilation, respiratory function stable and patient connected to nasal cannula oxygen Cardiovascular status: stable and blood pressure returned to baseline Anesthetic complications: no     Last Vitals:  Vitals:   08/08/17 0500 08/08/17 0600  BP: (!) 181/80 (!) 162/83  Pulse: 89 96  Resp: 19 (!) 25  Temp:    SpO2: 100% (!) 76%    Last Pain:  Vitals:   08/08/17 0341  TempSrc:   PainSc: Asleep                 Cleda MccreedyJoseph K Piscitello

## 2017-08-08 NOTE — Progress Notes (Addendum)
PT Cancellation Note  Patient Details Name: Alison Price MRN: [id number] DOB: 04/14/1945   Reason Eval/Treat Not Completed: Other (comment)  Since pt has been transferred to CCU new orders have not been received to resume/ evaluate patient. Multiple attempts to evaluate prior to her being transferred to the CCU were performed but pt refused or was unable participate. Per pt's nurse pt continues to be nauseated and vomiting frequently and is unsure if she would be able to participate in any treatment. Plan to discharge pt from PT at this time until she is stable to resume physical therapy and updated orders have been written from her provider.    Derita Michelsen PT, DPT, LAT, ATC  08/08/17  10:52 AM

## 2017-08-08 NOTE — Progress Notes (Signed)
Patient has been complaining of nausea but asking for drinks. Had ginger ale and ice chips. Administered Reglan, Zofran, and phenergan this shift. Has only had dry heaves, no vomiting.  Continue on maintenance fluids. Medicated for high blood pressure x 1. No other concerns at this time. Very forgetful at times, thinks she was calling out for help when she was asleep. Asleep and thought she was awake. Needs frequent monitoring at times.

## 2017-08-08 NOTE — Progress Notes (Signed)
[facility] [place] at [facility]   PATIENT NAME: [facility]    MR#:  [id number]  DATE OF BIRTH:  November 05, 1945  SUBJECTIVE:  CHIEF COMPLAINT:  Lt hip pain is ok, had surgery 6/20. Nauseous and anxious today Patient was transferred to ICU for altered mental status yesterday evening and CT head is negative.  Doing much better today and transferred back to floor  REVIEW OF SYSTEMS:  CONSTITUTIONAL: No fever, fatigue or weakness.  EYES: No blurred or double vision.  EARS, NOSE, AND THROAT: No tinnitus or ear pain.  RESPIRATORY: No cough, shortness of breath, wheezing or hemoptysis.  CARDIOVASCULAR: No chest pain, orthopnea, edema.  GASTROINTESTINAL: nausea,  No vomiting, diarrhea or abdominal pain.  GENITOURINARY: No dysuria, hematuria.  ENDOCRINE: No polyuria, nocturia,  HEMATOLOGY: No anemia, easy bruising or bleeding SKIN: No rash or lesion. MUSCULOSKELETAL:lt hip pain with movement NEUROLOGIC: No tingling, numbness, weakness.  PSYCHIATRY: Anxious, denies  depression.   DRUG ALLERGIES:   Allergies  Allergen Reactions  . Codeine Nausea And Vomiting  . Sulfa Antibiotics Other (See Comments)    STEVEN JOHNSON SYNDROME    VITALS:  Blood pressure (!) 160/86, pulse (!) 109, temperature 98.4 F (36.9 C), resp. rate (!) 25, height 5\' 8"  (1.727 m), weight 63.8 kg (140 lb 10.5 oz), SpO2 96 %.  PHYSICAL EXAMINATION:  GENERAL:  72 y.o.-year-old patient lying in the bed with no acute distress.  EYES: Pupils equal, round, reactive to light and accommodation. No scleral icterus. Extraocular muscles intact.  HEENT: Head atraumatic, normocephalic. Oropharynx and nasopharynx clear.  NECK:  Supple, no jugular venous distention. No thyroid enlargement, no tenderness.  LUNGS: Normal breath sounds bilaterally, no wheezing, rales,rhonchi or crepitation. No use of accessory muscles of respiration.  CARDIOVASCULAR: S1, S2 normal. No murmurs, rubs, or gallops.   ABDOMEN: Soft, nontender, nondistended. Bowel sounds present. No organomegaly or mass.  EXTREMITIES:lt hip is tender No pedal edema, cyanosis, or clubbing.  NEUROLOGIC: Cranial nerves II through XII are intact. Sensation intact. Gait not checked.  PSYCHIATRIC: The patient is alert and oriented x 3.  SKIN: No obvious rash, lesion, or ulcer.    LABORATORY PANEL:   CBC Recent Labs  Lab 08/08/17 0617  WBC 7.5  HGB 8.1*  HCT 23.1*  PLT 132*   ------------------------------------------------------------------------------------------------------------------  Chemistries  Recent Labs  Lab 08/08/17 0617  NA 137  K 3.0*  CL 102  CO2 24  GLUCOSE 123*  BUN 21*  CREATININE 0.57  CALCIUM 8.2*  MG 1.6*  AST 27  ALT 11*  ALKPHOS 70  BILITOT 0.6   ------------------------------------------------------------------------------------------------------------------  Cardiac Enzymes Recent Labs  Lab 08/08/17 0617  TROPONINI 0.31*   ------------------------------------------------------------------------------------------------------------------  RADIOLOGY:  Ct Head Wo Contrast  Result Date: 08/07/2017 CLINICAL DATA:  Altered mental status.  History of hypertension. EXAM: CT HEAD WITHOUT CONTRAST TECHNIQUE: Contiguous axial images were obtained from the base of the skull through the vertex without intravenous contrast. COMPARISON:  CT HEAD October 30, 2017 FINDINGS: BRAIN: No intraparenchymal hemorrhage, mass effect nor midline shift. The ventricles and sulci are normal for age. Patchy supratentorial white matter hypodensities. No acute large vascular territory infarcts. No abnormal extra-axial fluid collections. Basal cisterns are patent. VASCULAR: Mild calcific atherosclerosis of the carotid siphons. SKULL: No skull fracture. No significant scalp soft tissue swelling. SINUSES/ORBITS: The mastoid air-cells and included paranasal sinuses are well-aerated.The included ocular globes and  orbital contents are non-suspicious. Status post bilateral ocular lens implants. OTHER: None. IMPRESSION: 1.  No acute intracranial process. 2. Stable moderate chronic small vessel ischemic changes. Electronically Signed   By: Awilda Metroourtnay  Bloomer M.D.   On: 08/07/2017 17:47    EKG:   Orders placed or performed during the hospital encounter of 08/05/17  . ED EKG  . ED EKG  . EKG 12-Lead  . EKG 12-Lead    ASSESSMENT AND PLAN:    1.  Left hip fracture, that is post mechanical fall.   Patient had surgery 08/06/17.   Continue pain control prn  Post op care per ortho  with low risk for postop cardiopulmonary complications. No bm yet  2.  Chronic pain, due to spine osteoarthritis.  Continue pain control.  3.  Hypertension, bp is elevated , resume  meds amlodipine and lisinopril titrate prn   4.   Hypokalemia replete and recheck in a.m.  Check magnesium also  5.  Acute blood loss anemia hb dropped down to 7.4 postoperatively.  Status post 1 unit of blood transfusion and hemoglobin at 8.1 today  6.  Anxiety Ativan as needed  7.  Acute encephalopathy probably postoperatively from anesthesia and sundowning improved significantly CT head is negative  Disposition SNF awaiting authorization, probably on Monday  All the records are reviewed and case discussed with Care Management/Social Workerr. Management plans discussed with the patient, family and they are in agreement.  CODE STATUS: fc  TOTAL TIME TAKING CARE OF THIS PATIENT: 36minutes.   POSSIBLE D/C IN 2 DAYS, DEPENDING ON CLINICAL CONDITION.  Note: This dictation was prepared with Dragon dictation along with smaller phrase technology. Any transcriptional errors that result from this process are unintentional.   Ramonita LabAruna Idus Rathke M.D on 08/08/2017 at 4:08 PM  Between 7am to 6pm - Pager - [phone] After 6pm go to [email or web address] - password [email or web address] [email or web address]  [facility]  Office  [phone]  CC: Primary care physician;  Jaclyn Shaggyate, Denny C, MD

## 2017-08-08 NOTE — Progress Notes (Signed)
Spoke with Dr. Lowella FairyMiaer pt with a 6 beat run of v-tach. MD wants to make sure magnesium and potassium are ordered for morning.

## 2017-08-08 NOTE — Progress Notes (Signed)
PULMONARY / CRITICAL CARE MEDICINE   Name: Alison Price MRN: [id number] DOB: [birth year]    ADMISSION DATE:  08/05/2017  HISTORY OF PRESENT ILLNESS:   Alison Price a72 y.o.femalewith a known history of chronic pain, hypertension and anemia. Patient was brought to emergency room via EMS status post mechanical fall while she was at the [employer],few hours ago.She landed on her left side and immediately started to complain of severe left hip pain.She has been unable to get up or bear weight on the left leg,since the fall. Patient continues to complain of severe pain and left hip joint worse with movement.She denies losing her consciousness or hitting her head. Fentanyl IV helped very little with her pain. She denies any chest pain or shortness of breath with exertion.No history of heart disease.She is usually able to walk more than 100 feet without any chest pain or shortness of breath.Her physical activity is limited though by her chronic pain. Blood test done in emergency room revealed the elevated creatinine level of 1.33.Hemoglobin level is low at 10.6.Platelet count is 109. Left hip x-ray, reviewed by myself, showsacute nondisplaced LEFT femur fracture extending through the greater trochanter.   Patient is POD # 2 of Intramedullary nail of left  intertrochanteric  Hip.  Patient complained of severe nausea most of the day and was treated with multiple meds.  She was found to have decrease in MS and therefore went for stat CT head then transfer to ICU for evaluation and management.  She arrived in the ICU hypertensive with BP of 190/92 and with complaints of the need to void.     REVIEW OF SYSTEMS:   Review of Systems  Constitutional: Negative for chills, diaphoresis, fever, malaise/fatigue and weight loss.  HENT: Negative.   Eyes: Negative.   Respiratory: Negative.   Cardiovascular: Positive for leg swelling.       Left operative site   Gastrointestinal: Positive for nausea. Negative for abdominal pain, diarrhea and vomiting.  Genitourinary: Negative.   Musculoskeletal: Negative.   Skin: Negative.   Neurological: Negative.   Endo/Heme/Allergies: Negative.   Psychiatric/Behavioral: Negative.     SUBJECTIVE:  Patient still complaining of excessive nausea which she states is chronic.  VITAL SIGNS: BP (!) 180/85   Pulse (!) 101   Temp 98.6 F (37 C) (Oral)   Resp (!) 21   Ht 5\' 8"  (1.727 m)   Wt 140 lb 10.5 oz (63.8 kg)   SpO2 95%   BMI 21.39 kg/m   HEMODYNAMICS:  no compromise  OXYGEN:  room air  INTAKE / OUTPUT: I/O last 3 completed shifts: In: 1539.2 [I.V.:1422.5; IV Piggyback:116.7] Out: 2050 [Urine:1450; Emesis/NG output:600]  PHYSICAL EXAMINATION: GENERAL:comfortable in bed without distress, speaking in clear sentenses EYES: Pupils equal, round, reactive to light and accommodation. No scleral icterus. Extraocular muscles intact.  HEENT: Head atraumatic, normocephalic. Oropharynx and nasopharynx clear.  NECK: Supple, no jugular venous distention. No thyroid enlargement, no tenderness.  LUNGS: Normal breath sounds bilaterally, no wheezing, rales,rhonchi or crepitation. No use of accessory muscles of respiration.  CARDIOVASCULAR: S1, S2 normal. No murmurs, rubs, or gallops.  ABDOMEN: Soft, nontender, nondistended. Bowel sounds present. No organomegaly or mass.  EXTREMITIES:lt hip is tender No pedal edema, cyanosis, or clubbing.  NEUROLOGIC: Cranial nerves II through XII are intact. Sensation intact. Gait not checked.  PSYCHIATRIC: The patient is alert and oriented x 3.  SKIN: No obvious rash, lesion, or ulcer. Left operative site clear of ecchymosis   LABS:  BMET Recent Labs  Lab 08/07/17 0304 08/07/17 1852 08/08/17 0617  NA 135 138 137  K 4.6 3.8 3.0*  CL 105 105 102  CO2 23 20* 24  BUN 30* 26* 21*  CREATININE 0.87 0.62 0.57  GLUCOSE 143* 139* 123*    Electrolytes Recent Labs   Lab 08/07/17 0304 08/07/17 1852 08/08/17 0617  CALCIUM 8.1* 8.4* 8.2*  MG  --   --  1.6*    CBC Recent Labs  Lab 08/07/17 0304 08/07/17 1852 08/08/17 0617  WBC 8.7 7.6 7.5  HGB 7.4* 7.9* 8.1*  HCT 21.2* 22.4* 23.1*  PLT 114* 113* 132*    Coag's Recent Labs  Lab 08/06/17 0951  INR 1.10    Sepsis Markers No results for input(s): LATICACIDVEN, PROCALCITON, O2SATVEN in the last 168 hours.  ABG No results for input(s): PHART, PCO2ART, PO2ART in the last 168 hours.  Liver Enzymes Recent Labs  Lab 08/05/17 2008 08/07/17 0304 08/08/17 0617  AST 28 23 27   ALT 15 11* 11*  ALKPHOS 92 64 70  BILITOT 0.6 0.5 0.6  ALBUMIN 4.5 3.1* 3.7    Cardiac Enzymes Recent Labs  Lab 08/07/17 1852 08/08/17 0007 08/08/17 0617  TROPONINI 0.08* 0.28* 0.31*    Glucose Recent Labs  Lab 08/07/17 1703  GLUCAP 137*    Imaging Ct Head Wo Contrast  Result Date: 08/07/2017 CLINICAL DATA:  Altered mental status.  History of hypertension. EXAM: CT HEAD WITHOUT CONTRAST TECHNIQUE: Contiguous axial images were obtained from the base of the skull through the vertex without intravenous contrast. COMPARISON:  CT HEAD October 30, 2017 FINDINGS: BRAIN: No intraparenchymal hemorrhage, mass effect nor midline shift. The ventricles and sulci are normal for age. Patchy supratentorial white matter hypodensities. No acute large vascular territory infarcts. No abnormal extra-axial fluid collections. Basal cisterns are patent. VASCULAR: Mild calcific atherosclerosis of the carotid siphons. SKULL: No skull fracture. No significant scalp soft tissue swelling. SINUSES/ORBITS: The mastoid air-cells and included paranasal sinuses are well-aerated.The included ocular globes and orbital contents are non-suspicious. Status post bilateral ocular lens implants. OTHER: None. IMPRESSION: 1. No acute intracranial process. 2. Stable moderate chronic small vessel ischemic changes. Electronically Signed   By: Awilda Metroourtnay   Bloomer M.D.   On: 08/07/2017 17:47      CULTURES: MRSA PCR Negative  ANTIBIOTICS: Nil  SIGNIFICANT EVENTS: 6/19 admitted to hospital after fall 6/20 Nail of left hip 6/21 transferred to ICU because of MS change    LINES/TUBES: Peripheral IVs  DISCUSSION: This 72 year old lady is POD # 2 of nailing of left hip.  She continues to complain of nausea.   ASSESSMENT / PLAN:  1. Acute change in Mental status- has resolved  2. Uncontrolled Hypertension Plan: prn Hydralazine, maintenance home meds  3. Intractable nausea likely from residual anesthesia vs uncontrolled HTN Plan; add scopolamine patch since Pharmacy reports that Dramamine which the patient takes at home  is nonformular.   4. Post operative Anemia Plan: transfuse for Hg < 7  5. Left Hip FX secondary to fall POD # 2 of repair Plan: DVT prophylaxis, incentive spirometry, pain control, PT/OT   6. Acute Kidney Injury has resolved Hypokalemia is being replaced Plan: avoid nephrotoxic drugs    FAMILY  - Updates: Will be updated when available  Disp: transfer out of ICU  Jackson LatinoKarol Edee Nifong, MD Pulmonary and Critical Care Medicine [facility] Pager: [phone]  08/08/2017, 12:04 PM

## 2017-08-08 NOTE — Progress Notes (Addendum)
   08/08/17 1800  Clinical Encounter Type  Visited With Patient;Other (Comment) (patient's friends)  Visit Type Follow-up (received call regarding AD education)   Chaplain received phone call that patient's family wanted to speak to chaplain regarding advanced directives/HCPOA.  Upon arrival, chaplain learned that persons present were friends of patient.  Patient spoke of her understanding of document and concern that she might fall asleep during a long discussion as she had 'been awake quite awhile.'  Chaplain stated that she could give an overview and leave the document with patient and friends.  Chaplain provided education on document to patient and friends.  Patient and friends spoke of family dynamics which are leading patient to complete an advanced directive.  Patient expressed that she didn't want her grandson to have to make these decisions and that she "trusted these people completely."  Patient said that she had been the one to raise her grandson.  Completing an AD is a way that she feels she can continue to take care of him.  Chaplain left document with patient and encouraged her to have chaplain paged with any other questions or when ready to complete document.  Addendum:  Patient also reported that her son had died recently.

## 2017-08-08 NOTE — Progress Notes (Signed)
   Subjective: 2 Days Post-Op Procedure(s) (LRB): INTRAMEDULLARY (IM) NAIL INTERTROCHANTRIC (Left) Patient reports pain as mild with out movement. Severe with movement. Patient is in ICU. Complains of Nausea with out vomiting Denies any CP, SOB, ABD pain.   Objective: Vital signs in last 24 hours: Temp:  [98.3 F (36.8 C)-99.5 F (37.5 C)] 99.5 F (37.5 C) (06/22 0200) Pulse Rate:  [76-109] 96 (06/22 0600) Resp:  [13-25] 25 (06/22 0600) BP: (154-190)/(76-94) 162/83 (06/22 0600) SpO2:  [76 %-100 %] 76 % (06/22 0600) Weight:  [63.8 kg (140 lb 10.5 oz)] 63.8 kg (140 lb 10.5 oz) (06/22 0500)  Intake/Output from previous day: 06/21 0701 - 06/22 0700 In: -  Out: 1300 [Urine:700; Emesis/NG output:600] Intake/Output this shift: No intake/output data recorded.  Recent Labs    08/06/17 0330 08/06/17 1851 08/07/17 0304 08/07/17 1852 08/08/17 0617  HGB 10.1* 8.7* 7.4* 7.9* 8.1*   Recent Labs    08/07/17 1852 08/08/17 0617  WBC 7.6 7.5  RBC 2.51* 2.56*  HCT 22.4* 23.1*  PLT 113* 132*   Recent Labs    08/07/17 1852 08/08/17 0617  NA 138 137  K 3.8 3.0*  CL 105 102  CO2 20* 24  BUN 26* 21*  CREATININE 0.62 0.57  GLUCOSE 139* 123*  CALCIUM 8.4* 8.2*   Recent Labs    08/06/17 0951  INR 1.10    EXAM General - Patient is Alert, Appropriate and Oriented Extremity - Neurovascular intact Sensation intact distally Intact pulses distally Dorsiflexion/Plantar flexion intact No cellulitis present Compartment soft Dressing - dressing C/D/I and no drainage Motor Function - intact, moving foot and toes well on exam.   Past Medical History:  Diagnosis Date  . Anemia   . Arthritis   . Hypertension   . Osteoporosis   . Stevens-Johnson syndrome (HCC)     Assessment/Plan:   2 Days Post-Op Procedure(s) (LRB): INTRAMEDULLARY (IM) NAIL INTERTROCHANTRIC (Left) Active Problems:   Hip fx (HCC) Acute post op blood loss anemia   Estimated body mass index is 21.39  kg/m as calculated from the following:   Height as of this encounter: 5\' 8"  (1.727 m).   Weight as of this encounter: 63.8 kg (140 lb 10.5 oz). Advance diet Up with therapy Acute post op blood loss anemia - Continue with Iron supplement. Hgb stable, trending up Needs BM   DVT Prophylaxis - Lovenox, Foot Pumps and TED hose Weight-Bearing as tolerated to left leg   T. Cranston Neighborhris Paizley Ramella, PA-C [facility] 08/08/2017, 8:03 AM

## 2017-08-09 DIAGNOSIS — N179 Acute kidney failure, unspecified: Secondary | ICD-10-CM | POA: Diagnosis not present

## 2017-08-09 DIAGNOSIS — F05 Delirium due to known physiological condition: Secondary | ICD-10-CM | POA: Diagnosis not present

## 2017-08-09 DIAGNOSIS — S72115A Nondisplaced fracture of greater trochanter of left femur, initial encounter for closed fracture: Secondary | ICD-10-CM | POA: Diagnosis not present

## 2017-08-09 DIAGNOSIS — G9349 Other encephalopathy: Secondary | ICD-10-CM | POA: Diagnosis not present

## 2017-08-09 DIAGNOSIS — D62 Acute posthemorrhagic anemia: Secondary | ICD-10-CM | POA: Diagnosis not present

## 2017-08-09 DIAGNOSIS — M810 Age-related osteoporosis without current pathological fracture: Secondary | ICD-10-CM | POA: Diagnosis not present

## 2017-08-09 DIAGNOSIS — I10 Essential (primary) hypertension: Secondary | ICD-10-CM | POA: Diagnosis not present

## 2017-08-09 DIAGNOSIS — G8929 Other chronic pain: Secondary | ICD-10-CM | POA: Diagnosis not present

## 2017-08-09 DIAGNOSIS — L511 Stevens-Johnson syndrome: Secondary | ICD-10-CM | POA: Diagnosis not present

## 2017-08-09 LAB — BPAM RBC
BLOOD PRODUCT EXPIRATION DATE: 201906252359
Blood Product Expiration Date: 201907112359
ISSUE DATE / TIME: 201905311018
UNIT TYPE AND RH: 600
Unit Type and Rh: 6200

## 2017-08-09 LAB — TYPE AND SCREEN
ABO/RH(D): A POS
ANTIBODY SCREEN: NEGATIVE
Unit division: 0
Unit division: 0

## 2017-08-09 LAB — CBC
HEMATOCRIT: 23.1 % — AB (ref 35.0–47.0)
Hemoglobin: 8 g/dL — ABNORMAL LOW (ref 12.0–16.0)
MCH: 31.2 pg (ref 26.0–34.0)
MCHC: 34.6 g/dL (ref 32.0–36.0)
MCV: 90.1 fL (ref 80.0–100.0)
Platelets: 171 10*3/uL (ref 150–440)
RBC: 2.56 MIL/uL — AB (ref 3.80–5.20)
RDW: 13.9 % (ref 11.5–14.5)
WBC: 7.4 10*3/uL (ref 3.6–11.0)

## 2017-08-09 LAB — COMPREHENSIVE METABOLIC PANEL
ALT: 13 U/L — ABNORMAL LOW (ref 14–54)
ANION GAP: 9 (ref 5–15)
AST: 26 U/L (ref 15–41)
Albumin: 3.4 g/dL — ABNORMAL LOW (ref 3.5–5.0)
Alkaline Phosphatase: 64 U/L (ref 38–126)
BUN: 23 mg/dL — ABNORMAL HIGH (ref 6–20)
CHLORIDE: 105 mmol/L (ref 101–111)
CO2: 25 mmol/L (ref 22–32)
CREATININE: 0.62 mg/dL (ref 0.44–1.00)
Calcium: 8.4 mg/dL — ABNORMAL LOW (ref 8.9–10.3)
Glucose, Bld: 110 mg/dL — ABNORMAL HIGH (ref 65–99)
Potassium: 3.1 mmol/L — ABNORMAL LOW (ref 3.5–5.1)
SODIUM: 139 mmol/L (ref 135–145)
Total Bilirubin: 0.5 mg/dL (ref 0.3–1.2)
Total Protein: 6.5 g/dL (ref 6.5–8.1)

## 2017-08-09 LAB — MAGNESIUM: MAGNESIUM: 1.7 mg/dL (ref 1.7–2.4)

## 2017-08-09 MED ORDER — MINERAL OIL PO OIL
15.0000 mL | TOPICAL_OIL | Freq: Every day | ORAL | Status: DC | PRN
  Administered 2017-08-09: 15 mL via ORAL
  Filled 2017-08-09 (×3): qty 30

## 2017-08-09 MED ORDER — OXYCODONE-ACETAMINOPHEN 5-325 MG PO TABS: 1.0000 | ORAL_TABLET | Freq: Four times a day (QID) | ORAL | 0 refills | Status: DC | PRN

## 2017-08-09 MED ORDER — MORPHINE SULFATE ER 60 MG PO TBCR: 60.0000 mg | EXTENDED_RELEASE_TABLET | Freq: Three times a day (TID) | ORAL | 0 refills | Status: DC

## 2017-08-09 MED ORDER — ENOXAPARIN SODIUM 40 MG/0.4ML ~~LOC~~ SOLN: 40.0000 mg | SUBCUTANEOUS | 0 refills | Status: DC

## 2017-08-09 NOTE — Discharge Summary (Addendum)
[employer] - [place] at [facility]   PATIENT NAME: [name]    MR#:  [id number]  DATE OF BIRTH:  25-Mar-1945  DATE OF ADMISSION:  08/05/2017 ADMITTING PHYSICIAN: Cammy Copa, MD  DATE OF DISCHARGE: 08/10/2017  PRIMARY CARE PHYSICIAN: Jaclyn Shaggy, MD   ADMISSION DIAGNOSIS:  Closed nondisplaced fracture of greater trochanter of left femur, initial encounter (HCC) [S72.115A] DISCHARGE DIAGNOSIS:  Active Problems:   Hip fx (HCC)  SECONDARY DIAGNOSIS:   Past Medical History:  Diagnosis Date  . Anemia   . Arthritis   . Hypertension   . Osteoporosis   . Stevens-Johnson syndrome (HCC)      ADMITTING HISTORY HISTORY OF PRESENT ILLNESS: Alison Price  is a 72 y.o. female with a known history of chronic pain, hypertension and anemia. Patient was brought to emergency room via EMS status post mechanical fall while she was at the [employer], few hours ago.  She landed on her left side and immediately started to complain of severe left hip pain.  She has been unable to get up or bear weight on the left leg, since the fall.  Patient continues to complain of severe pain and left hip joint worse with movement.  She denies losing her consciousness or hitting her head.  Fentanyl IV helped very little with her pain. She denies any chest pain or shortness of breath with exertion.  No history of heart disease.  She is usually able to walk more than 100 feet without any chest pain or shortness of breath.  Her physical activity is limited though by her chronic pain. Blood test done in emergency room revealed the elevated creatinine level of 1.33.  Hemoglobin level is low at 10.6.  Platelet count is 109. Left hip x-ray, reviewed by myself, shows acute nondisplaced LEFT femur fracture extending through the greater trochanter. Chest x-ray negative for any acute cardiopulmonary disease. EKG shows normal sinus rhythm with heart rate at 92, normal axis, normal intervals.  No  ST-T changes. HOSPITAL COURSE:   1.Left hip fracture,due to mechanical fall. Patient had surgery 08/06/17. Continue pain control prn  Discharge to SNF - going to [facility] today  2.Chronic pain,due to spine osteoarthritis.  3.Hypertension, bp is elevated , resume  meds amlodipine and lisinopril titrate prn   4.  Hypokalemia replete and recheck in a.m.  Check magnesium also  5.  Acute blood loss anemia hb dropped down to 7.4 postoperatively.  Status post 1 unit of blood transfusion and hemoglobin at 8.  6.  Anxiety Ativan as needed  7.  Acute encephalopathy probably postoperatively from anesthesia and sundowning improved significantly CT head is negative Was transferred to ICu for code stroke but ruled out    Lovenox 40 mg SQ daily x 14 days TED hose BLE x 6 weeks, patient can remove at night Follow up with [facility] in 2 weeks for recheck CONSULTS OBTAINED:  Treatment Team:  Donato Heinz, MD Signa Kell, MD  DRUG ALLERGIES:   Allergies  Allergen Reactions  . Codeine Nausea And Vomiting  . Sulfa Antibiotics Other (See Comments)    STEVEN JOHNSON SYNDROME    DISCHARGE MEDICATIONS:   Allergies as of 08/10/2017      Reactions   Codeine Nausea And Vomiting   Sulfa Antibiotics Other (See Comments)   STEVEN JOHNSON SYNDROME      Medication List    TAKE these medications   albuterol-ipratropium 18-103 MCG/ACT inhaler Commonly known as:  COMBIVENT Inhale 2 puffs  into the lungs every 6 (six) hours as needed for wheezing or shortness of breath.   amLODipine 10 MG tablet Commonly known as:  NORVASC Take 10 mg by mouth daily.   aspirin 81 MG tablet Take 81 mg by mouth daily.   butalbital-acetaminophen-caffeine 50-325-40 MG tablet Commonly known as:  FIORICET, ESGIC Take 1 tablet by mouth every 6 (six) hours as needed for headache or migraine.   conjugated estrogens vaginal cream Commonly known as:  PREMARIN 1 gram intravaginal BIW    enoxaparin 40 MG/0.4ML injection Commonly known as:  LOVENOX Inject 0.4 mLs (40 mg total) into the skin daily for 13 days.   etodolac 400 MG tablet Commonly known as:  LODINE Take 400 mg by mouth 2 (two) times daily.   Fenofibric Acid 135 MG Cpdr Take 135 mg by mouth daily.   fexofenadine 180 MG tablet Commonly known as:  ALLEGRA Take 180 mg by mouth daily.   Fluticasone-Salmeterol 250-50 MCG/DOSE Aepb Commonly known as:  ADVAIR Inhale 1 puff into the lungs 2 (two) times daily.   lisinopril 20 MG tablet Commonly known as:  PRINIVIL,ZESTRIL Take 20 mg by mouth daily.   meclizine 25 MG tablet Commonly known as:  ANTIVERT Take 25 mg by mouth 2 (two) times daily as needed for dizziness.   morphine 60 MG 12 hr tablet Commonly known as:  MS CONTIN Take 1 tablet (60 mg total) by mouth 3 (three) times daily.   multivitamin tablet Take 1 tablet by mouth daily.   ondansetron 4 MG tablet Commonly known as:  ZOFRAN Take 1 tablet (4 mg total) by mouth every 8 (eight) hours as needed for nausea or vomiting.   Oxycodone HCl 10 MG Tabs Take 10 mg by mouth 3 (three) times daily.   oxyCODONE-acetaminophen 5-325 MG tablet Commonly known as:  ROXICET Take 1 tablet by mouth every 6 (six) hours as needed for moderate pain or severe pain. What changed:  reasons to take this   pantoprazole 40 MG tablet Commonly known as:  PROTONIX Take 40 mg by mouth daily.   SLOW FE 142 (45 Fe) MG Tbcr Generic drug:  Ferrous Sulfate Take 1 tablet by mouth daily.   tiZANidine 4 MG tablet Commonly known as:  ZANAFLEX Take 4 mg by mouth every 6 (six) hours as needed.       Today   VITAL SIGNS:  Blood pressure 119/67, pulse 87, temperature 98.3 F (36.8 C), temperature source Oral, resp. rate 19, height 5\' 8"  (1.727 m), weight 53 kg (116 lb 12.8 oz), SpO2 98 %. I/O:    Intake/Output Summary (Last 24 hours) at 08/10/2017 0740 Last data filed at 08/09/2017 1939 Gross per 24 hour  Intake 240  ml  Output 400 ml  Net -160 ml    PHYSICAL EXAMINATION:  Physical Exam  GENERAL:  72 y.o.-year-old patient lying in the bed with no acute distress.  LUNGS: Normal breath sounds bilaterally, no wheezing, rales,rhonchi or crepitation. No use of accessory muscles of respiration.  CARDIOVASCULAR: S1, S2 normal. No murmurs, rubs, or gallops.  ABDOMEN: Soft, non-tender, non-distended. Bowel sounds present. No organomegaly or mass.  NEUROLOGIC: Moves all 4 extremities. PSYCHIATRIC: The patient is alert and oriented x 3.  SKIN:left hip dressing  DATA REVIEW:   CBC Recent Labs  Lab 08/09/17 0350  WBC 7.4  HGB 8.0*  HCT 23.1*  PLT 171    Chemistries  Recent Labs  Lab 08/09/17 0350  NA 139  K 3.1*  CL  105  CO2 25  GLUCOSE 110*  BUN 23*  CREATININE 0.62  CALCIUM 8.4*  MG 1.7  AST 26  ALT 13*  ALKPHOS 64  BILITOT 0.5    Cardiac Enzymes Recent Labs  Lab 08/08/17 0617  TROPONINI 0.31*     Contact information for follow-up providers    Signa KellPatel, Sunny, MD Follow up in 2 week(s).   Specialty:  Orthopedic Surgery Why:  for staple removal and steri strip application Contact information: [address] [place] [address] [phone]        Jaclyn Shaggyate, Denny C, MD. Schedule an appointment as soon as possible for a visit in 5 day(s).   Specialty:  Internal Medicine Contact information: [address]   [place] [address] [phone]            Contact information for after-discharge care    Destination    [facility] .   Service:  Skilled Nursing Contact information: [address] [place] [address] [phone]                  Management plans discussed with the patient, family and they are in agreement.  CODE STATUS:     Code Status Orders  (From admission, onward)        Start     Ordered   08/05/17 2246  Full code  Continuous     08/05/17 2245    Code Status History    Date Active  Date Inactive Code Status Order ID Comments User Context   10/24/2016 0110 10/25/2016 1807 Full Code [id number]  Oralia ManisWillis, David, MD Inpatient      TOTAL TIME TAKING CARE OF THIS PATIENT ON DAY OF DISCHARGE: more than 30 minutes.   Alison Price M.D on 08/10/2017 at 7:40 AM  Between 7am to 6pm - Pager - [phone]  After 6pm go to [email or web address] - password [email or web address] [facility]  [facility] [place] [facility]  Office  [phone]  CC: Primary care physician; Jaclyn Shaggyate, Denny C, MD  Note: This dictation was prepared with Dragon dictation along with smaller phrase technology. Any transcriptional errors that result from this process are unintentional.

## 2017-08-09 NOTE — Progress Notes (Signed)
   Subjective: 3 Days Post-Op Procedure(s) (LRB): INTRAMEDULLARY (IM) NAIL INTERTROCHANTRIC (Left) Patient reports pain as mild with out movement. moderate with movement. Patient is well, and has had no acute complaints or problems. Nausea much improved. Denies any CP, SOB, ABD pain.   Objective: Vital signs in last 24 hours: Temp:  [98.4 F (36.9 C)-99.1 F (37.3 C)] 98.6 F (37 C) (06/22 2311) Pulse Rate:  [97-110] 110 (06/22 2311) Resp:  [15-25] 18 (06/22 2311) BP: (160-183)/(83-100) 163/89 (06/22 2311) SpO2:  [95 %-100 %] 100 % (06/22 2311) Weight:  [53 kg (116 lb 12.8 oz)] 53 kg (116 lb 12.8 oz) (06/23 0653)  Intake/Output from previous day: 06/22 0701 - 06/23 0700 In: 613.3 [P.O.:120; I.V.:375; IV Piggyback:118.3] Out: 2100 [Urine:2100] Intake/Output this shift: No intake/output data recorded.  Recent Labs    08/06/17 1851 08/07/17 0304 08/07/17 1852 08/08/17 0617  HGB 8.7* 7.4* 7.9* 8.1*   Recent Labs    08/07/17 1852 08/08/17 0617  WBC 7.6 7.5  RBC 2.51* 2.56*  HCT 22.4* 23.1*  PLT 113* 132*   Recent Labs    08/08/17 0617 08/09/17 0350  NA 137 139  K 3.0* 3.1*  CL 102 105  CO2 24 25  BUN 21* 23*  CREATININE 0.57 0.62  GLUCOSE 123* 110*  CALCIUM 8.2* 8.4*   Recent Labs    08/06/17 0951  INR 1.10    EXAM General - Patient is Alert, Appropriate and Oriented Extremity - Neurovascular intact Sensation intact distally Intact pulses distally Dorsiflexion/Plantar flexion intact No cellulitis present Compartment soft Dressing - dressing C/D/I and no drainage Motor Function - intact, moving foot and toes well on exam.   Past Medical History:  Diagnosis Date  . Anemia   . Arthritis   . Hypertension   . Osteoporosis   . Stevens-Johnson syndrome (HCC)     Assessment/Plan:   3 Days Post-Op Procedure(s) (LRB): INTRAMEDULLARY (IM) NAIL INTERTROCHANTRIC (Left) Active Problems:   Hip fx (HCC) Acute post op blood loss anemia   Estimated  body mass index is 17.76 kg/m as calculated from the following:   Height as of this encounter: 5\' 8"  (1.727 m).   Weight as of this encounter: 53 kg (116 lb 12.8 oz). Advance diet Up with therapy Acute post op blood loss anemia - Continue with Iron supplement. Hgb stable. New labs pending this am. Needs BM Lovenox 40 mg SQ daily x 14 days TED hose BLE x 6 weeks, patient can remove at night Follow up with [facility] in 2 weeks for recheck  DVT Prophylaxis - Lovenox, Foot Pumps and TED hose Weight-Bearing as tolerated to left leg    T. [place] Neighborhris Gaines, PA-C [facility] 08/09/2017, 7:27 AM

## 2017-08-09 NOTE — Discharge Instructions (Addendum)
Lovenox 40 mg SQ daily x 14 days TED hose BLE x 6 weeks, patient can remove at night Follow up with [facility] in 2 weeks for recheck    Hip Fracture A hip fracture is a fracture of the upper part of your thigh bone (femur). What are the causes? A hip fracture is caused by a direct blow to the side of your hip. This is usually the result of a fall but can occur in other circumstances, such as an automobile accident. What increases the risk? There is an increased risk of hip fractures in people with:  An unsteady walking pattern (gait) and those with conditions that contribute to poor balance, such as Parkinson's disease or dementia.  Osteopenia and osteoporosis.  Cancer that spreads to the leg bones.  Certain metabolic diseases.  What are the signs or symptoms? Symptoms of hip fracture include:  Pain over the injured hip.  Inability to put weight on the leg in which the fracture occurred (although, some patients are able to walk after a hip fracture).  Toes and foot of the affected leg point outward when you lie down.  How is this diagnosed? A physical exam can determine if a hip fracture is likely to have occurred. X-ray exams are needed to confirm the fracture and to look for other injuries. The X-ray exam can help to determine the type of hip fracture. Rarely, the fracture is not visible on an X-ray image and a CT scan or MRI will have to be done. How is this treated? The treatment for a fracture is usually surgery. This means using a screw, nail, or rod to hold the bones in place. Follow these instructions at home: Take all medicines as directed by your health care provider. Contact a health care provider if: Pain continues, even after taking pain medicine. This information is not intended to replace advice given to you by your health care provider. Make sure you discuss any questions you have with your health care provider. Document Released: 02/03/2005 Document Revised:  07/12/2015 Document Reviewed: 09/15/2012 Elsevier Interactive Patient Education  2017 [employer]

## 2017-08-09 NOTE — Evaluation (Signed)
Physical Therapy Evaluation Patient Details Name: Alison Price MRN: [id number] DOB: [birth year] Today's Date: 08/09/2017   History of Present Illness  [name] a72 y.o.femalewith a known history of chronic pain, hypertension and anemia. Patient was brought to emergency room via EMS status post mechanical fall while she was at the supermarket; landed on her left side and immediately started to complain of severe left hip pain.She has been unable to get up or bear weight on the left leg,since the fall prior to PT. Patient continues to complain of severe pain and left hip joint worse with movement. Will be going to [facility] at D/c. WBAT with LLE.   Clinical Impression  Patient is a pleasant 72 year old who presents with pain in L hip s/p fall/fx. Patient challenged with bed mobility and transfers due to pain and weakness in LLE. Pt. WBAT per LLE at this time and was educated on weight shift in standing position using RW with heavy use of UE for support. Patient able to perform supine to sit with extensive use of UE's on side of bed however required Min A to return to supine due to difficulty lifting LLE. Pt. Unable to perform LLE SLR or march with/without gravity but noted quadriceps activation felt. Pt. Educated on bed there ex to perform to retain strength and mobility. Patient will d/c to [facility] for continued physical therapy and mobility due to barriers to d/c home. Patient will benefit from skilled physical therapy to improve mobility, strength, and ambulatory capacity to return patient to previous level of function.     Follow Up Recommendations SNF    Equipment Recommendations  3in1 (PT);Standard walker    Recommendations for Other Services       Precautions / Restrictions Restrictions Weight Bearing Restrictions: Yes LLE Weight Bearing: Weight bearing as tolerated Other Position/Activity Restrictions: WBAT with LLE, encourage mobility and strengthening       Mobility  Bed Mobility Overal bed mobility: Modified Independent;Needs Assistance Bed Mobility: Supine to Sit;Sit to Supine Rolling: (pulls to R side with UE, unable to L due to pain)   Supine to sit: Modified independent (Device/Increase time);Supervision(heavy use of UE's with HOB raised and armrails, ) Sit to supine: Min assist(Min A to return LLE onto bed. )   General bed mobility comments: Require Min A for scooting up in bed and Mod verbal cueing for sequencing of task.   Transfers Overall transfer level: Needs assistance Equipment used: Standard walker Transfers: Sit to/from Stand Sit to Stand: Min assist         General transfer comment: Require Min A for forward weight shift into stand position. "flopped" when performing eccentric portion of stand to sit due to fear of movement of LLE. Able to stand on LLE with slight weight acceptance with high pain and use of BUE.   Ambulation/Gait Ambulation/Gait assistance: (patient refused ambulation )           General Gait Details: Patient refused ambulation due to pain in standing position.   Stairs            Wheelchair Mobility    Modified Rankin (Stroke Patients Only)       Balance Overall balance assessment: Needs assistance Sitting-balance support: Single extremity supported Sitting balance-Leahy Scale: Fair Sitting balance - Comments: Require SUE when performing LE movement, no UE required when performing UE movement, indicating pain reducing stability in sitting position.  Postural control: Posterior lean;Right lateral lean(lean off of L hip for pain relief)  Standing balance-Leahy Scale: Poor Standing balance comment: Require BUE support due to limited weight acceptance onto LLE due to pain at this time.                              Pertinent Vitals/Pain Pain Assessment: 0-10 Pain Score: 7  Pain Location: L Hip  Pain Descriptors / Indicators: Aching Pain Intervention(s): Monitored  during session    Home Living Family/patient expects to be discharged to:: Skilled nursing facility([facility]) Living Arrangements: Other (Comment)(lives with grandson, will go to [facility]) Available Help at Discharge: Family Type of Home: House Home Access: Stairs to enter   Secretary/administrator of Steps: 2 Home Layout: One level   Additional Comments: Patient will d/c to [facility] SNF due to living situation and limited mobility. Curerntly lives at home with grandson in single level brick home. Reports two steps to enter, has not used AD at home. Higher toilet height however no shower seat or grab bars in bathroom.     Prior Function Level of Independence: Independent         Comments: Patient reports she was independently living at home with grandson. 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      Hand Dominance        Extremity/Trunk Assessment   Upper Extremity Assessment Upper Extremity Assessment: Overall WFL for tasks assessed    Lower Extremity Assessment Lower Extremity Assessment: Generalized weakness;LLE deficits/detail RLE Deficits / Details: 3+/5 gross strength RLE Sensation: WNL LLE Deficits / Details: limited mobility due to pain, quadriceps activation trace 2/5 against and with gravity, Passively limited by pain. Able to abduct and adduct, slight heel slide of hip flexion performed in bed.  LLE: Unable to fully assess due to pain LLE Sensation: WNL(difficulty maintaining task orientation) LLE Coordination: decreased gross motor    Cervical / Trunk Assessment Cervical / Trunk Assessment: (history of back surgery/problems)  Communication   Communication: No difficulties(does have difficulty staying on topic. )  Cognition Arousal/Alertness: Awake/alert Behavior During Therapy: WFL for tasks assessed/performed Overall Cognitive Status: No family/caregiver present to determine baseline cognitive functioning                                  General Comments: Patient has difficulty maintaining topic, frequently altering topics to non related discussions requiring cueing for task.       General Comments General comments (skin integrity, edema, etc.): Pt. LLE normal temperature, equal sensation as right. No warmth, redness, or cold noted.     Exercises General Exercises - Lower Extremity Ankle Circles/Pumps: AROM;Strengthening;Both;10 reps;Supine Quad Sets: Strengthening;Left;5 reps;Supine Heel Slides: Strengthening;Left;AROM;10 reps;Supine Hip ABduction/ADduction: AROM;Left;5 reps;Supine;Strengthening Straight Leg Raises: (unable to perform with LLE) Hip Flexion/Marching: (unable to perform with LLE) Other Exercises Other Exercises: sitting EOB balance reaching inside and outside of BOS with UE Other Exercises: standing weight shift over LLE to promote pre gait stability    Assessment/Plan    PT Assessment Patient needs continued PT services  PT Problem List Decreased strength;Decreased range of motion;Decreased activity tolerance;Decreased balance;Decreased knowledge of use of DME;Decreased mobility;Decreased safety awareness;Pain       PT Treatment Interventions Gait training;DME instruction;Functional mobility training;Stair training;Neuromuscular re-education;Balance training;Therapeutic exercise;Therapeutic activities;Patient/family education;Manual techniques    PT Goals (Current goals can be found in the Care Plan section)  Acute Rehab PT Goals Patient Stated Goal: To  return to independent mobility  PT Goal Formulation: With patient Time For Goal Achievement: 08/23/17 Potential to Achieve Goals: Fair    Frequency Min 2X/week   Barriers to discharge Inaccessible home environment decreased mobility makes patient unsafe to d/c home at this time. Would benefit from SNF for care and continued PT to improve strength, mobility, and transfers.     Co-evaluation                AM-PAC PT "6 Clicks" Daily Activity  Outcome Measure Difficulty turning over in bed (including adjusting bedclothes, sheets and blankets)?: A Lot Difficulty moving from lying on back to sitting on the side of the bed? : A Lot Difficulty sitting down on and standing up from a chair with arms (e.g., wheelchair, bedside commode, etc,.)?: Unable Help needed moving to and from a bed to chair (including a wheelchair)?: Total Help needed walking in hospital room?: Total Help needed climbing 3-5 steps with a railing? : Total 6 Click Score: 8    End of Session Equipment Utilized During Treatment: Gait belt Activity Tolerance: Patient limited by pain Patient left: in bed;with bed alarm set;with call bell/phone within reach;with SCD's reapplied Nurse Communication: Mobility status;Other (comment)(ready for d/c per PT standpoint to SNF) PT Visit Diagnosis: Unsteadiness on feet (R26.81);Other abnormalities of gait and mobility (R26.89);Muscle weakness (generalized) (M62.81);Pain Pain - Right/Left: Left Pain - part of body: Leg    Time: [id number] PT Time Calculation (min) (ACUTE ONLY): 28 min   Charges:   PT Evaluation $PT Eval Low Complexity: 1 Low PT Treatments $Therapeutic Exercise: 8-22 mins   PT G Codes:        Precious BardMarina Alitzel Cookson, PT, DPT    Precious BardMarina Alexanderia Gorby 08/09/2017, 3:44 PM

## 2017-08-09 NOTE — Progress Notes (Signed)
   08/09/17 1622  Clinical Encounter Type  Visited With Patient and family together  Visit Type Initial (Call from Unit to Complete AD)  Referral From Nurse  Consult/Referral To Chaplain  Spiritual Encounters  Spiritual Needs Prayer;Emotional;Grief support  Stress Factors  Patient Stress Factors Family relationships;Health changes;Loss   [facility] was called to complete an AD for pt. Nurse had secured witnesses and [place] proceeded to call for notary.  Notary and witnesses were gathered and AD was completed, copied, and put into pt's file. While waiting for AD to be completed, [place] engaged with pt and pt's HPOA.  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  PHOA left and [place] lingered, leading to a wonderful and tearful conversation with the pt.  We talked about pt's pregnancies and her children (since I, the [facility] am expecting in Nov).  This led to the patient sharing about her son, Elta Guadeloupe, passing away 5-6 months ago and her grief over that loss and concern for her grandson.  Pt shared that she has lots of loving people around her, but it is hard to lose a child.  Pt began speaking about the need to believe and mentioned "something trying her," knowing the Devil tries [place].  CH offered prayer and pt said she would love prayer. [place] prayed for comfort and God's presence. Pt responded that she was really glad to have met me and that she will be praying for me as well.  CH and pt both became teary and shared long hug before departing.

## 2017-08-09 NOTE — Progress Notes (Signed)
Pt alert and talking. Surgical dressing dry and intact. Iv infusing without difficulty. Pt still with some nausea but no vomiting during the night. Pt has been able to sleep in between care.

## 2017-08-09 NOTE — Progress Notes (Addendum)
Patient has still been unable to have a bowel movement. [facility]oke to [facility] and they said it will be too late to accept her tonight and their pharmacy is closed and they do not have a way to get her meds for tonight.  Spoke to ArcadiaElizabeth at [facility].  Spoke to Dr. Amado CoeGouru who said to cancel discharge for now.

## 2017-08-09 NOTE — Progress Notes (Signed)
PT Note:  Per rehab guidelines, treatment frequency for patient to be BID as patient is admitted with surgical orthopedic diagnosis.  Treatment frequency updated to reflect.  Lorimer Tiberio H. Manson PasseyBrown, PT, DPT, NCS 08/09/17, 10:21 PM [phone]

## 2017-08-09 NOTE — Progress Notes (Signed)
Explained to pt that she needs a bowel movement before discharge and offered suppository this morning. Pt is refusing to have suppository at this time this morning.

## 2017-08-09 NOTE — Clinical Social Work Note (Signed)
The patient will discharge today to [facility] via nonemergent EMS pending BM. CSW has sent all documentation and delivered the discharge packet. Please consult should needs change. CSW is signing off.  Argentina PonderKaren Martha Tykwon Fera, MSW, Theresia MajorsLCSWA [phone]

## 2017-08-09 NOTE — Progress Notes (Signed)
[facility] is being [facility] pending BM. Report called to [facility] RN. Advised her that we are waiting on a BM. Waiting to call EMS until patient is ready to go.

## 2017-08-10 DIAGNOSIS — D649 Anemia, unspecified: Secondary | ICD-10-CM | POA: Diagnosis not present

## 2017-08-10 DIAGNOSIS — M810 Age-related osteoporosis without current pathological fracture: Secondary | ICD-10-CM | POA: Diagnosis not present

## 2017-08-10 DIAGNOSIS — S72142D Displaced intertrochanteric fracture of left femur, subsequent encounter for closed fracture with routine healing: Secondary | ICD-10-CM | POA: Diagnosis not present

## 2017-08-10 DIAGNOSIS — S72002A Fracture of unspecified part of neck of left femur, initial encounter for closed fracture: Secondary | ICD-10-CM | POA: Diagnosis not present

## 2017-08-10 DIAGNOSIS — M479 Spondylosis, unspecified: Secondary | ICD-10-CM | POA: Diagnosis not present

## 2017-08-10 DIAGNOSIS — Z9181 History of falling: Secondary | ICD-10-CM | POA: Diagnosis not present

## 2017-08-10 DIAGNOSIS — G8929 Other chronic pain: Secondary | ICD-10-CM | POA: Diagnosis not present

## 2017-08-10 DIAGNOSIS — I10 Essential (primary) hypertension: Secondary | ICD-10-CM | POA: Diagnosis not present

## 2017-08-10 DIAGNOSIS — X58XXXD Exposure to other specified factors, subsequent encounter: Secondary | ICD-10-CM | POA: Diagnosis not present

## 2017-08-10 DIAGNOSIS — S72145D Nondisplaced intertrochanteric fracture of left femur, subsequent encounter for closed fracture with routine healing: Secondary | ICD-10-CM | POA: Diagnosis not present

## 2017-08-10 DIAGNOSIS — F419 Anxiety disorder, unspecified: Secondary | ICD-10-CM | POA: Diagnosis not present

## 2017-08-10 DIAGNOSIS — M6281 Muscle weakness (generalized): Secondary | ICD-10-CM | POA: Diagnosis not present

## 2017-08-10 DIAGNOSIS — Z7401 Bed confinement status: Secondary | ICD-10-CM | POA: Diagnosis not present

## 2017-08-10 DIAGNOSIS — X58XXXA Exposure to other specified factors, initial encounter: Secondary | ICD-10-CM | POA: Diagnosis not present

## 2017-08-10 DIAGNOSIS — E559 Vitamin D deficiency, unspecified: Secondary | ICD-10-CM | POA: Diagnosis not present

## 2017-08-10 DIAGNOSIS — Z882 Allergy status to sulfonamides status: Secondary | ICD-10-CM | POA: Diagnosis not present

## 2017-08-10 DIAGNOSIS — D62 Acute posthemorrhagic anemia: Secondary | ICD-10-CM | POA: Diagnosis not present

## 2017-08-10 DIAGNOSIS — G9340 Encephalopathy, unspecified: Secondary | ICD-10-CM | POA: Diagnosis not present

## 2017-08-10 DIAGNOSIS — D500 Iron deficiency anemia secondary to blood loss (chronic): Secondary | ICD-10-CM | POA: Diagnosis not present

## 2017-08-10 DIAGNOSIS — Z885 Allergy status to narcotic agent status: Secondary | ICD-10-CM | POA: Diagnosis not present

## 2017-08-10 DIAGNOSIS — R262 Difficulty in walking, not elsewhere classified: Secondary | ICD-10-CM | POA: Diagnosis not present

## 2017-08-10 DIAGNOSIS — E440 Moderate protein-calorie malnutrition: Secondary | ICD-10-CM | POA: Diagnosis not present

## 2017-08-10 NOTE — Care Management Important Message (Signed)
Important Message  Patient Details  Name: Alison LingoConstance A Price MRN: [id number] Date of Birth: 11/19/1945   Medicare Important Message Given:  Yes    Olegario MessierKathy A Suvan Stcyr 08/10/2017, 10:04 AM

## 2017-08-10 NOTE — Progress Notes (Signed)
Patient is medically stable for D/C to [facility] today. Per [facility] [employer] admissions coordinator [facility] has been received and patient can come today to room 214. RN will call report at [phone] and arrange EMS for transport. Clinical Child psychotherapistocial Worker (CSW) sent D/C orders to [facility] via [employer]. Patient is aware of above. CSW left patient's grandson Ephriam KnucklesChristian a Engineer, technical salesvoicemail. Please reconsult if future social work needs arise. CSW signing off.   Baker Hughes IncorporatedBailey Jabir Dahlem, LCSW [phone]

## 2017-08-10 NOTE — Clinical Social Work Placement (Signed)
   CLINICAL SOCIAL WORK PLACEMENT  NOTE  Date:  08/10/2017  Patient Details  Name: Alison LingoConstance A Retter MRN: [id number] Date of Birth: [birth year]  Clinical Social Work is seeking post-discharge placement for this patient at the Skilled  Nursing Facility level of care (*CSW will initial, date and re-position this form in  chart as items are completed):  Yes   Patient/family provided with [facility] Clinical Social Work Department's list of facilities offering this level of care within the geographic area requested by the patient (or if unable, by the patient's family).  Yes   Patient/family informed of their freedom to choose among providers that offer the needed level of care, that participate in Medicare, Medicaid or managed care program needed by the patient, have an available bed and are willing to accept the patient.  Yes   Patient/family informed of [facility]'s ownership interest in [facility] and [facility], as well as of the fact that they are under no obligation to receive care at these facilities.  PASRR submitted to EDS on 08/06/17     PASRR number received on 08/06/17     Existing PASRR number confirmed on       FL2 transmitted to all facilities in geographic area requested by pt/family on 08/06/17     FL2 transmitted to all facilities within larger geographic area on       Patient informed that his/her managed care company has contracts with or will negotiate with certain facilities, including the following:        Yes   Patient/family informed of bed offers received.  Patient chooses bed at [facility] )     Physician recommends and patient chooses bed at      Patient to be transferred to [facility] ) on 08/10/17.  Patient to be transferred to facility by [facility] )     Patient family notified on 08/10/17 of transfer.  Name of family member notified:  (CSW left patient's grandson Christian a Engineer, technical salesvoicemail. )     PHYSICIAN        Additional Comment:    _______________________________________________ Marlenne Ridge, Darleen CrockerBailey M, LCSW 08/10/2017, 11:50 AM

## 2017-08-10 NOTE — Progress Notes (Signed)
Chaplain followed up from morning report. Pt was having many visitors from care staff. Chaplain was told that her AD was completed and she will be leaving for Rehab. Pt seemed alert and talkative.    08/10/17 1000  Clinical Encounter Type  Visited With Patient  Visit Type Follow-up  Referral From Chaplain  Spiritual Encounters  Spiritual Needs Emotional

## 2017-08-10 NOTE — Progress Notes (Signed)
   Subjective: 4 Days Post-Op Procedure(s) (LRB): INTRAMEDULLARY (IM) NAIL INTERTROCHANTRIC (Left) Patient reports pain as mild with out movement. moderate with movement. Patient is well, and has had no acute complaints or problems. Nausea much improved. Denies any CP, SOB, ABD pain.   Objective: Vital signs in last 24 hours: Temp:  [97.5 F (36.4 C)-98.3 F (36.8 C)] 98.3 F (36.8 C) (06/24 0019) Pulse Rate:  [84-87] 87 (06/24 0019) Resp:  [19] 19 (06/24 0019) BP: (112-151)/(67-80) 119/67 (06/24 0019) SpO2:  [98 %-100 %] 98 % (06/24 0019)  Intake/Output from previous day: 06/23 0701 - 06/24 0700 In: 240 [P.O.:240] Out: 400 [Urine:400] Intake/Output this shift: No intake/output data recorded.  Recent Labs    08/07/17 1852 08/08/17 0617 08/09/17 0350  HGB 7.9* 8.1* 8.0*   Recent Labs    08/08/17 0617 08/09/17 0350  WBC 7.5 7.4  RBC 2.56* 2.56*  HCT 23.1* 23.1*  PLT 132* 171   Recent Labs    08/08/17 0617 08/09/17 0350  NA 137 139  K 3.0* 3.1*  CL 102 105  CO2 24 25  BUN 21* 23*  CREATININE 0.57 0.62  GLUCOSE 123* 110*  CALCIUM 8.2* 8.4*   No results for input(s): LABPT, INR in the last 72 hours.  EXAM General - Patient is Alert, Appropriate and Oriented Extremity - Neurovascular intact Sensation intact distally Intact pulses distally Dorsiflexion/Plantar flexion intact No cellulitis present Compartment soft Dressing - dressing C/D/I and no drainage Motor Function - intact, moving foot and toes well on exam.   Past Medical History:  Diagnosis Date  . Anemia   . Arthritis   . Hypertension   . Osteoporosis   . Stevens-Johnson syndrome (HCC)     Assessment/Plan:   4 Days Post-Op Procedure(s) (LRB): INTRAMEDULLARY (IM) NAIL INTERTROCHANTRIC (Left) Active Problems:   Hip fx (HCC) Acute post op blood loss anemia   Estimated body mass index is 17.76 kg/m as calculated from the following:   Height as of this encounter: 5\' 8"  (1.727 m).  Weight as of this encounter: 53 kg (116 lb 12.8 oz). Advance diet Up with therapy Acute post op blood loss anemia - Continue with Iron supplement. Hgb stable.  Bowel movement yesterday. Lovenox 40 mg SQ daily x 14 days TED hose BLE x 6 weeks, patient can remove at night Follow up with [facility] in 2 weeks for recheck  DVT Prophylaxis - Lovenox, Foot Pumps and TED hose Weight-Bearing as tolerated to left leg   Alison Price Alison Tabak, PA-C [facility] 08/10/2017, 7:08 AM

## 2017-08-10 NOTE — Progress Notes (Signed)
Report called to Molli HazardMatthew, RN at EMS called for transportation.

## 2017-08-10 NOTE — Progress Notes (Signed)
Physical Therapy Treatment Patient Details Name: Alison Price MRN: [id number] DOB: 12-05-1945 Today's Date: 08/10/2017    History of Present Illness [name] a72 y.o.femalewith a known history of chronic pain, hypertension and anemia. Patient was brought to emergency room via EMS status post mechanical fall while she was at the [employer]; landed on her left side and immediately started to complain of severe left hip pain.She has been unable to get up or bear weight on the left leg,since the fall prior to PT. Patient continues to complain of severe pain and left hip joint worse with movement. Will be going to [facility] at D/c. WBAT with LLE.     PT Comments    Patient deferred transfer to recliner and ambulation due to having walked to the restroom several times today with RW.  Progress was demonstrated by performance of seated there ex and more independent bed mobility.  She was able to perform bed mobility to EOB with min A for assistance of initiation of L LE mobility and scoot up in bed with use of overhead triangle.  Pt reported pain of 7/10 and had just received medication prior to treatment.  Pt able to complete sets of 10-20 of all there ex with VC's for proper form and manual assistance for ROM as needed.  She presented with weakness of quadriceps when attempting LAQ's and decreased ankle DF during ankle pumps.  PT educated pt concerning importance of focus on full ROM during there ex and pt was able to demonstrate understanding.  PT took time to discuss expectations for Dc to SNF and rehabilitation as well as pt's personal goals.  PT also assisted pt in repositioning in bed for greater comfort and decreased pain.  Pt is eager to work with PT to return to PLOF.  Pt will continue to benefit from skilled PT with focus on strength, ROM, functional mobility, ambulation and proper use of AD.   Follow Up Recommendations  SNF     Equipment Recommendations  3in1  (PT);Standard walker    Recommendations for Other Services       Precautions / Restrictions Restrictions Weight Bearing Restrictions: Yes LLE Weight Bearing: Weight bearing as tolerated Other Position/Activity Restrictions: WBAT with LLE, encourage mobility and strengthening     Mobility  Bed Mobility Overal bed mobility: Needs Assistance Bed Mobility: Supine to Sit;Sit to Supine Rolling: (pulls to R side with UE, unable to L due to pain)   Supine to sit: Min assist(heavy use of UE's with HOB raised and armrails, ) Sit to supine: Min assist(Min A to return LLE onto bed. )   General bed mobility comments: Assisted with mobility of LLE over EOB and scooting up in bed with overhead triangle.  Transfers Overall transfer level: (Did not perform per pt request.)                  Ambulation/Gait Ambulation/Gait assistance: (Did not perform per pt request.  Pt has walked to restroom several times today prior to therapy.)               Stairs             Wheelchair Mobility    Modified Rankin (Stroke Patients Only)       Balance Overall balance assessment: Needs assistance Sitting-balance support: Bilateral upper extremity supported;Feet supported Sitting balance-Leahy Scale: Fair Sitting balance - Comments: Able to scoot to EOB adn sit without assistance.  Cognition Arousal/Alertness: Awake/alert Behavior During Therapy: WFL for tasks assessed/performed Overall Cognitive Status: Within Functional Limits for tasks assessed                                 General Comments: Pt A&O x4, needs redirection at times to stay on topic.      Exercises Other Exercises Other Exercises: Discussed expectations for therapy at SNF and gave pt opportunity a to answer questions.    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 PT Goal Formulation: With patient Time For Goal Achievement: 08/24/17 Potential to Achieve Goals: Good Progress towards PT goals: Progressing toward goals    Frequency    BID      PT Plan Current plan remains appropriate    Co-evaluation              AM-PAC PT "6 Clicks" Daily Activity  Outcome Measure  Difficulty turning over in bed (including adjusting bedclothes, sheets and blankets)?: A Lot Difficulty moving from lying on back to sitting on the side of the bed? : A Lot Difficulty sitting down on and standing up from a chair with arms (e.g., wheelchair, bedside commode, etc,.)?: A Lot Help needed moving to and from a bed to chair (including a wheelchair)?: A Lot Help needed walking in hospital room?: A Lot Help needed climbing 3-5 steps with a railing? : A Lot 6 Click Score: 12    End of Session   Activity Tolerance: Patient limited by pain Patient left: in bed;with bed alarm set;with call bell/phone within reach;with SCD's reapplied   PT Visit Diagnosis: Muscle weakness (generalized) (M62.81);Pain Pain - Right/Left: Left Pain - part of body: Leg     Time: 1000-1030 PT Time Calculation (min) (ACUTE ONLY): 30 min  Charges:  $Therapeutic Exercise: 8-22 mins $Therapeutic Activity: 8-22 mins                    G Codes:  Functional Assessment Tool Used: AM-PAC 6 Clicks Basic Mobility    Glenetta HewSarah Carlisha Wisler, PT, DPT    Glenetta HewSarah Virgin Zellers 08/10/2017, 10:43 AM

## 2017-08-11 ENCOUNTER — Other Ambulatory Visit: Payer: Self-pay | Admitting: *Deleted

## 2017-08-11 ENCOUNTER — Other Ambulatory Visit: Payer: Self-pay

## 2017-08-11 ENCOUNTER — Other Ambulatory Visit
Admission: RE | Admit: 2017-08-11 | Discharge: 2017-08-11 | Disposition: A | Discharge status: Home / Self Care | Payer: 59 | Source: Ambulatory Visit | Attending: Gerontology | Admitting: Gerontology

## 2017-08-11 ENCOUNTER — Encounter: Payer: Self-pay | Admitting: Gerontology

## 2017-08-11 DIAGNOSIS — S72145D Nondisplaced intertrochanteric fracture of left femur, subsequent encounter for closed fracture with routine healing: Secondary | ICD-10-CM | POA: Diagnosis not present

## 2017-08-11 DIAGNOSIS — E440 Moderate protein-calorie malnutrition: Secondary | ICD-10-CM | POA: Diagnosis not present

## 2017-08-11 DIAGNOSIS — D649 Anemia, unspecified: Secondary | ICD-10-CM | POA: Insufficient documentation

## 2017-08-11 DIAGNOSIS — Z882 Allergy status to sulfonamides status: Secondary | ICD-10-CM | POA: Diagnosis not present

## 2017-08-11 DIAGNOSIS — I10 Essential (primary) hypertension: Secondary | ICD-10-CM | POA: Diagnosis not present

## 2017-08-11 DIAGNOSIS — S72142D Displaced intertrochanteric fracture of left femur, subsequent encounter for closed fracture with routine healing: Secondary | ICD-10-CM | POA: Diagnosis not present

## 2017-08-11 DIAGNOSIS — G9340 Encephalopathy, unspecified: Secondary | ICD-10-CM | POA: Diagnosis not present

## 2017-08-11 DIAGNOSIS — Z885 Allergy status to narcotic agent status: Secondary | ICD-10-CM | POA: Diagnosis not present

## 2017-08-11 DIAGNOSIS — X58XXXA Exposure to other specified factors, initial encounter: Secondary | ICD-10-CM | POA: Insufficient documentation

## 2017-08-11 LAB — COMPREHENSIVE METABOLIC PANEL
ALT: 16 U/L (ref 0–44)
ANION GAP: 7 (ref 5–15)
AST: 28 U/L (ref 15–41)
Albumin: 3.2 g/dL — ABNORMAL LOW (ref 3.5–5.0)
Alkaline Phosphatase: 64 U/L (ref 38–126)
BUN: 22 mg/dL (ref 8–23)
CHLORIDE: 102 mmol/L (ref 98–111)
CO2: 28 mmol/L (ref 22–32)
CREATININE: 0.52 mg/dL (ref 0.44–1.00)
Calcium: 8.1 mg/dL — ABNORMAL LOW (ref 8.9–10.3)
GFR calc non Af Amer: >60 mL/min (ref >60)
Glucose, Bld: 88 mg/dL (ref 70–99)
Potassium: 3.6 mmol/L (ref 3.5–5.1)
SODIUM: 137 mmol/L (ref 135–145)
Total Bilirubin: 0.8 mg/dL (ref 0.3–1.2)
Total Protein: 5.8 g/dL — ABNORMAL LOW (ref 6.5–8.1)

## 2017-08-11 LAB — CBC WITH DIFFERENTIAL/PLATELET
BASOS ABS: 0 10*3/uL (ref 0–0.1)
BASOS PCT: 0 %
Basophils Absolute: 0 10*3/uL (ref 0–0.1)
Basophils Relative: 0 %
EOS ABS: 0.1 10*3/uL (ref 0–0.7)
EOS PCT: 4 %
EOS PCT: 4 %
Eosinophils Absolute: 0.3 10*3/uL (ref 0–0.7)
HCT: 16.7 % — ABNORMAL LOW (ref 35.0–47.0)
HEMATOCRIT: 19.9 % — AB (ref 35.0–47.0)
Hemoglobin: 5.7 g/dL — ABNORMAL LOW (ref 12.0–16.0)
Hemoglobin: 6.8 g/dL — ABNORMAL LOW (ref 12.0–16.0)
LYMPHS ABS: 1.5 10*3/uL (ref 1.0–3.6)
LYMPHS PCT: 39 %
LYMPHS PCT: 30 %
Lymphs Abs: 2.1 10*3/uL (ref 1.0–3.6)
MCH: 31.3 pg (ref 26.0–34.0)
MCH: 31.9 pg (ref 26.0–34.0)
MCHC: 34.2 g/dL (ref 32.0–36.0)
MCHC: 34.3 g/dL (ref 32.0–36.0)
MCV: 91.5 fL (ref 80.0–100.0)
MCV: 92.9 fL (ref 80.0–100.0)
MONO ABS: 0.4 10*3/uL (ref 0.2–0.9)
MONO ABS: 0.5 10*3/uL (ref 0.2–0.9)
MONOS PCT: 10 %
Monocytes Relative: 7 %
NEUTROS ABS: 4.1 10*3/uL (ref 1.4–6.5)
Neutro Abs: 1.8 10*3/uL (ref 1.4–6.5)
Neutrophils Relative %: 47 %
Neutrophils Relative %: 59 %
PLATELETS: 136 10*3/uL — AB (ref 150–440)
PLATELETS: 228 10*3/uL (ref 150–440)
RBC: 1.82 MIL/uL — AB (ref 3.80–5.20)
RBC: 2.14 MIL/uL — ABNORMAL LOW (ref 3.80–5.20)
RDW: 14.1 % (ref 11.5–14.5)
RDW: 14.1 % (ref 11.5–14.5)
WBC: 3.8 10*3/uL (ref 3.6–11.0)
WBC: 7 10*3/uL (ref 3.6–11.0)

## 2017-08-11 LAB — TSH: TSH: 2.022 u[IU]/mL (ref 0.350–4.500)

## 2017-08-11 LAB — MAGNESIUM: MAGNESIUM: 1.7 mg/dL (ref 1.7–2.4)

## 2017-08-11 MED ORDER — OXYCODONE-ACETAMINOPHEN 5-325 MG PO TABS: 1.0000 | ORAL_TABLET | Freq: Four times a day (QID) | ORAL | 0 refills | Status: DC | PRN

## 2017-08-11 MED ORDER — MORPHINE SULFATE ER 60 MG PO TBCR: 60.0000 mg | EXTENDED_RELEASE_TABLET | Freq: Three times a day (TID) | ORAL | 0 refills | Status: DC

## 2017-08-11 NOTE — Patient Outreach (Addendum)
[facility]) Care Management  08/11/2017  Alison LingoConstance A Price [birth year] [id number]   Subjective: Telephone call to patient's home  / mobile number, no answer, left HIPAA compliant voicemail message, and requested call back.    Objective: Per KPN (Knowledge Performance Now, point of care tool) and chart review, patient hospitalized 08/05/17 -08/10/17 for Left hip fracture,due to mechanical fall, status post Intramedullary nailing of Left femur with cephalomedullary device on 08/06/17.    Patient also has a history of hypertension, anemia, arthritis,Osteoporosis, and Stevens-Johnson syndrome.   Patient discharged to skilled nursing facility [facility]) on 08/10/17.      Assessment: Received [facility] Transition of care referral on 08/07/17.  Transition of care follow up pending patient contact.     Plan: RNCM will send unsuccessful outreach  letter, [facility] pamphlet, will call patient for 2nd telephone outreach attempt, transition of care follow up, and proceed with case closure, within 10 business days if no return call.  RNCM will contact patient or skilled nursing facility to verify if placement is short term or long term within 10 business days.    Alison Price H. Gardiner Barefootooper RN, BSN, CCM [facility] Care Management [facility] Telephonic CM Phone: [phone] Fax: [phone]

## 2017-08-11 NOTE — Telephone Encounter (Signed)
Rx sent to [facility] phone : [phone] , fax : [phone]  

## 2017-08-12 ENCOUNTER — Ambulatory Visit
Admission: RE | Admit: 2017-08-12 | Discharge: 2017-08-12 | Disposition: A | Discharge status: Home / Self Care | Payer: 59 | Source: Ambulatory Visit | Attending: Gerontology | Admitting: Gerontology

## 2017-08-12 ENCOUNTER — Other Ambulatory Visit: Payer: Self-pay | Admitting: *Deleted

## 2017-08-12 ENCOUNTER — Encounter: Payer: Self-pay | Admitting: Gerontology

## 2017-08-12 DIAGNOSIS — D500 Iron deficiency anemia secondary to blood loss (chronic): Secondary | ICD-10-CM | POA: Diagnosis not present

## 2017-08-12 LAB — PREPARE RBC (CROSSMATCH)

## 2017-08-12 LAB — HEMOGLOBIN AND HEMATOCRIT, BLOOD
HCT: 18.2 % — ABNORMAL LOW (ref 35.0–47.0)
Hemoglobin: 6.3 g/dL — ABNORMAL LOW (ref 12.0–16.0)

## 2017-08-12 LAB — VITAMIN B12: Vitamin B-12: 980 pg/mL — ABNORMAL HIGH (ref 180–914)

## 2017-08-12 LAB — VITAMIN D 25 HYDROXY (VIT D DEFICIENCY, FRACTURES)
VIT D 25 HYDROXY: 18.2 ng/mL — AB (ref 30.0–100.0)
Vit D, 25-Hydroxy: 18.5 ng/mL — ABNORMAL LOW (ref 30.0–100.0)

## 2017-08-12 MED ORDER — SODIUM CHLORIDE 0.9% IV SOLUTION: Freq: Once | INTRAVENOUS | Status: DC

## 2017-08-12 NOTE — Progress Notes (Signed)
Opened in error; Disregard.

## 2017-08-12 NOTE — Patient Outreach (Signed)
[facility]) Care Management  08/12/2017  Alison LingoConstance A Price [birth year] [id number]   Subjective: Telephone call to patient's home / mobile number, spoke with patient, and HIPAA verified.  D[facility]ed [facility] Transition of care follow up, patient voiced understanding, and is in agreement to follow up.    Patient states she is currently at the hospital getting a blood transfusion, planning to return to rehab, and requested call back.     Objective: Per KPN (Knowledge Performance Now, point of care tool) and chart review, patient hospitalized 08/05/17 -08/10/17 for Left hip fracture,due tomechanical fall, status post Intramedullary nailing ofLeftfemur with cephalomedullary device on 08/06/17.    Patient also has a history of hypertension, anemia, arthritis,Osteoporosis, and Stevens-Johnson syndrome.   Patient discharged to skilled nursing facility [facility]) on 08/10/17.      Assessment: Received [facility] Transition of care referral on 08/07/17.  Transition of care follow up pending patient contact.     Plan: RNCM has sent unsuccessful outreach  letter, [facility] pamphlet, and will proceed with case closure, within 10 business days if no return call. RNCM will contact patient or skilled nursing facility to verify if placement is short term or long term within 10 business days.    Kersten Salmons H. Gardiner Barefootooper RN, BSN, CCM [facility] Care Management [facility] Telephonic CM Phone: [phone] Fax: [phone]

## 2017-08-13 ENCOUNTER — Other Ambulatory Visit
Admission: RE | Admit: 2017-08-13 | Discharge: 2017-08-13 | Disposition: A | Discharge status: Home / Self Care | Payer: 59 | Source: Other Acute Inpatient Hospital | Attending: Gerontology | Admitting: Gerontology

## 2017-08-13 ENCOUNTER — Other Ambulatory Visit
Admission: RE | Admit: 2017-08-13 | Discharge: 2017-08-13 | Disposition: A | Discharge status: Home / Self Care | Payer: 59 | Source: Ambulatory Visit | Attending: Gerontology | Admitting: Gerontology

## 2017-08-13 DIAGNOSIS — X58XXXD Exposure to other specified factors, subsequent encounter: Secondary | ICD-10-CM | POA: Insufficient documentation

## 2017-08-13 DIAGNOSIS — S72145D Nondisplaced intertrochanteric fracture of left femur, subsequent encounter for closed fracture with routine healing: Secondary | ICD-10-CM | POA: Insufficient documentation

## 2017-08-13 DIAGNOSIS — D649 Anemia, unspecified: Secondary | ICD-10-CM | POA: Insufficient documentation

## 2017-08-13 DIAGNOSIS — E559 Vitamin D deficiency, unspecified: Secondary | ICD-10-CM | POA: Diagnosis not present

## 2017-08-13 LAB — CBC WITH DIFFERENTIAL/PLATELET
BASOS ABS: 0 10*3/uL (ref 0–0.1)
BASOS PCT: 0 %
BASOS PCT: 0 %
Basophils Absolute: 0 10*3/uL (ref 0–0.1)
EOS ABS: 0.3 10*3/uL (ref 0–0.7)
EOS ABS: 0.3 10*3/uL (ref 0–0.7)
EOS PCT: 5 %
Eosinophils Relative: 5 %
HCT: 22.2 % — ABNORMAL LOW (ref 35.0–47.0)
HEMATOCRIT: 24.6 % — AB (ref 35.0–47.0)
HEMOGLOBIN: 7.9 g/dL — AB (ref 12.0–16.0)
HEMOGLOBIN: 9.1 g/dL — AB (ref 12.0–16.0)
Lymphocytes Relative: 24 %
Lymphocytes Relative: 25 %
Lymphs Abs: 1.4 10*3/uL (ref 1.0–3.6)
Lymphs Abs: 1.6 10*3/uL (ref 1.0–3.6)
MCH: 31.4 pg (ref 26.0–34.0)
MCH: 32.6 pg (ref 26.0–34.0)
MCHC: 35.4 g/dL (ref 32.0–36.0)
MCHC: 36.9 g/dL — ABNORMAL HIGH (ref 32.0–36.0)
MCV: 88.5 fL (ref 80.0–100.0)
MCV: 88.4 fL (ref 80.0–100.0)
MONO ABS: 0.5 10*3/uL (ref 0.2–0.9)
MONOS PCT: 7 %
Monocytes Absolute: 0.5 10*3/uL (ref 0.2–0.9)
Monocytes Relative: 9 %
NEUTROS PCT: 62 %
NEUTROS PCT: 63 %
Neutro Abs: 3.7 10*3/uL (ref 1.4–6.5)
Neutro Abs: 4 10*3/uL (ref 1.4–6.5)
Platelets: 208 10*3/uL (ref 150–440)
Platelets: 231 10*3/uL (ref 150–440)
RBC: 2.51 MIL/uL — AB (ref 3.80–5.20)
RBC: 2.78 MIL/uL — AB (ref 3.80–5.20)
RDW: 16.5 % — ABNORMAL HIGH (ref 11.5–14.5)
RDW: 16.3 % — ABNORMAL HIGH (ref 11.5–14.5)
WBC: 6 10*3/uL (ref 3.6–11.0)
WBC: 6.5 10*3/uL (ref 3.6–11.0)

## 2017-08-13 LAB — TYPE AND SCREEN
ABO/RH(D): A POS
ANTIBODY SCREEN: NEGATIVE
UNIT DIVISION: 0
UNIT DIVISION: 0

## 2017-08-13 LAB — COMPREHENSIVE METABOLIC PANEL
ALK PHOS: 64 U/L (ref 38–126)
ALT: 13 U/L (ref 0–44)
AST: 23 U/L (ref 15–41)
Albumin: 3.1 g/dL — ABNORMAL LOW (ref 3.5–5.0)
Anion gap: 7 (ref 5–15)
BUN: 18 mg/dL (ref 8–23)
CALCIUM: 8.1 mg/dL — AB (ref 8.9–10.3)
CO2: 28 mmol/L (ref 22–32)
CREATININE: 0.54 mg/dL (ref 0.44–1.00)
Chloride: 99 mmol/L (ref 98–111)
GFR calc non Af Amer: >60 mL/min (ref >60)
GLUCOSE: 98 mg/dL (ref 70–99)
Potassium: 4.1 mmol/L (ref 3.5–5.1)
SODIUM: 134 mmol/L — AB (ref 135–145)
Total Bilirubin: 1.3 mg/dL — ABNORMAL HIGH (ref 0.3–1.2)
Total Protein: 5.6 g/dL — ABNORMAL LOW (ref 6.5–8.1)

## 2017-08-13 LAB — BPAM RBC
BLOOD PRODUCT EXPIRATION DATE: 201907042359
Blood Product Expiration Date: 201907042359
ISSUE DATE / TIME: 201906261103
ISSUE DATE / TIME: 201906261307
UNIT TYPE AND RH: 600
Unit Type and Rh: 600

## 2017-08-14 ENCOUNTER — Other Ambulatory Visit: Payer: Self-pay | Admitting: *Deleted

## 2017-08-14 LAB — PARATHYROID HORMONE, INTACT (NO CA): PTH: 20 pg/mL (ref 15–65)

## 2017-08-14 NOTE — Patient Outreach (Signed)
[facility]) Care Management  08/14/2017  Ula LingoConstance A Mcfall 06/16/1945 [id number]   Subjective: Telephone call to patient's home  / mobile number, no answer, left HIPAA compliant voicemail message, and requested call back.    Objective:Per KPN (Knowledge Performance Now, point of care tool) and chart review,patient hospitalized 08/05/17 -08/10/17 forLeft hip fracture,due tomechanical fall, status postIntramedullary nailing ofLeftfemur with cephalomedullary deviceon 08/06/17. Patient also has a history of hypertension, anemia, arthritis,Osteoporosis, andStevens-Johnson syndrome.Patient discharged to skilled nursing facility [facility]) on 08/10/17.    Assessment:Received [facility] Transition of care referral on 08/07/17.Transition of care follow up pending patient contact.    Plan:RNCM has sent unsuccessful outreach letter, [facility] pamphlet, and will proceed with case closure, within 10 business days if no return call. RNCM will contact patient or skilled nursing facility to verify if placement is short term or long term within 10 business days.   Trinh Sanjose H. Gardiner Barefootooper RN, BSN, CCM [facility] Care Management [facility] Telephonic CM Phone: [phone] Fax: [phone]

## 2017-08-17 ENCOUNTER — Other Ambulatory Visit
Admission: RE | Admit: 2017-08-17 | Discharge: 2017-08-17 | Disposition: A | Discharge status: Home / Self Care | Payer: 59 | Source: Skilled Nursing Facility | Attending: Gerontology | Admitting: Gerontology

## 2017-08-17 ENCOUNTER — Encounter
Admission: RE | Admit: 2017-08-17 | Discharge: 2017-08-17 | Disposition: A | Discharge status: Home / Self Care | Payer: 59 | Source: Ambulatory Visit | Attending: Internal Medicine | Admitting: Internal Medicine

## 2017-08-17 DIAGNOSIS — X58XXXD Exposure to other specified factors, subsequent encounter: Secondary | ICD-10-CM | POA: Insufficient documentation

## 2017-08-17 DIAGNOSIS — S72145D Nondisplaced intertrochanteric fracture of left femur, subsequent encounter for closed fracture with routine healing: Secondary | ICD-10-CM | POA: Insufficient documentation

## 2017-08-17 DIAGNOSIS — D649 Anemia, unspecified: Secondary | ICD-10-CM | POA: Diagnosis not present

## 2017-08-17 DIAGNOSIS — K219 Gastro-esophageal reflux disease without esophagitis: Secondary | ICD-10-CM | POA: Diagnosis not present

## 2017-08-17 LAB — COMPREHENSIVE METABOLIC PANEL
ALK PHOS: 86 U/L (ref 38–126)
ALT: 11 U/L (ref 0–44)
AST: 23 U/L (ref 15–41)
Albumin: 3.4 g/dL — ABNORMAL LOW (ref 3.5–5.0)
Anion gap: 7 (ref 5–15)
BILIRUBIN TOTAL: 1.2 mg/dL (ref 0.3–1.2)
BUN: 25 mg/dL — AB (ref 8–23)
CALCIUM: 8.7 mg/dL — AB (ref 8.9–10.3)
CO2: 31 mmol/L (ref 22–32)
CREATININE: 0.79 mg/dL (ref 0.44–1.00)
Chloride: 98 mmol/L (ref 98–111)
GFR calc non Af Amer: >60 mL/min (ref >60)
GLUCOSE: 105 mg/dL — AB (ref 70–99)
Potassium: 4.3 mmol/L (ref 3.5–5.1)
Sodium: 136 mmol/L (ref 135–145)
TOTAL PROTEIN: 6 g/dL — AB (ref 6.5–8.1)

## 2017-08-17 LAB — CBC WITH DIFFERENTIAL/PLATELET
Basophils Absolute: 0 10*3/uL (ref 0–0.1)
Basophils Relative: 1 %
EOS ABS: 0.2 10*3/uL (ref 0–0.7)
Eosinophils Relative: 4 %
HEMATOCRIT: 22.5 % — AB (ref 35.0–47.0)
HEMOGLOBIN: 7.8 g/dL — AB (ref 12.0–16.0)
LYMPHS ABS: 1.3 10*3/uL (ref 1.0–3.6)
Lymphocytes Relative: 33 %
MCH: 31.4 pg (ref 26.0–34.0)
MCHC: 34.5 g/dL (ref 32.0–36.0)
MCV: 91 fL (ref 80.0–100.0)
MONOS PCT: 12 %
Monocytes Absolute: 0.5 10*3/uL (ref 0.2–0.9)
Neutro Abs: 2 10*3/uL (ref 1.4–6.5)
Neutrophils Relative %: 50 %
Platelets: 227 10*3/uL (ref 150–440)
RBC: 2.47 MIL/uL — ABNORMAL LOW (ref 3.80–5.20)
RDW: 16.4 % — ABNORMAL HIGH (ref 11.5–14.5)
WBC: 3.9 10*3/uL (ref 3.6–11.0)

## 2017-08-18 ENCOUNTER — Encounter: Payer: Self-pay | Admitting: Gerontology

## 2017-08-18 ENCOUNTER — Non-Acute Institutional Stay (SKILLED_NURSING_FACILITY): Payer: 59 | Admitting: Adult Health

## 2017-08-18 ENCOUNTER — Encounter: Payer: Self-pay | Admitting: Adult Health

## 2017-08-18 DIAGNOSIS — J309 Allergic rhinitis, unspecified: Secondary | ICD-10-CM

## 2017-08-18 DIAGNOSIS — F32A Depression, unspecified: Secondary | ICD-10-CM

## 2017-08-18 DIAGNOSIS — S72002S Fracture of unspecified part of neck of left femur, sequela: Secondary | ICD-10-CM | POA: Diagnosis not present

## 2017-08-18 DIAGNOSIS — F329 Major depressive disorder, single episode, unspecified: Secondary | ICD-10-CM

## 2017-08-18 DIAGNOSIS — D62 Acute posthemorrhagic anemia: Secondary | ICD-10-CM

## 2017-08-18 DIAGNOSIS — G8929 Other chronic pain: Secondary | ICD-10-CM

## 2017-08-18 NOTE — Progress Notes (Signed)
[facility]:  [facility] Room Number: 214A Place of Service:  SNF [phone]) Provider:  Kenard GowerMedina-Vargas, Lovette Merta, NP  Patient Care Team: Jaclyn Shaggyate, Denny C, MD as PCP - General (Internal Medicine) Shelda Palooper, Darlene H, RN as [facility] Care Management  Extended Emergency Contact Information Primary Emergency Contact: Corwin Levinsorley,Mark E Address: [address]          [place], [place] [address] [place] Home Phone: [phone] Relation: Son Secondary Emergency Contact: Ralene Muskratucker,Audrey  [place] Mobile Phone: [phone] Relation: Daughter  Code Status:  FULL CODE  Goals of care: Advanced Directive information Advanced Directives 08/18/2017  Does Patient Have a Medical Advance Directive? No  Would patient like information on creating a medical advance directive? No - Patient declined  Some encounter information is confidential and restricted. Go to Review Flowsheets activity to see all data.     Chief Complaint  Patient presents with  . Medical Management of Chronic Issues    Routine Visit    HPI:  Pt is a 72 y.o. female seen today for medical management of chronic diseases.  She has a PMH of Stevens-Johnson Syndrome, OP, HTN, arthritis, and anemia.  [facility] after a hospitalization [facility] for closed nondisplaced intertrochanteric fracture of the left femur. She was seen in her room today. She verbalized being sad about the recent passing of her son who died less than a year ago.    Past Medical History:  Diagnosis Date  . Anemia   . Arthritis   . Chronic pain   . GERD (gastroesophageal reflux disease)   . Hypertension   . Osteoporosis   . Stevens-Johnson syndrome [facility](HCC)    Past Surgical History:  Procedure Laterality Date  . ABDOMINAL HYSTERECTOMY     partial  . BACK SURGERY    . FOOT SURGERY    . HAND SURGERY     THUMB REPAIR  . HEMORRHOID SURGERY    . INTRAMEDULLARY (IM) NAIL  INTERTROCHANTERIC Left 08/06/2017   Procedure: INTRAMEDULLARY (IM) NAIL INTERTROCHANTRIC;  Surgeon: Signa KellPatel, Sunny, MD;  Location: [facility] ORS;  Service: Orthopedics;  Laterality: Left;  . POSTERIOR REPAIR      Allergies  Allergen Reactions  . Codeine Nausea And Vomiting  . Sulfa Antibiotics Other (See Comments)    Trudie BucklerSTEVEN JOHNSON SYNDROME    Outpatient Encounter Medications as of 08/18/2017  Medication Sig  . alum & mag hydroxide-simeth (MAALOX PLUS) 400-400-40 MG/5ML suspension Take 30 mLs by mouth every 4 (four) hours as needed.  Marland Kitchen. aspirin EC 81 MG tablet Take 81 mg by mouth daily.  . butalbital-acetaminophen-caffeine (FIORICET, ESGIC) 50-325-40 MG tablet Take 1 tablet by mouth every 6 (six) hours as needed for headache or migraine.  . Cholecalciferol (VITAMIN D) 2000 units CAPS Take 3 capsules by mouth daily. 6000 units total  . Choline Fenofibrate (FENOFIBRIC ACID) 135 MG CPDR Take 135 mg by mouth daily.   . Choline Fenofibrate (FENOFIBRIC ACID) 135 MG CPDR Take 1 tablet by mouth daily.  Marland Kitchen. conjugated estrogens (PREMARIN) vaginal cream Place 1 Applicatorful vaginally See admin instructions. Daily on Sunday and Wednesday  . dimenhyDRINATE (DRAMAMINE) 50 MG tablet Take 50 mg by mouth every 8 (eight) hours as needed. N/V (usually takes with Morphine and oxycodone)  . enoxaparin (LOVENOX) 40 MG/0.4ML injection Inject 0.4 mLs (40 mg total) into the skin daily for 13 days.  Marland Kitchen. etodolac (LODINE) 400 MG tablet Take 400 mg by mouth 2 (  two) times daily.   . ferrous sulfate 325 (65 FE) MG tablet Take 325 mg by mouth 3 (three) times daily with meals.  . fexofenadine (ALLEGRA) 180 MG tablet Take 180 mg by mouth daily.  . Fluticasone-Salmeterol (ADVAIR) 250-50 MCG/DOSE AEPB Inhale 1 puff into the lungs 2 (two) times daily.  . Ipratropium-Albuterol (COMBIVENT RESPIMAT) 20-100 MCG/ACT AERS respimat Inhale 2 puffs into the lungs every 6 (six) hours as needed for wheezing or shortness of breath.  . meclizine  (ANTIVERT) 25 MG tablet Take 25 mg by mouth 2 (two) times daily as needed for dizziness.  Marland Kitchen morphine (MS CONTIN) 60 MG 12 hr tablet Take 1 tablet (60 mg total) by mouth 3 (three) times daily.  . Multiple Vitamin (MULTIVITAMIN) tablet Take 1 tablet by mouth daily.  . ondansetron (ZOFRAN) 4 MG tablet Take 1 tablet (4 mg total) by mouth every 8 (eight) hours as needed for nausea or vomiting.  Marland Kitchen oxyCODONE-acetaminophen (PERCOCET/ROXICET) 5-325 MG tablet Take 1 tablet by mouth every 4 (four) hours as needed for moderate pain or severe pain.  . pantoprazole (PROTONIX) 40 MG tablet Take 40 mg by mouth daily.   Marland Kitchen tiZANidine (ZANAFLEX) 4 MG tablet Take 4 mg by mouth every 6 (six) hours as needed for muscle spasms.   Marland Kitchen venlafaxine (EFFEXOR) 75 MG tablet Take 75 mg by mouth daily.  . vitamin C (ASCORBIC ACID) 500 MG tablet Take 500 mg by mouth 3 (three) times daily. anemia- give with ferrous sulfate to potentiate iron absorption  . [DISCONTINUED] Cholecalciferol 4000 units CAPS Take 1 capsule by mouth daily.  . [DISCONTINUED] fexofenadine (ALLEGRA) 180 MG tablet Take 180 mg by mouth daily.    No facility-administered encounter medications on file as of 08/18/2017.     Review of Systems  GENERAL: No change in appetite, no fatigue, no weight changes, no fever, chills or weakness MOUTH and THROAT: Denies oral discomfort, gingival pain or bleeding RESPIRATORY: no cough, SOB, DOE, wheezing, hemoptysis CARDIAC: No chest pain, edema or palpitations GI: No abdominal pain, diarrhea, constipation, heart burn, nausea or vomiting GU: Denies dysuria, frequency, hematuria, incontinence, or discharge PSYCHIATRIC:+ depression   Immunization History  Administered Date(s) Administered  . Pneumococcal Polysaccharide-23 10/25/2016   Pertinent  Health Maintenance Due  Topic Date Due  . COLONOSCOPY  03/02/1996  . DEXA SCAN  03/03/2011  . MAMMOGRAM  09/06/2017  . INFLUENZA VACCINE  09/17/2017  . PNA vac Low Risk  Adult (2 of 2 - PCV13) 10/25/2017      Vitals:   08/18/17 1128  BP: (!) 118/58  Pulse: 78  Resp: 18  Temp: 98.7 F (37.1 C)  TempSrc: Oral  SpO2: 95%  Weight: 148 lb 8 oz (67.4 kg)  Height: 5\' 8"  (1.727 m)   Body mass index is 22.58 kg/m.  Physical Exam  GENERAL APPEARANCE: Well nourished. In no acute distress. Normal body habitus SKIN:  Left hip surgical site covered with honeycombed dressing, dry and no erythema MOUTH and THROAT: Lips are without lesions. Oral mucosa is moist and without lesions. Tongue is normal in shape, size, and color and without lesions RESPIRATORY: Breathing is even & unlabored, BS CTAB CARDIAC: RRR, no murmur,no extra heart sounds, no edema GI: Abdomen soft, normal BS, no masses, no tenderness EXTREMITIES:  Able to move X 4 extremities PSYCHIATRIC: Alert and oriented X 3. Affect and behavior are appropriate   Labs reviewed: Recent Labs    08/08/17 0617 08/09/17 0350 08/11/17 0545 08/13/17 0600 08/17/17 1610  NA 137 139 137 134* 136  K 3.0* 3.1* 3.6 4.1 4.3  CL 102 105 102 99 98  CO2 24 25 28 28 31   GLUCOSE 123* 110* 88 98 105*  BUN 21* 23* 22 18 25*  CREATININE 0.57 0.62 0.52 0.54 0.79  CALCIUM 8.2* 8.4* 8.1* 8.1* 8.7*  MG 1.6* 1.7 1.7  --   --    Recent Labs    08/11/17 0545 08/13/17 0600 08/17/17 0605  AST 28 23 23   ALT 16 13 11   ALKPHOS 64 64 86  BILITOT 0.8 1.3* 1.2  PROT 5.8* 5.6* 6.0*  ALBUMIN 3.2* 3.1* 3.4*   Recent Labs    08/13/17 0600 08/13/17 1733 08/17/17 0605  WBC 6.0 6.5 3.9  NEUTROABS 3.7 4.0 2.0  HGB 7.9* 9.1* 7.8*  HCT 22.2* 24.6* 22.5*  MCV 88.5 88.4 91.0  PLT 208 231 227   Lab Results  Component Value Date   TSH 2.022 08/11/2017   Lab Results  Component Value Date   HGBA1C 5.6 04/19/2011   Significant Diagnostic Results in last 30 days:  Ct Head Wo Contrast  Result Date: 08/07/2017 CLINICAL DATA:  Altered mental status.  History of hypertension. EXAM: CT HEAD WITHOUT CONTRAST TECHNIQUE:  Contiguous axial images were obtained from the base of the skull through the vertex without intravenous contrast. COMPARISON:  CT HEAD October 30, 2017 FINDINGS: BRAIN: No intraparenchymal hemorrhage, mass effect nor midline shift. The ventricles and sulci are normal for age. Patchy supratentorial white matter hypodensities. No acute large vascular territory infarcts. No abnormal extra-axial fluid collections. Basal cisterns are patent. VASCULAR: Mild calcific atherosclerosis of the carotid siphons. SKULL: No skull fracture. No significant scalp soft tissue swelling. SINUSES/ORBITS: The mastoid air-cells and included paranasal sinuses are well-aerated.The included ocular globes and orbital contents are non-suspicious. Status post bilateral ocular lens implants. OTHER: None. IMPRESSION: 1. No acute intracranial process. 2. Stable moderate chronic small vessel ischemic changes. Electronically Signed   By: Awilda Metro M.D.   On: 08/07/2017 17:47   Dg Chest Port 1 View  Result Date: 08/05/2017 CLINICAL DATA:  Larey Seat in store.  Suspect femur fracture. EXAM: PORTABLE CHEST 1 VIEW COMPARISON:  Chest radiograph March 02, 2017 FINDINGS: Cardiac silhouette is mildly enlarged, accentuated by rotation to the LEFT. Stable chronic interstitial changes with LEFT lung base scarring. No pleural effusion or focal consolidation. No pneumothorax. Osteopenia. IMPRESSION: Mild cardiomegaly and chronic interstitial changes. Electronically Signed   By: Awilda Metro M.D.   On: 08/05/2017 21:07   Dg Hip Operative Unilat W Or W/o Pelvis Left  Result Date: 08/06/2017 CLINICAL DATA:  IM nailing of left hip fracture EXAM: OPERATIVE left HIP (WITH PELVIS IF PERFORMED) 7 VIEWS TECHNIQUE: Fluoroscopic spot image(s) were submitted for interpretation post-operatively. COMPARISON:  Pelvis on left hip films of 08/05/2017 FINDINGS: Seven C-arm spot films were returned. These images show the left intertrochanteric femoral fracture. I  am nail and fixation screw replaced for fixation. No complicating features are seen. IMPRESSION: IM nailing left intertrochanteric femoral fracture. Electronically Signed   By: Dwyane Dee M.D.   On: 08/06/2017 14:50   Dg Hip Unilat With Pelvis 2-3 Views Left  Result Date: 08/05/2017 CLINICAL DATA:  Larey Seat in store.  Suspect hip fracture. EXAM: DG HIP (WITH OR WITHOUT PELVIS) 2-3V LEFT COMPARISON:  None. FINDINGS: Acute nondisplaced LEFT femur femoral neck to greater trochanter. No dislocation. No destructive bony lesions. Humeral heads are located. Osteopenia. Soft tissue planes are non suspicious. Lower lumbar disc  prosthesis. IMPRESSION: Acute nondisplaced LEFT femur fracture extending through the greater trochanter. Electronically Signed   By: Awilda Metro M.D.   On: 08/05/2017 21:06    Assessment/Plan  1. Closed fracture of left hip, sequela - S/P surgical repair on 6/20, continue rehabilitation with PT and OT, Lovenox 40 mg SQ daily for DVT prophylaxis, Tizanidine 4 mg Q 6 hours PRN for muscle spasm, Percocet 5-325 mg 1 tab Q 4 hours PRN for pain   2. Allergic rhinitis, unspecified seasonality, unspecified trigger - continue Fexofenadine 180 mg daily   3. Anemia associated with acute blood loss - FeSO4 325 mg TID Lab Results  Component Value Date   HGB 7.6 (L) 08/19/2017     4. Chronic depression - talks about son who recently died, continue Venlafaxine 24HR 75 mg daily   5. Other chronic pain - well-controlled, continue Morphine ER 60 mg TID and Percocet 5-325 mg 1 tab Q 4 hours PRN for pain      Family/ staff Communication: Discussed plan of care with patient.  Labs/tests ordered:  None  Goals of care:   Short-term care   Kenard Gower, NP [facility] and [facility] [phone] (Monday-Friday 8:00 a.m. - 5:00 p.m.) [phone] (after hours)

## 2017-08-18 NOTE — Progress Notes (Signed)
Opened in error; Disregard.

## 2017-08-19 ENCOUNTER — Other Ambulatory Visit
Admission: RE | Admit: 2017-08-19 | Discharge: 2017-08-19 | Disposition: A | Discharge status: Home / Self Care | Payer: 59 | Source: Other Acute Inpatient Hospital | Attending: Gerontology | Admitting: Gerontology

## 2017-08-19 ENCOUNTER — Other Ambulatory Visit: Payer: Self-pay

## 2017-08-19 DIAGNOSIS — S72145D Nondisplaced intertrochanteric fracture of left femur, subsequent encounter for closed fracture with routine healing: Secondary | ICD-10-CM | POA: Diagnosis not present

## 2017-08-19 DIAGNOSIS — D649 Anemia, unspecified: Secondary | ICD-10-CM | POA: Diagnosis not present

## 2017-08-19 DIAGNOSIS — R262 Difficulty in walking, not elsewhere classified: Secondary | ICD-10-CM | POA: Diagnosis not present

## 2017-08-19 DIAGNOSIS — E559 Vitamin D deficiency, unspecified: Secondary | ICD-10-CM | POA: Diagnosis not present

## 2017-08-19 DIAGNOSIS — M25552 Pain in left hip: Secondary | ICD-10-CM | POA: Diagnosis not present

## 2017-08-19 DIAGNOSIS — M479 Spondylosis, unspecified: Secondary | ICD-10-CM | POA: Diagnosis not present

## 2017-08-19 DIAGNOSIS — K219 Gastro-esophageal reflux disease without esophagitis: Secondary | ICD-10-CM | POA: Diagnosis not present

## 2017-08-19 DIAGNOSIS — F339 Major depressive disorder, recurrent, unspecified: Secondary | ICD-10-CM | POA: Diagnosis not present

## 2017-08-19 DIAGNOSIS — R41841 Cognitive communication deficit: Secondary | ICD-10-CM | POA: Diagnosis not present

## 2017-08-19 DIAGNOSIS — D62 Acute posthemorrhagic anemia: Secondary | ICD-10-CM | POA: Diagnosis not present

## 2017-08-19 DIAGNOSIS — M810 Age-related osteoporosis without current pathological fracture: Secondary | ICD-10-CM | POA: Diagnosis not present

## 2017-08-19 DIAGNOSIS — X58XXXD Exposure to other specified factors, subsequent encounter: Secondary | ICD-10-CM | POA: Diagnosis not present

## 2017-08-19 DIAGNOSIS — G8929 Other chronic pain: Secondary | ICD-10-CM | POA: Diagnosis not present

## 2017-08-19 DIAGNOSIS — M6281 Muscle weakness (generalized): Secondary | ICD-10-CM | POA: Diagnosis not present

## 2017-08-19 DIAGNOSIS — S72002S Fracture of unspecified part of neck of left femur, sequela: Secondary | ICD-10-CM | POA: Diagnosis not present

## 2017-08-19 LAB — COMPREHENSIVE METABOLIC PANEL
ALBUMIN: 3.3 g/dL — AB (ref 3.5–5.0)
ALK PHOS: 90 U/L (ref 38–126)
ALT: 12 U/L (ref 0–44)
ANION GAP: 6 (ref 5–15)
AST: 23 U/L (ref 15–41)
BILIRUBIN TOTAL: 1.2 mg/dL (ref 0.3–1.2)
BUN: 33 mg/dL — ABNORMAL HIGH (ref 8–23)
CALCIUM: 8.4 mg/dL — AB (ref 8.9–10.3)
CO2: 28 mmol/L (ref 22–32)
Chloride: 101 mmol/L (ref 98–111)
Creatinine, Ser: 0.91 mg/dL (ref 0.44–1.00)
GFR calc Af Amer: >60 mL/min (ref >60)
GFR calc non Af Amer: >60 mL/min (ref >60)
GLUCOSE: 94 mg/dL (ref 70–99)
Potassium: 4.6 mmol/L (ref 3.5–5.1)
Sodium: 135 mmol/L (ref 135–145)
TOTAL PROTEIN: 6.1 g/dL — AB (ref 6.5–8.1)

## 2017-08-19 LAB — CBC WITH DIFFERENTIAL/PLATELET
Basophils Absolute: 0 10*3/uL (ref 0–0.1)
Basophils Relative: 1 %
Eosinophils Absolute: 0.2 10*3/uL (ref 0–0.7)
Eosinophils Relative: 4 %
HEMATOCRIT: 22.4 % — AB (ref 35.0–47.0)
Hemoglobin: 7.6 g/dL — ABNORMAL LOW (ref 12.0–16.0)
LYMPHS ABS: 1 10*3/uL (ref 1.0–3.6)
LYMPHS PCT: 24 %
MCH: 31 pg (ref 26.0–34.0)
MCHC: 33.7 g/dL (ref 32.0–36.0)
MCV: 92 fL (ref 80.0–100.0)
MONOS PCT: 10 %
Monocytes Absolute: 0.4 10*3/uL (ref 0.2–0.9)
NEUTROS ABS: 2.6 10*3/uL (ref 1.4–6.5)
NEUTROS PCT: 61 %
Platelets: 214 10*3/uL (ref 150–440)
RBC: 2.44 MIL/uL — ABNORMAL LOW (ref 3.80–5.20)
RDW: 16.3 % — ABNORMAL HIGH (ref 11.5–14.5)
WBC: 4.2 10*3/uL (ref 3.6–11.0)

## 2017-08-19 MED ORDER — MORPHINE SULFATE ER 60 MG PO TBCR: 60.0000 mg | EXTENDED_RELEASE_TABLET | Freq: Three times a day (TID) | ORAL | 0 refills | Status: DC

## 2017-08-19 NOTE — Telephone Encounter (Signed)
Rx sent to [facility] phone : [phone] , fax : [phone]  

## 2017-08-26 ENCOUNTER — Non-Acute Institutional Stay (SKILLED_NURSING_FACILITY): Payer: 59 | Admitting: Adult Health

## 2017-08-26 ENCOUNTER — Other Ambulatory Visit: Payer: Self-pay | Admitting: *Deleted

## 2017-08-26 ENCOUNTER — Encounter: Payer: Self-pay | Admitting: Adult Health

## 2017-08-26 DIAGNOSIS — S72002S Fracture of unspecified part of neck of left femur, sequela: Secondary | ICD-10-CM | POA: Diagnosis not present

## 2017-08-26 DIAGNOSIS — F339 Major depressive disorder, recurrent, unspecified: Secondary | ICD-10-CM

## 2017-08-26 DIAGNOSIS — K219 Gastro-esophageal reflux disease without esophagitis: Secondary | ICD-10-CM

## 2017-08-26 DIAGNOSIS — D62 Acute posthemorrhagic anemia: Secondary | ICD-10-CM

## 2017-08-26 DIAGNOSIS — M25552 Pain in left hip: Secondary | ICD-10-CM | POA: Diagnosis not present

## 2017-08-26 DIAGNOSIS — G8929 Other chronic pain: Secondary | ICD-10-CM

## 2017-08-26 NOTE — Progress Notes (Signed)
Location:  [facility] Room Number: 214A Place of Service:  SNF ([phone]) Provider:  Kenard Gower, NP  Patient Care Team: Jaclyn Shaggy, MD as PCP - General (Internal Medicine) Shelda Pal, RN as [facility] Care Management  Extended Emergency Contact Information Primary Emergency Contact: Corwin Levins Address: [address]          [place], [place] [address] [place] Home Phone: [phone] Relation: Son Secondary Emergency Contact: Ralene [place] Mobile Phone: [phone] Relation: Daughter  Code Status:  FULL  Goals of care: Advanced Directive information Advanced Directives 08/26/2017  Does Patient Have a Medical Advance Directive? No  Would patient like information on creating a medical advance directive? No - Patient declined  Some encounter information is confidential and restricted. Go to Review Flowsheets activity to see all data.     Chief Complaint  Patient presents with  . Discharge Note    Discharged from SNF    HPI:  Pt is a 72 y.o. female seen today for a discharge visit.  She is to discharge home on 08/28/17.   She has been [facility]17 after being hospitalized at [facility] for a closed nondisplaced intertrochanteric fracture of the left femur for which she had intramedullary nailing of left femur with cephalomedullary device on 08/06/17. She has a PMH of essential hypertension, anxiety/depression, GERD, postmenopausal atrophic vaginitis, and spondylosis.    Patient was admitted to this facility for short-term rehabilitation after the patient's recent hospitalization.  Patient has completed SNF rehabilitation and therapy has cleared the patient for discharge.   Past Medical History:  Diagnosis Date  . Anemia   . Arthritis   . Chronic pain   . GERD (gastroesophageal reflux disease)   . Hypertension   . Osteoporosis   .  Stevens-Johnson syndrome [facility])    Past Surgical History:  Procedure Laterality Date  . ABDOMINAL HYSTERECTOMY     partial  . BACK SURGERY    . FOOT SURGERY    . HAND SURGERY     THUMB REPAIR  . HEMORRHOID SURGERY    . INTRAMEDULLARY (IM) NAIL INTERTROCHANTERIC Left 08/06/2017   Procedure: INTRAMEDULLARY (IM) NAIL INTERTROCHANTRIC;  Surgeon: Signa Kell, MD;  Location: [facility] ORS;  Service: Orthopedics;  Laterality: Left;  . POSTERIOR REPAIR      Allergies  Allergen Reactions  . Codeine Nausea And Vomiting  . Sulfa Antibiotics Other (See Comments)    Trudie Buckler SYNDROME    Outpatient Encounter Medications as of 08/26/2017  Medication Sig  . alum & mag hydroxide-simeth (MAALOX PLUS) 400-400-40 MG/5ML suspension Take 30 mLs by mouth every 4 (four) hours as needed.  Marland Kitchen aspirin EC 81 MG tablet Take 81 mg by mouth daily.  . butalbital-acetaminophen-caffeine (FIORICET, ESGIC) 50-325-40 MG tablet Take 1 tablet by mouth every 6 (six) hours as needed for headache or migraine.  . Cholecalciferol (VITAMIN D) 2000 units CAPS Take 3 capsules by mouth daily. 6000 units total  . Choline Fenofibrate (FENOFIBRIC ACID) 135 MG CPDR Take 135 mg by mouth daily.   Marland Kitchen conjugated estrogens (PREMARIN) vaginal cream Place 1 Applicatorful vaginally See admin instructions. Daily on Sunday and Wednesday  . dimenhyDRINATE (DRAMAMINE) 50 MG tablet Take 50 mg by mouth every 8 (eight) hours as needed. N/V (usually takes with Morphine and oxycodone)  . etodolac (LODINE) 400 MG tablet Take 400 mg by mouth 2 (two) times daily.   Marland Kitchen  ferrous sulfate 325 (65 FE) MG tablet Take 325 mg by mouth 3 (three) times daily with meals.  . fexofenadine (ALLEGRA) 180 MG tablet Take 180 mg by mouth daily.  . Fluticasone-Salmeterol (ADVAIR) 250-50 MCG/DOSE AEPB Inhale 1 puff into the lungs 2 (two) times daily.  . Ipratropium-Albuterol (COMBIVENT RESPIMAT) 20-100 MCG/ACT AERS respimat Inhale 2 puffs into the lungs every 6 (six) hours as  needed for wheezing or shortness of breath.  . meclizine (ANTIVERT) 25 MG tablet Take 25 mg by mouth 2 (two) times daily as needed for dizziness.  Marland Kitchen morphine (MS CONTIN) 60 MG 12 hr tablet Take 1 tablet (60 mg total) by mouth 3 (three) times daily.  . Multiple Vitamin (MULTIVITAMIN) tablet Take 1 tablet by mouth daily.  . ondansetron (ZOFRAN) 4 MG tablet Take 1 tablet (4 mg total) by mouth every 8 (eight) hours as needed for nausea or vomiting.  Marland Kitchen oxyCODONE-acetaminophen (PERCOCET/ROXICET) 5-325 MG tablet Take 1 tablet by mouth every 4 (four) hours as needed for moderate pain or severe pain.  . pantoprazole (PROTONIX) 40 MG tablet Take 40 mg by mouth daily.   Marland Kitchen tiZANidine (ZANAFLEX) 4 MG tablet Take 4 mg by mouth every 6 (six) hours as needed for muscle spasms.   Marland Kitchen venlafaxine (EFFEXOR) 75 MG tablet Take 75 mg by mouth daily.  . vitamin C (ASCORBIC ACID) 500 MG tablet Take 500 mg by mouth 3 (three) times daily. anemia- give with ferrous sulfate to potentiate iron absorption  . [DISCONTINUED] Choline Fenofibrate (FENOFIBRIC ACID) 135 MG CPDR Take 1 tablet by mouth daily.  . [DISCONTINUED] enoxaparin (LOVENOX) 40 MG/0.4ML injection Inject 0.4 mLs (40 mg total) into the skin daily for 13 days.   No facility-administered encounter medications on file as of 08/26/2017.     Review of Systems  GENERAL: No change in appetite, no fatigue, no weight changes, no fever, chills or weakness MOUTH and THROAT: Denies oral discomfort, gingival pain or bleeding RESPIRATORY: no cough, SOB, DOE, wheezing, hemoptysis CARDIAC: No chest pain, edema or palpitations GI: No abdominal pain, diarrhea, constipation, heart burn, nausea or vomiting GU: Denies dysuria, frequency, hematuria, incontinence, or discharge PSYCHIATRIC: Denies feelings of depression or anxiety. No report of hallucinations, insomnia, paranoia, or agitation   Immunization History  Administered Date(s) Administered  . Pneumococcal  Polysaccharide-23 10/25/2016   Pertinent  Health Maintenance Due  Topic Date Due  . COLONOSCOPY  03/02/1996  . DEXA SCAN  03/03/2011  . MAMMOGRAM  09/06/2017  . INFLUENZA VACCINE  09/17/2017  . PNA vac Low Risk Adult (2 of 2 - PCV13) 10/25/2017      Vitals:   08/26/17 1005  BP: 113/71  Pulse: 90  Resp: 20  Temp: 98.1 F (36.7 C)  TempSrc: Oral  SpO2: 96%  Weight: 135 lb 9.6 oz (61.5 kg)  Height: 5\' 8"  (1.727 m)   Body mass index is 20.62 kg/m.  Physical Exam  GENERAL APPEARANCE: Well nourished. In no acute distress. Normal body habitus SKIN:  Left hip surgical incision has steri-strips, dry and no erythema MOUTH and THROAT: Lips are without lesions. Oral mucosa is moist and without lesions. Tongue is normal in shape, size, and color and without lesions RESPIRATORY: Breathing is even & unlabored, BS CTAB CARDIAC: RRR, no murmur,no extra heart sounds, no edema GI: Abdomen soft, normal BS, no masses, no tenderness EXTREMITIES:  Able to move X 4 extremities PSYCHIATRIC: Alert and oriented X 3. Affect and behavior are appropriate  Labs reviewed: Recent Labs  08/08/17 0617 08/09/17 0350 08/11/17 0545 08/13/17 0600 08/17/17 0605 08/19/17 0610  NA 137 139 137 134* 136 135  K 3.0* 3.1* 3.6 4.1 4.3 4.6  CL 102 105 102 99 98 101  CO2 24 25 28 28 31 28   GLUCOSE 123* 110* 88 98 105* 94  BUN 21* 23* 22 18 25* 33*  CREATININE 0.57 0.62 0.52 0.54 0.79 0.91  CALCIUM 8.2* 8.4* 8.1* 8.1* 8.7* 8.4*  MG 1.6* 1.7 1.7  --   --   --    Recent Labs    08/13/17 0600 08/17/17 0605 08/19/17 0610  AST 23 23 23   ALT 13 11 12   ALKPHOS 64 86 90  BILITOT 1.3* 1.2 1.2  PROT 5.6* 6.0* 6.1*  ALBUMIN 3.1* 3.4* 3.3*   Recent Labs    08/13/17 1733 08/17/17 0605 08/19/17 0610  WBC 6.5 3.9 4.2  NEUTROABS 4.0 2.0 2.6  HGB 9.1* 7.8* 7.6*  HCT 24.6* 22.5* 22.4*  MCV 88.4 91.0 92.0  PLT 231 227 214   Lab Results  Component Value Date   TSH 2.022 08/11/2017   Lab Results    Component Value Date   HGBA1C 5.6 04/19/2011    Significant Diagnostic Results in last 30 days:  Ct Head Wo Contrast  Result Date: 08/07/2017 CLINICAL DATA:  Altered mental status.  History of hypertension. EXAM: CT HEAD WITHOUT CONTRAST TECHNIQUE: Contiguous axial images were obtained from the base of the skull through the vertex without intravenous contrast. COMPARISON:  CT HEAD October 30, 2017 FINDINGS: BRAIN: No intraparenchymal hemorrhage, mass effect nor midline shift. The ventricles and sulci are normal for age. Patchy supratentorial white matter hypodensities. No acute large vascular territory infarcts. No abnormal extra-axial fluid collections. Basal cisterns are patent. VASCULAR: Mild calcific atherosclerosis of the carotid siphons. SKULL: No skull fracture. No significant scalp soft tissue swelling. SINUSES/ORBITS: The mastoid air-cells and included paranasal sinuses are well-aerated.The included ocular globes and orbital contents are non-suspicious. Status post bilateral ocular lens implants. OTHER: None. IMPRESSION: 1. No acute intracranial process. 2. Stable moderate chronic small vessel ischemic changes. Electronically Signed   By: Awilda Metroourtnay  Bloomer M.D.   On: 08/07/2017 17:47   Dg Chest Port 1 View  Result Date: 08/05/2017 CLINICAL DATA:  Larey SeatFell in store.  Suspect femur fracture. EXAM: PORTABLE CHEST 1 VIEW COMPARISON:  Chest radiograph March 02, 2017 FINDINGS: Cardiac silhouette is mildly enlarged, accentuated by rotation to the LEFT. Stable chronic interstitial changes with LEFT lung base scarring. No pleural effusion or focal consolidation. No pneumothorax. Osteopenia. IMPRESSION: Mild cardiomegaly and chronic interstitial changes. Electronically Signed   By: Awilda Metroourtnay  Bloomer M.D.   On: 08/05/2017 21:07   Dg Hip Operative Unilat W Or W/o Pelvis Left  Result Date: 08/06/2017 CLINICAL DATA:  IM nailing of left hip fracture EXAM: OPERATIVE left HIP (WITH PELVIS IF PERFORMED) 7  VIEWS TECHNIQUE: Fluoroscopic spot image(s) were submitted for interpretation post-operatively. COMPARISON:  Pelvis on left hip films of 08/05/2017 FINDINGS: Seven C-arm spot films were returned. These images show the left intertrochanteric femoral fracture. I am nail and fixation screw replaced for fixation. No complicating features are seen. IMPRESSION: IM nailing left intertrochanteric femoral fracture. Electronically Signed   By: Dwyane DeePaul  Barry M.D.   On: 08/06/2017 14:50   Dg Hip Unilat With Pelvis 2-3 Views Left  Result Date: 08/05/2017 CLINICAL DATA:  Larey SeatFell in store.  Suspect hip fracture. EXAM: DG HIP (WITH OR WITHOUT PELVIS) 2-3V LEFT COMPARISON:  None. FINDINGS: Acute nondisplaced LEFT  femur femoral neck to greater trochanter. No dislocation. No destructive bony lesions. Humeral heads are located. Osteopenia. Soft tissue planes are non suspicious. Lower lumbar disc prosthesis. IMPRESSION: Acute nondisplaced LEFT femur fracture extending through the greater trochanter. Electronically Signed   By: Awilda Metro M.D.   On: 08/05/2017 21:06    Assessment/Plan  1. Closed fracture of left hip, sequela - S/P intramedullary nail, will follow up with orthopedics, continue Tizanidine 4 mg 1 capsule Q 6 hours PRN for muscle spasm, Percocet 5-325 mg 1 tab Q 4 hours PRN and Morphine ER 60 mg TID for pain   2. Anemia due to acute blood loss - continue FeSO4 325 mg 1 tab TID Lab Results  Component Value Date   HGB 7.6 (L) 08/19/2017     3. Other chronic pain - well-controlled, continue Morphine 60 mg ER 1 tab TID and Percocet 5-325 mg 1 tab Q 4 hours PRN    4. Gastroesophageal reflux disease, esophagitis presence not specified - continue Pantoprazole DR 40 mg 1 tab daily   5. Depression, recurrent (HCC) - mood is stable, continue Venlafaxine ER 24 HR 75 mg 1 capsule daily     I have filled out patient's discharge paperwork and written prescriptions.    DME provided:  Rolling walker with a  basket.    Total discharge time: Greater than 30 minutes Greater than 50% was spent in counseling and coordination of care.   Discharge time involved coordination of the discharge process with social worker, nursing staff and therapy department. Medical justification for DME verified.    Kenard Gower, NP [facility] and Adult Medicine [phone] (Monday-Friday 8:00 a.m. - 5:00 p.m.) [phone] (after hours)

## 2017-08-26 NOTE — Patient Outreach (Signed)
[facility] Care Management  08/26/2017  Alison LingoConstance A Price [birth year] [id number]   No response from patient outreach attempts will proceed with case closure.     Objective:Per KPN (Knowledge Performance Now, point of care tool) and chart review,patient hospitalized 08/05/17 -08/10/17 forLeft hip fracture,due tomechanical fall, status postIntramedullary nailing ofLeftfemur with cephalomedullary deviceon 08/06/17. Patient also has a history of hypertension, anemia, arthritis,Osteoporosis, andStevens-Johnson syndrome.Patient discharged to skilled nursing facility [facility] on 08/10/17.    Assessment:Received [facility] Transition of care referral on 08/07/17.Transition of care follow up not completed due to unable to contact patient and will proceed with case closure.     Plan:Case closure due to unable to reach.     Norinne Jeane H. Gardiner Barefootooper RN, BSN, CCM [facility] Care Management [facility] Telephonic CM Phone: [phone] Fax: [phone]

## 2017-09-16 ENCOUNTER — Ambulatory Visit: Payer: 59

## 2017-09-16 DIAGNOSIS — M25552 Pain in left hip: Secondary | ICD-10-CM | POA: Diagnosis not present

## 2017-09-22 ENCOUNTER — Ambulatory Visit: Payer: 59

## 2017-09-30 DIAGNOSIS — M25552 Pain in left hip: Secondary | ICD-10-CM | POA: Diagnosis not present

## 2017-10-28 DIAGNOSIS — M25552 Pain in left hip: Secondary | ICD-10-CM | POA: Diagnosis not present

## 2017-12-11 DIAGNOSIS — I10 Essential (primary) hypertension: Secondary | ICD-10-CM | POA: Diagnosis not present

## 2017-12-11 DIAGNOSIS — E785 Hyperlipidemia, unspecified: Secondary | ICD-10-CM | POA: Diagnosis not present

## 2018-01-07 DIAGNOSIS — J019 Acute sinusitis, unspecified: Secondary | ICD-10-CM | POA: Diagnosis not present

## 2018-01-18 DIAGNOSIS — J019 Acute sinusitis, unspecified: Secondary | ICD-10-CM | POA: Diagnosis not present

## 2018-02-03 DIAGNOSIS — F330 Major depressive disorder, recurrent, mild: Secondary | ICD-10-CM | POA: Diagnosis not present

## 2018-03-08 DIAGNOSIS — F330 Major depressive disorder, recurrent, mild: Secondary | ICD-10-CM | POA: Diagnosis not present

## 2018-03-08 DIAGNOSIS — I10 Essential (primary) hypertension: Secondary | ICD-10-CM | POA: Diagnosis not present

## 2018-03-27 ENCOUNTER — Encounter: Payer: Self-pay | Admitting: Emergency Medicine

## 2018-03-27 ENCOUNTER — Emergency Department
Admission: EM | Admit: 2018-03-27 | Discharge: 2018-03-27 | Disposition: A | Discharge status: Home / Self Care | Payer: 59 | Attending: Emergency Medicine | Admitting: Emergency Medicine

## 2018-03-27 DIAGNOSIS — R112 Nausea with vomiting, unspecified: Secondary | ICD-10-CM | POA: Diagnosis not present

## 2018-03-27 DIAGNOSIS — Z87891 Personal history of nicotine dependence: Secondary | ICD-10-CM | POA: Diagnosis not present

## 2018-03-27 DIAGNOSIS — E860 Dehydration: Secondary | ICD-10-CM | POA: Insufficient documentation

## 2018-03-27 DIAGNOSIS — I10 Essential (primary) hypertension: Secondary | ICD-10-CM | POA: Diagnosis not present

## 2018-03-27 DIAGNOSIS — R1110 Vomiting, unspecified: Secondary | ICD-10-CM | POA: Diagnosis not present

## 2018-03-27 LAB — URINALYSIS, COMPLETE (UACMP) WITH MICROSCOPIC
BILIRUBIN URINE: NEGATIVE
Bacteria, UA: NONE SEEN
GLUCOSE, UA: NEGATIVE mg/dL
Ketones, ur: NEGATIVE mg/dL
LEUKOCYTES UA: NEGATIVE
NITRITE: NEGATIVE
Protein, ur: NEGATIVE mg/dL
SPECIFIC GRAVITY, URINE: 1.014 (ref 1.005–1.030)
pH: 5 (ref 5.0–8.0)

## 2018-03-27 LAB — CBC WITH DIFFERENTIAL/PLATELET
Abs Immature Granulocytes: 0.2 10*3/uL — ABNORMAL HIGH (ref 0.00–0.07)
BASOS PCT: 2 %
Basophils Absolute: 0.1 10*3/uL (ref 0.0–0.1)
Eosinophils Absolute: 0 10*3/uL (ref 0.0–0.5)
Eosinophils Relative: 1 %
HCT: 37.2 % (ref 36.0–46.0)
Hemoglobin: 12.2 g/dL (ref 12.0–15.0)
IMMATURE GRANULOCYTES: 4 %
LYMPHS ABS: 0.8 10*3/uL (ref 0.7–4.0)
Lymphocytes Relative: 14 %
MCH: 30.9 pg (ref 26.0–34.0)
MCHC: 32.8 g/dL (ref 30.0–36.0)
MCV: 94.2 fL (ref 80.0–100.0)
MONOS PCT: 7 %
Monocytes Absolute: 0.4 10*3/uL (ref 0.1–1.0)
NEUTROS ABS: 4.2 10*3/uL (ref 1.7–7.7)
NEUTROS PCT: 72 %
PLATELETS: 152 10*3/uL (ref 150–400)
RBC: 3.95 MIL/uL (ref 3.87–5.11)
RDW: 13.4 % (ref 11.5–15.5)
WBC: 5.8 10*3/uL (ref 4.0–10.5)
nRBC: 0 % (ref 0.0–0.2)

## 2018-03-27 LAB — COMPREHENSIVE METABOLIC PANEL
ALBUMIN: 5.2 g/dL — AB (ref 3.5–5.0)
ALT: 21 U/L (ref 0–44)
AST: 30 U/L (ref 15–41)
Alkaline Phosphatase: 97 U/L (ref 38–126)
Anion gap: 10 (ref 5–15)
BUN: 40 mg/dL — AB (ref 8–23)
CHLORIDE: 101 mmol/L (ref 98–111)
CO2: 28 mmol/L (ref 22–32)
Calcium: 8.8 mg/dL — ABNORMAL LOW (ref 8.9–10.3)
Creatinine, Ser: 1.5 mg/dL — ABNORMAL HIGH (ref 0.44–1.00)
GFR calc Af Amer: 40 mL/min — ABNORMAL LOW (ref >60)
GFR, EST NON AFRICAN AMERICAN: 34 mL/min — AB (ref >60)
GLUCOSE: 119 mg/dL — AB (ref 70–99)
POTASSIUM: 3.7 mmol/L (ref 3.5–5.1)
Sodium: 139 mmol/L (ref 135–145)
Total Bilirubin: 0.8 mg/dL (ref 0.3–1.2)
Total Protein: 8.3 g/dL — ABNORMAL HIGH (ref 6.5–8.1)

## 2018-03-27 LAB — LIPASE, BLOOD: LIPASE: 68 U/L — AB (ref 11–51)

## 2018-03-27 MED ORDER — SODIUM CHLORIDE 0.9 % IV SOLN
Freq: Once | INTRAVENOUS | Status: AC
  Administered 2018-03-27: 11:00:00 via INTRAVENOUS

## 2018-03-27 MED ORDER — SODIUM CHLORIDE 0.9 % IV SOLN
Freq: Once | INTRAVENOUS | Status: AC
  Administered 2018-03-27: 12:00:00 via INTRAVENOUS

## 2018-03-27 MED ORDER — ONDANSETRON 4 MG PO TBDP: 4.0000 mg | ORAL_TABLET | Freq: Three times a day (TID) | ORAL | 0 refills | Status: DC | PRN

## 2018-03-27 MED ORDER — ONDANSETRON HCL 4 MG/2ML IJ SOLN
4.0000 mg | Freq: Once | INTRAMUSCULAR | Status: AC
  Administered 2018-03-27: 4 mg via INTRAVENOUS
  Filled 2018-03-27: qty 2

## 2018-03-27 NOTE — ED Provider Notes (Signed)
[facility] Emergency Department Provider Note       Time seen: ----------------------------------------- 1:52 PM on 03/27/2018 -----------------------------------------   I have reviewed the triage vital signs and the nursing notes.  HISTORY   Chief Complaint Emesis    HPI Alison Price is a 73 y.o. female with a history of anemia, arthritis, chronic pain, GERD, hypertension, Stevens-Johnson syndrome who presents to the ED for sudden onset of vomiting this morning.  She has had for 5 episodes of vomiting but denies any diarrhea or specific abdominal pain.  She is afebrile on arrival.  She reports she has had some stomach upset in the past.  Past Medical History:  Diagnosis Date  . Anemia   . Arthritis   . Chronic pain   . GERD (gastroesophageal reflux disease)   . Hypertension   . Osteoporosis   . Stevens-Johnson syndrome [facility])     Patient Active Problem List   Diagnosis Date Noted  . Hip fx (HCC) 08/05/2017  . Intractable nausea and vomiting 10/23/2016  . Hypokalemia 10/23/2016  . Dehydration 10/23/2016  . HTN (hypertension) 10/23/2016  . History of total abdominal hysterectomy 06/26/2015  . Pelvic pressure in female 06/26/2015  . Vaginal atrophy 06/26/2015  . Hematuria 06/26/2015  . Dysuria 06/26/2015  . Pancytopenia (HCC) 04/13/2015    Past Surgical History:  Procedure Laterality Date  . ABDOMINAL HYSTERECTOMY     partial  . BACK SURGERY    . FOOT SURGERY    . HAND SURGERY     THUMB REPAIR  . HEMORRHOID SURGERY    . INTRAMEDULLARY (IM) NAIL INTERTROCHANTERIC Left 08/06/2017   Procedure: INTRAMEDULLARY (IM) NAIL INTERTROCHANTRIC;  Surgeon: Signa Kell, MD;  Location: [facility] ORS;  Service: Orthopedics;  Laterality: Left;  . POSTERIOR REPAIR      Allergies Codeine and Sulfa antibiotics  Social History Social History   Tobacco Use  . Smoking status: Former Smoker    Last attempt to quit: 02/27/1986    Years since  quitting: 32.0  . Smokeless tobacco: Never Used  Substance Use Topics  . Alcohol use: No  . Drug use: No   Review of Systems Constitutional: Negative for fever. Cardiovascular: Negative for chest pain. Respiratory: Negative for shortness of breath. Gastrointestinal: Positive for vomiting, negative for abdominal pain Musculoskeletal: Negative for back pain. Skin: Negative for rash. Neurological: Negative for headaches, focal weakness or numbness.  All systems negative/normal/unremarkable except as stated in the HPI  ____________________________________________   PHYSICAL EXAM:  VITAL SIGNS: ED Triage Vitals  Enc Vitals Group     BP 03/27/18 0953 (!) 149/67     Pulse Rate 03/27/18 0953 81     Resp 03/27/18 0953 18     Temp 03/27/18 0953 97.9 F (36.6 C)     Temp Source 03/27/18 0953 Oral     SpO2 03/27/18 0953 96 %     Weight 03/27/18 0956 135 lb (61.2 kg)     Height 03/27/18 0956 5\' 7"  (1.702 m)     Head Circumference --      Peak Flow --      Pain Score --      Pain Loc --      Pain Edu? --      Excl. in GC? --    Constitutional: Alert and oriented. Well appearing and in no distress. Eyes: Conjunctivae are normal. Normal extraocular movements. ENT      Head: Normocephalic and atraumatic.      Nose: No  congestion/rhinnorhea.      Mouth/Throat: Mucous membranes are moist.      Neck: No stridor. Cardiovascular: Normal rate, regular rhythm. No murmurs, rubs, or gallops. Respiratory: Normal respiratory effort without tachypnea nor retractions. Breath sounds are clear and equal bilaterally. No wheezes/rales/rhonchi. Gastrointestinal: Soft and nontender. Normal bowel sounds Musculoskeletal: Nontender with normal range of motion in extremities. No lower extremity tenderness nor edema. Neurologic:  Normal speech and language. No gross focal neurologic deficits are appreciated.  Skin:  Skin is warm, dry and intact. No rash noted. Psychiatric: Mood and affect are normal.  Speech and behavior are normal.  ____________________________________________  ED COURSE:  As part of my medical decision making, I reviewed the following data within the electronic MEDICAL RECORD NUMBER History obtained from family if available, nursing notes, old chart and ekg, as well as notes from prior ED visits. Patient presented for sudden onset of vomiting, we will assess with labs as indicated at this time.  Patient will receive IV fluids.   Procedures ____________________________________________   LABS (pertinent positives/negatives)  Labs Reviewed  CBC WITH DIFFERENTIAL/PLATELET - Abnormal; Notable for the following components:      Result Value   Abs Immature Granulocytes 0.20 (*)    All other components within normal limits  COMPREHENSIVE METABOLIC PANEL - Abnormal; Notable for the following components:   Glucose, Bld 119 (*)    BUN 40 (*)    Creatinine, Ser 1.50 (*)    Calcium 8.8 (*)    Total Protein 8.3 (*)    Albumin 5.2 (*)    GFR calc non Af Amer 34 (*)    GFR calc Af Amer 40 (*)    All other components within normal limits  LIPASE, BLOOD - Abnormal; Notable for the following components:   Lipase 68 (*)    All other components within normal limits  URINALYSIS, COMPLETE (UACMP) WITH MICROSCOPIC - Abnormal; Notable for the following components:   Color, Urine YELLOW (*)    APPearance HAZY (*)    Hgb urine dipstick LARGE (*)    RBC / HPF >50 (*)    All other components within normal limits   ____________________________________________   DIFFERENTIAL DIAGNOSIS   Gastroenteritis, dehydration, electrolyte abnormality, influenza  FINAL ASSESSMENT AND PLAN  Vomiting, dehydration   Plan: The patient had presented for vomiting. Patient's labs did reveal significant dehydration for which she received 2 L of IV fluid.  Currently she appears to be asymptomatic and is tolerating food well.  She is cleared for outpatient follow-up.   Ulice Dash, MD     Note: This note was generated in part or whole with voice recognition software. Voice recognition is usually quite accurate but there are transcription errors that can and very often do occur. I apologize for any typographical errors that were not detected and corrected.     Emily Filbert, MD 03/27/18 1355

## 2018-03-27 NOTE — ED Triage Notes (Signed)
Pt to ED with c/o of sudden onset emesis this morning. Pt denies diarrhea. Pt is afebrile at this time.

## 2018-04-09 DIAGNOSIS — M545 Low back pain: Secondary | ICD-10-CM | POA: Diagnosis not present

## 2018-04-13 ENCOUNTER — Other Ambulatory Visit: Payer: Self-pay | Admitting: Internal Medicine

## 2018-04-13 DIAGNOSIS — Z1231 Encounter for screening mammogram for malignant neoplasm of breast: Secondary | ICD-10-CM

## 2018-06-04 DIAGNOSIS — F330 Major depressive disorder, recurrent, mild: Secondary | ICD-10-CM | POA: Diagnosis not present

## 2018-06-04 DIAGNOSIS — I10 Essential (primary) hypertension: Secondary | ICD-10-CM | POA: Diagnosis not present

## 2018-06-04 DIAGNOSIS — E785 Hyperlipidemia, unspecified: Secondary | ICD-10-CM | POA: Diagnosis not present

## 2018-06-04 DIAGNOSIS — M5134 Other intervertebral disc degeneration, thoracic region: Secondary | ICD-10-CM | POA: Diagnosis not present

## 2018-06-10 ENCOUNTER — Other Ambulatory Visit: Payer: Self-pay

## 2018-06-10 ENCOUNTER — Observation Stay
Admission: EM | Admit: 2018-06-10 | Discharge: 2018-06-12 | Disposition: A | Discharge status: Home / Self Care | Payer: 59 | Attending: Internal Medicine | Admitting: Internal Medicine

## 2018-06-10 ENCOUNTER — Emergency Department: Payer: 59

## 2018-06-10 ENCOUNTER — Encounter: Payer: Self-pay | Admitting: Emergency Medicine

## 2018-06-10 ENCOUNTER — Other Ambulatory Visit (HOSPITAL_COMMUNITY): Payer: Self-pay

## 2018-06-10 DIAGNOSIS — R1013 Epigastric pain: Secondary | ICD-10-CM | POA: Diagnosis not present

## 2018-06-10 DIAGNOSIS — E876 Hypokalemia: Secondary | ICD-10-CM | POA: Insufficient documentation

## 2018-06-10 DIAGNOSIS — Z882 Allergy status to sulfonamides status: Secondary | ICD-10-CM | POA: Insufficient documentation

## 2018-06-10 DIAGNOSIS — F329 Major depressive disorder, single episode, unspecified: Secondary | ICD-10-CM | POA: Insufficient documentation

## 2018-06-10 DIAGNOSIS — Z20828 Contact with and (suspected) exposure to other viral communicable diseases: Secondary | ICD-10-CM | POA: Diagnosis not present

## 2018-06-10 DIAGNOSIS — R0602 Shortness of breath: Secondary | ICD-10-CM | POA: Insufficient documentation

## 2018-06-10 DIAGNOSIS — R05 Cough: Secondary | ICD-10-CM | POA: Diagnosis not present

## 2018-06-10 DIAGNOSIS — Z7951 Long term (current) use of inhaled steroids: Secondary | ICD-10-CM | POA: Diagnosis not present

## 2018-06-10 DIAGNOSIS — R1110 Vomiting, unspecified: Secondary | ICD-10-CM | POA: Diagnosis not present

## 2018-06-10 DIAGNOSIS — Z79899 Other long term (current) drug therapy: Secondary | ICD-10-CM | POA: Insufficient documentation

## 2018-06-10 DIAGNOSIS — R197 Diarrhea, unspecified: Secondary | ICD-10-CM | POA: Diagnosis not present

## 2018-06-10 DIAGNOSIS — G8929 Other chronic pain: Secondary | ICD-10-CM | POA: Diagnosis not present

## 2018-06-10 DIAGNOSIS — Z885 Allergy status to narcotic agent status: Secondary | ICD-10-CM | POA: Diagnosis not present

## 2018-06-10 DIAGNOSIS — I160 Hypertensive urgency: Secondary | ICD-10-CM | POA: Insufficient documentation

## 2018-06-10 DIAGNOSIS — Z7982 Long term (current) use of aspirin: Secondary | ICD-10-CM | POA: Diagnosis not present

## 2018-06-10 DIAGNOSIS — J3489 Other specified disorders of nose and nasal sinuses: Secondary | ICD-10-CM | POA: Diagnosis not present

## 2018-06-10 DIAGNOSIS — R1111 Vomiting without nausea: Secondary | ICD-10-CM | POA: Diagnosis not present

## 2018-06-10 DIAGNOSIS — M545 Low back pain: Secondary | ICD-10-CM | POA: Insufficient documentation

## 2018-06-10 DIAGNOSIS — K219 Gastro-esophageal reflux disease without esophagitis: Secondary | ICD-10-CM | POA: Diagnosis not present

## 2018-06-10 DIAGNOSIS — Z87891 Personal history of nicotine dependence: Secondary | ICD-10-CM | POA: Diagnosis not present

## 2018-06-10 DIAGNOSIS — D649 Anemia, unspecified: Secondary | ICD-10-CM | POA: Insufficient documentation

## 2018-06-10 DIAGNOSIS — E869 Volume depletion, unspecified: Secondary | ICD-10-CM | POA: Insufficient documentation

## 2018-06-10 DIAGNOSIS — R110 Nausea: Secondary | ICD-10-CM | POA: Diagnosis not present

## 2018-06-10 DIAGNOSIS — I10 Essential (primary) hypertension: Secondary | ICD-10-CM | POA: Diagnosis not present

## 2018-06-10 DIAGNOSIS — R51 Headache: Secondary | ICD-10-CM | POA: Diagnosis not present

## 2018-06-10 DIAGNOSIS — R0981 Nasal congestion: Secondary | ICD-10-CM | POA: Diagnosis not present

## 2018-06-10 DIAGNOSIS — R112 Nausea with vomiting, unspecified: Principal | ICD-10-CM | POA: Insufficient documentation

## 2018-06-10 DIAGNOSIS — K5900 Constipation, unspecified: Secondary | ICD-10-CM | POA: Insufficient documentation

## 2018-06-10 DIAGNOSIS — R109 Unspecified abdominal pain: Secondary | ICD-10-CM | POA: Diagnosis not present

## 2018-06-10 DIAGNOSIS — R070 Pain in throat: Secondary | ICD-10-CM | POA: Diagnosis not present

## 2018-06-10 DIAGNOSIS — L511 Stevens-Johnson syndrome: Secondary | ICD-10-CM | POA: Diagnosis not present

## 2018-06-10 LAB — CBC WITH DIFFERENTIAL/PLATELET
Abs Immature Granulocytes: 0.12 10*3/uL — ABNORMAL HIGH (ref 0.00–0.07)
Basophils Absolute: 0 10*3/uL (ref 0.0–0.1)
Basophils Relative: 1 %
Eosinophils Absolute: 0 10*3/uL (ref 0.0–0.5)
Eosinophils Relative: 0 %
HCT: 35.6 % — ABNORMAL LOW (ref 36.0–46.0)
Hemoglobin: 12.4 g/dL (ref 12.0–15.0)
Immature Granulocytes: 3 %
Lymphocytes Relative: 18 %
Lymphs Abs: 0.9 10*3/uL (ref 0.7–4.0)
MCH: 31 pg (ref 26.0–34.0)
MCHC: 34.8 g/dL (ref 30.0–36.0)
MCV: 89 fL (ref 80.0–100.0)
Monocytes Absolute: 0.2 10*3/uL (ref 0.1–1.0)
Monocytes Relative: 4 %
Neutro Abs: 3.5 10*3/uL (ref 1.7–7.7)
Neutrophils Relative %: 74 %
Platelets: 188 10*3/uL (ref 150–400)
RBC: 4 MIL/uL (ref 3.87–5.11)
RDW: 13.6 % (ref 11.5–15.5)
WBC: 4.8 10*3/uL (ref 4.0–10.5)
nRBC: 0 % (ref 0.0–0.2)

## 2018-06-10 LAB — COMPREHENSIVE METABOLIC PANEL
ALT: 12 U/L (ref 0–44)
AST: 27 U/L (ref 15–41)
Albumin: 4.3 g/dL (ref 3.5–5.0)
Alkaline Phosphatase: 92 U/L (ref 38–126)
Anion gap: 11 (ref 5–15)
BUN: 11 mg/dL (ref 8–23)
CO2: 27 mmol/L (ref 22–32)
Calcium: 9.1 mg/dL (ref 8.9–10.3)
Chloride: 100 mmol/L (ref 98–111)
Creatinine, Ser: 0.63 mg/dL (ref 0.44–1.00)
GFR calc Af Amer: >60 mL/min (ref >60)
GFR calc non Af Amer: >60 mL/min (ref >60)
Glucose, Bld: 166 mg/dL — ABNORMAL HIGH (ref 70–99)
Potassium: 3.2 mmol/L — ABNORMAL LOW (ref 3.5–5.1)
Sodium: 138 mmol/L (ref 135–145)
Total Bilirubin: 0.9 mg/dL (ref 0.3–1.2)
Total Protein: 7.8 g/dL (ref 6.5–8.1)

## 2018-06-10 LAB — MAGNESIUM: Magnesium: 1.5 mg/dL — ABNORMAL LOW (ref 1.7–2.4)

## 2018-06-10 LAB — URINALYSIS, COMPLETE (UACMP) WITH MICROSCOPIC
Bilirubin Urine: NEGATIVE
Glucose, UA: NEGATIVE mg/dL
Hgb urine dipstick: NEGATIVE
Ketones, ur: NEGATIVE mg/dL
Leukocytes,Ua: NEGATIVE
Nitrite: NEGATIVE
Protein, ur: NEGATIVE mg/dL
Specific Gravity, Urine: 1.012 (ref 1.005–1.030)
WBC, UA: NONE SEEN WBC/hpf (ref 0–5)
pH: 8 (ref 5.0–8.0)

## 2018-06-10 LAB — LIPASE, BLOOD: Lipase: 88 U/L — ABNORMAL HIGH (ref 11–51)

## 2018-06-10 LAB — TROPONIN I: Troponin I: <0.03 ng/mL (ref <0.03)

## 2018-06-10 LAB — SARS CORONAVIRUS 2 BY RT PCR (HOSPITAL ORDER, PERFORMED IN ~~LOC~~ HOSPITAL LAB): SARS Coronavirus 2: NEGATIVE

## 2018-06-10 MED ORDER — VENLAFAXINE HCL 37.5 MG PO TABS
75.0000 mg | ORAL_TABLET | Freq: Every day | ORAL | Status: DC
  Administered 2018-06-11 – 2018-06-12 (×2): 75 mg via ORAL
  Filled 2018-06-10 (×3): qty 2

## 2018-06-10 MED ORDER — PROMETHAZINE HCL 25 MG/ML IJ SOLN
6.2500 mg | Freq: Once | INTRAMUSCULAR | Status: AC
  Administered 2018-06-10: 03:00:00 6.25 mg via INTRAVENOUS

## 2018-06-10 MED ORDER — LORATADINE 10 MG PO TABS
10.0000 mg | ORAL_TABLET | Freq: Every day | ORAL | Status: DC
  Administered 2018-06-11 – 2018-06-12 (×2): 10 mg via ORAL
  Filled 2018-06-10 (×2): qty 1

## 2018-06-10 MED ORDER — TIZANIDINE HCL 4 MG PO TABS
4.0000 mg | ORAL_TABLET | Freq: Four times a day (QID) | ORAL | Status: DC | PRN
  Filled 2018-06-10: qty 1

## 2018-06-10 MED ORDER — FENOFIBRIC ACID 135 MG PO CPDR: 135.0000 mg | DELAYED_RELEASE_CAPSULE | Freq: Every day | ORAL | Status: DC

## 2018-06-10 MED ORDER — ONDANSETRON HCL 4 MG/2ML IJ SOLN
4.0000 mg | Freq: Once | INTRAMUSCULAR | Status: AC
  Administered 2018-06-10: 21:00:00 4 mg via INTRAVENOUS

## 2018-06-10 MED ORDER — MECLIZINE HCL 25 MG PO TABS
25.0000 mg | ORAL_TABLET | Freq: Two times a day (BID) | ORAL | Status: DC | PRN
  Administered 2018-06-10 – 2018-06-11 (×2): 25 mg via ORAL
  Filled 2018-06-10 (×4): qty 1

## 2018-06-10 MED ORDER — PROMETHAZINE HCL 25 MG PO TABS
12.5000 mg | ORAL_TABLET | Freq: Four times a day (QID) | ORAL | Status: DC | PRN
  Administered 2018-06-11 – 2018-06-12 (×4): 12.5 mg via ORAL
  Filled 2018-06-10 (×5): qty 1

## 2018-06-10 MED ORDER — VITAMIN D 25 MCG (1000 UNIT) PO TABS
6000.0000 [IU] | ORAL_TABLET | Freq: Every day | ORAL | Status: DC
  Administered 2018-06-11 – 2018-06-12 (×2): 6000 [IU] via ORAL
  Filled 2018-06-10 (×2): qty 6

## 2018-06-10 MED ORDER — ESTROGENS, CONJUGATED 0.625 MG/GM VA CREA
1.0000 | TOPICAL_CREAM | VAGINAL | Status: DC
  Filled 2018-06-10: qty 30

## 2018-06-10 MED ORDER — IPRATROPIUM-ALBUTEROL 20-100 MCG/ACT IN AERS: 2.0000 | INHALATION_SPRAY | Freq: Four times a day (QID) | RESPIRATORY_TRACT | Status: DC | PRN

## 2018-06-10 MED ORDER — PROMETHAZINE HCL 25 MG/ML IJ SOLN
12.5000 mg | Freq: Three times a day (TID) | INTRAMUSCULAR | Status: DC | PRN
  Administered 2018-06-10: 15:00:00 12.5 mg via INTRAVENOUS
  Filled 2018-06-10 (×2): qty 1

## 2018-06-10 MED ORDER — ACETAMINOPHEN 325 MG PO TABS: 650.0000 mg | ORAL_TABLET | Freq: Four times a day (QID) | ORAL | Status: DC | PRN

## 2018-06-10 MED ORDER — MAGNESIUM HYDROXIDE 400 MG/5ML PO SUSP
30.0000 mL | Freq: Every day | ORAL | Status: DC | PRN
  Filled 2018-06-10: qty 30

## 2018-06-10 MED ORDER — ONDANSETRON HCL 4 MG/2ML IJ SOLN
4.0000 mg | Freq: Once | INTRAMUSCULAR | Status: AC
  Administered 2018-06-10: 4 mg via INTRAVENOUS
  Filled 2018-06-10: qty 2

## 2018-06-10 MED ORDER — ADULT MULTIVITAMIN W/MINERALS CH
1.0000 | ORAL_TABLET | Freq: Every day | ORAL | Status: DC
  Administered 2018-06-11 – 2018-06-12 (×2): 1 via ORAL
  Filled 2018-06-10 (×2): qty 1

## 2018-06-10 MED ORDER — SODIUM CHLORIDE 0.9 % IV SOLN
INTRAVENOUS | Status: DC
  Administered 2018-06-10 – 2018-06-12 (×4): via INTRAVENOUS

## 2018-06-10 MED ORDER — LISINOPRIL 20 MG PO TABS
20.0000 mg | ORAL_TABLET | Freq: Every day | ORAL | Status: DC
  Administered 2018-06-12: 08:00:00 20 mg via ORAL
  Filled 2018-06-10 (×3): qty 1

## 2018-06-10 MED ORDER — HYDRALAZINE HCL 20 MG/ML IJ SOLN
10.0000 mg | Freq: Four times a day (QID) | INTRAMUSCULAR | Status: DC | PRN
  Administered 2018-06-10 (×2): 10 mg via INTRAVENOUS
  Filled 2018-06-10 (×2): qty 1

## 2018-06-10 MED ORDER — MORPHINE SULFATE (PF) 2 MG/ML IV SOLN
2.0000 mg | INTRAVENOUS | Status: DC | PRN
  Administered 2018-06-10 – 2018-06-11 (×3): 2 mg via INTRAVENOUS
  Filled 2018-06-10 (×3): qty 1

## 2018-06-10 MED ORDER — ONDANSETRON 4 MG PO TBDP
4.0000 mg | ORAL_TABLET | Freq: Three times a day (TID) | ORAL | Status: DC | PRN
  Filled 2018-06-10: qty 1

## 2018-06-10 MED ORDER — POTASSIUM CHLORIDE 20 MEQ PO PACK: 40.0000 meq | PACK | Freq: Once | ORAL | Status: DC

## 2018-06-10 MED ORDER — FERROUS SULFATE 325 (65 FE) MG PO TABS
325.0000 mg | ORAL_TABLET | Freq: Three times a day (TID) | ORAL | Status: DC
  Administered 2018-06-11 – 2018-06-12 (×5): 325 mg via ORAL
  Filled 2018-06-10 (×6): qty 1

## 2018-06-10 MED ORDER — PANTOPRAZOLE SODIUM 40 MG PO TBEC: 40.0000 mg | DELAYED_RELEASE_TABLET | Freq: Every day | ORAL | Status: DC

## 2018-06-10 MED ORDER — ACETAMINOPHEN 650 MG RE SUPP: 650.0000 mg | Freq: Four times a day (QID) | RECTAL | Status: DC | PRN

## 2018-06-10 MED ORDER — METOCLOPRAMIDE HCL 5 MG/5ML PO SOLN
10.0000 mg | Freq: Three times a day (TID) | ORAL | Status: DC
  Administered 2018-06-10: 10 mg via ORAL
  Filled 2018-06-10 (×4): qty 10

## 2018-06-10 MED ORDER — MAGNESIUM SULFATE 2 GM/50ML IV SOLN
2.0000 g | Freq: Once | INTRAVENOUS | Status: AC
  Administered 2018-06-10: 2 g via INTRAVENOUS
  Filled 2018-06-10: qty 50

## 2018-06-10 MED ORDER — ALUM & MAG HYDROXIDE-SIMETH 200-200-20 MG/5ML PO SUSP: 30.0000 mL | ORAL | Status: DC | PRN

## 2018-06-10 MED ORDER — PROMETHAZINE HCL 25 MG/ML IJ SOLN
6.2500 mg | Freq: Once | INTRAMUSCULAR | Status: AC
  Administered 2018-06-10: 6.25 mg via INTRAVENOUS
  Filled 2018-06-10: qty 1

## 2018-06-10 MED ORDER — AMLODIPINE BESYLATE 5 MG PO TABS
5.0000 mg | ORAL_TABLET | Freq: Every day | ORAL | Status: DC
  Filled 2018-06-10: qty 1

## 2018-06-10 MED ORDER — PANTOPRAZOLE SODIUM 40 MG IV SOLR
40.0000 mg | Freq: Two times a day (BID) | INTRAVENOUS | Status: DC
  Administered 2018-06-10 – 2018-06-11 (×3): 40 mg via INTRAVENOUS
  Filled 2018-06-10 (×3): qty 40

## 2018-06-10 MED ORDER — ONDANSETRON HCL 4 MG PO TABS: 4.0000 mg | ORAL_TABLET | Freq: Three times a day (TID) | ORAL | Status: DC | PRN

## 2018-06-10 MED ORDER — ENOXAPARIN SODIUM 40 MG/0.4ML ~~LOC~~ SOLN
40.0000 mg | SUBCUTANEOUS | Status: DC
  Administered 2018-06-11 – 2018-06-12 (×2): 40 mg via SUBCUTANEOUS
  Filled 2018-06-10 (×2): qty 0.4

## 2018-06-10 MED ORDER — IPRATROPIUM-ALBUTEROL 0.5-2.5 (3) MG/3ML IN SOLN: 3.0000 mL | Freq: Four times a day (QID) | RESPIRATORY_TRACT | Status: DC | PRN

## 2018-06-10 MED ORDER — MORPHINE SULFATE ER 15 MG PO TBCR
60.0000 mg | EXTENDED_RELEASE_TABLET | Freq: Three times a day (TID) | ORAL | Status: DC
  Administered 2018-06-10 – 2018-06-12 (×5): 60 mg via ORAL
  Filled 2018-06-10 (×7): qty 4

## 2018-06-10 MED ORDER — VITAMIN C 500 MG PO TABS: 500.0000 mg | ORAL_TABLET | Freq: Three times a day (TID) | ORAL | Status: DC

## 2018-06-10 MED ORDER — OXYCODONE-ACETAMINOPHEN 5-325 MG PO TABS
1.0000 | ORAL_TABLET | ORAL | Status: DC | PRN
  Administered 2018-06-10 – 2018-06-11 (×4): 1 via ORAL
  Filled 2018-06-10 (×4): qty 1

## 2018-06-10 MED ORDER — DIMENHYDRINATE 50 MG PO TABS: 25.0000 mg | ORAL_TABLET | Freq: Three times a day (TID) | ORAL | Status: DC | PRN

## 2018-06-10 MED ORDER — IOHEXOL 300 MG/ML  SOLN
100.0000 mL | Freq: Once | INTRAMUSCULAR | Status: AC | PRN
  Administered 2018-06-10: 100 mL via INTRAVENOUS

## 2018-06-10 MED ORDER — ONDANSETRON HCL 4 MG/2ML IJ SOLN
4.0000 mg | Freq: Once | INTRAMUSCULAR | Status: AC
  Administered 2018-06-10: 03:00:00 4 mg via INTRAVENOUS

## 2018-06-10 MED ORDER — FENOFIBRATE 145 MG PO TABS
145.0000 mg | ORAL_TABLET | Freq: Every day | ORAL | Status: DC
  Filled 2018-06-10: qty 1

## 2018-06-10 MED ORDER — BUTALBITAL-APAP-CAFFEINE 50-325-40 MG PO TABS
1.0000 | ORAL_TABLET | Freq: Four times a day (QID) | ORAL | Status: DC | PRN
  Administered 2018-06-10 – 2018-06-12 (×4): 1 via ORAL
  Filled 2018-06-10 (×5): qty 1

## 2018-06-10 MED ORDER — POTASSIUM CHLORIDE 10 MEQ/100ML IV SOLN
10.0000 meq | INTRAVENOUS | Status: AC
  Administered 2018-06-10 (×2): 10 meq via INTRAVENOUS
  Filled 2018-06-10 (×2): qty 100

## 2018-06-10 MED ORDER — ONDANSETRON HCL 4 MG/2ML IJ SOLN
4.0000 mg | Freq: Four times a day (QID) | INTRAMUSCULAR | Status: DC
  Administered 2018-06-10 – 2018-06-11 (×4): 4 mg via INTRAVENOUS
  Filled 2018-06-10 (×6): qty 2

## 2018-06-10 NOTE — ED Notes (Signed)
Pt assisted to the restroom. Standby assist required. Pt states she is nauseated and is not feeling well. Will notify MD.

## 2018-06-10 NOTE — Progress Notes (Signed)
[employer] [facility] [facility] [place] [facility]   PATIENT NAME: Alison Price    MR#:  [id number]  DATE OF BIRTH:  1945-05-18  SUBJECTIVE:  CHIEF COMPLAINT:   Chief Complaint  Patient presents with  . Vomiting  . Shortness of Breath  . Diarrhea   -Patient with intractable abdominal pain, also complains of significant nausea and vomiting -Has chronic back pain  REVIEW OF SYSTEMS:  Review of Systems  Constitutional: Negative for chills, fever and malaise/fatigue.  HENT: Negative for congestion, ear discharge, hearing loss and nosebleeds.   Eyes: Negative for blurred vision and double vision.  Respiratory: Negative for cough, shortness of breath and wheezing.   Cardiovascular: Negative for chest pain and palpitations.  Gastrointestinal: Positive for abdominal pain, nausea and vomiting. Negative for constipation and diarrhea.  Genitourinary: Negative for dysuria.  Neurological: Negative for dizziness, focal weakness, seizures, weakness and headaches.  Psychiatric/Behavioral: Negative for depression.    DRUG ALLERGIES:   Allergies  Allergen Reactions  . Codeine Nausea And Vomiting  . Sulfa Antibiotics Other (See Comments)    STEVEN JOHNSON SYNDROME    VITALS:  Blood pressure (!) 180/91, pulse 83, temperature 98.2 F (36.8 C), temperature source Oral, resp. rate 16, height 5\' 7"  (1.702 m), weight 63.5 kg, SpO2 100 %.  PHYSICAL EXAMINATION:  Physical Exam   GENERAL:  73 y.o.-year-old thin built, ill nourished patient lying in the bed with no acute distress.  EYES: Pupils equal, round, reactive to light and accommodation. No scleral icterus. Extraocular muscles intact.  HEENT: Head atraumatic, normocephalic. Oropharynx and nasopharynx clear.  NECK:  Supple, no jugular venous distention. No thyroid enlargement, no tenderness.  LUNGS: Normal breath sounds bilaterally, no wheezing, rales,rhonchi or crepitation. No use of accessory muscles of respiration.   Decreased bibasilar breath sounds CARDIOVASCULAR: S1, S2 normal. No murmurs, rubs, or gallops.  ABDOMEN: Soft, nontender, nondistended. Bowel sounds present. No organomegaly or mass.  EXTREMITIES: No pedal edema, cyanosis, or clubbing.  NEUROLOGIC: Cranial nerves II through XII are intact. Muscle strength 5/5 in all extremities. Sensation intact. Gait not checked.  PSYCHIATRIC: The patient is alert and oriented x 3.  SKIN: No obvious rash, lesion, or ulcer.    LABORATORY PANEL:   CBC Recent Labs  Lab 06/10/18 0306  WBC 4.8  HGB 12.4  HCT 35.6*  PLT 188   ------------------------------------------------------------------------------------------------------------------  Chemistries  Recent Labs  Lab 06/10/18 0306  NA 138  K 3.2*  CL 100  CO2 27  GLUCOSE 166*  BUN 11  CREATININE 0.63  CALCIUM 9.1  MG 1.5*  AST 27  ALT 12  ALKPHOS 92  BILITOT 0.9   ------------------------------------------------------------------------------------------------------------------  Cardiac Enzymes Recent Labs  Lab 06/10/18 0306  TROPONINI <0.03   ------------------------------------------------------------------------------------------------------------------  RADIOLOGY:  Ct Abdomen Pelvis W Contrast  Result Date: 06/10/2018 CLINICAL DATA:  73 year old female with sore throat and vomiting for 2 days, diarrhea and fever. Negative COVID-19 today. EXAM: CT ABDOMEN AND PELVIS WITH CONTRAST TECHNIQUE: Multidetector CT imaging of the abdomen and pelvis was performed using the standard protocol following bolus administration of intravenous contrast. CONTRAST:  OMNIPAQUE IOHEXOL 300 MG/ML  SOLN COMPARISON:  CT Abdomen and Pelvis 11/10/2011. FINDINGS: Lower chest: Peribronchial and patchy bilateral lower lung pulmonary ground-glass opacity, although chronic to a degree. Still, the degree of abnormal opacity and extent is progressed since the 2013 CT. No superimposed pleural or pericardial  effusion. Hepatobiliary: Negative liver and gallbladder. Pancreas: Negative. Spleen: Negative. Adrenals/Urinary Tract: Normal adrenal glands. Bulky  staghorn calculus in the left renal lower pole measures up to 2 centimeters. Additional punctate left upper pole calculus. There is no left hydronephrosis or perinephric stranding. The proximal left ureter appears decompressed. Smaller right lower pole developing staghorn calculus, 11 millimeters. Additional punctate right nephrolithiasis. No right hydronephrosis or perinephric stranding. Proximal right ureter appears normal. Small chronic bilateral renal cysts are stable and benign. Diminutive and unremarkable urinary bladder. Stomach/Bowel: No dilated large or small bowel loops. Redundant large bowel with areas of retained low-density stool, primarily in the transverse colon. It appears that the descending colon crosses midline to the right at the pelvic inlet. Also the cecum and ileocecal valve are difficult to delineate. The stomach is fairly decompressed. There is no free air or free fluid identified. The duodenum does cross the midline as expected. Vascular/Lymphatic: Aortoiliac calcified atherosclerosis. Major arterial structures are patent. Portal venous system is patent. No lymphadenopathy. Reproductive: Surgically absent. Other: No pelvic free fluid. Musculoskeletal: Previous lower lumbar fusion. Chronic appearing mild L1 and L3 compression fractures or bulky Schmorl's nodes. Similar superior endplate Schmorl's node at T11. Osteopenia. Previous proximal left femur ORIF. No acute osseous abnormality identified. IMPRESSION: 1. No inflammatory process identified in the abdomen or pelvis. 2. The large bowel is redundant and difficult to delineate in areas with intermittent low-density retained stool. But there is no evidence of bowel obstruction, and no focal bowel inflammation identified. 3. Partially visible abnormal peribronchial and ground-glass opacity at both  lung bases, although this was present to a lesser extent in 2013. Consider chronic Aspiration versus a progressed chronic lung disease. Electronically Signed   By: Odessa FlemingH  Hall M.D.   On: 06/10/2018 04:50   Dg Chest Portable 1 View  Result Date: 06/10/2018 CLINICAL DATA:  Shortness of breath EXAM: PORTABLE CHEST 1 VIEW COMPARISON:  08/05/2017 FINDINGS: Heart is normal size. Patchy bilateral lower lobe airspace opacities are similar to prior studies, likely scarring. Gas under the right hemidiaphragm felt to be within colon when compared to prior studies. No effusions. No acute bony abnormality. IMPRESSION: Bibasilar airspace opacities are similar to prior studies dating back to January 2019, felt represent scarring. No active disease. Electronically Signed   By: Charlett NoseKevin  Dover M.D.   On: 06/10/2018 03:31    EKG:   Orders placed or performed during the hospital encounter of 06/10/18  . EKG 12-Lead    ASSESSMENT AND PLAN:   73 year old female with past medical history significant for chronic abdominal pain and chronic low back pain on morphine long-acting at home, retention, osteoporosis presents from home secondary to intractable nausea and vomiting  1.  Acute gastroenteritis-COVID-19 test is negative -CT of the abdomen and pelvis showing no acute inflammatory process, redundant large bowel with retained stool.  No obstruction or ileus or inflammation identified. -Conservative management with fluids, laxatives -Zofran and Phenergan as needed -Currently on clear liquid diet, advance as tolerated  2.  Acute on chronic abdominal pain and back pain-no acute abdominal findings on CT, patient has pain from previous surgeries and scar tissue according to her. -IV pain medications for now.  Continue her chronic MS Contin  3.  Hypokalemia and hypomagnesemia-being replaced.  Follow-up in a.m.  4.  Hypertensive urgency-on lisinopril, Norvasc and IV hydralazine PRN  5.  DVT prophylaxis-Lovenox   All the  records are reviewed and case discussed with Care Management/Social Workerr. Management plans discussed with the patient, family and they are in agreement.  CODE STATUS: Full code  TOTAL TIME TAKING CARE OF  THIS PATIENT: 36 minutes.   POSSIBLE D/C IN 2 DAYS, DEPENDING ON CLINICAL CONDITION.   Enid Baas M.D on 06/10/2018 at 1:35 PM  Between 7am to 6pm - Pager - [phone]  After 6pm go to [email or web address] - Social research officer, government  [facility]  Office  [phone]  CC: Primary care physician; Jaclyn Shaggy, MD

## 2018-06-10 NOTE — ED Notes (Signed)
returned to exam room from xray

## 2018-06-10 NOTE — ED Notes (Signed)
Pt assisted to restroom. Standby assist required. Pt alert and able to verbalize her concerns easily. No increased work of breathing or acute distress noted at this time. Provided for comfort and safety. Iv fluid running without difficulty. Will continue to assess.

## 2018-06-10 NOTE — ED Notes (Signed)
Pt to CT

## 2018-06-10 NOTE — ED Triage Notes (Addendum)
Pt presents to ED from home via EMS with sore throat and vomiting for the past 2 days, diarrhea and fever today. Denies pain. No increased work of breathing noted at this time. +nausa currently. Pt alert and answering questions without difficulty.

## 2018-06-10 NOTE — ED Provider Notes (Signed)
[facility] Emergency Department Provider Note   First MD Initiated Contact with Patient 06/10/18 [phone]     (approximate)  I have reviewed the triage vital signs and the nursing notes.   HISTORY  Chief Complaint Vomiting; Shortness of Breath; and Diarrhea    HPI Alison Price is a 73 y.o. female with medical history as listed below presents to the emergency department with 3-day history of non-bloody nonbilious vomiting and diarrhea.  Patient admits to fever today however patient afebrile on presentation temperature 98.2.  Patient denies any pain.  Patient denies any dyspnea no chest pain.  Patient denies any urinary symptoms.        Past Medical History:  Diagnosis Date   Anemia    Arthritis    Chronic pain    GERD (gastroesophageal reflux disease)    Hypertension    Osteoporosis    Stevens-Johnson syndrome [facility](HCC)     Patient Active Problem List   Diagnosis Date Noted   Hip fx (HCC) 08/05/2017   Intractable nausea and vomiting 10/23/2016   Hypokalemia 10/23/2016   Dehydration 10/23/2016   HTN (hypertension) 10/23/2016   History of total abdominal hysterectomy 06/26/2015   Pelvic pressure in female 06/26/2015   Vaginal atrophy 06/26/2015   Hematuria 06/26/2015   Dysuria 06/26/2015   Pancytopenia (HCC) 04/13/2015    Past Surgical History:  Procedure Laterality Date   ABDOMINAL HYSTERECTOMY     partial   BACK SURGERY     FOOT SURGERY     HAND SURGERY     THUMB REPAIR   HEMORRHOID SURGERY     INTRAMEDULLARY (IM) NAIL INTERTROCHANTERIC Left 08/06/2017   Procedure: INTRAMEDULLARY (IM) NAIL INTERTROCHANTRIC;  Surgeon: Signa KellPatel, Sunny, MD;  Location: [facility] ORS;  Service: Orthopedics;  Laterality: Left;   POSTERIOR REPAIR      Prior to Admission medications   Medication Sig Start Date End Date Taking? Authorizing Provider  alum & mag hydroxide-simeth (MAALOX PLUS) 400-400-40 MG/5ML suspension Take 30 mLs by  mouth every 4 (four) hours as needed.    [provider]  aspirin EC 81 MG tablet Take 81 mg by mouth daily.    [provider]  butalbital-acetaminophen-caffeine (FIORICET, ESGIC) 50-325-40 MG tablet Take 1 tablet by mouth every 6 (six) hours as needed for headache or migraine.    [provider]  Cholecalciferol (VITAMIN D) 2000 units CAPS Take 6,000 Units by mouth daily.     [provider]  Choline Fenofibrate (FENOFIBRIC ACID) 135 MG CPDR Take 135 mg by mouth daily.     [provider]  conjugated estrogens (PREMARIN) vaginal cream Place 1 Applicatorful vaginally See admin instructions. Daily on Sunday and Wednesday    [provider]  dimenhyDRINATE (DRAMAMINE) 50 MG tablet Take 50 mg by mouth every 8 (eight) hours as needed. N/V (usually takes with Morphine and oxycodone)    [provider]  etodolac (LODINE) 400 MG tablet Take 400 mg by mouth 2 (two) times daily.     [provider]  ferrous sulfate 325 (65 FE) MG tablet Take 325 mg by mouth 3 (three) times daily with meals.    [provider]  fexofenadine (ALLEGRA) 180 MG tablet Take 180 mg by mouth daily.    [provider]  Fluticasone-Salmeterol (ADVAIR) 250-50 MCG/DOSE AEPB Inhale 1 puff into the lungs 2 (two) times daily.    [provider]  Ipratropium-Albuterol (COMBIVENT RESPIMAT) 20-100 MCG/ACT AERS respimat Inhale 2 puffs into the  lungs every 6 (six) hours as needed for wheezing or shortness of breath.    [provider]  meclizine (ANTIVERT) 25 MG tablet Take 25 mg by mouth 2 (two) times daily as needed for dizziness.    [provider]  morphine (MS CONTIN) 60 MG 12 hr tablet Take 1 tablet (60 mg total) by mouth 3 (three) times daily. 08/19/17   Sharee Holster, NP  Multiple Vitamin (MULTIVITAMIN) tablet Take 1 tablet by mouth daily.    [provider]  ondansetron (ZOFRAN ODT) 4 MG disintegrating tablet  Take 1 tablet (4 mg total) by mouth every 8 (eight) hours as needed for nausea or vomiting. 03/27/18   Emily Filbert, MD  ondansetron (ZOFRAN) 4 MG tablet Take 1 tablet (4 mg total) by mouth every 8 (eight) hours as needed for nausea or vomiting. 10/25/16   Shaune Pollack, MD  oxyCODONE-acetaminophen (PERCOCET/ROXICET) 5-325 MG tablet Take 1 tablet by mouth every 4 (four) hours as needed for moderate pain or severe pain.    [provider]  pantoprazole (PROTONIX) 40 MG tablet Take 40 mg by mouth daily.     [provider]  tiZANidine (ZANAFLEX) 4 MG tablet Take 4 mg by mouth every 6 (six) hours as needed for muscle spasms.  01/17/15   [provider]  venlafaxine (EFFEXOR) 75 MG tablet Take 75 mg by mouth daily.    [provider]  vitamin C (ASCORBIC ACID) 500 MG tablet Take 500 mg by mouth 3 (three) times daily. anemia- give with ferrous sulfate to potentiate iron absorption    [provider]    Allergies Codeine and Sulfa antibiotics  Family History  Problem Relation Age of Onset   Diabetes Paternal Grandmother    Uterine cancer Maternal Aunt    Stomach cancer Maternal Aunt    Breast cancer Neg Hx    Ovarian cancer Neg Hx    Colon cancer Neg Hx    Heart disease Neg Hx     Social History Social History   Tobacco Use   Smoking status: Former Smoker    Last attempt to quit: 02/27/1986    Years since quitting: 32.3   Smokeless tobacco: Never Used  Substance Use Topics   Alcohol use: No   Drug use: No    Review of Systems Constitutional: No fever/chills Eyes: No visual changes. ENT: No sore throat. Cardiovascular: Denies chest pain. Respiratory: Denies shortness of breath. Gastrointestinal: Negative for abdominal pain positive for vomiting Genitourinary: Negative for dysuria. Musculoskeletal: Negative for neck pain.  Negative for back pain. Integumentary: Negative for rash. Neurological: Negative for headaches, focal  weakness or numbness.   ____________________________________________   PHYSICAL EXAM:  VITAL SIGNS: ED Triage Vitals  Enc Vitals Group     BP 06/10/18 0300 (!) 158/87     Pulse Rate 06/10/18 0300 78     Resp 06/10/18 0300 20     Temp 06/10/18 0300 98.2 F (36.8 C)     Temp Source 06/10/18 0300 Oral     SpO2 06/10/18 0257 99 %     Weight 06/10/18 0301 63.5 kg (140 lb)     Height 06/10/18 0301 1.702 m ( )     Head Circumference --      Peak Flow --      Pain Score 06/10/18 0300 0     Pain Loc --      Pain Edu? --      Excl. in GC? --  Constitutional: Alert and oriented.  Actively retching  eyes: Conjunctivae are normal.  Mouth/Throat: Mucous membranes are moist.  Oropharynx non-erythematous. Neck: No stridor. Cardiovascular: Normal rate, regular rhythm. Good peripheral circulation. Grossly normal heart sounds. Respiratory: Normal respiratory effort.  No retractions. No audible wheezing. Gastrointestinal: Soft and nontender. No distention.  Musculoskeletal: No lower extremity tenderness nor edema. No gross deformities of extremities. Neurologic:  Normal speech and language. No gross focal neurologic deficits are appreciated.  Skin:  Skin is warm, dry and intact. No rash noted. Psychiatric: Mood and affect are normal. Speech and behavior are normal.  ____________________________________________   LABS (all labs ordered are listed, but only abnormal results are displayed)  Labs Reviewed  CBC WITH DIFFERENTIAL/PLATELET - Abnormal; Notable for the following components:      Result Value   HCT 35.6 (*)    Abs Immature Granulocytes 0.12 (*)    All other components within normal limits  COMPREHENSIVE METABOLIC PANEL - Abnormal; Notable for the following components:   Potassium 3.2 (*)    Glucose, Bld 166 (*)    All other components within normal limits  LIPASE, BLOOD - Abnormal; Notable for the following components:   Lipase 88 (*)    All other components within  normal limits  URINALYSIS, COMPLETE (UACMP) WITH MICROSCOPIC - Abnormal; Notable for the following components:   Color, Urine YELLOW (*)    APPearance CLOUDY (*)    Bacteria, UA RARE (*)    All other components within normal limits  SARS CORONAVIRUS 2 (HOSPITAL ORDER, PERFORMED IN [facility])  TROPONIN I    RADIOLOGY I, [place] Jaylise Peek, personally viewed and evaluated these images (plain radiographs) as part of my medical decision making, as well as reviewing the written report by the radiologist.  ED MD interpretation: No inflammatory process identified in the abdomen or the pelvis per radiologist.  Official radiology report(s): Ct Abdomen Pelvis W Contrast  Result Date: 06/10/2018 CLINICAL DATA:  73 year old female with sore throat and vomiting for 2 days, diarrhea and fever. Negative COVID-19 today. EXAM: CT ABDOMEN AND PELVIS WITH CONTRAST TECHNIQUE: Multidetector CT imaging of the abdomen and pelvis was performed using the standard protocol following bolus administration of intravenous contrast. CONTRAST:  OMNIPAQUE IOHEXOL 300 MG/ML  SOLN COMPARISON:  CT Abdomen and Pelvis 11/10/2011. FINDINGS: Lower chest: Peribronchial and patchy bilateral lower lung pulmonary ground-glass opacity, although chronic to a degree. Still, the degree of abnormal opacity and extent is progressed since the 2013 CT. No superimposed pleural or pericardial effusion. Hepatobiliary: Negative liver and gallbladder. Pancreas: Negative. Spleen: Negative. Adrenals/Urinary Tract: Normal adrenal glands. Bulky staghorn calculus in the left renal lower pole measures up to 2 centimeters. Additional punctate left upper pole calculus. There is no left hydronephrosis or perinephric stranding. The proximal left ureter appears decompressed. Smaller right lower pole developing staghorn calculus, 11 millimeters. Additional punctate right nephrolithiasis. No right hydronephrosis or perinephric stranding. Proximal  right ureter appears normal. Small chronic bilateral renal cysts are stable and benign. Diminutive and unremarkable urinary bladder. Stomach/Bowel: No dilated large or small bowel loops. Redundant large bowel with areas of retained low-density stool, primarily in the transverse colon. It appears that the descending colon crosses midline to the right at the pelvic inlet. Also the cecum and ileocecal valve are difficult to delineate. The stomach is fairly decompressed. There is no free air or free fluid identified. The duodenum does cross the midline as expected. Vascular/Lymphatic: Aortoiliac calcified atherosclerosis. Major arterial structures are patent.  Portal venous system is patent. No lymphadenopathy. Reproductive: Surgically absent. Other: No pelvic free fluid. Musculoskeletal: Previous lower lumbar fusion. Chronic appearing mild L1 and L3 compression fractures or bulky Schmorl's nodes. Similar superior endplate Schmorl's node at T11. Osteopenia. Previous proximal left femur ORIF. No acute osseous abnormality identified. IMPRESSION: 1. No inflammatory process identified in the abdomen or pelvis. 2. The large bowel is redundant and difficult to delineate in areas with intermittent low-density retained stool. But there is no evidence of bowel obstruction, and no focal bowel inflammation identified. 3. Partially visible abnormal peribronchial and ground-glass opacity at both lung bases, although this was present to a lesser extent in 2013. Consider chronic Aspiration versus a progressed chronic lung disease. Electronically Signed   By: Odessa Fleming M.D.   On: 06/10/2018 04:50   Dg Chest Portable 1 View  Result Date: 06/10/2018 CLINICAL DATA:  Shortness of breath EXAM: PORTABLE CHEST 1 VIEW COMPARISON:  08/05/2017 FINDINGS: Heart is normal size. Patchy bilateral lower lobe airspace opacities are similar to prior studies, likely scarring. Gas under the right hemidiaphragm felt to be within colon when compared to  prior studies. No effusions. No acute bony abnormality. IMPRESSION: Bibasilar airspace opacities are similar to prior studies dating back to January 2019, felt represent scarring. No active disease. Electronically Signed   By: Charlett Nose M.D.   On: 06/10/2018 03:31    ___________ Procedures   ____________________________________________   INITIAL IMPRESSION / MDM / ASSESSMENT AND PLAN / ED COURSE  As part of my medical decision making, I reviewed the following data within the electronic MEDICAL RECORD NUMBER   73 year old female presenting with above-stated history and physical exam secondary to nausea and vomiting.  Considered possibility of intra-abdominal pathology and as such appropriate laboratory data.  Patient noted to be hypokalemic with a potassium of 3.2 lipase elevated 88 however no other remarkable labs noted.  CT scan of the abdomen pelvis revealed no acute intra-abdominal pathology.  Patient given multiple doses of IV Zofran and Phenergan in the emergency department with continued nausea and vomiting.  Patient did not have any diarrhea while in the emergency department and as such stool sample was not obtained.  Was ordered which revealed no no clear etiology for the patient's symptoms identified  SECRET KRISTENSEN was evaluated in Emergency Department on 06/10/2018 for the symptoms described in the history of present illness. She was evaluated in the context of the global COVID-19 pandemic, which necessitated consideration that the patient might be at risk for infection with the SARS-CoV-2 virus that causes COVID-19. Institutional protocols and algorithms that pertain to the evaluation of patients at risk for COVID-19 are in a state of rapid change based on information released by regulatory bodies including the CDC and federal and state organizations. These policies and algorithms were followed during the patient's care in the  ED.    ____________________________________________  FINAL CLINICAL IMPRESSION(S) / ED DIAGNOSES  Final diagnoses:  Intractable nausea and vomiting     MEDICATIONS GIVEN DURING THIS VISIT:  Medications  promethazine (PHENERGAN) injection 6.25 mg (has no administration in time range)  ondansetron (ZOFRAN) injection 4 mg (4 mg Intravenous Given 06/10/18 0300)  promethazine (PHENERGAN) injection 6.25 mg (6.25 mg Intravenous Given 06/10/18 0320)  iohexol (OMNIPAQUE) 300 MG/ML solution 100 mL (100 mLs Intravenous Contrast Given 06/10/18 0402)  ondansetron (ZOFRAN) injection 4 mg (4 mg Intravenous Given 06/10/18 4098)     ED Discharge Orders    None  Note:  This document was prepared using Dragon voice recognition software and may include unintentional dictation errors.   Darci Current, MD 06/10/18 [phone]

## 2018-06-10 NOTE — ED Notes (Addendum)
ED TO INPATIENT HANDOFF REPORT  ED Nurse Name and Phone #: Britten Parady [phone]  S Name/Age/Gender Alison Price 73 y.o. female Room/Bed: ED01A/ED01A  Code Status   Code Status: Full Code  Home/SNF/Other Home Patient oriented to: self, place, time and situation Is this baseline? Yes   Triage Complete: Triage complete  Chief Complaint Shortness of Breath  Triage Note Pt presents to ED from home via EMS with sore throat and vomiting for the past 2 days, diarrhea and fever today. Denies pain. No increased work of breathing noted at this time. +nausa currently. Pt alert and answering questions without difficulty.    Allergies Allergies  Allergen Reactions  . Codeine Nausea And Vomiting  . Sulfa Antibiotics Other (See Comments)    Trudie Buckler SYNDROME    Level of Care/Admitting Diagnosis ED Disposition    ED Disposition Condition Comment   Admit  Hospital Area: [facility] [[id number]]  Level of Care: Med-Surg [16]  Covid Evaluation: N/A  Diagnosis: Intractable nausea and vomiting [720114]  Admitting Physician: Hannah Beat [[id number]]  Attending Physician: Hannah Beat [[id number]]  PT Class (Do Not Modify): Observation [104]  PT Acc Code (Do Not Modify): Observation [10022]       B Medical/Surgery History Past Medical History:  Diagnosis Date  . Anemia   . Arthritis   . Chronic pain   . GERD (gastroesophageal reflux disease)   . Hypertension   . Osteoporosis   . Stevens-Johnson syndrome [facility])    Past Surgical History:  Procedure Laterality Date  . ABDOMINAL HYSTERECTOMY     partial  . BACK SURGERY    . FOOT SURGERY    . HAND SURGERY     THUMB REPAIR  . HEMORRHOID SURGERY    . INTRAMEDULLARY (IM) NAIL INTERTROCHANTERIC Left 08/06/2017   Procedure: INTRAMEDULLARY (IM) NAIL INTERTROCHANTRIC;  Surgeon: Signa Kell, MD;  Location: [facility] ORS;  Service: Orthopedics;  Laterality: Left;  . POSTERIOR REPAIR       A IV  Location/Drains/Wounds Patient Lines/Drains/Airways Status   Active Line/Drains/Airways    Name:   Placement date:   Placement time:   Site:   Days:   Peripheral IV 06/10/18 Left Antecubital   06/10/18    0258    Antecubital   less than 1   Incision (Closed) 08/06/17 Hip Left   08/06/17    1420     308          Intake/Output Last 24 hours No intake or output data in the 24 hours ending 06/10/18 0726  Labs/Imaging Results for orders placed or performed during the hospital encounter of 06/10/18 (from the past 48 hour(s))  CBC with Differential     Status: Abnormal   Collection Time: 06/10/18  3:06 AM  Result Value Ref Range   WBC 4.8 4.0 - 10.5 K/uL   RBC 4.00 3.87 - 5.11 MIL/uL   Hemoglobin 12.4 12.0 - 15.0 g/dL   HCT 78.2 (L) 95.6 - 21.3 %   MCV 89.0 80.0 - 100.0 fL   MCH 31.0 26.0 - 34.0 pg   MCHC 34.8 30.0 - 36.0 g/dL   RDW 08.6 57.8 - 46.9 %   Platelets 188 150 - 400 K/uL   nRBC 0.0 0.0 - 0.2 %   Neutrophils Relative % 74 %   Neutro Abs 3.5 1.7 - 7.7 K/uL   Lymphocytes Relative 18 %   Lymphs Abs 0.9 0.7 - 4.0 K/uL   Monocytes Relative 4 %  Monocytes Absolute 0.2 0.1 - 1.0 K/uL   Eosinophils Relative 0 %   Eosinophils Absolute 0.0 0.0 - 0.5 K/uL   Basophils Relative 1 %   Basophils Absolute 0.0 0.0 - 0.1 K/uL   Immature Granulocytes 3 %   Abs Immature Granulocytes 0.12 (H) 0.00 - 0.07 K/uL    Comment: Performed at [facility], [address] [place], [place] [address]  Comprehensive metabolic panel     Status: Abnormal   Collection Time: 06/10/18  3:06 AM  Result Value Ref Range   Sodium 138 135 - 145 mmol/L   Potassium 3.2 (L) 3.5 - 5.1 mmol/L   Chloride 100 98 - 111 mmol/L   CO2 27 22 - 32 mmol/L   Glucose, Bld 166 (H) 70 - 99 mg/dL   BUN 11 8 - 23 mg/dL   Creatinine, Ser 5.62 0.44 - 1.00 mg/dL   Calcium 9.1 8.9 - 56.3 mg/dL   Total Protein 7.8 6.5 - 8.1 g/dL   Albumin 4.3 3.5 - 5.0 g/dL   AST 27 15 - 41 U/L   ALT 12 0 - 44 U/L   Alkaline  Phosphatase 92 38 - 126 U/L   Total Bilirubin 0.9 0.3 - 1.2 mg/dL   GFR calc non Af Amer >60 >60 mL/min   GFR calc Af Amer >60 >60 mL/min   Anion gap 11 5 - 15    Comment: Performed at [facility], [address] [place], [place] [address]  Lipase, blood     Status: Abnormal   Collection Time: 06/10/18  3:06 AM  Result Value Ref Range   Lipase 88 (H) 11 - 51 U/L    Comment: Performed at [facility], [address] [place], [place] [address]  Troponin I - ONCE - STAT     Status: None   Collection Time: 06/10/18  3:06 AM  Result Value Ref Range   Troponin I <0.03 <0.03 ng/mL    Comment: Performed at [facility], [address] [place], [place] [address]  SARS Coronavirus 2 [facility] order, Performed in [facility] hospital lab)     Status: None   Collection Time: 06/10/18  3:07 AM  Result Value Ref Range   SARS Coronavirus 2 NEGATIVE NEGATIVE    Comment: (NOTE) If result is NEGATIVE SARS-CoV-2 target nucleic acids are NOT DETECTED. The SARS-CoV-2 RNA is generally detectable in upper and lower  respiratory specimens during the acute phase of infection. The lowest  concentration of SARS-CoV-2 viral copies this assay can detect is 250  copies / mL. A negative result does not preclude SARS-CoV-2 infection  and should not be used as the sole basis for treatment or other  patient management decisions.  A negative result may occur with  improper specimen collection / handling, submission of specimen other  than nasopharyngeal swab, presence of viral mutation(s) within the  areas targeted by this assay, and inadequate number of viral copies  (<250 copies / mL). A negative result must be combined with clinical  observations, patient history, and epidemiological information. If result is POSITIVE SARS-CoV-2 target nucleic acids are DETECTED. The SARS-CoV-2 RNA is generally detectable in upper and lower  respiratory specimens dur ing the acute phase of  infection.  Positive  results are indicative of active infection with SARS-CoV-2.  Clinical  correlation with patient history and other diagnostic information is  necessary to determine patient infection status.  Positive results do  not rule out bacterial infection or  co-infection with other viruses. If result is PRESUMPTIVE POSTIVE SARS-CoV-2 nucleic acids MAY BE PRESENT.   A presumptive positive result was obtained on the submitted specimen  and confirmed on repeat testing.  While 2019 novel coronavirus  (SARS-CoV-2) nucleic acids may be present in the submitted sample  additional confirmatory testing may be necessary for epidemiological  and / or clinical management purposes  to differentiate between  SARS-CoV-2 and other Sarbecovirus currently known to infect humans.  If clinically indicated additional testing with an alternate test  methodology [phone]) is advised. The SARS-CoV-2 RNA is generally  detectable in upper and lower respiratory sp ecimens during the acute  phase of infection. The expected result is Negative. Fact Sheet for Patients:  BoilerBrush.com.cy Fact Sheet for Healthcare Providers: https://pope.com/ This test is not yet approved or cleared by the [place] FDA and has been authorized for detection and/or diagnosis of SARS-CoV-2 by FDA under an Emergency Use Authorization (EUA).  This EUA will remain in effect (meaning this test can be used) for the duration of the COVID-19 declaration under Section 564(b)(1) of the Act, 21 U.S.C. section 360bbb-3(b)(1), unless the authorization is terminated or revoked sooner. Performed at [facility], [address] [place], [place] [address]   Urinalysis, Complete w Microscopic     Status: Abnormal   Collection Time: 06/10/18  4:45 AM  Result Value Ref Range   Color, Urine YELLOW (A) YELLOW   APPearance CLOUDY (A) CLEAR   Specific Gravity, Urine 1.012 1.005  - 1.030   pH 8.0 5.0 - 8.0   Glucose, UA NEGATIVE NEGATIVE mg/dL   Hgb urine dipstick NEGATIVE NEGATIVE   Bilirubin Urine NEGATIVE NEGATIVE   Ketones, ur NEGATIVE NEGATIVE mg/dL   Protein, ur NEGATIVE NEGATIVE mg/dL   Nitrite NEGATIVE NEGATIVE   Leukocytes,Ua NEGATIVE NEGATIVE   RBC / HPF 6-10 0 - 5 RBC/hpf   WBC, UA NONE SEEN 0 - 5 WBC/hpf   Bacteria, UA RARE (A) NONE SEEN   Squamous Epithelial / LPF 0-5 0 - 5   Mucus PRESENT    Amorphous Crystal PRESENT     Comment: Performed at [facility], [address], [place], [place] [address]   Ct Abdomen Pelvis W Contrast  Result Date: 06/10/2018 CLINICAL DATA:  73 year old female with sore throat and vomiting for 2 days, diarrhea and fever. Negative COVID-19 today. EXAM: CT ABDOMEN AND PELVIS WITH CONTRAST TECHNIQUE: Multidetector CT imaging of the abdomen and pelvis was performed using the standard protocol following bolus administration of intravenous contrast. CONTRAST:  OMNIPAQUE IOHEXOL 300 MG/ML  SOLN COMPARISON:  CT Abdomen and Pelvis 11/10/2011. FINDINGS: Lower chest: Peribronchial and patchy bilateral lower lung pulmonary ground-glass opacity, although chronic to a degree. Still, the degree of abnormal opacity and extent is progressed since the 2013 CT. No superimposed pleural or pericardial effusion. Hepatobiliary: Negative liver and gallbladder. Pancreas: Negative. Spleen: Negative. Adrenals/Urinary Tract: Normal adrenal glands. Bulky staghorn calculus in the left renal lower pole measures up to 2 centimeters. Additional punctate left upper pole calculus. There is no left hydronephrosis or perinephric stranding. The proximal left ureter appears decompressed. Smaller right lower pole developing staghorn calculus, 11 millimeters. Additional punctate right nephrolithiasis. No right hydronephrosis or perinephric stranding. Proximal right ureter appears normal. Small chronic bilateral renal cysts are stable and benign. Diminutive  and unremarkable urinary bladder. Stomach/Bowel: No dilated large or small bowel loops. Redundant large bowel with areas of retained low-density stool, primarily in the transverse colon. It appears that  the descending colon crosses midline to the right at the pelvic inlet. Also the cecum and ileocecal valve are difficult to delineate. The stomach is fairly decompressed. There is no free air or free fluid identified. The duodenum does cross the midline as expected. Vascular/Lymphatic: Aortoiliac calcified atherosclerosis. Major arterial structures are patent. Portal venous system is patent. No lymphadenopathy. Reproductive: Surgically absent. Other: No pelvic free fluid. Musculoskeletal: Previous lower lumbar fusion. Chronic appearing mild L1 and L3 compression fractures or bulky Schmorl's nodes. Similar superior endplate Schmorl's node at T11. Osteopenia. Previous proximal left femur ORIF. No acute osseous abnormality identified. IMPRESSION: 1. No inflammatory process identified in the abdomen or pelvis. 2. The large bowel is redundant and difficult to delineate in areas with intermittent low-density retained stool. But there is no evidence of bowel obstruction, and no focal bowel inflammation identified. 3. Partially visible abnormal peribronchial and ground-glass opacity at both lung bases, although this was present to a lesser extent in 2013. Consider chronic Aspiration versus a progressed chronic lung disease. Electronically Signed   By: Odessa Fleming M.D.   On: 06/10/2018 04:50   Dg Chest Portable 1 View  Result Date: 06/10/2018 CLINICAL DATA:  Shortness of breath EXAM: PORTABLE CHEST 1 VIEW COMPARISON:  08/05/2017 FINDINGS: Heart is normal size. Patchy bilateral lower lobe airspace opacities are similar to prior studies, likely scarring. Gas under the right hemidiaphragm felt to be within colon when compared to prior studies. No effusions. No acute bony abnormality. IMPRESSION: Bibasilar airspace opacities are  similar to prior studies dating back to January 2019, felt represent scarring. No active disease. Electronically Signed   By: Charlett Nose M.D.   On: 06/10/2018 03:31    Pending Labs Unresulted Labs (From admission, onward)    Start     Ordered   06/11/18 0500  Basic metabolic panel  Tomorrow morning,   STAT     06/10/18 0643   06/11/18 0500  CBC  Tomorrow morning,   STAT     06/10/18 0643          Vitals/Pain Today's Vitals   06/10/18 0301 06/10/18 0439 06/10/18 0500 06/10/18 0600  BP:  (!) 212/101 (!) 223/104 (!) 208/98  Pulse:  85 88 80  Resp:  Temp:      TempSrc:      SpO2:  98% 96% 98%  Weight: 63.5 kg     Height:  (1.702 m)     PainSc:  0-No pain  0-No pain    Isolation Precautions Droplet and Contact precautions  Medications Medications  promethazine (PHENERGAN) injection 6.25 mg (has no administration in time range)  butalbital-acetaminophen-caffeine (FIORICET) 50-325-40 MG per tablet 1 tablet (has no administration in time range)  morphine (MS CONTIN) 12 hr tablet 60 mg (has no administration in time range)  oxyCODONE-acetaminophen (PERCOCET/ROXICET) 5-325 MG per tablet 1 tablet (has no administration in time range)  Fenofibric Acid CPDR 135 mg (has no administration in time range)  venlafaxine (EFFEXOR) tablet 75 mg (has no administration in time range)  alum & mag hydroxide-simeth (MAALOX/MYLANTA) 200-200-20 MG/5ML suspension 30 mL (has no administration in time range)  meclizine (ANTIVERT) tablet 25 mg (has no administration in time range)  ondansetron (ZOFRAN-ODT) disintegrating tablet 4 mg (has no administration in time range)  ferrous sulfate tablet 325 mg (has no administration in time range)  tiZANidine (ZANAFLEX) tablet 4 mg (has no administration in time range)  conjugated estrogens (PREMARIN) vaginal cream 1 Applicatorful (has no  administration in time range)  cholecalciferol (VITAMIN D3) tablet 6,000 Units (has no administration in  time range)  multivitamin with minerals tablet 1 tablet (has no administration in time range)  loratadine (CLARITIN) tablet 10 mg (has no administration in time range)  Ipratropium-Albuterol (COMBIVENT) respimat 2 puff (has no administration in time range)  enoxaparin (LOVENOX) injection 40 mg (has no administration in time range)  0.9 %  sodium chloride infusion (has no administration in time range)  acetaminophen (TYLENOL) tablet 650 mg (has no administration in time range)    Or  acetaminophen (TYLENOL) suppository 650 mg (has no administration in time range)  magnesium hydroxide (MILK OF MAGNESIA) suspension 30 mL (has no administration in time range)  promethazine (PHENERGAN) tablet 12.5 mg (has no administration in time range)  pantoprazole (PROTONIX) injection 40 mg (has no administration in time range)  ondansetron (ZOFRAN) injection 4 mg (4 mg Intravenous Given 06/10/18 0300)  promethazine (PHENERGAN) injection 6.25 mg (6.25 mg Intravenous Given 06/10/18 0320)  iohexol (OMNIPAQUE) 300 MG/ML solution 100 mL (100 mLs Intravenous Contrast Given 06/10/18 0402)  ondansetron (ZOFRAN) injection 4 mg (4 mg Intravenous Given 06/10/18 [id number])    Mobility walks with person assist Low fall risk   Focused Assessments    R Recommendations: See Admitting Provider Note  Report given to:   Additional Notes:  B/p elevated, giving pain meds as likely related to pain and has not had her pain meds due to vomiting

## 2018-06-10 NOTE — ED Notes (Addendum)
Pt asking for ginger ale. States she would also like an emesis bag because she feels like she needs to vomit. Provided for pt comfort and safety and will continue to assess.

## 2018-06-10 NOTE — ED Notes (Signed)
Pt given gingerale at this time per MD.

## 2018-06-10 NOTE — ED Notes (Signed)
Pt reports she is feeling nauseated. States phenergan makes her feel anxious and would rather have zofran at this time. MD aware.

## 2018-06-10 NOTE — ED Notes (Signed)
Patient assisted to bathroom and back to bed.

## 2018-06-10 NOTE — H&P (Addendum)
[facility] [place] at [facility]   PATIENT NAME: Alison Price    MR#:  [id number]  DATE OF BIRTH:  1945/09/06  DATE OF ADMISSION:  06/10/2018  PRIMARY CARE PHYSICIAN: Jaclyn Shaggy, MD   REQUESTING/REFERRING PHYSICIAN: Bayard Males, MD  CHIEF COMPLAINT:  Intractable nausea and vomiting   HISTORY OF PRESENT ILLNESS:  Alison Price  is a 73 y.o. Caucasian female with a known history of multiple medical problems that will be mentioned below, who presented to the emergency room with concerns of intractable nausea and vomiting for the last 5 days with occasional epigastric and generalized abdominal pain without diarrhea.  No bilious vomitus or coffee-ground or bloody emesis.  She she had a T-max of 100.1 without chills.  She admitted to mild cough without wheezing dyspnea.  She has chronic rhinorrhea and occasional nasal congestion.  No chest pain or palpitations.  No orthopnea or paroxysmal nocturnal dyspnea.  Upon arrival to the ER, blood pressure was 158/87 with a pulse of 78 and otherwise normal vital signs.  Labs revealed mild hypokalemia with potassium of 3.2 and her BUN and creatinine were 11 and 0.63 with troponin I less than 0.03 and unremarkable CBC and UA.  Abdominal and pelvic CT scan revealed no acute abnormalities as mentioned below.  She had a negative COVID-19 test in the ER.  The patient was given 4 mg of IV Zofran twice as well as 6.25 mg of IV Phenergan twice and continued to have nausea and vomiting.  She will be admitted to an observation medically monitored bed for further evaluation and management. PAST MEDICAL HISTORY:   Past Medical History:  Diagnosis Date  . Anemia   . Arthritis   . Chronic pain   . GERD (gastroesophageal reflux disease)   . Hypertension   . Osteoporosis   . Stevens-Johnson syndrome (HCC)     PAST SURGICAL HISTORY:   Past Surgical History:  Procedure Laterality Date  . ABDOMINAL HYSTERECTOMY     partial  . BACK SURGERY    . FOOT SURGERY    . HAND SURGERY     THUMB REPAIR  . HEMORRHOID SURGERY    . INTRAMEDULLARY (IM) NAIL INTERTROCHANTERIC Left 08/06/2017   Procedure: INTRAMEDULLARY (IM) NAIL INTERTROCHANTRIC;  Surgeon: Signa Kell, MD;  Location: [facility] ORS;  Service: Orthopedics;  Laterality: Left;  . POSTERIOR REPAIR      SOCIAL HISTORY:   Social History   Tobacco Use  . Smoking status: Former Smoker    Last attempt to quit: 02/27/1986    Years since quitting: 32.3  . Smokeless tobacco: Never Used  Substance Use Topics  . Alcohol use: No    FAMILY HISTORY:   Family History  Problem Relation Age of Onset  . Diabetes Paternal Grandmother   . Uterine cancer Maternal Aunt   . Stomach cancer Maternal Aunt   . Breast cancer Neg Hx   . Ovarian cancer Neg Hx   . Colon cancer Neg Hx   . Heart disease Neg Hx     DRUG ALLERGIES:   Allergies  Allergen Reactions  . Codeine Nausea And Vomiting  . Sulfa Antibiotics Other (See Comments)    STEVEN JOHNSON SYNDROME    REVIEW OF SYSTEMS:   ROS As per history of present illness. All pertinent systems were reviewed above. Constitutional,  HEENT, cardiovascular, respiratory, GI, GU, musculoskeletal, neuro, psychiatric, endocrine,  integumentary and hematologic systems were reviewed and are otherwise  negative/unremarkable except for positive findings  mentioned above in the HPI.   MEDICATIONS AT HOME:   Prior to Admission medications   Medication Sig Start Date End Date Taking? Authorizing Provider  alum & mag hydroxide-simeth (MAALOX PLUS) 400-400-40 MG/5ML suspension Take 30 mLs by mouth every 4 (four) hours as needed.    [provider]  aspirin EC 81 MG tablet Take 81 mg by mouth daily.    [provider]  butalbital-acetaminophen-caffeine (FIORICET, ESGIC) 50-325-40 MG tablet Take 1 tablet by mouth every 6 (six) hours as needed for headache or migraine.    [provider]   Cholecalciferol (VITAMIN D) 2000 units CAPS Take 6,000 Units by mouth daily.     [provider]  Choline Fenofibrate (FENOFIBRIC ACID) 135 MG CPDR Take 135 mg by mouth daily.     [provider]  conjugated estrogens (PREMARIN) vaginal cream Place 1 Applicatorful vaginally See admin instructions. Daily on Sunday and Wednesday    [provider]  dimenhyDRINATE (DRAMAMINE) 50 MG tablet Take 50 mg by mouth every 8 (eight) hours as needed. N/V (usually takes with Morphine and oxycodone)    [provider]  etodolac (LODINE) 400 MG tablet Take 400 mg by mouth 2 (two) times daily.     [provider]  ferrous sulfate 325 (65 FE) MG tablet Take 325 mg by mouth 3 (three) times daily with meals.    [provider]  fexofenadine (ALLEGRA) 180 MG tablet Take 180 mg by mouth daily.    [provider]  Fluticasone-Salmeterol (ADVAIR) 250-50 MCG/DOSE AEPB Inhale 1 puff into the lungs 2 (two) times daily.    [provider]  Ipratropium-Albuterol (COMBIVENT RESPIMAT) 20-100 MCG/ACT AERS respimat Inhale 2 puffs into the lungs every 6 (six) hours as needed for wheezing or shortness of breath.    [provider]  meclizine (ANTIVERT) 25 MG tablet Take 25 mg by mouth 2 (two) times daily as needed for dizziness.    [provider]  morphine (MS CONTIN) 60 MG 12 hr tablet Take 1 tablet (60 mg total) by mouth 3 (three) times daily. 08/19/17   Sharee Holster, NP  Multiple Vitamin (MULTIVITAMIN) tablet Take 1 tablet by mouth daily.    [provider]  ondansetron (ZOFRAN ODT) 4 MG disintegrating tablet Take 1 tablet (4 mg total) by mouth every 8 (eight) hours as needed for nausea or vomiting. 03/27/18   Emily Filbert, MD  ondansetron (ZOFRAN) 4 MG tablet Take 1 tablet (4 mg total) by mouth every 8 (eight) hours as needed for nausea or vomiting. 10/25/16   Shaune Pollack, MD  oxyCODONE-acetaminophen (PERCOCET/ROXICET) 5-325 MG  tablet Take 1 tablet by mouth every 4 (four) hours as needed for moderate pain or severe pain.    [provider]  pantoprazole (PROTONIX) 40 MG tablet Take 40 mg by mouth daily.     [provider]  tiZANidine (ZANAFLEX) 4 MG tablet Take 4 mg by mouth every 6 (six) hours as needed for muscle spasms.  01/17/15   [provider]  venlafaxine (EFFEXOR) 75 MG tablet Take 75 mg by mouth daily.    [provider]  vitamin C (ASCORBIC ACID) 500 MG tablet Take 500 mg by mouth 3 (three) times daily. anemia- give with ferrous sulfate to potentiate iron absorption    [provider]      VITAL SIGNS:  Blood pressure (!) 208/98, pulse 80, temperature 98.2 F (36.8 C), temperature source Oral, resp. rate 16, height   (1.702 m), weight 63.5 kg, SpO2 98 %.  PHYSICAL EXAMINATION:  Physical Exam  GENERAL:  73 y.o.-year-old Caucasian patient lying in the bed with no acute distress.  EYES: Pupils equal, round, reactive to light and accommodation. No scleral icterus. Extraocular muscles intact.  HEENT: Head atraumatic, normocephalic. Oropharynx and nasopharynx clear.  NECK:  Supple, no jugular venous distention. No thyroid enlargement, no tenderness.  LUNGS: Normal breath sounds bilaterally, no wheezing, rales,rhonchi or crepitation. No use of accessory muscles of respiration.  CARDIOVASCULAR: Regular rate and rhythm, S1, S2 normal. No murmurs, rubs, or gallops.  ABDOMEN: Soft, nondistended, with mild epigastric and left upper quadrant abdominal tenderness without rebound tenderness guarding or rigidity.  Bowel sounds present. No organomegaly or mass.  EXTREMITIES: No pedal edema, cyanosis, or clubbing.  NEUROLOGIC: Cranial nerves II through XII are intact. Muscle strength 5/5 in all extremities. Sensation intact. Gait not checked.  PSYCHIATRIC: The patient is alert and oriented x 3.  Normal affect and good eye contact. SKIN: No obvious rash, lesion, or ulcer.    LABORATORY PANEL:   CBC Recent Labs  Lab 06/10/18 0306  WBC 4.8  HGB 12.4  HCT 35.6*  PLT 188   ------------------------------------------------------------------------------------------------------------------  Chemistries  Recent Labs  Lab 06/10/18 0306  NA 138  K 3.2*  CL 100  CO2 27  GLUCOSE 166*  BUN 11  CREATININE 0.63  CALCIUM 9.1  AST 27  ALT 12  ALKPHOS 92  BILITOT 0.9   ------------------------------------------------------------------------------------------------------------------  Cardiac Enzymes Recent Labs  Lab 06/10/18 0306  TROPONINI <0.03   ------------------------------------------------------------------------------------------------------------------  RADIOLOGY:  Ct Abdomen Pelvis W Contrast  Result Date: 06/10/2018 CLINICAL DATA:  73 year old female with sore throat and vomiting for 2 days, diarrhea and fever. Negative COVID-19 today. EXAM: CT ABDOMEN AND PELVIS WITH CONTRAST TECHNIQUE: Multidetector CT imaging of the abdomen and pelvis was performed using the standard protocol following bolus administration of intravenous contrast. CONTRAST:  OMNIPAQUE IOHEXOL 300 MG/ML  SOLN COMPARISON:  CT Abdomen and Pelvis 11/10/2011. FINDINGS: Lower chest: Peribronchial and patchy bilateral lower lung pulmonary ground-glass opacity, although chronic to a degree. Still, the degree of abnormal opacity and extent is progressed since the 2013 CT. No superimposed pleural or pericardial effusion. Hepatobiliary: Negative liver and gallbladder. Pancreas: Negative. Spleen: Negative. Adrenals/Urinary Tract: Normal adrenal glands. Bulky staghorn calculus in the left renal lower pole measures up to 2 centimeters. Additional punctate left upper pole calculus. There is no left hydronephrosis or perinephric stranding. The proximal left ureter appears decompressed. Smaller right lower pole developing staghorn calculus, 11 millimeters. Additional punctate right  nephrolithiasis. No right hydronephrosis or perinephric stranding. Proximal right ureter appears normal. Small chronic bilateral renal cysts are stable and benign. Diminutive and unremarkable urinary bladder. Stomach/Bowel: No dilated large or small bowel loops. Redundant large bowel with areas of retained low-density stool, primarily in the transverse colon. It appears that the descending colon crosses midline to the right at the pelvic inlet. Also the cecum and ileocecal valve are difficult to delineate. The stomach is fairly decompressed. There is no free air or free fluid identified. The duodenum does cross the midline as expected. Vascular/Lymphatic: Aortoiliac calcified atherosclerosis. Major arterial structures are patent. Portal venous system is patent. No lymphadenopathy. Reproductive: Surgically absent. Other: No pelvic free fluid. Musculoskeletal: Previous lower lumbar fusion. Chronic appearing mild L1 and L3 compression fractures or bulky Schmorl's nodes. Similar superior endplate Schmorl's node at T11. Osteopenia. Previous proximal left femur ORIF. No acute osseous abnormality identified. IMPRESSION:  1. No inflammatory process identified in the abdomen or pelvis. 2. The large bowel is redundant and difficult to delineate in areas with intermittent low-density retained stool. But there is no evidence of bowel obstruction, and no focal bowel inflammation identified. 3. Partially visible abnormal peribronchial and ground-glass opacity at both lung bases, although this was present to a lesser extent in 2013. Consider chronic Aspiration versus a progressed chronic lung disease. Electronically Signed   By: Odessa FlemingH  Hall M.D.   On: 06/10/2018 04:50   Dg Chest Portable 1 View  Result Date: 06/10/2018 CLINICAL DATA:  Shortness of breath EXAM: PORTABLE CHEST 1 VIEW COMPARISON:  08/05/2017 FINDINGS: Heart is normal size. Patchy bilateral lower lobe airspace opacities are similar to prior studies, likely scarring.  Gas under the right hemidiaphragm felt to be within colon when compared to prior studies. No effusions. No acute bony abnormality. IMPRESSION: Bibasilar airspace opacities are similar to prior studies dating back to January 2019, felt represent scarring. No active disease. Electronically Signed   By: Charlett NoseKevin  Dover M.D.   On: 06/10/2018 03:31      IMPRESSION AND PLAN:   #1.  Intractable nausea and vomiting with subsequent volume depletion.  The patient will be admitted to the medical monitored observation bed.  We will place her on PRN Zofran, Phenergan and add IV Reglan as well as hydration with IV normal saline.  She will be placed on clear liquids and will advance as tolerated.  We will add IV PPI therapy with Protonix as this could be related to mild acute gastritis.  2.  Abdominal pain.  This is likely secondary to mild acute gastritis.  Abdominal pelvic CT scan revealed no acute abnormalities.  3.  Hypokalemia.  Her potassium will be replaced and magnesium level will be checked.  4.  Hypertension now uncontrolled with hypertensive urgency.  We will increase her amlodipine dose and place her on p.r.n. IV Labetalol.  5.  Chronic pain.  MS Contin will be continued.  6.  Depression.  Her Effexor will be continued  7.  DVT prophylaxis.  Subcutaneous Lovenox    All the records are reviewed and case discussed with ED provider. The plan of care was discussed in details with the patient . I answered all questions. The patient agreed to proceed with the above mentioned plan. Further management will depend upon Price course.   CODE STATUS: Full code  TOTAL TIME TAKING CARE OF THIS PATIENT: 50 minutes.    Hannah BeatJan A Mansy M.D on 06/10/2018 at 7:42 AM  Pager - [phone]  After 6pm go to [email or web address] - Social research officer, governmentpassword EPAS [facility]  [facility] [place] [facility]  Office  [phone]  CC: Primary care physician; Jaclyn Shaggyate, Denny C, MD   Note: This dictation was prepared with Dragon  dictation along with smaller phrase technology. Any transcriptional errors that result from this process are unintentional.

## 2018-06-11 DIAGNOSIS — I10 Essential (primary) hypertension: Secondary | ICD-10-CM | POA: Diagnosis not present

## 2018-06-11 DIAGNOSIS — R1013 Epigastric pain: Secondary | ICD-10-CM | POA: Diagnosis not present

## 2018-06-11 DIAGNOSIS — E869 Volume depletion, unspecified: Secondary | ICD-10-CM | POA: Diagnosis not present

## 2018-06-11 DIAGNOSIS — Z20828 Contact with and (suspected) exposure to other viral communicable diseases: Secondary | ICD-10-CM | POA: Diagnosis not present

## 2018-06-11 DIAGNOSIS — R109 Unspecified abdominal pain: Secondary | ICD-10-CM | POA: Diagnosis not present

## 2018-06-11 DIAGNOSIS — I160 Hypertensive urgency: Secondary | ICD-10-CM | POA: Diagnosis not present

## 2018-06-11 DIAGNOSIS — R05 Cough: Secondary | ICD-10-CM | POA: Diagnosis not present

## 2018-06-11 DIAGNOSIS — K5900 Constipation, unspecified: Secondary | ICD-10-CM | POA: Diagnosis not present

## 2018-06-11 DIAGNOSIS — R0602 Shortness of breath: Secondary | ICD-10-CM | POA: Diagnosis not present

## 2018-06-11 DIAGNOSIS — E876 Hypokalemia: Secondary | ICD-10-CM | POA: Diagnosis not present

## 2018-06-11 DIAGNOSIS — R112 Nausea with vomiting, unspecified: Secondary | ICD-10-CM | POA: Diagnosis not present

## 2018-06-11 LAB — CBC
HCT: 33.9 % — ABNORMAL LOW (ref 36.0–46.0)
Hemoglobin: 11.4 g/dL — ABNORMAL LOW (ref 12.0–15.0)
MCH: 31.1 pg (ref 26.0–34.0)
MCHC: 33.6 g/dL (ref 30.0–36.0)
MCV: 92.4 fL (ref 80.0–100.0)
Platelets: 171 10*3/uL (ref 150–400)
RBC: 3.67 MIL/uL — ABNORMAL LOW (ref 3.87–5.11)
RDW: 14.1 % (ref 11.5–15.5)
WBC: 7 10*3/uL (ref 4.0–10.5)
nRBC: 0 % (ref 0.0–0.2)

## 2018-06-11 LAB — LIPID PANEL
Cholesterol: 240 mg/dL — ABNORMAL HIGH (ref 0–200)
HDL: 79 mg/dL (ref >40)
LDL Cholesterol: 150 mg/dL — ABNORMAL HIGH (ref 0–99)
Total CHOL/HDL Ratio: 3 RATIO
Triglycerides: 53 mg/dL (ref <150)
VLDL: 11 mg/dL (ref 0–40)

## 2018-06-11 LAB — BASIC METABOLIC PANEL
Anion gap: 11 (ref 5–15)
BUN: 18 mg/dL (ref 8–23)
CO2: 28 mmol/L (ref 22–32)
Calcium: 8.7 mg/dL — ABNORMAL LOW (ref 8.9–10.3)
Chloride: 98 mmol/L (ref 98–111)
Creatinine, Ser: 0.76 mg/dL (ref 0.44–1.00)
GFR calc Af Amer: >60 mL/min (ref >60)
GFR calc non Af Amer: >60 mL/min (ref >60)
Glucose, Bld: 106 mg/dL — ABNORMAL HIGH (ref 70–99)
Potassium: 4.1 mmol/L (ref 3.5–5.1)
Sodium: 137 mmol/L (ref 135–145)

## 2018-06-11 LAB — MAGNESIUM: Magnesium: 2.2 mg/dL (ref 1.7–2.4)

## 2018-06-11 MED ORDER — SENNOSIDES-DOCUSATE SODIUM 8.6-50 MG PO TABS
1.0000 | ORAL_TABLET | Freq: Two times a day (BID) | ORAL | Status: DC
  Administered 2018-06-11 – 2018-06-12 (×3): 1 via ORAL
  Filled 2018-06-11 (×3): qty 1

## 2018-06-11 MED ORDER — LABETALOL HCL 5 MG/ML IV SOLN: 20.0000 mg | INTRAVENOUS | Status: DC | PRN

## 2018-06-11 MED ORDER — AMLODIPINE BESYLATE 10 MG PO TABS
10.0000 mg | ORAL_TABLET | Freq: Every day | ORAL | Status: DC
  Administered 2018-06-12: 08:00:00 10 mg via ORAL
  Filled 2018-06-11 (×2): qty 1

## 2018-06-11 MED ORDER — POLYETHYLENE GLYCOL 3350 17 G PO PACK
17.0000 g | PACK | Freq: Every day | ORAL | Status: DC
  Administered 2018-06-11 – 2018-06-12 (×2): 17 g via ORAL
  Filled 2018-06-11 (×2): qty 1

## 2018-06-11 MED ORDER — FENOFIBRATE 160 MG PO TABS
160.0000 mg | ORAL_TABLET | Freq: Every day | ORAL | Status: DC
  Administered 2018-06-11 – 2018-06-12 (×2): 160 mg via ORAL
  Filled 2018-06-11 (×2): qty 1

## 2018-06-11 MED ORDER — PANTOPRAZOLE SODIUM 40 MG PO TBEC
40.0000 mg | DELAYED_RELEASE_TABLET | Freq: Two times a day (BID) | ORAL | Status: DC
  Administered 2018-06-11 – 2018-06-12 (×2): 40 mg via ORAL
  Filled 2018-06-11 (×2): qty 1

## 2018-06-11 NOTE — Progress Notes (Signed)
[facility] - [place] at [facility]   PATIENT NAME: Alison Price    MR#:  [id number]  DATE OF BIRTH:  08-18-1945  SUBJECTIVE:  CHIEF COMPLAINT:   Chief Complaint  Patient presents with  . Vomiting  . Shortness of Breath  . Diarrhea   -feels terrible with headache, nausea and vomiting are much better now -Has chronic back pain  REVIEW OF SYSTEMS:  Review of Systems  Constitutional: Negative for chills, fever and malaise/fatigue.  HENT: Negative for congestion, ear discharge, hearing loss and nosebleeds.   Eyes: Negative for blurred vision and double vision.  Respiratory: Negative for cough, shortness of breath and wheezing.   Cardiovascular: Negative for chest pain and palpitations.  Gastrointestinal: Positive for abdominal pain. Negative for constipation, diarrhea, nausea and vomiting.  Genitourinary: Negative for dysuria.  Musculoskeletal: Positive for back pain and myalgias.  Neurological: Positive for headaches. Negative for dizziness, focal weakness, seizures and weakness.  Psychiatric/Behavioral: Negative for depression.    DRUG ALLERGIES:   Allergies  Allergen Reactions  . Codeine Nausea And Vomiting  . Sulfa Antibiotics Other (See Comments)    STEVEN JOHNSON SYNDROME    VITALS:  Blood pressure 115/68, pulse 78, temperature (!) 97.5 F (36.4 C), temperature source Oral, resp. rate 18, height 5\' 7"  (1.702 m), weight 63.5 kg, SpO2 97 %.  PHYSICAL EXAMINATION:  Physical Exam   GENERAL:  73 y.o.-year-old thin built, ill nourished patient lying in the bed with no acute distress.  EYES: Pupils equal, round, reactive to light and accommodation. No scleral icterus. Extraocular muscles intact.  HEENT: Head atraumatic, normocephalic. Oropharynx and nasopharynx clear.  NECK:  Supple, no jugular venous distention. No thyroid enlargement, no tenderness.  LUNGS: Normal breath sounds bilaterally, no wheezing, rales,rhonchi or crepitation. No use of  accessory muscles of respiration.  Decreased bibasilar breath sounds CARDIOVASCULAR: S1, S2 normal. No murmurs, rubs, or gallops.  ABDOMEN: Soft, nontender, nondistended. Bowel sounds present. No organomegaly or mass.  EXTREMITIES: No pedal edema, cyanosis, or clubbing.  NEUROLOGIC: Cranial nerves II through XII are intact. Muscle strength 5/5 in all extremities. Sensation intact. Gait not checked.  PSYCHIATRIC: The patient is alert and oriented x 3.  SKIN: No obvious rash, lesion, or ulcer.    LABORATORY PANEL:   CBC Recent Labs  Lab 06/11/18 0552  WBC 7.0  HGB 11.4*  HCT 33.9*  PLT 171   ------------------------------------------------------------------------------------------------------------------  Chemistries  Recent Labs  Lab 06/10/18 0306 06/11/18 0552  NA 138 137  K 3.2* 4.1  CL 100 98  CO2 27 28  GLUCOSE 166* 106*  BUN 11 18  CREATININE 0.63 0.76  CALCIUM 9.1 8.7*  MG 1.5* 2.2  AST 27  --   ALT 12  --   ALKPHOS 92  --   BILITOT 0.9  --    ------------------------------------------------------------------------------------------------------------------  Cardiac Enzymes Recent Labs  Lab 06/10/18 0306  TROPONINI <0.03   ------------------------------------------------------------------------------------------------------------------  RADIOLOGY:  Ct Abdomen Pelvis W Contrast  Result Date: 06/10/2018 CLINICAL DATA:  73 year old female with sore throat and vomiting for 2 days, diarrhea and fever. Negative COVID-19 today. EXAM: CT ABDOAnise SalvoN (62AND PELVIS WITH CONTRAST TECHNIQUE: Multidetector CTAnise Salvom<MEASUREMENTKent[place] r chest: Peribronchial and patchy bilateral lower lung pulmonary ground-glass opacity, although chronic to a degree. Still, the  degree of  abnormal opacity and extent is progressed since the 2013 CT. No superimposed pleural or pericardial effusion. Hepatobiliary: Negative liver and gallbladder. Pancreas: Negative. Spleen: Negative. Adrenals/Urinary Tract: Normal adrenal glands. Bulky staghorn calculus in the left renal lower pole measures up to 2 centimeters. Additional punctate left upper pole calculus. There is no left hydronephrosis or perinephric stranding. The proximal left ureter appears decompressed. Smaller right lower pole developing staghorn calculus, 11 millimeters. Additional punctate right nephrolithiasis. No right hydronephrosis or perinephric stranding. Proximal right ureter appears normal. Small chronic bilateral renal cysts are stable and benign. Diminutive and unremarkable urinary bladder. Stomach/Bowel: No dilated large or small bowel loops. Redundant large bowel with areas of retained low-density stool, primarily in the transverse colon. It appears that the descending colon crosses midline to the right at the pelvic inlet. Also the cecum and ileocecal valve are difficult to delineate. The stomach is fairly decompressed. There is no free air or free fluid identified. The duodenum does cross the midline as expected. Vascular/Lymphatic: Aortoiliac calcified atherosclerosis. Major arterial structures are patent. Portal venous system is patent. No lymphadenopathy. Reproductive: Surgically absent. Other: No pelvic free fluid. Musculoskeletal: Previous lower lumbar fusion. Chronic appearing mild L1 and L3 compression fractures or bulky Schmorl's nodes. Similar superior endplate Schmorl's node at T11. Osteopenia. Previous proximal left femur ORIF. No acute osseous abnormality identified. IMPRESSION: 1. No inflammatory process identified in the abdomen or pelvis. 2. The large bowel is redundant and difficult to delineate in areas with intermittent low-density retained stool. But there is no evidence of bowel obstruction, and no  focal bowel inflammation identified. 3. Partially visible abnormal peribronchial and ground-glass opacity at both lung bases, although this was present to a lesser extent in 2013. Consider chronic Aspiration versus a progressed chronic lung disease. Electronically Signed   By: Odessa Fleming M.D.   On: 06/10/2018 04:50   Dg Chest Portable 1 View  Result Date: 06/10/2018 CLINICAL DATA:  Shortness of breath EXAM: PORTABLE CHEST 1 VIEW COMPARISON:  08/05/2017 FINDINGS: Heart is normal size. Patchy bilateral lower lobe airspace opacities are similar to prior studies, likely scarring. Gas under the right hemidiaphragm felt to be within colon when compared to prior studies. No effusions. No acute bony abnormality. IMPRESSION: Bibasilar airspace opacities are similar to prior studies dating back to January 2019, felt represent scarring. No active disease. Electronically Signed   By: Charlett Nose M.D.   On: 06/10/2018 03:31    EKG:   Orders placed or performed during the hospital encounter of 06/10/18  . EKG 12-Lead  . EKG 12-Lead    ASSESSMENT AND PLAN:   73 year old female with past medical history significant for chronic abdominal pain and chronic low back pain on morphine long-acting at home, retention, osteoporosis presents from home secondary to intractable nausea and vomiting  1.  Acute gastroenteritis-COVID-19 test is negative -CT of the abdomen and pelvis showing no acute inflammatory process, redundant large bowel with retained stool.  No obstruction or ileus or inflammation identified. -Conservative management with fluids- improved symptoms -Zofran and Phenergan as needed -Currently on clear liquid diet, advance slowly today  2.  Acute on chronic abdominal pain and back pain-no acute abdominal findings on CT, patient has pain from previous surgeries and scar tissue according to her. -IV pain medications for now.  Continue her chronic MS Contin  3.  Hypokalemia and hypomagnesemia- replaced.    4.  Hypertensive urgency- on adm, but BP dropped later- so both lisinopril, Norvasc held today - also has  IV hydralazine PRN  5.  DVT prophylaxis-Lovenox  Possible discharge tomorrow   All the records are reviewed and case discussed with Care Management/Social Workerr. Management plans discussed with the patient, family and they are in agreement.  CODE STATUS: Full code  TOTAL TIME TAKING CARE OF THIS PATIENT: 36 minutes.   POSSIBLE D/C IN 2 DAYS, DEPENDING ON CLINICAL CONDITION.   Enid Baas M.D on 06/11/2018 at 12:51 PM  Between 7am to 6pm - Pager - [phone]  After 6pm go to [email or web address] - Social research officer, government  [facility]  Office  [phone]  CC: Primary care physician; Jaclyn Shaggy, MD

## 2018-06-12 DIAGNOSIS — G8929 Other chronic pain: Secondary | ICD-10-CM | POA: Diagnosis not present

## 2018-06-12 DIAGNOSIS — R51 Headache: Secondary | ICD-10-CM | POA: Diagnosis not present

## 2018-06-12 DIAGNOSIS — E876 Hypokalemia: Secondary | ICD-10-CM | POA: Diagnosis not present

## 2018-06-12 DIAGNOSIS — R0602 Shortness of breath: Secondary | ICD-10-CM | POA: Diagnosis not present

## 2018-06-12 DIAGNOSIS — K5900 Constipation, unspecified: Secondary | ICD-10-CM | POA: Diagnosis not present

## 2018-06-12 DIAGNOSIS — Z20828 Contact with and (suspected) exposure to other viral communicable diseases: Secondary | ICD-10-CM | POA: Diagnosis not present

## 2018-06-12 DIAGNOSIS — R05 Cough: Secondary | ICD-10-CM | POA: Diagnosis not present

## 2018-06-12 DIAGNOSIS — R112 Nausea with vomiting, unspecified: Secondary | ICD-10-CM | POA: Diagnosis not present

## 2018-06-12 DIAGNOSIS — I160 Hypertensive urgency: Secondary | ICD-10-CM | POA: Diagnosis not present

## 2018-06-12 DIAGNOSIS — R1013 Epigastric pain: Secondary | ICD-10-CM | POA: Diagnosis not present

## 2018-06-12 DIAGNOSIS — E869 Volume depletion, unspecified: Secondary | ICD-10-CM | POA: Diagnosis not present

## 2018-06-12 LAB — BASIC METABOLIC PANEL
Anion gap: 9 (ref 5–15)
BUN: 30 mg/dL — ABNORMAL HIGH (ref 8–23)
CO2: 25 mmol/L (ref 22–32)
Calcium: 8.3 mg/dL — ABNORMAL LOW (ref 8.9–10.3)
Chloride: 103 mmol/L (ref 98–111)
Creatinine, Ser: 0.99 mg/dL (ref 0.44–1.00)
GFR calc Af Amer: >60 mL/min (ref >60)
GFR calc non Af Amer: 57 mL/min — ABNORMAL LOW (ref >60)
Glucose, Bld: 79 mg/dL (ref 70–99)
Potassium: 3.6 mmol/L (ref 3.5–5.1)
Sodium: 137 mmol/L (ref 135–145)

## 2018-06-12 MED ORDER — DEXAMETHASONE SODIUM PHOSPHATE 4 MG/ML IJ SOLN
4.0000 mg | Freq: Once | INTRAMUSCULAR | Status: AC
  Administered 2018-06-12: 10:00:00 4 mg via INTRAVENOUS
  Filled 2018-06-12: qty 1

## 2018-06-12 MED ORDER — SODIUM CHLORIDE 0.9% FLUSH
3.0000 mL | Freq: Two times a day (BID) | INTRAVENOUS | Status: DC
  Administered 2018-06-12: 09:00:00 3 mL via INTRAVENOUS

## 2018-06-12 MED ORDER — POLYETHYLENE GLYCOL 3350 17 G PO PACK: 17.0000 g | PACK | Freq: Every day | ORAL | 0 refills | Status: DC

## 2018-06-12 MED ORDER — MAGNESIUM SULFATE 2 GM/50ML IV SOLN
2.0000 g | Freq: Once | INTRAVENOUS | Status: AC
  Administered 2018-06-12: 2 g via INTRAVENOUS
  Filled 2018-06-12: qty 50

## 2018-06-12 MED ORDER — SENNOSIDES-DOCUSATE SODIUM 8.6-50 MG PO TABS: 1.0000 | ORAL_TABLET | Freq: Two times a day (BID) | ORAL | 0 refills | Status: DC

## 2018-06-12 NOTE — Progress Notes (Signed)
Patient ID: Alison Price  Patient admitted to the [facility] on 06/10/2018 and discharged from the hospital on 06/12/2018.  Please excuse from work through 06/13/2018.  May return to work 06/14/2018.  Dr Alford Highland [phone]

## 2018-06-12 NOTE — Progress Notes (Signed)
Reports nausea/vomiting resolved; tolerating regular diet. Denies diarrhea/reports she typically has constipation. States ready to go home. Reported to MD on rounds that she had sudden onset of "1 of my migraine HA's"  with decadron IV and magnesium IV given with minimal relief. Fiorcet given with decreased HA; declined further prn's at this time. Phenergan given with pt reporting she takes nausea med regularly with MS contin to prevent nausea. Has chronic pain. Oral and written AVS instructions given with printed work excuse. Ready for discharge home to self care when transportation here.

## 2018-06-12 NOTE — Discharge Instructions (Signed)
Nausea and Vomiting, Adult  Nausea is feeling sick to your stomach or feeling that you are about to throw up (vomit). Vomiting is when food in your stomach is thrown up and out of the mouth. Throwing up can make you feel weak. It can also make you lose too much water in your body (get dehydrated). If you lose too much water in your body, you may:  · Feel tired.  · Feel thirsty.  · Have a dry mouth.  · Have cracked lips.  · Go pee (urinate) less often.  Older adults and people with other diseases or a weak body defense system (immune system) are at higher risk for losing too much water in the body. If you feel sick to your stomach and you throw up, it is important to follow instructions from your doctor about how to take care of yourself.  Follow these instructions at home:  Watch your symptoms for any changes. Tell your doctor about them. Follow these instructions to care for yourself at home.  Eating and drinking         · Take an ORS (oral rehydration solution). This is a drink that is sold at pharmacies and stores.  · Drink clear fluids in small amounts as you are able, such as:  ? Water.  ? Ice chips.  ? Fruit juice that has water added (diluted fruit juice).  ? Low-calorie sports drinks.  · Eat bland, easy-to-digest foods in small amounts as you are able, such as:  ? Bananas.  ? Applesauce.  ? Rice.  ? Low-fat (lean) meats.  ? Toast.  ? Crackers.  · Avoid drinking fluids that have a lot of sugar or caffeine in them. This includes energy drinks, sports drinks, and soda.  · Avoid alcohol.  · Avoid spicy or fatty foods.  General instructions  · Take over-the-counter and prescription medicines only as told by your doctor.  · Drink enough fluid to keep your pee (urine) pale yellow.  · Wash your hands often with soap and water. If you cannot use soap and water, use hand sanitizer.  · Make sure that all people in your home wash their hands well and often.  · Rest at home while you get better.  · Watch your condition  for any changes.  · Take slow and deep breaths when you feel sick to your stomach.  · Keep all follow-up visits as told by your doctor. This is important.  Contact a doctor if:  · Your symptoms get worse.  · You have new symptoms.  · You have a fever.  · You cannot drink fluids without throwing up.  · You feel sick to your stomach for more than 2 days.  · You feel light-headed or dizzy.  · You have a headache.  · You have muscle cramps.  · You have a rash.  · You have pain while peeing.  Get help right away if:  · You have pain in your chest, neck, arm, or jaw.  · You feel very weak or you pass out (faint).  · You throw up again and again.  · You have throw up that is bright red or looks like black coffee grounds.  · You have bloody or black poop (stools) or poop that looks like tar.  · You have a very bad headache, a stiff neck, or both.  · You have very bad pain, cramping, or bloating in your belly (abdomen).  · You have trouble   breathing.  · You are breathing very quickly.  · Your heart is beating very quickly.  · Your skin feels cold and clammy.  · You feel confused.  · You have signs of losing too much water in your body, such as:  ? Dark pee, very little pee, or no pee.  ? Cracked lips.  ? Dry mouth.  ? Sunken eyes.  ? Sleepiness.  ? Weakness.  These symptoms may be an emergency. Do not wait to see if the symptoms will go away. Get medical help right away. Call your local emergency services (911 in the [place] Do not drive yourself to the hospital.  Summary  · Nausea is feeling sick to your stomach or feeling that you are about to throw up (vomit). Vomiting is when food in your stomach is thrown up and out of the mouth.  · Follow instructions from your doctor about eating and drinking to keep from losing too much water in your body.  · Take over-the-counter and prescription medicines only as told by your doctor.  · Contact your doctor if your symptoms get worse or you have new symptoms.  · Keep all follow-up  visits as told by your doctor. This is important.  This information is not intended to replace advice given to you by your health care provider. Make sure you discuss any questions you have with your health care provider.  Document Released: 07/23/2007 Document Revised: 07/14/2017 Document Reviewed: 07/14/2017  Elsevier Interactive Patient Education © 2019 [employer] Inc.

## 2018-06-12 NOTE — Progress Notes (Signed)
Pt transported in transport chair to medical mall for discharge home to self care and to private vehicle.

## 2018-06-12 NOTE — Discharge Summary (Signed)
[facility] - [place] [facility]   PATIENT NAME: Alison Price    MR#:  [id number]  DATE OF BIRTH:  01-Feb-1946  DATE OF ADMISSION:  06/10/2018 ADMITTING PHYSICIAN: Alison Beat, MD  DATE OF DISCHARGE: 06/12/2018  2:20 PM  PRIMARY CARE PHYSICIAN: Alison Shaggy, MD    ADMISSION DIAGNOSIS:  Intractable nausea and vomiting [R11.2]  DISCHARGE DIAGNOSIS:  Active Problems:   Intractable nausea and vomiting   SECONDARY DIAGNOSIS:   Past Medical History:  Diagnosis Date  . Anemia   . Arthritis   . Chronic pain   . GERD (gastroesophageal reflux disease)   . Hypertension   . Osteoporosis   . Stevens-Johnson syndrome (HCC)     HOSPITAL COURSE:   1.  Intractable nausea vomiting.  Patient states that she always has some constipation.  Patient now tolerating diet. Symptoms have improved.  Stable for discharge home.  CT scan of the abdomen showed no acute inflammatory process.  Patient states that she uses mineral oil sometimes to go the bathroom.  Patient states that she will use it at home.  I advised that she stay on MiraLAX on a daily basis. 2.  Headache.  I gave some IV magnesium, Decadron and Fioricet and headache has improved.  Recommend following up with primary care physician for headache prevention medications as outpatient. 3.  Chronic pain on MS Contin and Percocet 4.  Hypokalemia and hypomagnesia which was replaced 5.  Hypertension on Norvasc and lisinopril   DISCHARGE CONDITIONS:   Satisfactory  CONSULTS OBTAINED:  None  DRUG ALLERGIES:   Allergies  Allergen Reactions  . Codeine Nausea And Vomiting  . Sulfa Antibiotics Other (See Comments)    STEVEN JOHNSON SYNDROME    DISCHARGE MEDICATIONS:   Allergies as of 06/12/2018      Reactions   Codeine Nausea And Vomiting   Sulfa Antibiotics Other (See Comments)   STEVEN JOHNSON SYNDROME      Medication List    STOP taking these medications   etodolac 400 MG tablet Commonly known as:   LODINE   ondansetron 4 MG tablet Commonly known as:  ZOFRAN     TAKE these medications   alum & mag hydroxide-simeth 400-400-40 MG/5ML suspension Commonly known as:  MAALOX PLUS Take 30 mLs by mouth every 4 (four) hours as needed.   amLODipine 5 MG tablet Commonly known as:  NORVASC Take 5 mg by mouth daily.   aspirin EC 81 MG tablet Take 81 mg by mouth daily.   butalbital-acetaminophen-caffeine 50-325-40 MG tablet Commonly known as:  FIORICET Take 1 tablet by mouth every 6 (six) hours as needed for headache or migraine.   Combivent Respimat 20-100 MCG/ACT Aers respimat Generic drug:  Ipratropium-Albuterol Inhale 2 puffs into the lungs every 6 (six) hours as needed for wheezing or shortness of breath.   conjugated estrogens vaginal cream Commonly known as:  PREMARIN Place 1 Applicatorful vaginally See admin instructions. Daily on Sunday and Wednesday   dimenhyDRINATE 50 MG tablet Commonly known as:  DRAMAMINE Take 50 mg by mouth every 8 (eight) hours as needed. N/V (usually takes with Morphine and oxycodone)   escitalopram 5 MG tablet Commonly known as:  LEXAPRO Take 5 mg by mouth daily.   Fenofibric Acid 135 MG Cpdr Take 135 mg by mouth daily.   ferrous sulfate 325 (65 FE) MG tablet Take 325 mg by mouth 3 (three) times daily with meals.   fexofenadine 180 MG tablet Commonly known as:  ALLEGRA Take 180 mg by mouth daily.   fluticasone 50 MCG/ACT nasal spray Commonly known as:  FLONASE Place 2 sprays into both nostrils daily.   Fluticasone-Salmeterol 250-50 MCG/DOSE Aepb Commonly known as:  ADVAIR Inhale 1 puff into the lungs 2 (two) times daily.   lisinopril 20 MG tablet Commonly known as:  ZESTRIL Take 20 mg by mouth daily.   meclizine 25 MG tablet Commonly known as:  ANTIVERT Take 25 mg by mouth 2 (two) times daily as needed for dizziness.   morphine 60 MG 12 hr tablet Commonly known as:  MS CONTIN Take 1 tablet (60 mg total) by mouth 3 (three)  times daily.   multivitamin tablet Take 1 tablet by mouth daily.   ondansetron 4 MG disintegrating tablet Commonly known as:  Zofran ODT Take 1 tablet (4 mg total) by mouth every 8 (eight) hours as needed for nausea or vomiting.   oxyCODONE-acetaminophen 5-325 MG tablet Commonly known as:  PERCOCET/ROXICET Take 1 tablet by mouth every 4 (four) hours as needed for moderate pain or severe pain.   pantoprazole 40 MG tablet Commonly known as:  PROTONIX Take 40 mg by mouth daily.   polyethylene glycol 17 g packet Commonly known as:  MIRALAX / GLYCOLAX Take 17 g by mouth daily. Start taking on:  June 13, 2018   senna-docusate 8.6-50 MG tablet Commonly known as:  Senokot-S Take 1 tablet by mouth 2 (two) times daily.   tiZANidine 4 MG tablet Commonly known as:  ZANAFLEX Take 4 mg by mouth every 6 (six) hours as needed for muscle spasms.   venlafaxine 75 MG tablet Commonly known as:  EFFEXOR Take 75 mg by mouth daily.   vitamin C 500 MG tablet Commonly known as:  ASCORBIC ACID Take 500 mg by mouth 3 (three) times daily. anemia- give with ferrous sulfate to potentiate iron absorption   Vitamin D 50 MCG (2000 UT) Caps Take 6,000 Units by mouth daily.        DISCHARGE INSTRUCTIONS:   Follow-up with PMD 5 days  If you experience worsening of your admission symptoms, develop shortness of breath, life threatening emergency, suicidal or homicidal thoughts you must seek medical attention immediately by calling 911 or calling your MD immediately  if symptoms less severe.  You Must read complete instructions/literature along with all the possible adverse reactions/side effects for all the Medicines you take and that have been prescribed to you. Take any new Medicines after you have completely understood and accept all the possible adverse reactions/side effects.   Please note  You were cared for by a hospitalist during your hospital stay. If you have any questions about your  discharge medications or the care you received while you were in the hospital after you are discharged, you can call the unit and asked to speak with the hospitalist on call if the hospitalist that took care of you is not available. Once you are discharged, your primary care physician will handle any further medical issues. Please note that NO REFILLS for any discharge medications will be authorized once you are discharged, as it is imperative that you return to your primary care physician (or establish a relationship with a primary care physician if you do not have one) for your aftercare needs so that they can reassess your need for medications and monitor your lab values.    Today   CHIEF COMPLAINT:   Chief Complaint  Patient presents with  . Vomiting  . Shortness of Breath  .  Diarrhea    HISTORY OF PRESENT ILLNESS:  Alison Price  is a 73 y.o. female came in with nausea vomiting   VITAL SIGNS:  Blood pressure 130/66, pulse 73, temperature 97.9 F (36.6 C), temperature source Oral, resp. rate 20, height  (1.702 m), weight 63.5 kg, SpO2 100 %.   PHYSICAL EXAMINATION:  GENERAL:  73 y.o.-year-old patient lying in the bed with no acute distress.  EYES: Pupils equal, round, reactive to light and accommodation. No scleral icterus. Extraocular muscles intact.  HEENT: Head atraumatic, normocephalic. Oropharynx and nasopharynx clear.  NECK:  Supple, no jugular venous distention. No thyroid enlargement, no tenderness.  LUNGS: Normal breath sounds bilaterally, no wheezing, rales,rhonchi or crepitation. No use of accessory muscles of respiration.  CARDIOVASCULAR: S1, S2 normal. No murmurs, rubs, or gallops.  ABDOMEN: Soft, non-tender, non-distended. Bowel sounds present. No organomegaly or mass.  EXTREMITIES: No pedal edema, cyanosis, or clubbing.  NEUROLOGIC: Cranial nerves II through XII are intact. Muscle strength 5/5 in all extremities. Sensation intact. Gait not checked.   PSYCHIATRIC: The patient is alert and oriented x 3.  SKIN: No obvious rash, lesion, or ulcer.   DATA REVIEW:   CBC Recent Labs  Lab 06/11/18 0552  WBC 7.0  HGB 11.4*  HCT 33.9*  PLT 171    Chemistries  Recent Labs  Lab 06/10/18 0306 06/11/18 0552 06/12/18 0438  NA 138 137 137  K 3.2* 4.1 3.6  CL 100 98 103  CO2 GLUCOSE 166* 106* 79  BUN 11 18 30*  CREATININE 0.63 0.76 0.99  CALCIUM 9.1 8.7* 8.3*  MG 1.5* 2.2  --   AST 27  --   --   ALT 12  --   --   ALKPHOS 92  --   --   BILITOT 0.9  --   --     Cardiac Enzymes Recent Labs  Lab 06/10/18 0306  TROPONINI <0.03    Microbiology Results  Results for orders placed or performed during the hospital encounter of 06/10/18  SARS Coronavirus 2 [facility] order, Performed in [facility] hospital lab)     Status: None   Collection Time: 06/10/18  3:07 AM  Result Value Ref Range Status   SARS Coronavirus 2 NEGATIVE NEGATIVE Final    Comment: (NOTE) If result is NEGATIVE SARS-CoV-2 target nucleic acids are NOT DETECTED. The SARS-CoV-2 RNA is generally detectable in upper and lower  respiratory specimens during the acute phase of infection. The lowest  concentration of SARS-CoV-2 viral copies this assay can detect is 250  copies / mL. A negative result does not preclude SARS-CoV-2 infection  and should not be used as the sole basis for treatment or other  patient management decisions.  A negative result may occur with  improper specimen collection / handling, submission of specimen other  than nasopharyngeal swab, presence of viral mutation(s) within the  areas targeted by this assay, and inadequate number of viral copies  (<250 copies / mL). A negative result must be combined with clinical  observations, patient history, and epidemiological information. If result is POSITIVE SARS-CoV-2 target nucleic acids are DETECTED. The SARS-CoV-2 RNA is generally detectable in upper and lower  respiratory specimens  dur ing the acute phase of infection.  Positive  results are indicative of active infection with SARS-CoV-2.  Clinical  correlation with patient history and other diagnostic information is  necessary to determine patient infection status.  Positive results do  not rule out bacterial  infection or co-infection with other viruses. If result is PRESUMPTIVE POSTIVE SARS-CoV-2 nucleic acids MAY BE PRESENT.   A presumptive positive result was obtained on the submitted specimen  and confirmed on repeat testing.  While 2019 novel coronavirus  (SARS-CoV-2) nucleic acids may be present in the submitted sample  additional confirmatory testing may be necessary for epidemiological  and / or clinical management purposes  to differentiate between  SARS-CoV-2 and other Sarbecovirus currently known to infect humans.  If clinically indicated additional testing with an alternate test  methodology [phone] is advised. The SARS-CoV-2 RNA is generally  detectable in upper and lower respiratory sp ecimens during the acute  phase of infection. The expected result is Negative. Fact Sheet for Patients:  BoilerBrush.com.cyhttps://www.fda.gov/media/136312/download Fact Sheet for Healthcare Providers: https://pope.com/https://www.fda.gov/media/136313/download This test is not yet approved or cleared by the [place] FDA and has been authorized for detection and/or diagnosis of SARS-CoV-2 by FDA under an Emergency Use Authorization (EUA).  This EUA will remain in effect (meaning this test can be used) for the duration of the COVID-19 declaration under Section 564(b)(1) of the Act, 21 U.S.C. section 360bbb-3(b)(1), unless the authorization is terminated or revoked sooner. Performed at [facility], [address] [place], [place] [address]       Management plans discussed with the patient, and she is in agreement.  CODE STATUS:     Code Status Orders  (From admission, onward)         Start     Ordered   06/10/18 0640   Full code  Continuous     06/10/18 0643        Code Status History    Date Active Date Inactive Code Status Order ID Comments User Context   08/05/2017 2245 08/10/2017 1553 Full Code [id number]  Cammy CopaMaier, Angela, MD Inpatient   10/24/2016 0110 10/25/2016 1807 Full Code [id number]  Oralia ManisWillis, David, MD Inpatient      TOTAL TIME TAKING CARE OF THIS PATIENT: 35 minutes.    Alford Highlandichard Evian Derringer M.D on 06/12/2018 at 3:58 PM  Between 7am to 6pm - Pager - [phone]  After 6pm go to [email or web address] - password [email or web address]  [facility] Office  [phone]  CC: Primary care physician; Alison Shaggyate, Denny C, MD

## 2018-09-08 DIAGNOSIS — E785 Hyperlipidemia, unspecified: Secondary | ICD-10-CM | POA: Diagnosis not present

## 2018-09-08 DIAGNOSIS — I10 Essential (primary) hypertension: Secondary | ICD-10-CM | POA: Diagnosis not present

## 2018-09-29 DIAGNOSIS — F0390 Unspecified dementia without behavioral disturbance: Secondary | ICD-10-CM | POA: Diagnosis not present

## 2018-12-01 DIAGNOSIS — F0390 Unspecified dementia without behavioral disturbance: Secondary | ICD-10-CM | POA: Diagnosis not present

## 2018-12-29 DIAGNOSIS — J309 Allergic rhinitis, unspecified: Secondary | ICD-10-CM | POA: Diagnosis not present

## 2019-02-21 DIAGNOSIS — J019 Acute sinusitis, unspecified: Secondary | ICD-10-CM | POA: Diagnosis not present

## 2019-03-28 ENCOUNTER — Ambulatory Visit: Payer: 59 | Attending: Internal Medicine

## 2019-03-28 DIAGNOSIS — Z23 Encounter for immunization: Secondary | ICD-10-CM | POA: Insufficient documentation

## 2019-03-28 NOTE — Progress Notes (Signed)
   Covid-19 Vaccination Clinic  Name:  Alison Price    MRN: [id number] DOB: 1945-10-31  03/28/2019  Alison Price was observed post Covid-19 immunization for 15 minutes without incidence. She was provided with Vaccine Information Sheet and instruction to access the V-Safe system.   Alison Price was instructed to call 911 with any severe reactions post vaccine: Marland Kitchen Difficulty breathing  . Swelling of your face and throat  . A fast heartbeat  . A bad rash all over your body  . Dizziness and weakness    Immunizations Administered    Name Date Dose VIS Date Route   Pfizer COVID-19 Vaccine 03/28/2019  8:26 AM 0.3 mL 01/28/2019 Intramuscular   Manufacturer: [employer], [employer]   Lot: [id number]   NDC: [address]-3

## 2019-04-06 DIAGNOSIS — J029 Acute pharyngitis, unspecified: Secondary | ICD-10-CM | POA: Diagnosis not present

## 2019-04-15 DIAGNOSIS — D649 Anemia, unspecified: Secondary | ICD-10-CM | POA: Diagnosis not present

## 2019-04-15 DIAGNOSIS — I10 Essential (primary) hypertension: Secondary | ICD-10-CM | POA: Diagnosis not present

## 2019-04-15 DIAGNOSIS — E785 Hyperlipidemia, unspecified: Secondary | ICD-10-CM | POA: Diagnosis not present

## 2019-04-15 DIAGNOSIS — J309 Allergic rhinitis, unspecified: Secondary | ICD-10-CM | POA: Diagnosis not present

## 2019-05-27 ENCOUNTER — Emergency Department: Payer: Medicare Other

## 2019-05-27 ENCOUNTER — Emergency Department
Admission: EM | Admit: 2019-05-27 | Discharge: 2019-05-27 | Disposition: A | Discharge status: Home / Self Care | Payer: Medicare Other | Attending: Emergency Medicine | Admitting: Emergency Medicine

## 2019-05-27 ENCOUNTER — Other Ambulatory Visit: Payer: Self-pay

## 2019-05-27 ENCOUNTER — Encounter: Payer: Self-pay | Admitting: Emergency Medicine

## 2019-05-27 DIAGNOSIS — Z79899 Other long term (current) drug therapy: Secondary | ICD-10-CM | POA: Diagnosis not present

## 2019-05-27 DIAGNOSIS — Y999 Unspecified external cause status: Secondary | ICD-10-CM | POA: Diagnosis not present

## 2019-05-27 DIAGNOSIS — Y9389 Activity, other specified: Secondary | ICD-10-CM | POA: Insufficient documentation

## 2019-05-27 DIAGNOSIS — W1812XA Fall from or off toilet with subsequent striking against object, initial encounter: Secondary | ICD-10-CM | POA: Insufficient documentation

## 2019-05-27 DIAGNOSIS — S52501A Unspecified fracture of the lower end of right radius, initial encounter for closed fracture: Secondary | ICD-10-CM

## 2019-05-27 DIAGNOSIS — Z87891 Personal history of nicotine dependence: Secondary | ICD-10-CM | POA: Insufficient documentation

## 2019-05-27 DIAGNOSIS — S59911A Unspecified injury of right forearm, initial encounter: Secondary | ICD-10-CM | POA: Diagnosis present

## 2019-05-27 DIAGNOSIS — I10 Essential (primary) hypertension: Secondary | ICD-10-CM | POA: Diagnosis not present

## 2019-05-27 DIAGNOSIS — Z7982 Long term (current) use of aspirin: Secondary | ICD-10-CM | POA: Insufficient documentation

## 2019-05-27 DIAGNOSIS — Y9289 Other specified places as the place of occurrence of the external cause: Secondary | ICD-10-CM | POA: Insufficient documentation

## 2019-05-27 MED ORDER — MORPHINE SULFATE (PF) 4 MG/ML IV SOLN
4.0000 mg | Freq: Once | INTRAVENOUS | Status: AC
  Administered 2019-05-27: 4 mg via INTRAVENOUS
  Filled 2019-05-27: qty 1

## 2019-05-27 MED ORDER — FENTANYL CITRATE (PF) 100 MCG/2ML IJ SOLN
50.0000 ug | Freq: Once | INTRAMUSCULAR | Status: AC
  Administered 2019-05-27: 20:00:00 50 ug via INTRAVENOUS
  Filled 2019-05-27: qty 2

## 2019-05-27 MED ORDER — ONDANSETRON HCL 4 MG/2ML IJ SOLN
4.0000 mg | Freq: Once | INTRAMUSCULAR | Status: AC
  Administered 2019-05-27: 20:00:00 4 mg via INTRAVENOUS
  Filled 2019-05-27: qty 2

## 2019-05-27 NOTE — ED Notes (Signed)
Peripheral IV discontinued. Catheter intact. No signs of infiltration or redness. Gauze applied to IV site.   Discharge instructions reviewed with patient. Questions fielded by this RN. Patient verbalizes understanding of instructions. Patient discharged home in stable condition per caribeth, RN. No acute distress noted at time of discharge.   Pt dressed in blue paper scrub pants, and toileted  Room searched for belongings and pt wheeled to family car in parking lot  Paper DC computer in room inop

## 2019-05-27 NOTE — ED Notes (Signed)
1 ring removed from right 1st finger and given to pt in specimen cup, two rings on third finger unable to remove, right wrist elevated and ice applied

## 2019-05-27 NOTE — ED Triage Notes (Signed)
Pt in via EMS from home with c/o fall. EMS reports she fell last pm according to family. Denies LOC, denies head, neck and back pain. Pt c/o right FA pain with obvious deformity and swelling. administered and 4mg  zofran administered via EMS. 150/90, 85HR, 89%RA, FSBS 180. #20g left FA.

## 2019-05-27 NOTE — ED Notes (Signed)
Pt still c/o 10/10 pain to right upper extremities, new orders acknowledged. See [facility]

## 2019-05-27 NOTE — ED Notes (Signed)
Sugartong OCL applied with assistance of Kadijah,NT.  Triplett,NP approved.

## 2019-05-27 NOTE — ED Triage Notes (Signed)
Pt reports went to the [facility] this am and got ready to sit down and use the toilet and she fell. Pt states unsure what she hit her arm on but reports it has been painful all day. Swelling noted.

## 2019-05-29 NOTE — ED Provider Notes (Signed)
[facility] Emergency Department Provider Note ____________________________________________  Time seen: Approximately 12:47 PM  I have reviewed the triage vital signs and the nursing notes.   HISTORY  Chief Complaint Arm Injury and Fall    HPI Alison Price is a 74 y.o. female who presents to the emergency department for evaluation and treatment of right wrist pain after a nonsyncopal fall prior to arrival.  She had gone to the bathroom and was going to sit on the toilet and fell.  She is unsure what she hit her arm on.  Injury occurred last night.  No alleviating measures prior to arrival.   Past Medical History:  Diagnosis Date  . Anemia   . Arthritis   . Chronic pain   . GERD (gastroesophageal reflux disease)   . Hypertension   . Osteoporosis   . Stevens-Johnson syndrome [facility])     Patient Active Problem List   Diagnosis Date Noted  . Hip fx (HCC) 08/05/2017  . Intractable nausea and vomiting 10/23/2016  . Hypokalemia 10/23/2016  . Dehydration 10/23/2016  . HTN (hypertension) 10/23/2016  . History of total abdominal hysterectomy 06/26/2015  . Pelvic pressure in female 06/26/2015  . Vaginal atrophy 06/26/2015  . Hematuria 06/26/2015  . Dysuria 06/26/2015  . Pancytopenia (HCC) 04/13/2015    Past Surgical History:  Procedure Laterality Date  . ABDOMINAL HYSTERECTOMY     partial  . BACK SURGERY    . FOOT SURGERY    . HAND SURGERY     THUMB REPAIR  . HEMORRHOID SURGERY    . INTRAMEDULLARY (IM) NAIL INTERTROCHANTERIC Left 08/06/2017   Procedure: INTRAMEDULLARY (IM) NAIL INTERTROCHANTRIC;  Surgeon: Signa Kell, MD;  Location: [facility] ORS;  Service: Orthopedics;  Laterality: Left;  . POSTERIOR REPAIR      Prior to Admission medications   Medication Sig Start Date End Date Taking? Authorizing Provider  alum & mag hydroxide-simeth (MAALOX PLUS) 400-400-40 MG/5ML suspension Take 30 mLs by mouth every 4 (four) hours as needed.    [provider]  amLODipine (NORVASC) 5 MG tablet Take 5 mg by mouth daily. 03/08/18   [provider]  aspirin EC 81 MG tablet Take 81 mg by mouth daily.    [provider]  butalbital-acetaminophen-caffeine (FIORICET, ESGIC) 50-325-40 MG tablet Take 1 tablet by mouth every 6 (six) hours as needed for headache or migraine.    [provider]  Cholecalciferol (VITAMIN D) 2000 units CAPS Take 6,000 Units by mouth daily.     [provider]  Choline Fenofibrate (FENOFIBRIC ACID) 135 MG CPDR Take 135 mg by mouth daily.     [provider]  conjugated estrogens (PREMARIN) vaginal cream Place 1 Applicatorful vaginally See admin instructions. Daily on Sunday and Wednesday    [provider]  dimenhyDRINATE (DRAMAMINE) 50 MG tablet Take 50 mg by mouth every 8 (eight) hours as needed. N/V (usually takes with Morphine and oxycodone)    [provider]  escitalopram (LEXAPRO) 5 MG tablet Take 5 mg by mouth daily. 03/08/18   [provider]  ferrous sulfate 325 (65 FE) MG tablet Take 325 mg by mouth 3 (three) times daily with meals.    [provider]  fexofenadine (ALLEGRA) 180 MG tablet Take 180 mg by mouth daily.    [provider]  fluticasone (FLONASE) 50 MCG/ACT nasal spray Place 2 sprays into both nostrils daily.    [provider]  Fluticasone-Salmeterol (ADVAIR) 250-50 MCG/DOSE AEPB Inhale 1 puff  into the lungs 2 (two) times daily.    [provider]  Ipratropium-Albuterol (COMBIVENT RESPIMAT) 20-100 MCG/ACT AERS respimat Inhale 2 puffs into the lungs every 6 (six) hours as needed for wheezing or shortness of breath.    [provider]  lisinopril (ZESTRIL) 20 MG tablet Take 20 mg by mouth daily. 03/08/18   [provider]  meclizine (ANTIVERT) 25 MG tablet Take 25 mg by mouth 2 (two) times daily as needed for dizziness.    [provider]  morphine (MS CONTIN) 60 MG 12 hr  tablet Take 1 tablet (60 mg total) by mouth 3 (three) times daily. 08/19/17   Gerlene Fee, NP  Multiple Vitamin (MULTIVITAMIN) tablet Take 1 tablet by mouth daily.    [provider]  ondansetron (ZOFRAN ODT) 4 MG disintegrating tablet Take 1 tablet (4 mg total) by mouth every 8 (eight) hours as needed for nausea or vomiting. 03/27/18   Earleen Newport, MD  oxyCODONE-acetaminophen (PERCOCET/ROXICET) 5-325 MG tablet Take 1 tablet by mouth every 4 (four) hours as needed for moderate pain or severe pain.    [provider]  pantoprazole (PROTONIX) 40 MG tablet Take 40 mg by mouth daily.     [provider]  polyethylene glycol (MIRALAX / GLYCOLAX) 17 g packet Take 17 g by mouth daily. 06/13/18   Loletha Grayer, MD  senna-docusate (SENOKOT-S) 8.6-50 MG tablet Take 1 tablet by mouth 2 (two) times daily. 06/12/18   Loletha Grayer, MD  tiZANidine (ZANAFLEX) 4 MG tablet Take 4 mg by mouth every 6 (six) hours as needed for muscle spasms.  01/17/15   [provider]  venlafaxine (EFFEXOR) 75 MG tablet Take 75 mg by mouth daily.    [provider]  vitamin C (ASCORBIC ACID) 500 MG tablet Take 500 mg by mouth 3 (three) times daily. anemia- give with ferrous sulfate to potentiate iron absorption    [provider]    Allergies Codeine and Sulfa antibiotics  Family History  Problem Relation Age of Onset  . Diabetes Paternal Grandmother   . Uterine cancer Maternal Aunt   . Stomach cancer Maternal Aunt   . Breast cancer Neg Hx   . Ovarian cancer Neg Hx   . Colon cancer Neg Hx   . Heart disease Neg Hx     Social History Social History   Tobacco Use  . Smoking status: Former Smoker    Quit date: 02/27/1986    Years since quitting: 33.2  . Smokeless tobacco: Never Used  Substance Use Topics  . Alcohol use: No  . Drug use: No    Review of Systems Constitutional: Negative for fever. Cardiovascular: Negative for chest pain. Respiratory:  Negative for shortness of breath. Musculoskeletal: Positive for right wrist pain.  Positive for right hand pain. Skin: Positive for erythema and early ecchymosis of the right hand and wrist. Neurological: Negative for decrease in sensation  ____________________________________________   PHYSICAL EXAM:  VITAL SIGNS: ED Triage Vitals  Enc Vitals Group     BP 05/27/19 1828 (!) 166/81     Pulse Rate 05/27/19 1828 83     Resp 05/27/19 1828 16     Temp 05/27/19 1828 99 F (37.2 C)     Temp Source 05/27/19 1828 Oral     SpO2 05/27/19 1828 100 %     Weight 05/27/19 1829 140 lb (63.5 kg)     Height 05/27/19 1829 5\' 6"  (1.676 m)     Head  Circumference --      Peak Flow --      Pain Score 05/27/19 2001 10     Pain Loc --      Pain Edu? --      Excl. in GC? --     Constitutional: Alert and oriented. Well appearing and in no acute distress. Eyes: Conjunctivae are clear without discharge or drainage Head: Atraumatic Neck: Supple.  No focal midline tenderness. Respiratory: No cough. Respirations are even and unlabored. Musculoskeletal: Obvious deformity of the right wrist.  Two rings on right fourth digit. PIP edematous preventing removal of rings. Neurologic: Motor and sensory intact.   Skin: No open wounds over right arm.  Psychiatric: Affect and behavior are appropriate.  ____________________________________________   LABS (all labs ordered are listed, but only abnormal results are displayed)  Labs Reviewed - No data to display ____________________________________________  RADIOLOGY  Nondisplaced, impacted distal radius fracture.  I, Kem Boroughs, personally viewed and evaluated these images (plain radiographs) as part of my medical decision making, as well as reviewing the written report by the radiologist.  No results found. ____________________________________________   PROCEDURES  .Ortho Injury Treatment  Date/Time: 05/29/2019 12:55 PM Performed by: Chinita Pester, FNP Authorized by: Chinita Pester, FNP   Consent:    Consent obtained:  Verbal   Consent given by:  Patient   Risks discussed:  FractureInjury location: wrist Location details: right wrist Injury type: fracture Fracture type: distal radius Pre-procedure neurovascular assessment: neurovascularly intact Manipulation performed: no Immobilization: splint and sling Splint type: sugar tong Supplies used: cotton padding,  elastic bandage and Ortho-Glass Post-procedure neurovascular assessment: post-procedure neurovascularly intact     ____________________________________________   INITIAL IMPRESSION / ASSESSMENT AND PLAN / ED COURSE  HAYZLEE MCSORLEY is a 74 y.o. who presents to the emergency department for treatment and evaluation of right wrist pain after non-syncopal fall. Imaging shows non displaced distal radius fracture.  Fentanyl ordered for pain without much relief. Morphine and zofran given with significant relief. Sugartong applied by tech. She will call and schedule an appointment with orthopedics. She states that she has pain medication at home. She was advised to take that as prescribed. She is to return to the ER for concerns if unable to schedule an appointment with primary care or orthopedics.  Medications  fentaNYL (SUBLIMAZE) injection 50 mcg (50 mcg Intravenous Given 05/27/19 2002)  morphine 4 MG/ML injection 4 mg (4 mg Intravenous Given 05/27/19 2020)  ondansetron (ZOFRAN) injection 4 mg (4 mg Intravenous Given 05/27/19 2020)    Pertinent labs & imaging results that were available during my care of the patient were reviewed by me and considered in my medical decision making (see chart for details).   _________________________________________   FINAL CLINICAL IMPRESSION(S) / ED DIAGNOSES  Final diagnoses:  Closed fracture of distal end of right radius, unspecified fracture morphology, initial encounter    ED Discharge Orders    None       If  controlled substance prescribed during this visit, 12 month history viewed on the [facility] prior to issuing an initial prescription for Schedule II or III opiod.   Chinita Pester, FNP 05/29/19 1301    Sharyn Creamer, MD 05/30/19 2137

## 2019-06-29 ENCOUNTER — Other Ambulatory Visit: Payer: Self-pay

## 2019-06-29 ENCOUNTER — Emergency Department: Payer: Medicare Other

## 2019-06-29 ENCOUNTER — Inpatient Hospital Stay
Admission: EM | Admit: 2019-06-29 | Discharge: 2019-07-02 | DRG: 391 | Disposition: A | Discharge status: Home Health | Payer: Medicare Other | Attending: Internal Medicine | Admitting: Internal Medicine

## 2019-06-29 ENCOUNTER — Encounter: Payer: Self-pay | Admitting: Emergency Medicine

## 2019-06-29 DIAGNOSIS — B9629 Other Escherichia coli [E. coli] as the cause of diseases classified elsewhere: Secondary | ICD-10-CM

## 2019-06-29 DIAGNOSIS — G894 Chronic pain syndrome: Secondary | ICD-10-CM | POA: Diagnosis present

## 2019-06-29 DIAGNOSIS — K529 Noninfective gastroenteritis and colitis, unspecified: Secondary | ICD-10-CM | POA: Diagnosis present

## 2019-06-29 DIAGNOSIS — Z7982 Long term (current) use of aspirin: Secondary | ICD-10-CM | POA: Diagnosis not present

## 2019-06-29 DIAGNOSIS — E43 Unspecified severe protein-calorie malnutrition: Secondary | ICD-10-CM | POA: Insufficient documentation

## 2019-06-29 DIAGNOSIS — E860 Dehydration: Secondary | ICD-10-CM

## 2019-06-29 DIAGNOSIS — K5909 Other constipation: Secondary | ICD-10-CM | POA: Diagnosis present

## 2019-06-29 DIAGNOSIS — Z87891 Personal history of nicotine dependence: Secondary | ICD-10-CM

## 2019-06-29 DIAGNOSIS — Z882 Allergy status to sulfonamides status: Secondary | ICD-10-CM

## 2019-06-29 DIAGNOSIS — K5903 Drug induced constipation: Secondary | ICD-10-CM | POA: Diagnosis not present

## 2019-06-29 DIAGNOSIS — K2970 Gastritis, unspecified, without bleeding: Secondary | ICD-10-CM | POA: Diagnosis not present

## 2019-06-29 DIAGNOSIS — Z20822 Contact with and (suspected) exposure to covid-19: Secondary | ICD-10-CM | POA: Diagnosis present

## 2019-06-29 DIAGNOSIS — G8929 Other chronic pain: Secondary | ICD-10-CM

## 2019-06-29 DIAGNOSIS — Z79899 Other long term (current) drug therapy: Secondary | ICD-10-CM

## 2019-06-29 DIAGNOSIS — K219 Gastro-esophageal reflux disease without esophagitis: Secondary | ICD-10-CM | POA: Insufficient documentation

## 2019-06-29 DIAGNOSIS — R109 Unspecified abdominal pain: Secondary | ICD-10-CM | POA: Diagnosis present

## 2019-06-29 DIAGNOSIS — M1990 Unspecified osteoarthritis, unspecified site: Secondary | ICD-10-CM | POA: Diagnosis present

## 2019-06-29 DIAGNOSIS — N390 Urinary tract infection, site not specified: Secondary | ICD-10-CM

## 2019-06-29 DIAGNOSIS — M810 Age-related osteoporosis without current pathological fracture: Secondary | ICD-10-CM | POA: Diagnosis present

## 2019-06-29 DIAGNOSIS — R7301 Impaired fasting glucose: Secondary | ICD-10-CM | POA: Diagnosis present

## 2019-06-29 DIAGNOSIS — Z885 Allergy status to narcotic agent status: Secondary | ICD-10-CM

## 2019-06-29 DIAGNOSIS — I10 Essential (primary) hypertension: Secondary | ICD-10-CM | POA: Diagnosis present

## 2019-06-29 DIAGNOSIS — Z833 Family history of diabetes mellitus: Secondary | ICD-10-CM

## 2019-06-29 DIAGNOSIS — T402X5A Adverse effect of other opioids, initial encounter: Secondary | ICD-10-CM | POA: Diagnosis not present

## 2019-06-29 DIAGNOSIS — R112 Nausea with vomiting, unspecified: Secondary | ICD-10-CM | POA: Diagnosis not present

## 2019-06-29 DIAGNOSIS — R1084 Generalized abdominal pain: Secondary | ICD-10-CM | POA: Diagnosis not present

## 2019-06-29 DIAGNOSIS — N3000 Acute cystitis without hematuria: Secondary | ICD-10-CM | POA: Diagnosis present

## 2019-06-29 DIAGNOSIS — K259 Gastric ulcer, unspecified as acute or chronic, without hemorrhage or perforation: Secondary | ICD-10-CM | POA: Diagnosis present

## 2019-06-29 DIAGNOSIS — Z1612 Extended spectrum beta lactamase (ESBL) resistance: Secondary | ICD-10-CM | POA: Diagnosis present

## 2019-06-29 DIAGNOSIS — K5900 Constipation, unspecified: Secondary | ICD-10-CM

## 2019-06-29 DIAGNOSIS — R531 Weakness: Secondary | ICD-10-CM | POA: Diagnosis not present

## 2019-06-29 DIAGNOSIS — B9620 Unspecified Escherichia coli [E. coli] as the cause of diseases classified elsewhere: Secondary | ICD-10-CM | POA: Diagnosis present

## 2019-06-29 DIAGNOSIS — Z79891 Long term (current) use of opiate analgesic: Secondary | ICD-10-CM | POA: Diagnosis not present

## 2019-06-29 DIAGNOSIS — Z6821 Body mass index (BMI) 21.0-21.9, adult: Secondary | ICD-10-CM | POA: Diagnosis not present

## 2019-06-29 DIAGNOSIS — E876 Hypokalemia: Secondary | ICD-10-CM | POA: Diagnosis present

## 2019-06-29 DIAGNOSIS — Z800 Family history of malignant neoplasm of digestive organs: Secondary | ICD-10-CM

## 2019-06-29 HISTORY — DX: Urinary tract infection, site not specified: N39.0

## 2019-06-29 HISTORY — DX: Urinary tract infection, site not specified: B96.29

## 2019-06-29 LAB — URINALYSIS, COMPLETE (UACMP) WITH MICROSCOPIC
Bilirubin Urine: NEGATIVE
Glucose, UA: NEGATIVE mg/dL
Ketones, ur: 5 mg/dL — AB
Leukocytes,Ua: NEGATIVE
Nitrite: NEGATIVE
Protein, ur: >=300 mg/dL — AB
Specific Gravity, Urine: 1.025 (ref 1.005–1.030)
pH: 7 (ref 5.0–8.0)

## 2019-06-29 LAB — HEMOGLOBIN A1C
Hgb A1c MFr Bld: 5 % (ref 4.8–5.6)
Mean Plasma Glucose: 96.8 mg/dL

## 2019-06-29 LAB — LIPASE, BLOOD: Lipase: 78 U/L — ABNORMAL HIGH (ref 11–51)

## 2019-06-29 LAB — MAGNESIUM: Magnesium: 1.6 mg/dL — ABNORMAL LOW (ref 1.7–2.4)

## 2019-06-29 LAB — CBC
HCT: 37.9 % (ref 36.0–46.0)
Hemoglobin: 13.2 g/dL (ref 12.0–15.0)
MCH: 30.6 pg (ref 26.0–34.0)
MCHC: 34.8 g/dL (ref 30.0–36.0)
MCV: 87.9 fL (ref 80.0–100.0)
Platelets: 203 10*3/uL (ref 150–400)
RBC: 4.31 MIL/uL (ref 3.87–5.11)
RDW: 14.1 % (ref 11.5–15.5)
WBC: 12.8 10*3/uL — ABNORMAL HIGH (ref 4.0–10.5)
nRBC: 0 % (ref 0.0–0.2)

## 2019-06-29 LAB — LACTIC ACID, PLASMA
Lactic Acid, Venous: 1.5 mmol/L (ref 0.5–1.9)
Lactic Acid, Venous: 1.1 mmol/L (ref 0.5–1.9)

## 2019-06-29 LAB — TROPONIN I (HIGH SENSITIVITY)
Troponin I (High Sensitivity): 17 ng/L (ref <18)
Troponin I (High Sensitivity): 22 ng/L — ABNORMAL HIGH (ref <18)

## 2019-06-29 LAB — COMPREHENSIVE METABOLIC PANEL
ALT: 10 U/L (ref 0–44)
AST: 28 U/L (ref 15–41)
Albumin: 4.9 g/dL (ref 3.5–5.0)
Alkaline Phosphatase: 91 U/L (ref 38–126)
Anion gap: 16 — ABNORMAL HIGH (ref 5–15)
BUN: 20 mg/dL (ref 8–23)
CO2: 27 mmol/L (ref 22–32)
Calcium: 10 mg/dL (ref 8.9–10.3)
Chloride: 96 mmol/L — ABNORMAL LOW (ref 98–111)
Creatinine, Ser: 0.55 mg/dL (ref 0.44–1.00)
GFR calc Af Amer: >60 mL/min (ref >60)
GFR calc non Af Amer: >60 mL/min (ref >60)
Glucose, Bld: 143 mg/dL — ABNORMAL HIGH (ref 70–99)
Potassium: 2.6 mmol/L — CL (ref 3.5–5.1)
Sodium: 139 mmol/L (ref 135–145)
Total Bilirubin: 1.3 mg/dL — ABNORMAL HIGH (ref 0.3–1.2)
Total Protein: 8.9 g/dL — ABNORMAL HIGH (ref 6.5–8.1)

## 2019-06-29 LAB — SARS CORONAVIRUS 2 BY RT PCR (HOSPITAL ORDER, PERFORMED IN ~~LOC~~ HOSPITAL LAB): SARS Coronavirus 2: NEGATIVE

## 2019-06-29 MED ORDER — TIZANIDINE HCL 4 MG PO TABS
4.0000 mg | ORAL_TABLET | Freq: Four times a day (QID) | ORAL | Status: DC | PRN
  Filled 2019-06-29: qty 1

## 2019-06-29 MED ORDER — POTASSIUM CHLORIDE 10 MEQ/100ML IV SOLN
10.0000 meq | INTRAVENOUS | Status: AC
  Administered 2019-06-29 (×2): 10 meq via INTRAVENOUS
  Filled 2019-06-29 (×3): qty 100

## 2019-06-29 MED ORDER — MOMETASONE FURO-FORMOTEROL FUM 200-5 MCG/ACT IN AERO
2.0000 | INHALATION_SPRAY | Freq: Two times a day (BID) | RESPIRATORY_TRACT | Status: DC
  Administered 2019-06-30 – 2019-07-02 (×5): 2 via RESPIRATORY_TRACT
  Filled 2019-06-29: qty 8.8

## 2019-06-29 MED ORDER — LABETALOL HCL 5 MG/ML IV SOLN
5.0000 mg | INTRAVENOUS | Status: DC | PRN
  Administered 2019-06-30 (×4): 5 mg via INTRAVENOUS
  Filled 2019-06-29 (×3): qty 4

## 2019-06-29 MED ORDER — MECLIZINE HCL 25 MG PO TABS
25.0000 mg | ORAL_TABLET | Freq: Two times a day (BID) | ORAL | Status: DC | PRN
  Filled 2019-06-29: qty 1

## 2019-06-29 MED ORDER — METHYLNALTREXONE BROMIDE 12 MG/0.6ML ~~LOC~~ SOLN
4.0000 mg | Freq: Once | SUBCUTANEOUS | Status: DC
  Filled 2019-06-29: qty 0.6

## 2019-06-29 MED ORDER — METHYLNALTREXONE BROMIDE 12 MG/0.6ML ~~LOC~~ SOLN: 12.0000 mg | Freq: Once | SUBCUTANEOUS | Status: DC

## 2019-06-29 MED ORDER — HYDROMORPHONE HCL 1 MG/ML IJ SOLN
0.5000 mg | Freq: Once | INTRAMUSCULAR | Status: AC
  Administered 2019-06-29: 0.5 mg via INTRAVENOUS
  Filled 2019-06-29: qty 1

## 2019-06-29 MED ORDER — DIMENHYDRINATE 50 MG PO TABS
50.0000 mg | ORAL_TABLET | Freq: Three times a day (TID) | ORAL | Status: DC | PRN
  Filled 2019-06-29 (×2): qty 1

## 2019-06-29 MED ORDER — MORPHINE SULFATE (PF) 2 MG/ML IV SOLN
2.0000 mg | INTRAVENOUS | Status: DC | PRN
  Administered 2019-06-29 – 2019-06-30 (×2): 2 mg via INTRAVENOUS
  Filled 2019-06-29 (×2): qty 1

## 2019-06-29 MED ORDER — ESCITALOPRAM OXALATE 10 MG PO TABS
5.0000 mg | ORAL_TABLET | Freq: Every day | ORAL | Status: DC
  Administered 2019-06-29 – 2019-07-02 (×3): 5 mg via ORAL
  Filled 2019-06-29: qty 0.5
  Filled 2019-06-29: qty 1
  Filled 2019-06-29 (×2): qty 0.5

## 2019-06-29 MED ORDER — VENLAFAXINE HCL 37.5 MG PO TABS
75.0000 mg | ORAL_TABLET | Freq: Every day | ORAL | Status: DC
  Administered 2019-06-29 – 2019-07-02 (×3): 75 mg via ORAL
  Filled 2019-06-29 (×4): qty 2

## 2019-06-29 MED ORDER — PIPERACILLIN-TAZOBACTAM 3.375 G IVPB
3.3750 g | Freq: Three times a day (TID) | INTRAVENOUS | Status: DC
  Administered 2019-06-29 – 2019-07-01 (×5): 3.375 g via INTRAVENOUS
  Filled 2019-06-29 (×8): qty 50

## 2019-06-29 MED ORDER — IPRATROPIUM-ALBUTEROL 0.5-2.5 (3) MG/3ML IN SOLN: 3.0000 mL | Freq: Four times a day (QID) | RESPIRATORY_TRACT | Status: DC | PRN

## 2019-06-29 MED ORDER — IOHEXOL 300 MG/ML  SOLN
100.0000 mL | Freq: Once | INTRAMUSCULAR | Status: AC | PRN
  Administered 2019-06-29: 11:00:00 100 mL via INTRAVENOUS

## 2019-06-29 MED ORDER — ONDANSETRON HCL 4 MG PO TABS
4.0000 mg | ORAL_TABLET | Freq: Four times a day (QID) | ORAL | Status: DC | PRN
  Administered 2019-07-01: 22:00:00 4 mg via ORAL
  Filled 2019-06-29: qty 1

## 2019-06-29 MED ORDER — HYDRALAZINE HCL 20 MG/ML IJ SOLN
5.0000 mg | Freq: Once | INTRAMUSCULAR | Status: AC
  Administered 2019-06-29: 13:00:00 5 mg via INTRAVENOUS
  Filled 2019-06-29: qty 1

## 2019-06-29 MED ORDER — MORPHINE SULFATE (PF) 4 MG/ML IV SOLN
4.0000 mg | Freq: Once | INTRAVENOUS | Status: AC
  Administered 2019-06-29: 4 mg via INTRAVENOUS
  Filled 2019-06-29: qty 1

## 2019-06-29 MED ORDER — OXYCODONE-ACETAMINOPHEN 5-325 MG PO TABS: 1.0000 | ORAL_TABLET | ORAL | Status: DC | PRN

## 2019-06-29 MED ORDER — PANTOPRAZOLE SODIUM 40 MG IV SOLR
40.0000 mg | Freq: Two times a day (BID) | INTRAVENOUS | Status: DC
  Administered 2019-06-29 – 2019-07-02 (×6): 40 mg via INTRAVENOUS
  Filled 2019-06-29 (×6): qty 40

## 2019-06-29 MED ORDER — PIPERACILLIN-TAZOBACTAM 3.375 G IVPB
3.3750 g | Freq: Once | INTRAVENOUS | Status: AC
  Administered 2019-06-29: 3.375 g via INTRAVENOUS
  Filled 2019-06-29: qty 50

## 2019-06-29 MED ORDER — POTASSIUM CHLORIDE IN NACL 20-0.9 MEQ/L-% IV SOLN
INTRAVENOUS | Status: DC
  Filled 2019-06-29 (×6): qty 1000

## 2019-06-29 MED ORDER — LISINOPRIL 20 MG PO TABS
20.0000 mg | ORAL_TABLET | Freq: Every day | ORAL | Status: DC
  Administered 2019-06-29 – 2019-06-30 (×2): 20 mg via ORAL
  Filled 2019-06-29: qty 1
  Filled 2019-06-29: qty 2

## 2019-06-29 MED ORDER — BUTALBITAL-APAP-CAFFEINE 50-325-40 MG PO TABS
1.0000 | ORAL_TABLET | Freq: Four times a day (QID) | ORAL | Status: DC | PRN
  Administered 2019-07-01: 1 via ORAL
  Filled 2019-06-29: qty 1

## 2019-06-29 MED ORDER — ACETAMINOPHEN 650 MG RE SUPP: 650.0000 mg | Freq: Four times a day (QID) | RECTAL | Status: DC | PRN

## 2019-06-29 MED ORDER — PANTOPRAZOLE SODIUM 40 MG IV SOLR
40.0000 mg | Freq: Once | INTRAVENOUS | Status: AC
  Administered 2019-06-29: 13:00:00 40 mg via INTRAVENOUS
  Filled 2019-06-29: qty 40

## 2019-06-29 MED ORDER — MORPHINE SULFATE ER 60 MG PO TBCR: 60.0000 mg | EXTENDED_RELEASE_TABLET | Freq: Three times a day (TID) | ORAL | Status: DC

## 2019-06-29 MED ORDER — POLYETHYLENE GLYCOL 3350 17 G PO PACK: 17.0000 g | PACK | Freq: Every day | ORAL | Status: DC

## 2019-06-29 MED ORDER — ONDANSETRON HCL 4 MG/2ML IJ SOLN
4.0000 mg | Freq: Four times a day (QID) | INTRAMUSCULAR | Status: DC | PRN
  Administered 2019-06-29 – 2019-06-30 (×2): 4 mg via INTRAVENOUS
  Filled 2019-06-29 (×2): qty 2

## 2019-06-29 MED ORDER — POTASSIUM CHLORIDE CRYS ER 20 MEQ PO TBCR
40.0000 meq | EXTENDED_RELEASE_TABLET | Freq: Once | ORAL | Status: AC
  Administered 2019-06-29: 40 meq via ORAL
  Filled 2019-06-29: qty 2

## 2019-06-29 MED ORDER — MORPHINE SULFATE (PF) 2 MG/ML IV SOLN
2.0000 mg | Freq: Once | INTRAVENOUS | Status: AC
  Administered 2019-06-29: 12:00:00 2 mg via INTRAVENOUS
  Filled 2019-06-29: qty 1

## 2019-06-29 MED ORDER — POTASSIUM CHLORIDE 10 MEQ/100ML IV SOLN
10.0000 meq | Freq: Once | INTRAVENOUS | Status: AC
  Administered 2019-06-29: 12:00:00 10 meq via INTRAVENOUS
  Filled 2019-06-29: qty 100

## 2019-06-29 MED ORDER — SENNOSIDES-DOCUSATE SODIUM 8.6-50 MG PO TABS: 1.0000 | ORAL_TABLET | Freq: Two times a day (BID) | ORAL | Status: DC

## 2019-06-29 MED ORDER — ACETAMINOPHEN 325 MG PO TABS
650.0000 mg | ORAL_TABLET | Freq: Four times a day (QID) | ORAL | Status: DC | PRN
  Administered 2019-06-29 – 2019-07-01 (×2): 650 mg via ORAL
  Filled 2019-06-29 (×2): qty 2

## 2019-06-29 MED ORDER — PROMETHAZINE HCL 25 MG/ML IJ SOLN
12.5000 mg | Freq: Four times a day (QID) | INTRAMUSCULAR | Status: DC | PRN
  Administered 2019-06-29 – 2019-06-30 (×2): 12.5 mg via INTRAVENOUS
  Filled 2019-06-29 (×2): qty 1

## 2019-06-29 MED ORDER — FLUTICASONE PROPIONATE 50 MCG/ACT NA SUSP
2.0000 | Freq: Every day | NASAL | Status: DC
  Administered 2019-06-30 – 2019-07-02 (×3): 2 via NASAL
  Filled 2019-06-29: qty 16

## 2019-06-29 MED ORDER — MAGNESIUM SULFATE 2 GM/50ML IV SOLN
2.0000 g | Freq: Once | INTRAVENOUS | Status: AC
  Administered 2019-06-29: 2 g via INTRAVENOUS
  Filled 2019-06-29: qty 50

## 2019-06-29 MED ORDER — SODIUM CHLORIDE 0.9 % IV BOLUS
1000.0000 mL | Freq: Once | INTRAVENOUS | Status: AC
  Administered 2019-06-29: 11:00:00 1000 mL via INTRAVENOUS

## 2019-06-29 MED ORDER — ONDANSETRON HCL 4 MG/2ML IJ SOLN
4.0000 mg | Freq: Once | INTRAMUSCULAR | Status: AC
  Administered 2019-06-29: 4 mg via INTRAVENOUS
  Filled 2019-06-29: qty 2

## 2019-06-29 MED ORDER — AMLODIPINE BESYLATE 5 MG PO TABS
5.0000 mg | ORAL_TABLET | Freq: Every day | ORAL | Status: DC
  Administered 2019-06-29 – 2019-06-30 (×2): 5 mg via ORAL
  Filled 2019-06-29 (×2): qty 1

## 2019-06-29 MED ORDER — LORATADINE 10 MG PO TABS
10.0000 mg | ORAL_TABLET | Freq: Every day | ORAL | Status: DC
  Administered 2019-07-01 – 2019-07-02 (×2): 10 mg via ORAL
  Filled 2019-06-29 (×2): qty 1

## 2019-06-29 NOTE — ED Notes (Signed)
Date and time results received: 06/29/19 11:00  Test: K+ Critical Value: 2.6  Name of Provider Notified: Dr. Erma Heritage  Orders Received? Or Actions Taken?: Orders Received - See Orders for details

## 2019-06-29 NOTE — Progress Notes (Signed)
PHARMACY -  BRIEF ANTIBIOTIC NOTE   Pharmacy has received consult(s) for Zosyn from an ED provider.  The patient's profile has been reviewed for ht/wt/allergies/indication/available labs.    One time order(s) placed for Zosyn 3.375g IV x 1 dose.  Further antibiotics/pharmacy consults should be ordered by admitting physician if indicated.                       Thank you, Bettey Costa 06/29/2019  12:17 PM

## 2019-06-29 NOTE — Consult Note (Signed)
GI Inpatient Consult Note  Reason for Consult: Nausea and vomiting, epigastric abd pain    Attending Requesting Consult: Dr. Alford Highland, MD  History of Present Illness: Alison Price is a 74 y.o. female seen for evaluation of nausea and vomiting and epigastric pain at the request of Dr. Alford Highland. Patient has a PMH of chronic pain syndrome on chronic narcotics, GERD, HTN, osteoporosis, and Hx of Stevens-Johnson syndrome. She reports that she suffers from chronic constipation most of her adult life and usually has bowel movements once a week. She also deals with chronic nausea and vomiting. Her nausea, vomiting, and constipation symptoms have been much worse over the past week. She reports she hasn't been able to tolerate any oral intake for the past 48 hours - everything she tries to put in her mouth will come back up, including food, liquids, and medications. She denies any bloody or bilious emesis episodes. She reports a "twisting and turning" pain located in her epigastric region that is rated "20 out of 10" in severity. She denies any fevers, chills, night sweats, dysphagia, odynophagia, early satiety, or unintentional weight loss. She denies any pyrosis or acid regurgitation symptoms. She reports in regards to her constipation she will frequently have to manual disimpact her remove and remove balls of stool from her rectum. She denies any hematochezia or melena. No fecal urgency, fecal incontinence, or lower abdominal cramping. She did have CT scan abd/pelvis which showed thickening of distal gastric antrum with small focal gas collection anteriorly at the distal antrum, question ulcer versus gastritis versus gastric antral tumor. There was also mention of wall thickening of the rectum and probably sigmoid colon raising question of distal colitis, most likely inflammatory bowel disease versus infection, ischemia considered a much less likely possibility.   Last EGD/Colonoscopy:  Unknown    Past Medical History:  Past Medical History:  Diagnosis Date  . Anemia   . Arthritis   . Chronic pain   . GERD (gastroesophageal reflux disease)   . Hypertension   . Osteoporosis   . Stevens-Johnson syndrome (HCC)     Problem List: Patient Active Problem List   Diagnosis Date Noted  . Abdominal pain 06/29/2019  . Hypomagnesemia   . Gastroesophageal reflux disease without esophagitis   . Hip fx (HCC) 08/05/2017  . Nausea and vomiting 10/23/2016  . Hypokalemia 10/23/2016  . Dehydration 10/23/2016  . Accelerated hypertension 10/23/2016  . History of total abdominal hysterectomy 06/26/2015  . Pelvic pressure in female 06/26/2015  . Vaginal atrophy 06/26/2015  . Hematuria 06/26/2015  . Dysuria 06/26/2015  . Pancytopenia (HCC) 04/13/2015    Past Surgical History: Past Surgical History:  Procedure Laterality Date  . ABDOMINAL HYSTERECTOMY     partial  . BACK SURGERY    . FOOT SURGERY    . HAND SURGERY     THUMB REPAIR  . HEMORRHOID SURGERY    . INTRAMEDULLARY (IM) NAIL INTERTROCHANTERIC Left 08/06/2017   Procedure: INTRAMEDULLARY (IM) NAIL INTERTROCHANTRIC;  Surgeon: Signa Kell, MD;  Location: [facility] ORS;  Service: Orthopedics;  Laterality: Left;  . POSTERIOR REPAIR      Allergies: Allergies  Allergen Reactions  . Codeine Nausea And Vomiting  . Sulfa Antibiotics Other (See Comments)    STEVEN JOHNSON SYNDROME    Home Medications: (Not in a hospital admission)  Home medication reconciliation was completed with the patient.   Scheduled Inpatient Medications:   . amLODipine  5 mg Oral Daily  . escitalopram  5 mg Oral  Daily  . fluticasone  2 spray Each Nare Daily  . lisinopril  20 mg Oral Daily  . [START ON 06/30/2019] loratadine  10 mg Oral Daily  . methylnaltrexone  4 mg Subcutaneous Once  . mometasone-formoterol  2 puff Inhalation BID  . morphine  60 mg Oral TID  . pantoprazole (PROTONIX) IV  40 mg Intravenous Q12H  . venlafaxine  75 mg Oral  Daily    Continuous Inpatient Infusions:   . 0.9 % NaCl with KCl 20 mEq / L    . piperacillin-tazobactam (ZOSYN)  IV 3.375 g (06/29/19 1356)  . potassium chloride      PRN Inpatient Medications:  acetaminophen **OR** acetaminophen, butalbital-acetaminophen-caffeine, dimenhyDRINATE, Ipratropium-Albuterol, labetalol, meclizine, morphine injection, ondansetron **OR** ondansetron (ZOFRAN) IV, oxyCODONE-acetaminophen, tiZANidine  Family History: family history includes Alcohol abuse in her father; Diabetes in her paternal grandmother; Stomach cancer in her maternal aunt; Uterine cancer in her maternal aunt.  The patient's family history is negative for inflammatory bowel disorders, GI malignancy, or solid organ transplantation.  Social History:   reports that she quit smoking about 33 years ago. She has never used smokeless tobacco. She reports that she does not drink alcohol or use drugs. The patient denies ETOH, tobacco, or drug use.   Review of Systems: Constitutional: Weight is stable.  Eyes: No changes in vision. ENT: No oral lesions, sore throat.  GI: see HPI.  Heme/Lymph: No easy bruising.  CV: No chest pain.  GU: No hematuria.  Integumentary: No rashes.  Neuro: No headaches.  Psych: No depression/anxiety.  Endocrine: No heat/cold intolerance.  Allergic/Immunologic: No urticaria.  Resp: No cough, SOB.  Musculoskeletal: No joint swelling.    Physical Examination: BP (!) 186/84   Pulse 76   Temp 98.7 F (37.1 C) (Oral)   Resp 20   Ht 5\' 6"  (1.676 m)   Wt 61.2 kg   SpO2 100%   BMI 21.79 kg/m  Gen: NAD, alert and oriented x 4 HEENT: PEERLA, EOMI, Neck: supple, no JVD or thyromegaly Chest: CTA bilaterally, no wheezes, crackles, or other adventitious sounds CV: RRR, no m/g/c/r Abd: soft, ND, +BS in all four quadrants; tender to light and deep palpation in epigastric region, no HSM, guarding, ridigity, or rebound tenderness Ext: no edema, well perfused with 2+  pulses, Skin: no rash or lesions noted Lymph: no LAD  Data: Lab Results  Component Value Date   WBC 12.8 (H) 06/29/2019   HGB 13.2 06/29/2019   HCT 37.9 06/29/2019   MCV 87.9 06/29/2019   PLT 203 06/29/2019   Recent Labs  Lab 06/29/19 1023  HGB 13.2   Lab Results  Component Value Date   NA 139 06/29/2019   K 2.6 (LL) 06/29/2019   CL 96 (L) 06/29/2019   CO2 27 06/29/2019   BUN 20 06/29/2019   CREATININE 0.55 06/29/2019   Lab Results  Component Value Date   ALT 10 06/29/2019   AST 28 06/29/2019   ALKPHOS 91 06/29/2019   BILITOT 1.3 (H) 06/29/2019   No results for input(s): APTT, INR, PTT in the last 168 hours.   CT abd/pelvis with contrast 06/29/2019: IMPRESSION: Thickening of the distal gastric antrum with a small focal gas collection anteriorly at the distal antrum, question ulcer versus gastritis versus gastric antral tumor; recommend correlation with upper endoscopy.  Wall thickening of the rectum and probably sigmoid colon raising question of distal colitis, most likely inflammatory bowel disease versus infection, ischemia considered a much less likely possibility.  BILATERAL nonobstructing renal calculi and renal cysts.  Chronic bibasilar pulmonary infiltrates, slightly increased  Aortic Atherosclerosis (ICD10-I70.0).   Assessment/Plan:  74 y/o Caucasian female with a PMH of chronic pain syndrome on chronic narcotics, GERD, HTN, osteoporosis, and Hx of Stevens-Johnson syndrome admitted for intractable nausea and vomiting and epigastric abd pain   1. Intractable nausea and vomiting 2. Epigastric abd pain 3. Abnormal CT scan - thickening of distal gastric antrum 4. Colitis - distal, wall thickening of recto-sigmoid colon 5. Chronic constipation - likely opoid-induced constipation  -Patient's clinical presentation and imaging are consistent with gastritis +/- H pylori, gastric ulcer, gastric antral tumor, GOO, duodenitis, etc -Agree with acid  suppression therapy -Advise endoscopy for luminal evaluation with biopsies for further evaluation. We discussed procedure details and indications today in ER. She consents to proceed. -Patient's clinical presentation is not consistent with infectious or inflammatory colitis at present time. No diarrhea, fecal urgency, lower abd pain, hematochezia, etc.  -I do not believe patient would tolerate an oral bowel prep for colonoscopy given her intractable nausea/vomiting symptoms -Clear liquid diet today. Npo after midnight tonight. -Plan for endoscopy tomorrow with Dr. Norma Fredrickson -See procedure note for findings and further recommendations   I reviewed the risks (including bleeding, perforation, infection, anesthesia complications, cardiac/respiratory complications), benefits and alternatives of EGD. Patient consents to proceed.    Thank you for the consult. Please call with questions or concerns.  Mickle Mallory [facility] [phone] (Cell)

## 2019-06-29 NOTE — H&P (View-Only) (Signed)
GI Inpatient Consult Note  Reason for Consult: Nausea and vomiting, epigastric abd pain    Attending Requesting Consult: Dr. Richard Wieting, MD  History of Present Illness: Alison Price is a 74 y.o. female seen for evaluation of nausea and vomiting and epigastric pain at the request of Dr. Richard Wieting. Patient has a PMH of chronic pain syndrome on chronic narcotics, GERD, HTN, osteoporosis, and Hx of Stevens-Johnson syndrome. She reports that she suffers from chronic constipation most of her adult life and usually has bowel movements once a week. She also deals with chronic nausea and vomiting. Her nausea, vomiting, and constipation symptoms have been much worse over the past week. She reports she hasn't been able to tolerate any oral intake for the past 48 hours - everything she tries to put in her mouth will come back up, including food, liquids, and medications. She denies any bloody or bilious emesis episodes. She reports a "twisting and turning" pain located in her epigastric region that is rated "20 out of 10" in severity. She denies any fevers, chills, night sweats, dysphagia, odynophagia, early satiety, or unintentional weight loss. She denies any pyrosis or acid regurgitation symptoms. She reports in regards to her constipation she will frequently have to manual disimpact her remove and remove balls of stool from her rectum. She denies any hematochezia or melena. No fecal urgency, fecal incontinence, or lower abdominal cramping. She did have CT scan abd/pelvis which showed thickening of distal gastric antrum with small focal gas collection anteriorly at the distal antrum, question ulcer versus gastritis versus gastric antral tumor. There was also mention of wall thickening of the rectum and probably sigmoid colon raising question of distal colitis, most likely inflammatory bowel disease versus infection, ischemia considered a much less likely possibility.   Last EGD/Colonoscopy:  Unknown    Past Medical History:  Past Medical History:  Diagnosis Date  . Anemia   . Arthritis   . Chronic pain   . GERD (gastroesophageal reflux disease)   . Hypertension   . Osteoporosis   . Stevens-Johnson syndrome (HCC)     Problem List: Patient Active Problem List   Diagnosis Date Noted  . Abdominal pain 06/29/2019  . Hypomagnesemia   . Gastroesophageal reflux disease without esophagitis   . Hip fx (HCC) 08/05/2017  . Nausea and vomiting 10/23/2016  . Hypokalemia 10/23/2016  . Dehydration 10/23/2016  . Accelerated hypertension 10/23/2016  . History of total abdominal hysterectomy 06/26/2015  . Pelvic pressure in female 06/26/2015  . Vaginal atrophy 06/26/2015  . Hematuria 06/26/2015  . Dysuria 06/26/2015  . Pancytopenia (HCC) 04/13/2015    Past Surgical History: Past Surgical History:  Procedure Laterality Date  . ABDOMINAL HYSTERECTOMY     partial  . BACK SURGERY    . FOOT SURGERY    . HAND SURGERY     THUMB REPAIR  . HEMORRHOID SURGERY    . INTRAMEDULLARY (IM) NAIL INTERTROCHANTERIC Left 08/06/2017   Procedure: INTRAMEDULLARY (IM) NAIL INTERTROCHANTRIC;  Surgeon: Patel, Sunny, MD;  Location: [facility] ORS;  Service: Orthopedics;  Laterality: Left;  . POSTERIOR REPAIR      Allergies: Allergies  Allergen Reactions  . Codeine Nausea And Vomiting  . Sulfa Antibiotics Other (See Comments)    STEVEN JOHNSON SYNDROME    Home Medications: (Not in a hospital admission)  Home medication reconciliation was completed with the patient.   Scheduled Inpatient Medications:   . amLODipine  5 mg Oral Daily  . escitalopram  5 mg Oral   Daily  . fluticasone  2 spray Each Nare Daily  . lisinopril  20 mg Oral Daily  . [START ON 06/30/2019] loratadine  10 mg Oral Daily  . methylnaltrexone  4 mg Subcutaneous Once  . mometasone-formoterol  2 puff Inhalation BID  . morphine  60 mg Oral TID  . pantoprazole (PROTONIX) IV  40 mg Intravenous Q12H  . venlafaxine  75 mg Oral  Daily    Continuous Inpatient Infusions:   . 0.9 % NaCl with KCl 20 mEq / L    . piperacillin-tazobactam (ZOSYN)  IV 3.375 g (06/29/19 1356)  . potassium chloride      PRN Inpatient Medications:  acetaminophen **OR** acetaminophen, butalbital-acetaminophen-caffeine, dimenhyDRINATE, Ipratropium-Albuterol, labetalol, meclizine, morphine injection, ondansetron **OR** ondansetron (ZOFRAN) IV, oxyCODONE-acetaminophen, tiZANidine  Family History: family history includes Alcohol abuse in her father; Diabetes in her paternal grandmother; Stomach cancer in her maternal aunt; Uterine cancer in her maternal aunt.  The patient's family history is negative for inflammatory bowel disorders, GI malignancy, or solid organ transplantation.  Social History:   reports that she quit smoking about 33 years ago. She has never used smokeless tobacco. She reports that she does not drink alcohol or use drugs. The patient denies ETOH, tobacco, or drug use.   Review of Systems: Constitutional: Weight is stable.  Eyes: No changes in vision. ENT: No oral lesions, sore throat.  GI: see HPI.  Heme/Lymph: No easy bruising.  CV: No chest pain.  GU: No hematuria.  Integumentary: No rashes.  Neuro: No headaches.  Psych: No depression/anxiety.  Endocrine: No heat/cold intolerance.  Allergic/Immunologic: No urticaria.  Resp: No cough, SOB.  Musculoskeletal: No joint swelling.    Physical Examination: BP (!) 186/84   Pulse 76   Temp 98.7 F (37.1 C) (Oral)   Resp 20   Ht 5\' 6"  (1.676 m)   Wt 61.2 kg   SpO2 100%   BMI 21.79 kg/m  Gen: NAD, alert and oriented x 4 HEENT: PEERLA, EOMI, Neck: supple, no JVD or thyromegaly Chest: CTA bilaterally, no wheezes, crackles, or other adventitious sounds CV: RRR, no m/g/c/r Abd: soft, ND, +BS in all four quadrants; tender to light and deep palpation in epigastric region, no HSM, guarding, ridigity, or rebound tenderness Ext: no edema, well perfused with 2+  pulses, Skin: no rash or lesions noted Lymph: no LAD  Data: Lab Results  Component Value Date   WBC 12.8 (H) 06/29/2019   HGB 13.2 06/29/2019   HCT 37.9 06/29/2019   MCV 87.9 06/29/2019   PLT 203 06/29/2019   Recent Labs  Lab 06/29/19 1023  HGB 13.2   Lab Results  Component Value Date   NA 139 06/29/2019   K 2.6 (LL) 06/29/2019   CL 96 (L) 06/29/2019   CO2 27 06/29/2019   BUN 20 06/29/2019   CREATININE 0.55 06/29/2019   Lab Results  Component Value Date   ALT 10 06/29/2019   AST 28 06/29/2019   ALKPHOS 91 06/29/2019   BILITOT 1.3 (H) 06/29/2019   No results for input(s): APTT, INR, PTT in the last 168 hours.   CT abd/pelvis with contrast 06/29/2019: IMPRESSION: Thickening of the distal gastric antrum with a small focal gas collection anteriorly at the distal antrum, question ulcer versus gastritis versus gastric antral tumor; recommend correlation with upper endoscopy.  Wall thickening of the rectum and probably sigmoid colon raising question of distal colitis, most likely inflammatory bowel disease versus infection, ischemia considered a much less likely possibility.  BILATERAL nonobstructing renal calculi and renal cysts.  Chronic bibasilar pulmonary infiltrates, slightly increased  Aortic Atherosclerosis (ICD10-I70.0).   Assessment/Plan:  74 y/o Caucasian female with a PMH of chronic pain syndrome on chronic narcotics, GERD, HTN, osteoporosis, and Hx of Stevens-Johnson syndrome admitted for intractable nausea and vomiting and epigastric abd pain   1. Intractable nausea and vomiting 2. Epigastric abd pain 3. Abnormal CT scan - thickening of distal gastric antrum 4. Colitis - distal, wall thickening of recto-sigmoid colon 5. Chronic constipation - likely opoid-induced constipation  -Patient's clinical presentation and imaging are consistent with gastritis +/- H pylori, gastric ulcer, gastric antral tumor, GOO, duodenitis, etc -Agree with acid  suppression therapy -Advise endoscopy for luminal evaluation with biopsies for further evaluation. We discussed procedure details and indications today in ER. She consents to proceed. -Patient's clinical presentation is not consistent with infectious or inflammatory colitis at present time. No diarrhea, fecal urgency, lower abd pain, hematochezia, etc.  -I do not believe patient would tolerate an oral bowel prep for colonoscopy given her intractable nausea/vomiting symptoms -Clear liquid diet today. Npo after midnight tonight. -Plan for endoscopy tomorrow with Dr. Norma Fredrickson -See procedure note for findings and further recommendations   I reviewed the risks (including bleeding, perforation, infection, anesthesia complications, cardiac/respiratory complications), benefits and alternatives of EGD. Patient consents to proceed.    Thank you for the consult. Please call with questions or concerns.  Mickle Mallory [facility] [phone] (Cell)

## 2019-06-29 NOTE — ED Triage Notes (Addendum)
Patient from home via [facility]. Per EMS, patient has been vomiting x4 days. States her last normal bowel movement was 2 weeks ago. Patient reports history of constipation but reports this is the longest she has gone without a bowel movement. Reports no relief with OTC meds.   Given 4mg  IM zofran PTA.

## 2019-06-29 NOTE — ED Notes (Signed)
Patient c/o nausea and when informed that zofran wasn't due for a couple of hours, patient asked for a different med for nausea. Patient was given phenergan prn for nausea and tylenol 650mg  PO for abdominal pain. Patient declined Percocet or Morphine.

## 2019-06-29 NOTE — Consult Note (Signed)
Pharmacy Antibiotic Note  Alison Price is a 74 y.o. female admitted on 06/29/2019 with intra-abdominal infection.  Pharmacy has been consulted for Zosyn dosing.  Plan: Zosyn 3.375g IV q8h (4 hour infusion).  Height: 5\' 6"  (167.6 cm) Weight: 61.2 kg (135 lb) IBW/kg (Calculated) : 59.3  Temp (24hrs), Avg:98.7 F (37.1 C), Min:98.7 F (37.1 C), Max:98.7 F (37.1 C)  Recent Labs  Lab 06/29/19 1023 06/29/19 1140 06/29/19 1256  WBC 12.8*  --   --   CREATININE 0.55  --   --   LATICACIDVEN  --  1.5 1.1    Estimated Creatinine Clearance: 58.6 mL/min (by C-G formula based on SCr of 0.55 mg/dL).    Allergies  Allergen Reactions  . Codeine Nausea And Vomiting  . Sulfa Antibiotics Other (See Comments)    STEVEN JOHNSON SYNDROME    Antimicrobials this admission: Zosyn 5/12 >>   Microbiology results: 5/12 BCx: pending 5/12 UCx: pending   Thank you for allowing pharmacy to be a part of this patient's care.  Alfie Rideaux A Malaia Buchta 06/29/2019 3:01 PM

## 2019-06-29 NOTE — ED Notes (Signed)
Pt leaving ED at this time. Pt clean and dry prior to ED departure

## 2019-06-29 NOTE — ED Notes (Signed)
Patient given "a big cup of ice" per patient's request with apple juice. Patient given another warm blanket for comfort. Patient had spilled her water on her gown earlier which was changed and warm blanket given at that time. Patient c/o occasionally nausea, but has not vomited that this writer has seen since 1900.

## 2019-06-29 NOTE — ED Notes (Signed)
Report given to Sarah RN

## 2019-06-29 NOTE — H&P (Signed)
[facility] [place] [facility]   PATIENT NAME: Alison Price    MR#:  [id number]  DATE OF BIRTH:  1945/08/11  DATE OF ADMISSION:  06/29/2019  PRIMARY CARE PHYSICIAN: Jaclyn Shaggy, MD   REQUESTING/REFERRING PHYSICIAN: Dr Shaune Pollack  CHIEF COMPLAINT:   Chief Complaint  Patient presents with  . Emesis  . Constipation    HISTORY OF PRESENT ILLNESS:  Alison Price  is a 74 y.o. female with a known history of chronic pain and constipation presents with nausea vomiting going on for the past 3 days.  She states that she is vomiting every 10 minutes to many times to count.  She has not noted any blood in the vomit.  Pain is described as moderate 6 out of 10 in intensity in the center of her abdomen.  She did have a few bowel movements overnight.  Hospitalist services were contacted for further evaluation.  No fever or chills.  No weight loss.  At times is noted some bright red blood per rectum.  PAST MEDICAL HISTORY:   Past Medical History:  Diagnosis Date  . Anemia   . Arthritis   . Chronic pain   . GERD (gastroesophageal reflux disease)   . Hypertension   . Osteoporosis   . Stevens-Johnson syndrome (HCC)     PAST SURGICAL HISTORY:   Past Surgical History:  Procedure Laterality Date  . ABDOMINAL HYSTERECTOMY     partial  . BACK SURGERY    . FOOT SURGERY    . HAND SURGERY     THUMB REPAIR  . HEMORRHOID SURGERY    . INTRAMEDULLARY (IM) NAIL INTERTROCHANTERIC Left 08/06/2017   Procedure: INTRAMEDULLARY (IM) NAIL INTERTROCHANTRIC;  Surgeon: Signa Kell, MD;  Location: [facility] ORS;  Service: Orthopedics;  Laterality: Left;  . POSTERIOR REPAIR      SOCIAL HISTORY:   Social History   Tobacco Use  . Smoking status: Former Smoker    Quit date: 02/27/1986    Years since quitting: 33.3  . Smokeless tobacco: Never Used  Substance Use Topics  . Alcohol use: No    FAMILY HISTORY:   Family History  Problem Relation Age of Onset  . Diabetes  Paternal Grandmother   . Uterine cancer Maternal Aunt   . Stomach cancer Maternal Aunt   . Alcohol abuse Father   . Breast cancer Neg Hx   . Ovarian cancer Neg Hx   . Colon cancer Neg Hx   . Heart disease Neg Hx     DRUG ALLERGIES:   Allergies  Allergen Reactions  . Codeine Nausea And Vomiting  . Sulfa Antibiotics Other (See Comments)    STEVEN JOHNSON SYNDROME    REVIEW OF SYSTEMS:  CONSTITUTIONAL: No fever, chills or sweats.  Positive for fatigue.  EYES: Wears glasses. EARS, NOSE, AND THROAT: No tinnitus or ear pain. No sore throat RESPIRATORY: No cough,  wheezing or hemoptysis.  Positive for shortness of breath with activity CARDIOVASCULAR: No chest pain, orthopnea, edema.  GASTROINTESTINAL: Positive for nausea, vomiting, constipation and abdominal pain.  Occasional bright red blood per rectum GENITOURINARY: No dysuria, hematuria.  ENDOCRINE: No polyuria, nocturia,  HEMATOLOGY: No anemia, easy bruising or bleeding SKIN: No rash or lesion. MUSCULOSKELETAL: No joint pain or arthritis.   NEUROLOGIC: No tingling, numbness, weakness.  PSYCHIATRY: No anxiety or depression.   MEDICATIONS AT HOME:   Prior to Admission medications   Medication Sig Start Date End Date Taking? Authorizing Provider  alum & mag hydroxide-simeth (MAALOX  PLUS) 400-400-40 MG/5ML suspension Take 30 mLs by mouth every 4 (four) hours as needed.    [provider]  amLODipine (NORVASC) 5 MG tablet Take 5 mg by mouth daily. 03/08/18   [provider]  aspirin EC 81 MG tablet Take 81 mg by mouth daily.    [provider]  butalbital-acetaminophen-caffeine (FIORICET, ESGIC) 50-325-40 MG tablet Take 1 tablet by mouth every 6 (six) hours as needed for headache or migraine.    [provider]  Cholecalciferol (VITAMIN D) 2000 units CAPS Take 6,000 Units by mouth daily.     [provider]  Choline Fenofibrate (FENOFIBRIC ACID) 135 MG CPDR Take 135 mg by mouth daily.      [provider]  conjugated estrogens (PREMARIN) vaginal cream Place 1 Applicatorful vaginally See admin instructions. Daily on Sunday and Wednesday    [provider]  dimenhyDRINATE (DRAMAMINE) 50 MG tablet Take 50 mg by mouth every 8 (eight) hours as needed. N/V (usually takes with Morphine and oxycodone)    [provider]  escitalopram (LEXAPRO) 5 MG tablet Take 5 mg by mouth daily. 03/08/18   [provider]  ferrous sulfate 325 (65 FE) MG tablet Take 325 mg by mouth 3 (three) times daily with meals.    [provider]  fexofenadine (ALLEGRA) 180 MG tablet Take 180 mg by mouth daily.    [provider]  fluticasone (FLONASE) 50 MCG/ACT nasal spray Place 2 sprays into both nostrils daily.    [provider]  Fluticasone-Salmeterol (ADVAIR) 250-50 MCG/DOSE AEPB Inhale 1 puff into the lungs 2 (two) times daily.    [provider]  Ipratropium-Albuterol (COMBIVENT RESPIMAT) 20-100 MCG/ACT AERS respimat Inhale 2 puffs into the lungs every 6 (six) hours as needed for wheezing or shortness of breath.    [provider]  lisinopril (ZESTRIL) 20 MG tablet Take 20 mg by mouth daily. 03/08/18   [provider]  meclizine (ANTIVERT) 25 MG tablet Take 25 mg by mouth 2 (two) times daily as needed for dizziness.    [provider]  morphine (MS CONTIN) 60 MG 12 hr tablet Take 1 tablet (60 mg total) by mouth 3 (three) times daily. 08/19/17   Sharee Holster, NP  Multiple Vitamin (MULTIVITAMIN) tablet Take 1 tablet by mouth daily.    [provider]  ondansetron (ZOFRAN ODT) 4 MG disintegrating tablet Take 1 tablet (4 mg total) by mouth every 8 (eight) hours as needed for nausea or vomiting. 03/27/18   Emily Filbert, MD  oxyCODONE-acetaminophen (PERCOCET/ROXICET) 5-325 MG tablet Take 1 tablet by mouth every 4 (four) hours as needed for moderate pain or severe pain.    [provider]   pantoprazole (PROTONIX) 40 MG tablet Take 40 mg by mouth daily.     [provider]  polyethylene glycol (MIRALAX / GLYCOLAX) 17 g packet Take 17 g by mouth daily. 06/13/18   Alford Highland, MD  senna-docusate (SENOKOT-S) 8.6-50 MG tablet Take 1 tablet by mouth 2 (two) times daily. 06/12/18   Alford Highland, MD  tiZANidine (ZANAFLEX) 4 MG tablet Take 4 mg by mouth every 6 (six) hours as needed for muscle spasms.  01/17/15   [provider]  venlafaxine (EFFEXOR) 75 MG tablet Take 75 mg by mouth daily.    [provider]  vitamin C (ASCORBIC ACID) 500 MG tablet Take 500 mg by mouth 3 (three) times daily. anemia- give with ferrous sulfate to potentiate iron absorption  [provider]      VITAL SIGNS:  Blood pressure (!) 190/106, pulse 69, temperature 98.7 F (37.1 C), temperature source Oral, resp. rate 17, height 5\' 6"  (1.676 m), weight 61.2 kg, SpO2 100 %.  PHYSICAL EXAMINATION:  GENERAL:  74 y.o.-year-old patient lying in the bed with no acute distress.  EYES: Pupils equal, round, reactive to light and accommodation. No scleral icterus. Extraocular muscles intact.  HEENT: Head atraumatic, normocephalic. Oropharynx and nasopharynx clear.  NECK:  Supple, no jugular venous distention. No thyroid enlargement, no tenderness.  LUNGS: Normal breath sounds bilaterally, no wheezing, rales,rhonchi or crepitation. No use of accessory muscles of respiration.  CARDIOVASCULAR: S1, S2 normal. No murmurs, rubs, or gallops.  ABDOMEN: Soft, tender, nondistended. Bowel sounds present. No organomegaly or mass.  EXTREMITIES: No pedal edema, cyanosis, or clubbing.  NEUROLOGIC: Cranial nerves II through XII are intact. Muscle strength 5/5 in all extremities. Sensation intact. Gait not checked.  PSYCHIATRIC: The patient is alert and oriented x 3.  SKIN: No rash, lesion, or ulcer.   LABORATORY PANEL:   CBC Recent Labs  Lab 06/29/19 1023  WBC 12.8*  HGB 13.2  HCT  37.9  PLT 203   ------------------------------------------------------------------------------------------------------------------  Chemistries  Recent Labs  Lab 06/29/19 1023  NA 139  K 2.6*  CL 96*  CO2 27  GLUCOSE 143*  BUN 20  CREATININE 0.55  CALCIUM 10.0  MG 1.6*  AST 28  ALT 10  ALKPHOS 91  BILITOT 1.3*   ------------------------------------------------------------------------------------------------------------------    RADIOLOGY:  CT ABDOMEN PELVIS W CONTRAST  Result Date: 06/29/2019 CLINICAL DATA:  Pain for 4 days, last normal bowel movement 2 weeks ago, history of constipation without relief from over-the-counter medications, hypertension, GERD, history Stevens-Johnson syndrome, former smoker EXAM: CT ABDOMEN AND PELVIS WITH CONTRAST TECHNIQUE: Multidetector CT imaging of the abdomen and pelvis was performed using the standard protocol following bolus administration of intravenous contrast. Sagittal and coronal MPR images reconstructed from axial data set. CONTRAST:  08/29/2019 OMNIPAQUE IOHEXOL 300 MG/ML SOLN IV. No oral contrast. COMPARISON:  06/10/2018 FINDINGS: Lower chest: Chronic bibasilar pulmonary infiltrates, slightly increased. No pleural effusion. Hepatobiliary: Gallbladder and liver normal appearance Pancreas: Normal appearance Spleen: Normal appearance Adrenals/Urinary Tract: Adrenal glands normal appearance. BILATERAL renal cysts. BILATERAL renal calculi, largest 10 x 7 mm at inferior pole RIGHT kidney and 20 x 12 mm inferior pole LEFT kidney. No solid renal mass, hydronephrosis or hydroureter. Bladder unremarkable. Stomach/Bowel: Colon interposition between liver and diaphragm, including ileocecal valve. Appendix not definitely localized but no pericecal inflammatory changes are seen to suggest acute appendicitis. Wall thickening of rectum and probably sigmoid colon raising question of distal colitis. Proximal colon and small bowel loops unremarkable. Normal  retained stool burden. Thickening of the distal gastric antrum with a small focal gas collection anteriorly at the distal antrum, question ulcer versus gastritis versus gastric antral tumor. Proximal stomach unremarkable. Vascular/Lymphatic: Atherosclerotic calcifications aorta, iliac arteries, coronary arteries. Aorta normal caliber. No definite adenopathy. Reproductive: Uterus surgically absent. Nonvisualization of ovaries. Other: No free air or free fluid. No hernia. Musculoskeletal: Osseous demineralization with prior L4-L5 Ray cage fusion and old superior endplate deformities of T11, L1 and L3. LEFT psoas atrophy. IMPRESSION: Thickening of the distal gastric antrum with a small focal gas collection anteriorly at the distal antrum, question ulcer versus gastritis versus gastric antral tumor; recommend correlation with upper endoscopy. Wall thickening of the rectum and probably sigmoid colon raising question of distal colitis, most likely inflammatory bowel disease versus  infection, ischemia considered a much less likely possibility. BILATERAL nonobstructing renal calculi and renal cysts. Chronic bibasilar pulmonary infiltrates, slightly increased Aortic Atherosclerosis (ICD10-I70.0). Electronically Signed   By: Lavonia Dana M.D.   On: 06/29/2019 11:46    EKG:   Interpreted by me.  Normal sinus rhythm 76 bpm  IMPRESSION AND PLAN:   1.  Abdominal pain with nausea vomiting and constipation.  Patient on chronic pain medication and with the nausea vomiting could have withdrawal from pain medications.  We will give IV morphine.  Continue her MS Contin and Percocet if she is able to tolerate.  As needed nausea medication.  IV fluid hydration.  We will give Relistor for opioid-induced constipation. 2.  Abnormal CT scan of the abdomen and pelvis which could be a gastric ulcer.  Possible colitis.  GI for endoscopy.  Empiric antibiotic for now.  IV Protonix 3.  Hypomagnesemia and hypokalemia.  Replace potassium  IV and magnesium IV and recheck electrolytes tomorrow. 4.  Chronic pain continue her usual oral medications if she is able to tolerate 5.  GERD I change the PPI to IV 6.  Lipase slightly elevated likely from all the nausea vomiting 7.  Impaired fasting glucose.  Add on a hemoglobin A1c 8.  Accelerated hypertension likely secondary to vomiting.  Continue oral medications.  As needed IV medication  Patient meets criteria for inpatient admission with severe abdominal pain and nausea vomiting and unable to keep any food down and also with the severe electrolyte abnormalities.  All the records, laboratory and radiological data are reviewed and case discussed with ED provider. Management plans discussed with the patient, and she is in agreement.  She deferred me calling family.  CODE STATUS: Full code  TOTAL TIME TAKING CARE OF THIS PATIENT: 50 minutes.    Loletha Grayer M.D on 06/29/2019 at 12:59 PM  Between 7am to 6pm - Pager - [phone]  After 6pm call admission pager [phone]  [facility]  CC: Primary care physician; Albina Billet, MD

## 2019-06-29 NOTE — ED Notes (Addendum)
This tech and Laura,RN provided pt with pericare and linen change. Applied purewick and dry brief.

## 2019-06-29 NOTE — ED Provider Notes (Signed)
[facility] Emergency Department Provider Note  ____________________________________________   First MD Initiated Contact with Patient 06/29/19 1017     (approximate)  I have reviewed the triage vital signs and the nursing notes.   HISTORY  Chief Complaint Emesis and Constipation    HPI Alison Price is a 74 y.o. female here with abdominal pain, nausea, vomiting, and constipation.  Patient states that she has a history of recurrent constipation, nausea, and vomiting.  She states that over the last week, she has had difficulty having a bowel movement.  She has had subsequently worsening nausea, vomiting, and has been essentially unable to eat or drink for the last 48 hours.  She said associated worsening diffuse, severe, generalized but primarily upper abdominal pain.  She has had some nonbloody emesis.  She has been unable to tolerate any of her medications.  This includes her usual pain medications.  Denies any alleviating factors.  No fevers or chills.  No urinary symptoms.        Past Medical History:  Diagnosis Date  . Anemia   . Arthritis   . Chronic pain   . GERD (gastroesophageal reflux disease)   . Hypertension   . Osteoporosis   . Stevens-Johnson syndrome [facility](HCC)     Patient Active Problem List   Diagnosis Date Noted  . Abdominal pain 06/29/2019  . Hypomagnesemia   . Gastroesophageal reflux disease without esophagitis   . Hip fx (HCC) 08/05/2017  . Nausea and vomiting 10/23/2016  . Hypokalemia 10/23/2016  . Dehydration 10/23/2016  . Accelerated hypertension 10/23/2016  . History of total abdominal hysterectomy 06/26/2015  . Pelvic pressure in female 06/26/2015  . Vaginal atrophy 06/26/2015  . Hematuria 06/26/2015  . Dysuria 06/26/2015  . Pancytopenia (HCC) 04/13/2015    Past Surgical History:  Procedure Laterality Date  . ABDOMINAL HYSTERECTOMY     partial  . BACK SURGERY    . FOOT SURGERY    . HAND SURGERY     THUMB  REPAIR  . HEMORRHOID SURGERY    . INTRAMEDULLARY (IM) NAIL INTERTROCHANTERIC Left 08/06/2017   Procedure: INTRAMEDULLARY (IM) NAIL INTERTROCHANTRIC;  Surgeon: Signa KellPatel, Sunny, MD;  Location: [facility] ORS;  Service: Orthopedics;  Laterality: Left;  . POSTERIOR REPAIR      Prior to Admission medications   Medication Sig Start Date End Date Taking? Authorizing Provider  alum & mag hydroxide-simeth (MAALOX PLUS) 400-400-40 MG/5ML suspension Take 30 mLs by mouth every 4 (four) hours as needed for indigestion.    Yes [provider]  aspirin EC 81 MG tablet Take 81 mg by mouth daily.   Yes [provider]  butalbital-acetaminophen-caffeine (FIORICET, ESGIC) 50-325-40 MG tablet Take 1-2 tablets by mouth every 6 (six) hours as needed for headache or migraine.    Yes [provider]  Cholecalciferol (VITAMIN D) 2000 units CAPS Take 6,000 Units by mouth daily.    Yes [provider]  dimenhyDRINATE (DRAMAMINE) 50 MG tablet Take 50 mg by mouth every 8 (eight) hours as needed for nausea.    Yes [provider]  ferrous sulfate 325 (65 FE) MG tablet Take 325 mg by mouth 3 (three) times daily with meals.   Yes [provider]  fexofenadine (ALLEGRA) 180 MG tablet Take 180 mg by mouth daily.   Yes [provider]  fluticasone (FLONASE) 50 MCG/ACT nasal spray Place 2 sprays into both nostrils daily as needed for allergies or rhinitis.    Yes [provider]  lisinopril (ZESTRIL) 20 MG tablet Take 20 mg by mouth daily. 03/08/18  Yes [provider]  meclizine (ANTIVERT) 25 MG tablet Take 25 mg by mouth 2 (two) times daily as needed for dizziness.   Yes [provider]  morphine (MS CONTIN) 60 MG 12 hr tablet Take 1 tablet (60 mg total) by mouth 3 (three) times daily. 08/19/17  Yes Gerlene Fee, NP  Multiple Vitamin (MULTIVITAMIN) tablet Take 1 tablet by mouth daily.   Yes [provider]  Oxycodone HCl 10 MG TABS Take 10  mg by mouth 3 (three) times daily. 06/10/19  Yes [provider]  polyethylene glycol (MIRALAX / GLYCOLAX) 17 g packet Take 17 g by mouth daily. 06/13/18  Yes Wieting, Richard, MD  senna-docusate (SENOKOT-S) 8.6-50 MG tablet Take 1 tablet by mouth 2 (two) times daily. 06/12/18  Yes Wieting, Richard, MD  vitamin C (ASCORBIC ACID) 500 MG tablet Take 500 mg by mouth 3 (three) times daily. anemia- give with ferrous sulfate to potentiate iron absorption   Yes [provider]  amLODipine (NORVASC) 5 MG tablet Take 5 mg by mouth daily. 03/08/18   [provider]  Choline Fenofibrate (FENOFIBRIC ACID) 135 MG CPDR Take 135 mg by mouth daily.     [provider]  escitalopram (LEXAPRO) 5 MG tablet Take 5 mg by mouth daily. 03/08/18   [provider]    Allergies Codeine and Sulfa antibiotics  Family History  Problem Relation Age of Onset  . Diabetes Paternal Grandmother   . Uterine cancer Maternal Aunt   . Stomach cancer Maternal Aunt   . Alcohol abuse Father   . Breast cancer Neg Hx   . Ovarian cancer Neg Hx   . Colon cancer Neg Hx   . Heart disease Neg Hx     Social History Social History   Tobacco Use  . Smoking status: Former Smoker    Quit date: 02/27/1986    Years since quitting: 33.3  . Smokeless tobacco: Never Used  Substance Use Topics  . Alcohol use: No  . Drug use: No    Review of Systems  Review of Systems  Constitutional: Positive for fatigue. Negative for fever.  HENT: Negative for congestion and sore throat.   Eyes: Negative for visual disturbance.  Respiratory: Negative for cough and shortness of breath.   Cardiovascular: Negative for chest pain.  Gastrointestinal: Positive for abdominal pain, constipation, nausea and vomiting. Negative for diarrhea.  Genitourinary: Negative for flank pain.  Musculoskeletal: Negative for back pain and neck pain.  Skin: Negative for rash and wound.  Neurological: Positive for weakness.  All  other systems reviewed and are negative.    ____________________________________________  PHYSICAL EXAM:      VITAL SIGNS: ED Triage Vitals  Enc Vitals Group     BP 06/29/19 1013 (!) 162/114     Pulse Rate 06/29/19 1013 69     Resp 06/29/19 1013 17     Temp 06/29/19 1013 98.7 F (37.1 C)     Temp Source 06/29/19 1013 Oral     SpO2 06/29/19 1013 100 %     Weight 06/29/19 1010 135 lb (61.2 kg)     Height 06/29/19 1010 5\' 6"  (1.676 m)     Head Circumference --      Peak Flow --      Pain Score 06/29/19 1010 10     Pain Loc --      Pain Edu? --  Excl. in GC? --      Physical Exam Vitals and nursing note reviewed.  Constitutional:      General: She is not in acute distress.    Appearance: She is well-developed.  HENT:     Head: Normocephalic and atraumatic.     Mouth/Throat:     Mouth: Mucous membranes are dry.  Eyes:     Conjunctiva/sclera: Conjunctivae normal.  Cardiovascular:     Rate and Rhythm: Normal rate and regular rhythm.     Heart sounds: Normal heart sounds. No murmur. No friction rub.  Pulmonary:     Effort: Pulmonary effort is normal. No respiratory distress.     Breath sounds: Normal breath sounds. No wheezing or rales.  Abdominal:     General: Abdomen is flat. There is distension.     Palpations: Abdomen is soft.     Tenderness: There is abdominal tenderness.     Comments: Hyperactive bowel sounds  Musculoskeletal:     Cervical back: Neck supple.  Skin:    General: Skin is warm.     Capillary Refill: Capillary refill takes less than 2 seconds.  Neurological:     Mental Status: She is alert and oriented to person, place, and time.     Motor: No abnormal muscle tone.       ____________________________________________   LABS (all labs ordered are listed, but only abnormal results are displayed)  Labs Reviewed  LIPASE, BLOOD - Abnormal; Notable for the following components:      Result Value   Lipase 78 (*)    All other components  within normal limits  COMPREHENSIVE METABOLIC PANEL - Abnormal; Notable for the following components:   Potassium 2.6 (*)    Chloride 96 (*)    Glucose, Bld 143 (*)    Total Protein 8.9 (*)    Total Bilirubin 1.3 (*)    Anion gap 16 (*)    All other components within normal limits  CBC - Abnormal; Notable for the following components:   WBC 12.8 (*)    All other components within normal limits  URINALYSIS, COMPLETE (UACMP) WITH MICROSCOPIC - Abnormal; Notable for the following components:   Color, Urine YELLOW (*)    APPearance HAZY (*)    Hgb urine dipstick MODERATE (*)    Ketones, ur 5 (*)    Protein, ur >=300 (*)    Bacteria, UA MANY (*)    All other components within normal limits  MAGNESIUM - Abnormal; Notable for the following components:   Magnesium 1.6 (*)    All other components within normal limits  TROPONIN I (HIGH SENSITIVITY) - Abnormal; Notable for the following components:   Troponin I (High Sensitivity) 22 (*)    All other components within normal limits  SARS CORONAVIRUS 2 BY RT PCR (HOSPITAL ORDER, PERFORMED IN [facility])  CULTURE, BLOOD (ROUTINE X 2)  CULTURE, BLOOD (ROUTINE X 2)  URINE CULTURE  LACTIC ACID, PLASMA  LACTIC ACID, PLASMA  HEMOGLOBIN A1C  TROPONIN I (HIGH SENSITIVITY)    ____________________________________________  EKG: Sinus rhythm with frequent PVCs and apparent bigeminy.  Ventricular rate 55.  QRS 100, QTc 439.  Nonspecific ST depression, worse in inferior leads.  No ST elevations. ________________________________________  RADIOLOGY All imaging, including plain films, CT scans, and ultrasounds, independently reviewed by me, and interpretations confirmed via formal radiology reads.  ED MD interpretation:   CT A/P: Gastritis vs PUD, colitis, no perforation or obstruction  Official radiology report(s): CT  ABDOMEN PELVIS W CONTRAST  Result Date: 06/29/2019 CLINICAL DATA:  Pain for 4 days, last normal bowel movement 2  weeks ago, history of constipation without relief from over-the-counter medications, hypertension, GERD, history Stevens-Johnson syndrome, former smoker EXAM: CT ABDOMEN AND PELVIS WITH CONTRAST TECHNIQUE: Multidetector CT imaging of the abdomen and pelvis was performed using the standard protocol following bolus administration of intravenous contrast. Sagittal and coronal MPR images reconstructed from axial data set. CONTRAST:  OMNIPAQUE IOHEXOL 300 MG/ML SOLN IV. No oral contrast. COMPARISON:  06/10/2018 FINDINGS: Lower chest: Chronic bibasilar pulmonary infiltrates, slightly increased. No pleural effusion. Hepatobiliary: Gallbladder and liver normal appearance Pancreas: Normal appearance Spleen: Normal appearance Adrenals/Urinary Tract: Adrenal glands normal appearance. BILATERAL renal cysts. BILATERAL renal calculi, largest 10 x 7 mm at inferior pole RIGHT kidney and 20 x 12 mm inferior pole LEFT kidney. No solid renal mass, hydronephrosis or hydroureter. Bladder unremarkable. Stomach/Bowel: Colon interposition between liver and diaphragm, including ileocecal valve. Appendix not definitely localized but no pericecal inflammatory changes are seen to suggest acute appendicitis. Wall thickening of rectum and probably sigmoid colon raising question of distal colitis. Proximal colon and small bowel loops unremarkable. Normal retained stool burden. Thickening of the distal gastric antrum with a small focal gas collection anteriorly at the distal antrum, question ulcer versus gastritis versus gastric antral tumor. Proximal stomach unremarkable. Vascular/Lymphatic: Atherosclerotic calcifications aorta, iliac arteries, coronary arteries. Aorta normal caliber. No definite adenopathy. Reproductive: Uterus surgically absent. Nonvisualization of ovaries. Other: No free air or free fluid. No hernia. Musculoskeletal: Osseous demineralization with prior L4-L5 Ray cage fusion and old superior endplate deformities of T11,  L1 and L3. LEFT psoas atrophy. IMPRESSION: Thickening of the distal gastric antrum with a small focal gas collection anteriorly at the distal antrum, question ulcer versus gastritis versus gastric antral tumor; recommend correlation with upper endoscopy. Wall thickening of the rectum and probably sigmoid colon raising question of distal colitis, most likely inflammatory bowel disease versus infection, ischemia considered a much less likely possibility. BILATERAL nonobstructing renal calculi and renal cysts. Chronic bibasilar pulmonary infiltrates, slightly increased Aortic Atherosclerosis (ICD10-I70.0). Electronically Signed   By: Ulyses Southward M.D.   On: 06/29/2019 11:46    ____________________________________________  PROCEDURES   Procedure(s) performed (including Critical Care):  .1-3 Lead EKG Interpretation Performed by: Shaune Pollack, MD Authorized by: Shaune Pollack, MD     Interpretation: non-specific     ECG rate:  70s   ECG rate assessment: normal     Rhythm: sinus rhythm     Ectopy: PVCs     Conduction: normal   Comments:     Indication: Chest pain, receiving analgesics, abdominal pain    ____________________________________________  INITIAL IMPRESSION / MDM / ASSESSMENT AND PLAN / ED COURSE  As part of my medical decision making, I reviewed the following data within the electronic MEDICAL RECORD NUMBER Nursing notes reviewed and incorporated, Old chart reviewed, Notes from prior ED visits, and [place] Controlled Substance Database       *DMIYAH LISCANO was evaluated in Emergency Department on 06/29/2019 for the symptoms described in the history of present illness. She was evaluated in the context of the global COVID-19 pandemic, which necessitated consideration that the patient might be at risk for infection with the SARS-CoV-2 virus that causes COVID-19. Institutional protocols and algorithms that pertain to the evaluation of patients at risk for COVID-19 are in a state of  rapid change based on information released by regulatory bodies including the CDC and federal and  state organizations. These policies and algorithms were followed during the patient's care in the ED.  Some ED evaluations and interventions may be delayed as a result of limited staffing during the pandemic.*     Medical Decision Making:  74 yo F here with diffuse, severe abdominal pain. Labs show significant hypokalemia, leukocytosis likely 2/2 dehydration and acute colitis. CT imaging obtained and shows possible peptic ulcer as well as colitis. Suspect primary colitis with vomiting causing CT changes. LA normal making ischemic disease less likely. Will admit, start on empiric ABX. Cultures sent.  ____________________________________________  FINAL CLINICAL IMPRESSION(S) / ED DIAGNOSES  Final diagnoses:  Colitis  Dehydration  Hypokalemia     MEDICATIONS GIVEN DURING THIS VISIT:  Medications  piperacillin-tazobactam (ZOSYN) IVPB 3.375 g (3.375 g Intravenous New Bag/Given 06/29/19 1356)  acetaminophen (TYLENOL) tablet 650 mg (has no administration in time range)    Or  acetaminophen (TYLENOL) suppository 650 mg (has no administration in time range)  ondansetron (ZOFRAN) tablet 4 mg ( Oral See Alternative 06/29/19 1557)    Or  ondansetron (ZOFRAN) injection 4 mg (4 mg Intravenous Given 06/29/19 1557)  butalbital-acetaminophen-caffeine (FIORICET) 50-325-40 MG per tablet 1 tablet (has no administration in time range)  morphine (MS CONTIN) 12 hr tablet 60 mg (has no administration in time range)  oxyCODONE-acetaminophen (PERCOCET/ROXICET) 5-325 MG per tablet 1 tablet (has no administration in time range)  amLODipine (NORVASC) tablet 5 mg (has no administration in time range)  lisinopril (ZESTRIL) tablet 20 mg (has no administration in time range)  escitalopram (LEXAPRO) tablet 5 mg (has no administration in time range)  venlafaxine (EFFEXOR) tablet 75 mg (has no administration in time range)   meclizine (ANTIVERT) tablet 25 mg (has no administration in time range)  dimenhyDRINATE (DRAMAMINE) tablet 50 mg (has no administration in time range)  tiZANidine (ZANAFLEX) tablet 4 mg (has no administration in time range)  loratadine (CLARITIN) tablet 10 mg (has no administration in time range)  fluticasone (FLONASE) 50 MCG/ACT nasal spray 2 spray (has no administration in time range)  mometasone-formoterol (DULERA) 200-5 MCG/ACT inhaler 2 puff (has no administration in time range)  Ipratropium-Albuterol (COMBIVENT) respimat 2 puff (has no administration in time range)  morphine 2 MG/ML injection 2 mg (2 mg Intravenous Given 06/29/19 1555)  pantoprazole (PROTONIX) injection 40 mg (has no administration in time range)  methylnaltrexone (RELISTOR) injection 4 mg (has no administration in time range)  0.9 % NaCl with KCl 20 mEq/ L  infusion (has no administration in time range)  potassium chloride 10 mEq in 100 mL IVPB (10 mEq Intravenous New Bag/Given 06/29/19 1603)  labetalol (NORMODYNE) injection 5 mg (has no administration in time range)  piperacillin-tazobactam (ZOSYN) IVPB 3.375 g (has no administration in time range)  morphine 4 MG/ML injection 4 mg (4 mg Intravenous Given 06/29/19 1058)  ondansetron (ZOFRAN) injection 4 mg (4 mg Intravenous Given 06/29/19 1049)  sodium chloride 0.9 % bolus 1,000 mL (0 mLs Intravenous Stopped 06/29/19 1138)  potassium chloride SA (KLOR-CON) CR tablet 40 mEq (40 mEq Oral Given 06/29/19 1139)  potassium chloride 10 mEq in 100 mL IVPB (0 mEq Intravenous Stopped 06/29/19 1251)  morphine 2 MG/ML injection 2 mg (2 mg Intravenous Given 06/29/19 1144)  iohexol (OMNIPAQUE) 300 MG/ML solution 100 mL (100 mLs Intravenous Contrast Given 06/29/19 1112)  HYDROmorphone (DILAUDID) injection 0.5 mg (0.5 mg Intravenous Given 06/29/19 1248)  pantoprazole (PROTONIX) injection 40 mg (40 mg Intravenous Given 06/29/19 1243)  magnesium sulfate IVPB 2 g 50 mL (  2 g Intravenous New  Bag/Given 06/29/19 1254)  hydrALAZINE (APRESOLINE) injection 5 mg (5 mg Intravenous Given 06/29/19 1240)     ED Discharge Orders    None       Note:  This document was prepared using Dragon voice recognition software and may include unintentional dictation errors.   Shaune Pollack, MD 06/29/19 1655

## 2019-06-30 ENCOUNTER — Inpatient Hospital Stay: Payer: Medicare Other | Admitting: Anesthesiology

## 2019-06-30 ENCOUNTER — Other Ambulatory Visit: Payer: Self-pay

## 2019-06-30 ENCOUNTER — Encounter
Admission: EM | Disposition: A | Discharge status: Home Health | Payer: Self-pay | Source: Home / Self Care | Attending: Internal Medicine

## 2019-06-30 ENCOUNTER — Encounter: Payer: Self-pay | Admitting: Internal Medicine

## 2019-06-30 DIAGNOSIS — K5900 Constipation, unspecified: Secondary | ICD-10-CM

## 2019-06-30 HISTORY — PX: COLONOSCOPY: SHX174

## 2019-06-30 HISTORY — PX: ESOPHAGOGASTRODUODENOSCOPY: SHX5428

## 2019-06-30 LAB — CBC
HCT: 36.3 % (ref 36.0–46.0)
Hemoglobin: 13.1 g/dL (ref 12.0–15.0)
MCH: 31.2 pg (ref 26.0–34.0)
MCHC: 36.1 g/dL — ABNORMAL HIGH (ref 30.0–36.0)
MCV: 86.4 fL (ref 80.0–100.0)
Platelets: 226 10*3/uL (ref 150–400)
RBC: 4.2 MIL/uL (ref 3.87–5.11)
RDW: 14.5 % (ref 11.5–15.5)
WBC: 10.3 10*3/uL (ref 4.0–10.5)
nRBC: 0 % (ref 0.0–0.2)

## 2019-06-30 LAB — BASIC METABOLIC PANEL
Anion gap: 11 (ref 5–15)
BUN: 17 mg/dL (ref 8–23)
CO2: 25 mmol/L (ref 22–32)
Calcium: 8.9 mg/dL (ref 8.9–10.3)
Chloride: 100 mmol/L (ref 98–111)
Creatinine, Ser: 0.63 mg/dL (ref 0.44–1.00)
GFR calc Af Amer: >60 mL/min (ref >60)
GFR calc non Af Amer: >60 mL/min (ref >60)
Glucose, Bld: 122 mg/dL — ABNORMAL HIGH (ref 70–99)
Potassium: 3.3 mmol/L — ABNORMAL LOW (ref 3.5–5.1)
Sodium: 136 mmol/L (ref 135–145)

## 2019-06-30 LAB — PHOSPHORUS: Phosphorus: 2.9 mg/dL (ref 2.5–4.6)

## 2019-06-30 LAB — MAGNESIUM: Magnesium: 2 mg/dL (ref 1.7–2.4)

## 2019-06-30 SURGERY — EGD (ESOPHAGOGASTRODUODENOSCOPY)
Anesthesia: General

## 2019-06-30 MED ORDER — AMLODIPINE BESYLATE 5 MG PO TABS
5.0000 mg | ORAL_TABLET | Freq: Once | ORAL | Status: AC
  Administered 2019-06-30: 5 mg via ORAL
  Filled 2019-06-30: qty 1

## 2019-06-30 MED ORDER — BISACODYL 10 MG RE SUPP
10.0000 mg | Freq: Every day | RECTAL | Status: DC
  Filled 2019-06-30: qty 1

## 2019-06-30 MED ORDER — LISINOPRIL 20 MG PO TABS
40.0000 mg | ORAL_TABLET | Freq: Every day | ORAL | Status: DC
  Administered 2019-07-01: 09:00:00 40 mg via ORAL
  Filled 2019-06-30: qty 2

## 2019-06-30 MED ORDER — LIDOCAINE HCL (PF) 2 % IJ SOLN
INTRAMUSCULAR | Status: AC
  Filled 2019-06-30: qty 5

## 2019-06-30 MED ORDER — PROPOFOL 500 MG/50ML IV EMUL
INTRAVENOUS | Status: DC | PRN
  Administered 2019-06-30: 100 ug/kg/min via INTRAVENOUS

## 2019-06-30 MED ORDER — MORPHINE SULFATE ER 30 MG PO TBCR
30.0000 mg | EXTENDED_RELEASE_TABLET | Freq: Three times a day (TID) | ORAL | Status: DC
  Administered 2019-06-30 – 2019-07-02 (×7): 30 mg via ORAL
  Filled 2019-06-30 (×7): qty 1

## 2019-06-30 MED ORDER — PROPOFOL 500 MG/50ML IV EMUL
INTRAVENOUS | Status: AC
  Filled 2019-06-30: qty 50

## 2019-06-30 MED ORDER — NYSTATIN 100000 UNIT/ML MT SUSP
5.0000 mL | Freq: Four times a day (QID) | OROMUCOSAL | Status: DC
  Administered 2019-06-30 – 2019-07-02 (×8): 500000 [IU] via ORAL
  Filled 2019-06-30 (×9): qty 5

## 2019-06-30 MED ORDER — LACTULOSE 10 GM/15ML PO SOLN
30.0000 g | Freq: Three times a day (TID) | ORAL | Status: DC
  Administered 2019-06-30 (×2): 30 g via ORAL
  Filled 2019-06-30 (×3): qty 60

## 2019-06-30 MED ORDER — LISINOPRIL 20 MG PO TABS
20.0000 mg | ORAL_TABLET | Freq: Once | ORAL | Status: AC
  Administered 2019-06-30: 17:00:00 20 mg via ORAL
  Filled 2019-06-30: qty 1

## 2019-06-30 MED ORDER — SODIUM CHLORIDE 0.9 % IV SOLN: INTRAVENOUS | Status: DC

## 2019-06-30 MED ORDER — AMLODIPINE BESYLATE 10 MG PO TABS
10.0000 mg | ORAL_TABLET | Freq: Every day | ORAL | Status: DC
  Administered 2019-07-01: 09:00:00 10 mg via ORAL
  Filled 2019-06-30 (×2): qty 1

## 2019-06-30 NOTE — Progress Notes (Signed)
Patient ID: Alison Price, female   DOB: [birth year], 74 y.o.   MRN: [id number] [facility] PROGRESS NOTE  Alison Price SEG:[id number] DOB: 10/30/1945 DOA: 06/29/2019 PCP: Jaclyn Shaggy, MD  HPI/Subjective: Patient seen this morning was not feeling well.  Had not vomited since coming up to the floor yesterday.  Still nauseous.  Still having abdominal pain.  Still has not had a bowel movement.  Objective: Vitals:   06/30/19 1324 06/30/19 1500  BP: (!) 175/86 (!) 170/77  Pulse: 80   Resp: 13   Temp: 98.6 F (37 C)   SpO2: 96%     Intake/Output Summary (Last 24 hours) at 06/30/2019 1604 Last data filed at 06/30/2019 1217 Gross per 24 hour  Intake 963.23 ml  Output 700 ml  Net 263.23 ml   Filed Weights   06/29/19 1010  Weight: 61.2 kg    ROS: Review of Systems  Constitutional: Negative for fever.  Eyes: Negative for blurred vision.  Respiratory: Negative for cough and shortness of breath.   Cardiovascular: Negative for chest pain.  Gastrointestinal: Positive for abdominal pain and constipation. Negative for diarrhea, nausea and vomiting.  Genitourinary: Negative for dysuria.  Musculoskeletal: Negative for joint pain.  Neurological: Negative for dizziness.   Exam: Physical Exam  Constitutional: She is oriented to person, place, and time.  HENT:  Nose: No mucosal edema.  Mouth/Throat: No oropharyngeal exudate or posterior oropharyngeal edema.  Eyes: Conjunctivae and lids are normal.  Neck: Carotid bruit is not present.  Cardiovascular: S1 normal and S2 normal. Exam reveals no gallop.  No murmur heard. Respiratory: No respiratory distress. She has no wheezes. She has no rhonchi. She has no rales.  GI: Soft. Bowel sounds are normal. There is no abdominal tenderness.  Musculoskeletal:     Right ankle: No swelling.     Left ankle: No swelling.  Lymphadenopathy:    She has no cervical adenopathy.  Neurological: She is alert and oriented to person, place,  and time. No cranial nerve deficit.  Skin: Skin is warm. No rash noted. Nails show no clubbing.  Psychiatric: She has a normal mood and affect.      Data Reviewed: Basic Metabolic Panel: Recent Labs  Lab 06/29/19 1023 06/30/19 0423  NA 139 136  K 2.6* 3.3*  CL 96* 100  CO2 27 25  GLUCOSE 143* 122*  BUN 20 17  CREATININE 0.55 0.63  CALCIUM 10.0 8.9  MG 1.6* 2.0  PHOS  --  2.9   Liver Function Tests: Recent Labs  Lab 06/29/19 1023  AST 28  ALT 10  ALKPHOS 91  BILITOT 1.3*  PROT 8.9*  ALBUMIN 4.9   Recent Labs  Lab 06/29/19 1023  LIPASE 78*   CBC: Recent Labs  Lab 06/29/19 1023 06/30/19 0423  WBC 12.8* 10.3  HGB 13.2 13.1  HCT 37.9 36.3  MCV 87.9 86.4  PLT 203 226     Recent Results (from the past 240 hour(s))  Urine culture     Status: Abnormal (Preliminary result)   Collection Time: 06/29/19 11:40 AM   Specimen: Urine, Clean Catch  Result Value Ref Range Status   Specimen Description   Final    URINE, CLEAN CATCH Performed at [facility], [address] [place], [place] [address]    Special Requests   Final    Normal Performed at [facility], [address] [place], [place] [address]    Culture >=100,000 COLONIES/mL ESCHERICHIA COLI (A)  Final  Report Status PENDING  Incomplete  Blood culture (routine x 2)     Status: None (Preliminary result)   Collection Time: 06/29/19 12:56 PM   Specimen: BLOOD  Result Value Ref Range Status   Specimen Description BLOOD LEFT ARM  Final   Special Requests   Final    BOTTLES DRAWN AEROBIC AND ANAEROBIC Blood Culture adequate volume   Culture   Final    NO GROWTH < 24 HOURS Performed at [facility], [address] [place], [place] [address]    Report Status PENDING  Incomplete  Blood culture (routine x 2)     Status: None (Preliminary result)   Collection Time: 06/29/19 12:56 PM   Specimen: BLOOD  Result Value Ref Range Status   Specimen Description BLOOD RIGHT [facility]   Final   Special Requests   Final    BOTTLES DRAWN AEROBIC AND ANAEROBIC Blood Culture adequate volume   Culture   Final    NO GROWTH < 24 HOURS Performed at [facility], [address], [place], [place] [address]    Report Status PENDING  Incomplete  SARS Coronavirus 2 by RT PCR (hospital order, performed in [facility] hospital lab) Nasopharyngeal Nasopharyngeal Swab     Status: None   Collection Time: 06/29/19 12:56 PM   Specimen: Nasopharyngeal Swab  Result Value Ref Range Status   SARS Coronavirus 2 NEGATIVE NEGATIVE Final    Comment: (NOTE) SARS-CoV-2 target nucleic acids are NOT DETECTED. The SARS-CoV-2 RNA is generally detectable in upper and lower respiratory specimens during the acute phase of infection. The lowest concentration of SARS-CoV-2 viral copies this assay can detect is 250 copies / mL. A negative result does not preclude SARS-CoV-2 infection and should not be used as the sole basis for treatment or other patient management decisions.  A negative result may occur with improper specimen collection / handling, submission of specimen other than nasopharyngeal swab, presence of viral mutation(s) within the areas targeted by this assay, and inadequate number of viral copies (<250 copies / mL). A negative result must be combined with clinical observations, patient history, and epidemiological information. Fact Sheet for Patients:   BoilerBrush.com.cy Fact Sheet for Healthcare Providers: https://pope.com/ This test is not yet approved or cleared  by the [place] FDA and has been authorized for detection and/or diagnosis of SARS-CoV-2 by FDA under an Emergency Use Authorization (EUA).  This EUA will remain in effect (meaning this test can be used) for the duration of the COVID-19 declaration under Section 564(b)(1) of the Act, 21 U.S.C. section 360bbb-3(b)(1), unless the authorization is terminated  or revoked sooner. Performed at [facility], [address] [place], [place] [address]      Studies: CT ABDOMEN PELVIS W CONTRAST  Result Date: 06/29/2019 CLINICAL DATA:  Pain for 4 days, last normal bowel movement 2 weeks ago, history of constipation without relief from over-the-counter medications, hypertension, GERD, history Stevens-Johnson syndrome, former smoker EXAM: CT ABDOMEN AND PELVIS WITH CONTRAST TECHNIQUE: Multidetector CT imaging of the abdomen and pelvis was performed using the standard protocol following bolus administration of intravenous contrast. Sagittal and coronal MPR images reconstructed from axial data set. CONTRAST:  OMNIPAQUE IOHEXOL 300 MG/ML SOLN IV. No oral contrast. COMPARISON:  06/10/2018 FINDINGS: Lower chest: Chronic bibasilar pulmonary infiltrates, slightly increased. No pleural effusion. Hepatobiliary: Gallbladder and liver normal appearance Pancreas: Normal appearance Spleen: Normal appearance Adrenals/Urinary Tract: Adrenal glands normal appearance. BILATERAL renal cysts. BILATERAL renal calculi, largest 10 x 7 mm  at inferior pole RIGHT kidney and 20 x 12 mm inferior pole LEFT kidney. No solid renal mass, hydronephrosis or hydroureter. Bladder unremarkable. Stomach/Bowel: Colon interposition between liver and diaphragm, including ileocecal valve. Appendix not definitely localized but no pericecal inflammatory changes are seen to suggest acute appendicitis. Wall thickening of rectum and probably sigmoid colon raising question of distal colitis. Proximal colon and small bowel loops unremarkable. Normal retained stool burden. Thickening of the distal gastric antrum with a small focal gas collection anteriorly at the distal antrum, question ulcer versus gastritis versus gastric antral tumor. Proximal stomach unremarkable. Vascular/Lymphatic: Atherosclerotic calcifications aorta, iliac arteries, coronary arteries. Aorta normal caliber. No definite  adenopathy. Reproductive: Uterus surgically absent. Nonvisualization of ovaries. Other: No free air or free fluid. No hernia. Musculoskeletal: Osseous demineralization with prior L4-L5 Ray cage fusion and old superior endplate deformities of T11, L1 and L3. LEFT psoas atrophy. IMPRESSION: Thickening of the distal gastric antrum with a small focal gas collection anteriorly at the distal antrum, question ulcer versus gastritis versus gastric antral tumor; recommend correlation with upper endoscopy. Wall thickening of the rectum and probably sigmoid colon raising question of distal colitis, most likely inflammatory bowel disease versus infection, ischemia considered a much less likely possibility. BILATERAL nonobstructing renal calculi and renal cysts. Chronic bibasilar pulmonary infiltrates, slightly increased Aortic Atherosclerosis (ICD10-I70.0). Electronically Signed   By: Lavonia Dana M.D.   On: 06/29/2019 11:46    Scheduled Meds: . [START ON 07/01/2019] amLODipine  10 mg Oral Daily  . escitalopram  5 mg Oral Daily  . fluticasone  2 spray Each Nare Daily  . lactulose  30 g Oral TID  . lisinopril  20 mg Oral Once  . [START ON 07/01/2019] lisinopril  40 mg Oral Daily  . loratadine  10 mg Oral Daily  . methylnaltrexone  4 mg Subcutaneous Once  . mometasone-formoterol  2 puff Inhalation BID  . morphine  30 mg Oral TID  . nystatin  5 mL Oral QID  . pantoprazole (PROTONIX) IV  40 mg Intravenous Q12H  . venlafaxine  75 mg Oral Daily   Continuous Infusions: . 0.9 % NaCl with KCl 20 mEq / L 75 mL/hr at 06/30/19 0012  . piperacillin-tazobactam (ZOSYN)  IV 3.375 g (06/30/19 1331)    Assessment/Plan:  1. Abdominal pain with nausea vomiting and constipation.  As needed IV pain medications and nausea medication.  Endoscopy done today but reports still not in the computer yet.  On empiric IV Protonix.  Continue IV fluids. Patient agreeable to decreasing the dose of her MS Contin.  I did give Relistor  yesterday but no response.  We will give lactulose 3 times a day until bowel movement. 2. Abnormal CT scan of the abdomen pelvis which could be a gastric ulcer.  Patient empirically on IV Protonix.  Endoscopy done today but results not in the computer yet. 3. Hypomagnesemia and hypokalemia.  Replacing potassium and IV fluids.  Magnesium replaced already. 4. Chronic pain.  Decrease her MS Contin to 30 mg 3 times daily instead of the 60 mg. 5. GERD on IV PPI 6. Impaired fasting glucose.  Patient not a diabetic hemoglobin A1c 5.0 7. Accelerated hypertension on presentation.  I had to increase her Norvasc to 10 mg daily and now I am increasing her lisinopril to 40 mg daily 8. Acute cystitis with E. coli on Zosyn     Code Status:     Code Status Orders  (From admission, onward)  Start     Ordered   06/29/19 1252  Full code  Continuous     06/29/19 1251        Code Status History    Date Active Date Inactive Code Status Order ID Comments User Context   06/10/2018 0643 06/12/2018 1816 Full Code [id number]  [facility] Care Vernetta Honey, MD ED   08/05/2017 2245 08/10/2017 1553 Full Code [id number]  Cammy Copa, MD Inpatient   10/24/2016 0110 10/25/2016 1807 Full Code [id number]  Oralia Manis, MD Inpatient   Advance Care Planning Activity     Family Communication: Spoke with daughter on the phone Disposition Plan: Status is: Inpatient  Dispo: The patient is from: Home              Anticipated d/c is to: Home              Anticipated d/c date is: 07/01/2019 versus 07/02/2019              Patient currently requires inpatient hospitalization secondary to abdominal pain nausea vomiting and constipation.  Patient still has not had a bowel movement.  I will start lactulose 3 times a day until bowel movement.  No response with Relistor yesterday.  Patient will need to tolerate solid food also prior to disposition  Consultants:  Gastroenterology  Procedures:  Endoscopy  Antibiotics:  Zosyn  Time  spent: 28 minutes  Alison Price [employer]

## 2019-06-30 NOTE — Anesthesia Procedure Notes (Signed)
Performed by: Cook-Martin, Symir Mah Pre-anesthesia Checklist: Patient identified, Emergency Drugs available, Suction available, Patient being monitored and Timeout performed Oxygen Delivery Method: Nasal cannula Preoxygenation: Pre-oxygenation with 100% oxygen Induction Type: IV induction Airway Equipment and Method: Bite block Placement Confirmation: positive ETCO2 and CO2 detector       

## 2019-06-30 NOTE — Interval H&P Note (Signed)
History and Physical Interval Note:  06/30/2019 12:04 PM  [facility]  has presented today for surgery, with the diagnosis of Nausea and vomiting, epigastric abd pain, abnormal CT - stomach.  The various methods of treatment have been discussed with the patient and family. After consideration of risks, benefits and other options for treatment, the patient has consented to  Procedure(s): ESOPHAGOGASTRODUODENOSCOPY (EGD) (N/A) as a surgical intervention.  The patient's history has been reviewed, patient examined, no change in status, stable for surgery.  I have reviewed the patient's chart and labs.  Questions were answered to the patient's satisfaction.     [place], [place]

## 2019-06-30 NOTE — Anesthesia Preprocedure Evaluation (Addendum)
Anesthesia Evaluation  Patient identified by MRN, date of birth, ID band Patient awake    Reviewed: Allergy & Precautions, H&P , NPO status , reviewed documented beta blocker date and time   Airway Mallampati: II  TM Distance: >3 FB Neck ROM: limited    Dental  (+) Poor Dentition, Chipped, Missing, Edentulous Lower   Pulmonary former smoker,    Pulmonary exam normal        Cardiovascular hypertension, Normal cardiovascular exam  PVCs on EKG    Neuro/Psych    GI/Hepatic GERD  Controlled,  Endo/Other    Renal/GU      Musculoskeletal  (+) Arthritis ,   Abdominal   Peds  Hematology  (+) Blood dyscrasia, anemia ,   Anesthesia Other Findings Past Medical History: No date: Anemia No date: Arthritis No date: Chronic pain No date: GERD (gastroesophageal reflux disease) No date: Hypertension No date: Osteoporosis No date: Stevens-Johnson syndrome (HCC)  Past Surgical History: No date: ABDOMINAL HYSTERECTOMY     Comment:  partial No date: BACK SURGERY No date: FOOT SURGERY No date: HAND SURGERY     Comment:  THUMB REPAIR No date: HEMORRHOID SURGERY 08/06/2017: INTRAMEDULLARY (IM) NAIL INTERTROCHANTERIC; Left     Comment:  Procedure: INTRAMEDULLARY (IM) NAIL INTERTROCHANTRIC;                Surgeon: Signa Kell, MD;  Location: [facility] ORS;  Service:              Orthopedics;  Laterality: Left; No date: POSTERIOR REPAIR  BMI    Body Mass Index: 21.79 kg/m      Reproductive/Obstetrics                            Anesthesia Physical Anesthesia Plan  ASA: III  Anesthesia Plan: General   Post-op Pain Management:    Induction: Intravenous  PONV Risk Score and Plan: 3 and TIVA and Treatment may vary due to age or medical condition  Airway Management Planned: Nasal Cannula and Natural Airway  Additional Equipment:   Intra-op Plan:   Post-operative Plan:   Informed Consent: I  have reviewed the patients History and Physical, chart, labs and discussed the procedure including the risks, benefits and alternatives for the proposed anesthesia with the patient or authorized representative who has indicated his/her understanding and acceptance.     Dental Advisory Given  Plan Discussed with: CRNA  Anesthesia Plan Comments:        Anesthesia Quick Evaluation

## 2019-06-30 NOTE — Transfer of Care (Signed)
Immediate Anesthesia Transfer of Care Note  Patient: Alison Price  Procedure(s) Performed: ESOPHAGOGASTRODUODENOSCOPY (EGD) (N/A )  Patient Location: PACU  Anesthesia Type:General  Level of Consciousness: awake and sedated  Airway & Oxygen Therapy: Patient Spontanous Breathing and Patient connected to nasal cannula oxygen  Post-op Assessment: Report given to RN and Post -op Vital signs reviewed and stable  Post vital signs: Reviewed and stable  Last Vitals:  Vitals Value Taken Time  BP    Temp    Pulse    Resp    SpO2      Last Pain:  Vitals:   06/30/19 1156  TempSrc: Temporal  PainSc: 7       Patients Stated Pain Goal: 0 (06/30/19 0708)  Complications: No apparent anesthesia complications

## 2019-06-30 NOTE — Progress Notes (Signed)
Initial Nutrition Assessment  DOCUMENTATION CODES:   Severe malnutrition in context of social or environmental circumstances  INTERVENTION:  Once diet is advanced provide Ensure Enlive po TID, each supplement provides 350 kcal and 20 grams of protein.  Once diet is advanced provide MVI daily.  Recommend continuing to monitor potassium, phosphorus, and magnesium daily and replacing as needed as patient is at risk for refeeding syndrome in setting of severe malnutrition.   NUTRITION DIAGNOSIS:   Severe Malnutrition related to social / environmental circumstances(inadequate oral intake) as evidenced by severe fat depletion, severe muscle depletion.  GOAL:   Patient will meet greater than or equal to 90% of their needs  MONITOR:   PO intake, Supplement acceptance, Diet advancement, Labs, Weight trends, I & O's  REASON FOR ASSESSMENT:   Malnutrition Screening Tool    ASSESSMENT:   74 year old female with PMHx of anemia, HTN, arthritis, osteoporosis, Stevens-Johnson syndrome, GERD, chronic pain, constipation admitted with abdominal pain and N/V.   -Patient s/p EGD earlier today. Reviewed paper chart as it has not yet appeared in Epic. Findings include gastritis that was biopsied. -Diet was advanced to clear liquids with a note to advance as tolerated.  Met with patient at bedside. She was very sleepy and would not awaken to name call or during Nutrition-Focused Physical Exam. Per H&P patient presented with 3 day history of N/V. Spoke with patient's daughter Dolores Hoose over the phone to obtain nutrition/weight history. She reports patient has had a poor appetite and intake for a while now. Patient lives with patient's grandson and daughter lives down the road from them but does not have a car. Daughter reports patient only eats bites/sips at meals at baselines. She does not typically eat "meals" and instead eats more snack foods such as ice cream and milk shakes. She does enjoy  Ensure, though, and daughter reports she would likely drink these once diet is able to be advanced.  Daughter reports patient's weight loss occurred many years ago. She is currently documented to be 61.2 kg (135 lbs). However, unsure if this is accurate as patient appears to weigh less than this on assessment.  Medications reviewed and include: lisinopril 20 mg daily, morphine, Protonix, NS with KCl 20 mEq/L at 75 mL/hr, Zosyn.  Labs reviewed: Potassium 3.3. Phosphorus and Magnesium WNL.  Discussed with RN.  NUTRITION - FOCUSED PHYSICAL EXAM:    Most Recent Value  Orbital Region  Severe depletion  Upper Arm Region  Severe depletion  Thoracic and Lumbar Region  Severe depletion  Buccal Region  Severe depletion  Temple Region  Severe depletion  Clavicle Bone Region  Severe depletion  Clavicle and Acromion Bone Region  Severe depletion  Scapular Bone Region  Unable to assess  Dorsal Hand  Severe depletion  Patellar Region  Severe depletion  Anterior Thigh Region  Severe depletion  Posterior Calf Region  Severe depletion  Edema (RD Assessment)  None  Hair  Reviewed  Eyes  Unable to assess  Mouth  Unable to assess  Skin  Reviewed  Nails  Reviewed     Diet Order:   Diet Order            Diet clear liquid Room service appropriate? Yes; Fluid consistency: Thin  Diet effective now             EDUCATION NEEDS:   No education needs have been identified at this time  Skin:  Skin Assessment: Reviewed RN Assessment  Last BM:  PTA  Height:   Ht Readings from Last 1 Encounters:  06/29/19 '5\' 6"'$  (1.676 m)   Weight:   Wt Readings from Last 1 Encounters:  06/29/19 61.2 kg   BMI:  Body mass index is 21.79 kg/m.  Estimated Nutritional Needs:   Kcal:  1500-1700  Protein:  75-85 grams  Fluid:  1.5 L/day  Jacklynn Barnacle, MS, RD, LDN Pager number available on Amion

## 2019-07-01 ENCOUNTER — Encounter: Payer: Self-pay | Admitting: *Deleted

## 2019-07-01 DIAGNOSIS — G8929 Other chronic pain: Secondary | ICD-10-CM

## 2019-07-01 DIAGNOSIS — B9629 Other Escherichia coli [E. coli] as the cause of diseases classified elsewhere: Secondary | ICD-10-CM

## 2019-07-01 DIAGNOSIS — R531 Weakness: Secondary | ICD-10-CM

## 2019-07-01 DIAGNOSIS — Z1612 Extended spectrum beta lactamase (ESBL) resistance: Secondary | ICD-10-CM

## 2019-07-01 DIAGNOSIS — E43 Unspecified severe protein-calorie malnutrition: Secondary | ICD-10-CM | POA: Insufficient documentation

## 2019-07-01 DIAGNOSIS — N390 Urinary tract infection, site not specified: Secondary | ICD-10-CM

## 2019-07-01 LAB — URINE CULTURE
Culture: >=100000 — AB
Special Requests: NORMAL

## 2019-07-01 LAB — SURGICAL PATHOLOGY

## 2019-07-01 MED ORDER — ENSURE ENLIVE PO LIQD
237.0000 mL | Freq: Three times a day (TID) | ORAL | Status: DC
  Administered 2019-07-01 – 2019-07-02 (×2): 237 mL via ORAL

## 2019-07-01 MED ORDER — LISINOPRIL 20 MG PO TABS
20.0000 mg | ORAL_TABLET | Freq: Every day | ORAL | Status: DC
  Administered 2019-07-02: 20 mg via ORAL
  Filled 2019-07-01: qty 1

## 2019-07-01 MED ORDER — SODIUM CHLORIDE 0.9 % IV SOLN
1.0000 g | Freq: Three times a day (TID) | INTRAVENOUS | Status: DC
  Administered 2019-07-01 – 2019-07-02 (×4): 1 g via INTRAVENOUS
  Filled 2019-07-01 (×6): qty 1

## 2019-07-01 MED ORDER — AMLODIPINE BESYLATE 5 MG PO TABS
5.0000 mg | ORAL_TABLET | Freq: Every day | ORAL | Status: DC
  Administered 2019-07-02: 5 mg via ORAL
  Filled 2019-07-01: qty 1

## 2019-07-01 NOTE — Anesthesia Postprocedure Evaluation (Signed)
Anesthesia Post Note  Patient: Alison Price  Procedure(s) Performed: ESOPHAGOGASTRODUODENOSCOPY (EGD) (N/A )  Patient location during evaluation: Endoscopy Anesthesia Type: General Level of consciousness: awake and alert Pain management: pain level controlled Vital Signs Assessment: post-procedure vital signs reviewed and stable Respiratory status: spontaneous breathing, nonlabored ventilation and respiratory function stable Cardiovascular status: blood pressure returned to baseline and stable Postop Assessment: no apparent nausea or vomiting Anesthetic complications: no     Last Vitals:  Vitals:   06/30/19 2309 07/01/19 0731  BP: (!) 157/91 (!) 161/80  Pulse: 78 86  Resp: 20 15  Temp: 36.8 C 36.6 C  SpO2: 97% 99%    Last Pain:  Vitals:   07/01/19 0731  TempSrc: Oral  PainSc:                  [name]

## 2019-07-01 NOTE — Progress Notes (Signed)
GI Inpatient Follow-up Note  Subjective:  Patient seen in follow-up for intractable nausea and vomiting with epigastric abd pain. She had EGD performed yesterday by Dr. Norma Fredrickson showing H pylori negative gastritis and lower esophageal B ring. She reports no acute overnight events. Lactulose worked well to produce 2 BMs this morning. She reports she still has some mild epigastric pain and tenderness to palpation, but feels slightly better than yesterday. She has not a appetite. PT came by but she was dizzy and lightheaded, so this was cancelled for the day. Her daughter and close friend are in the room with her.   Scheduled Inpatient Medications:  . [START ON 07/02/2019] amLODipine  5 mg Oral Daily  . bisacodyl  10 mg Rectal QHS  . escitalopram  5 mg Oral Daily  . feeding supplement (ENSURE ENLIVE)  237 mL Oral TID BM  . fluticasone  2 spray Each Nare Daily  . [START ON 07/02/2019] lisinopril  20 mg Oral Daily  . loratadine  10 mg Oral Daily  . methylnaltrexone  4 mg Subcutaneous Once  . mometasone-formoterol  2 puff Inhalation BID  . morphine  30 mg Oral TID  . nystatin  5 mL Oral QID  . pantoprazole (PROTONIX) IV  40 mg Intravenous Q12H  . venlafaxine  75 mg Oral Daily    Continuous Inpatient Infusions:   . 0.9 % NaCl with KCl 20 mEq / L 50 mL/hr at 07/01/19 0300  . meropenem (MERREM) IV 1 g (07/01/19 1430)    PRN Inpatient Medications:  acetaminophen **OR** acetaminophen, butalbital-acetaminophen-caffeine, dimenhyDRINATE, ipratropium-albuterol, labetalol, meclizine, morphine injection, ondansetron **OR** ondansetron (ZOFRAN) IV, oxyCODONE-acetaminophen, promethazine  Review of Systems: Constitutional: Weight is stable.  Eyes: No changes in vision. ENT: No oral lesions, sore throat.  GI: see HPI.  Heme/Lymph: No easy bruising.  CV: No chest pain.  GU: No hematuria.  Integumentary: No rashes.  Neuro: No headaches.  Psych: No depression/anxiety.  Endocrine: No heat/cold  intolerance.  Allergic/Immunologic: No urticaria.  Resp: No cough, SOB.  Musculoskeletal: No joint swelling.    Physical Examination: BP (!) 156/70   Pulse 86   Temp 97.8 F (36.6 C) (Oral)   Resp 15   Ht 5\' 6"  (1.676 m)   Wt 61.2 kg   SpO2 98%   BMI 21.79 kg/m  Gen: NAD, alert and oriented x 4 HEENT: PEERLA, EOMI, Neck: supple, no JVD or thyromegaly Chest: CTA bilaterally, no wheezes, crackles, or other adventitious sounds CV: RRR, no m/g/c/r Abd: soft, NT, ND, +BS in all four quadrants; no HSM, guarding, ridigity, or rebound tenderness Ext: no edema, well perfused with 2+ pulses, Skin: no rash or lesions noted Lymph: no LAD  Data: Lab Results  Component Value Date   WBC 10.3 06/30/2019   HGB 13.1 06/30/2019   HCT 36.3 06/30/2019   MCV 86.4 06/30/2019   PLT 226 06/30/2019   Recent Labs  Lab 06/29/19 1023 06/30/19 0423  HGB 13.2 13.1   Lab Results  Component Value Date   NA 136 06/30/2019   K 3.3 (L) 06/30/2019   CL 100 06/30/2019   CO2 25 06/30/2019   BUN 17 06/30/2019   CREATININE 0.63 06/30/2019   Lab Results  Component Value Date   ALT 10 06/29/2019   AST 28 06/29/2019   ALKPHOS 91 06/29/2019   BILITOT 1.3 (H) 06/29/2019   No results for input(s): APTT, INR, PTT in the last 168 hours. Assessment/Plan:  74 y/o Caucasian female with a PMH of  chronic pain syndrome on chronic narcotics, GERD, HTN, osteoporosis, and Hx of Stevens-Johnson syndrome admitted for intractable nausea and vomiting and epigastric abd pain   1. Intractable nausea and vomiting - improving  2. Epigastric abd pain - likely 2/2 H pylori negative gastritis 3. Abnormal CT scan - thickening of distal gastric antrum 4. Colitis - distal, wall thickening of recto-sigmoid colon 5. Chronic constipation - likely opoid-induced constipation 6. UTI - ESBL E coli, on antibiotics   Recommendations:  1. EGD procedure and pathology report reviewed with patient and her daughter.  2.  Continue IV Protonix for now, can switch to oral Protonix tomorrow and she should take this daily for the next 4 weeks 3. Continue antiemetics 4. Continue Lactulose for OIC. She can go home on this regimen and take 30 cc BID for OIC 5. If tolerates diet tonight and tomorrow for breakfast, anticipate discharge tomorrow 6. [facility] will see patient tomorrow  Please call with questions or concerns.    Octavia Bruckner, PA-C [facility] [phone] (Cell)

## 2019-07-01 NOTE — Care Management Important Message (Signed)
Important Message  Patient Details  Name: MORGANA ROWLEY MRN: [id number] Date of Birth: [birth year]   Medicare Important Message Given:        Eilleen Kempf, LCSW 07/01/2019, 5:38 PM

## 2019-07-01 NOTE — Evaluation (Addendum)
Physical Therapy Evaluation Patient Details Name: Alison Price MRN: [id number] DOB: 1945/03/08 Today's Date: 07/01/2019   History of Present Illness  Alison Price is a 74yoF who comes to [facility] on 5/12 c constipation, ABD pain, and emesis. PMH fall c hip fx 2019, recent Rt wrist Fx s/p fall. At baseline pt is a community AMB without device.  Clinical Impression  Pt admitted with above diagnosis. Pt currently with functional limitations due to the deficits listed below (see "PT Problem List"). Upon entry, pt in bed, awake and agreeable to participate. The pt is alert and oriented x4, pleasant, conversational, and generally a good historian. Modified independent mobility from supine to sitting to standing, but has orthostatic dizziness after ~10 seconds while up, voicing urgent need to sit and later return to supine. Orthostasis also provokes nausea in the setting of 8-9/10 ABD pain upon entry. For this reason, pt politely declines OOB to chair. Supine BP: 156/49mmHG, then BP s/p standing attempt 115/60mmHg. *Of note: patient has been having HTN issues last 24hrs c SBP in 180s-200s. Pt somewhat unsteady with attempt to stand, endorses weakness, but could likely accomplish mobility with assistive device, albeit some practice is needed due to dominant hand NWB. Functional mobility assessment demonstrates increased effort/time requirements, poor tolerance, and need for physical assistance, whereas the patient performed these at a higher level of independence PTA. Pt will benefit from skilled PT intervention to increase independence and safety with basic mobility in preparation for discharge to the venue listed below.       Follow Up Recommendations Home health PT;Supervision for mobility/OOB    Equipment Recommendations  None recommended by PT    Recommendations for Other Services       Precautions / Restrictions Precautions Precautions: Fall Precaution Comments:  orthostatic Restrictions Weight Bearing Restrictions: Yes RUE Weight Bearing: Non weight bearing      Mobility  Bed Mobility Overal bed mobility: Modified Independent                Transfers Overall transfer level: Needs assistance Equipment used: 1 person hand held assist Transfers: Sit to/from Stand Sit to Stand: Supervision         General transfer comment: begins to have concerning dizziness after 10 seconds up, asks to sit, then after sitting 1 minutes, asks to return to supine for safety concerns surrounding continued dizziness (despite resolved SBP drop)  Ambulation/Gait Ambulation/Gait assistance: (unsafe to AMB at this time)              Information systems manager Rankin (Stroke Patients Only)       Balance Overall balance assessment: History of Falls;Mild deficits observed, not formally tested                                           Pertinent Vitals/Pain Pain Assessment: 0-10 Pain Score: 9  Pain Location: ABD pain Pain Descriptors / Indicators: Aching Pain Intervention(s): Limited activity within patient's tolerance;Monitored during session    Home Living Family/patient expects to be discharged to:: Private residence Living Arrangements: Other relatives(Lives with grandson, works overnight) Available Help at Discharge: Family Type of Home: House Home Access: Stairs to enter Entrance Stairs-Rails: None Entrance Stairs-Number of Steps: 2 Home Layout: One level Home Equipment: Environmental consultant - 2 wheels;Walker - 4 wheels;Bedside commode;Shower  seat      Prior Function Level of Independence: Independent   Gait / Transfers Assistance Needed: community AMB s device, keeps a SPC in car 'just in case'  ADL's / Homemaking Assistance Needed: independent  Comments: 1 fall in 6 months (mechanical fall) with right wrist fracture; 2019 Hip fracture s/p ORIF     Hand Dominance         Extremity/Trunk Assessment   Upper Extremity Assessment Upper Extremity Assessment: Overall WFL for tasks assessed    Lower Extremity Assessment Lower Extremity Assessment: Overall WFL for tasks assessed       Communication      Cognition Arousal/Alertness: Awake/alert Behavior During Therapy: WFL for tasks assessed/performed Overall Cognitive Status: Within Functional Limits for tasks assessed                                        General Comments      Exercises     Assessment/Plan    PT Assessment Patient needs continued PT services  PT Problem List Decreased activity tolerance;Decreased balance;Decreased mobility;Cardiopulmonary status limiting activity       PT Treatment Interventions DME instruction;Balance training;Gait training;Stair training;Functional mobility training;Therapeutic activities;Therapeutic exercise;Patient/family education    PT Goals (Current goals can be found in the Care Plan section)  Acute Rehab PT Goals Patient Stated Goal: resolve ABD pain PT Goal Formulation: With patient Time For Goal Achievement: 07/15/19 Potential to Achieve Goals: Fair    Frequency Min 2X/week   Barriers to discharge        Co-evaluation               AM-PAC PT "6 Clicks" Mobility  Outcome Measure Help needed turning from your back to your side while in a flat bed without using bedrails?: None Help needed moving from lying on your back to sitting on the side of a flat bed without using bedrails?: None Help needed moving to and from a bed to a chair (including a wheelchair)?: None Help needed standing up from a chair using your arms (e.g., wheelchair or bedside chair)?: None Help needed to walk in hospital room?: Total Help needed climbing 3-5 steps with a railing? : Total 6 Click Score: 18    End of Session Equipment Utilized During Treatment: Gait belt Activity Tolerance: Treatment limited secondary to medical complications  (Comment) Patient left: in bed;with family/visitor present;with call bell/phone within reach Nurse Communication: Mobility status PT Visit Diagnosis: Unsteadiness on feet (R26.81);Difficulty in walking, not elsewhere classified (R26.2);Other symptoms and signs involving the nervous system (R29.898)    Time: 9528-4132 PT Time Calculation (min) (ACUTE ONLY): 25 min   Charges:   PT Evaluation $PT Eval Moderate Complexity: 1 Mod          1:20 PM, 07/01/19 Etta Grandchild, PT, DPT Physical Therapist - [facility]  [phone] ([place])    Veryl Winemiller C 07/01/2019, 1:16 PM

## 2019-07-01 NOTE — Consult Note (Signed)
Pharmacy Antibiotic Note  Alison Price is a 74 y.o. female admitted on 06/29/2019 with ESBL infection.    Pharmacy has been consulted for Meropenem dosing.  Plan: Meropenem 1g q8h  Height: 5\' 6"  (167.6 cm) Weight: 61.2 kg (135 lb) IBW/kg (Calculated) : 59.3  Temp (24hrs), Avg:98.3 F (36.8 C), Min:97.8 F (36.6 C), Max:98.7 F (37.1 C)  Recent Labs  Lab 06/29/19 1023 06/29/19 1140 06/29/19 1256 06/30/19 0423  WBC 12.8*  --   --  10.3  CREATININE 0.55  --   --  0.63  LATICACIDVEN  --  1.5 1.1  --     Estimated Creatinine Clearance: 58.6 mL/min (by C-G formula based on SCr of 0.63 mg/dL).    Allergies  Allergen Reactions  . Codeine Nausea And Vomiting  . Sulfa Antibiotics Other (See Comments)    STEVEN JOHNSON SYNDROME    Antimicrobials this admission: Zosyn 5/12 >> 5/14 Meropenem 5/14  Microbiology results: 5/12 BCx: NG2D 5/12 UCx: MDR ESBL  Thank you for allowing pharmacy to be a part of this patient's care.  7/12, PharmD, BCPS Clinical Pharmacist 07/01/2019 9:03 AM

## 2019-07-01 NOTE — Progress Notes (Signed)
Patient ID: Alison Price, female   DOB: 1945/04/22, 74 y.o.   MRN: [id number] [facility] PROGRESS NOTE  Alison Price:[id number] DOB: 17-Oct-1945 DOA: 06/29/2019 PCP: Jaclyn Shaggy, MD  HPI/Subjective: The patient has not eaten much since being here.  Still has a little abdominal pain.  Had quite a few bowel movements with the lactulose.  Felt lightheaded and dizzy when physical therapy came by.  Objective: Vitals:   07/01/19 0917 07/01/19 1057  BP: (!) 172/85 (!) 156/70  Pulse:  86  Resp:    Temp:    SpO2:  98%    Intake/Output Summary (Last 24 hours) at 07/01/2019 1323 Last data filed at 07/01/2019 0300 Gross per 24 hour  Intake 966.53 ml  Output 500 ml  Net 466.53 ml   Filed Weights   06/29/19 1010  Weight: 61.2 kg    ROS: Review of Systems  Constitutional: Negative for fever.  Eyes: Negative for blurred vision.  Respiratory: Negative for cough and shortness of breath.   Cardiovascular: Negative for chest pain.  Gastrointestinal: Positive for abdominal pain and diarrhea. Negative for constipation, nausea and vomiting.  Genitourinary: Negative for dysuria.  Musculoskeletal: Negative for joint pain.  Neurological: Negative for dizziness.   Exam: Physical Exam  Constitutional: She is oriented to person, place, and time.  HENT:  Nose: No mucosal edema.  Mouth/Throat: No oropharyngeal exudate or posterior oropharyngeal edema.  Eyes: Conjunctivae and lids are normal.  Neck: Carotid bruit is not present.  Cardiovascular: S1 normal and S2 normal. Exam reveals no gallop.  No murmur heard. Respiratory: No respiratory distress. She has no wheezes. She has no rhonchi. She has no rales.  GI: Soft. Bowel sounds are normal. There is abdominal tenderness in the epigastric area.  Musculoskeletal:     Right ankle: No swelling.     Left ankle: No swelling.  Lymphadenopathy:    She has no cervical adenopathy.  Neurological: She is alert and oriented to  person, place, and time. No cranial nerve deficit.  Skin: Skin is warm. No rash noted. Nails show no clubbing.  Psychiatric: She has a normal mood and affect.      Data Reviewed: Basic Metabolic Panel: Recent Labs  Lab 06/29/19 1023 06/30/19 0423  NA 139 136  K 2.6* 3.3*  CL 96* 100  CO2 27 25  GLUCOSE 143* 122*  BUN 20 17  CREATININE 0.55 0.63  CALCIUM 10.0 8.9  MG 1.6* 2.0  PHOS  --  2.9   Liver Function Tests: Recent Labs  Lab 06/29/19 1023  AST 28  ALT 10  ALKPHOS 91  BILITOT 1.3*  PROT 8.9*  ALBUMIN 4.9   Recent Labs  Lab 06/29/19 1023  LIPASE 78*   CBC: Recent Labs  Lab 06/29/19 1023 06/30/19 0423  WBC 12.8* 10.3  HGB 13.2 13.1  HCT 37.9 36.3  MCV 87.9 86.4  PLT 203 226     Recent Results (from the past 240 hour(s))  Urine culture     Status: Abnormal   Collection Time: 06/29/19 11:40 AM   Specimen: Urine, Clean Catch  Result Value Ref Range Status   Specimen Description   Final    URINE, CLEAN CATCH Performed at [facility], [address], [place], [place] [address]    Special Requests   Final    Normal Performed at [facility], [address] [place], [place] [address]    Culture (A)  Final    >=100,000 COLONIES/mL  ESCHERICHIA COLI Confirmed Extended Spectrum Beta-Lactamase Producer (ESBL).  In bloodstream infections from ESBL organisms, carbapenems are preferred over piperacillin/tazobactam. They are shown to have a lower risk of mortality.    Report Status 07/01/2019 FINAL  Final   Organism ID, Bacteria ESCHERICHIA COLI (A)  Final      Susceptibility   Escherichia coli - MIC*    AMPICILLIN >=32 RESISTANT Resistant     CEFAZOLIN >=64 RESISTANT Resistant     CEFTRIAXONE >=64 RESISTANT Resistant     CIPROFLOXACIN >=4 RESISTANT Resistant     GENTAMICIN >=16 RESISTANT Resistant     IMIPENEM <=0.25 SENSITIVE Sensitive     NITROFURANTOIN <=16 SENSITIVE Sensitive     TRIMETH/SULFA >=320 RESISTANT Resistant      AMPICILLIN/SULBACTAM >=32 RESISTANT Resistant     PIP/TAZO >=128 RESISTANT Resistant     * >=100,000 COLONIES/mL ESCHERICHIA COLI  Blood culture (routine x 2)     Status: None (Preliminary result)   Collection Time: 06/29/19 12:56 PM   Specimen: BLOOD  Result Value Ref Range Status   Specimen Description BLOOD LEFT ARM  Final   Special Requests   Final    BOTTLES DRAWN AEROBIC AND ANAEROBIC Blood Culture adequate volume   Culture   Final    NO GROWTH 2 DAYS Performed at [facility], [address] [place], [place] [address]    Report Status PENDING  Incomplete  Blood culture (routine x 2)     Status: None (Preliminary result)   Collection Time: 06/29/19 12:56 PM   Specimen: BLOOD  Result Value Ref Range Status   Specimen Description BLOOD RIGHT [facility]  Final   Special Requests   Final    BOTTLES DRAWN AEROBIC AND ANAEROBIC Blood Culture adequate volume   Culture   Final    NO GROWTH 2 DAYS Performed at [facility], [address] [place], [place] [address]    Report Status PENDING  Incomplete  SARS Coronavirus 2 by RT PCR (hospital order, performed in [facility] hospital lab) Nasopharyngeal Nasopharyngeal Swab     Status: None   Collection Time: 06/29/19 12:56 PM   Specimen: Nasopharyngeal Swab  Result Value Ref Range Status   SARS Coronavirus 2 NEGATIVE NEGATIVE Final    Comment: (NOTE) SARS-CoV-2 target nucleic acids are NOT DETECTED. The SARS-CoV-2 RNA is generally detectable in upper and lower respiratory specimens during the acute phase of infection. The lowest concentration of SARS-CoV-2 viral copies this assay can detect is 250 copies / mL. A negative result does not preclude SARS-CoV-2 infection and should not be used as the sole basis for treatment or other patient management decisions.  A negative result may occur with improper specimen collection / handling, submission of specimen other than nasopharyngeal swab, presence of viral mutation(s)  within the areas targeted by this assay, and inadequate number of viral copies (<250 copies / mL). A negative result must be combined with clinical observations, patient history, and epidemiological information. Fact Sheet for Patients:   BoilerBrush.com.cy Fact Sheet for Healthcare Providers: https://pope.com/ This test is not yet approved or cleared  by the [place] FDA and has been authorized for detection and/or diagnosis of SARS-CoV-2 by FDA under an Emergency Use Authorization (EUA).  This EUA will remain in effect (meaning this test can be used) for the duration of the COVID-19 declaration under Section 564(b)(1) of the Act, 21 U.S.C. section 360bbb-3(b)(1), unless the authorization is terminated or revoked sooner. Performed at [facility], [address] [place], [place] [address]       Scheduled Meds: . amLODipine  10 mg Oral Daily  . bisacodyl  10 mg Rectal QHS  . escitalopram  5 mg Oral Daily  . feeding supplement (ENSURE ENLIVE)  237 mL Oral TID BM  . fluticasone  2 spray Each Nare Daily  . lisinopril  40 mg Oral Daily  . loratadine  10 mg Oral Daily  . methylnaltrexone  4 mg Subcutaneous Once  . mometasone-formoterol  2 puff Inhalation BID  . morphine  30 mg Oral TID  . nystatin  5 mL Oral QID  . pantoprazole (PROTONIX) IV  40 mg Intravenous Q12H  . venlafaxine  75 mg Oral Daily   Continuous Infusions: . 0.9 % NaCl with KCl 20 mEq / L 50 mL/hr at 07/01/19 0300  . meropenem (MERREM) IV 1 g (07/01/19 1117)    Assessment/Plan:  1. Abdominal pain with nausea vomiting and constipation.  As needed IV pain medications and nausea medication.  Endoscopy yesterday shows gastritis.  On empiric IV Protonix.  Continue IV fluids.  Decreased MS Contin dose yesterday.  Patient had multiple bowel movements with lactulose.  Patient did not eat much for breakfast we will see how she does with the next few  meals. 2. Hypomagnesemia and hypokalemia.  Replacing potassium and IV fluids.  Magnesium replaced already. 3. Chronic pain.  Decrease her MS Contin to 30 mg 3 times daily instead of the 60 mg. 4. GERD on IV PPI 5. Impaired fasting glucose.  Patient not a diabetic hemoglobin A1c 5.0 6. Essential hypertension.  Patient now on higher dose Norvasc and lisinopril.  Check orthostatic vital signs since she is dizzy when standing up 7. ESBL E. coli UTI.  Switch antibiotics to meropenem.  Upon discharge I will give a dose of fosfomycin and a repeat dose as outpatient. 8. Weakness.  Physical therapy evaluation.  Also check orthostatic vital signs since dizzy when she stood up. 9. Severe protein calorie malnutrition     Code Status:     Code Status Orders  (From admission, onward)         Start     Ordered   06/29/19 1252  Full code  Continuous     06/29/19 1251        Code Status History    Date Active Date Inactive Code Status Order ID Comments User Context   06/10/2018 0643 06/12/2018 1816 Full Code [id number]  Sidney Ace Arvella Merles, MD ED   08/05/2017 2245 08/10/2017 1553 Full Code [id number]  Amelia Jo, MD Inpatient   10/24/2016 0110 10/25/2016 1807 Full Code [id number]  Lance Coon, MD Inpatient   Advance Care Planning Activity     Family Communication: Spoke with daughter on the phone Disposition Plan: Status is: Inpatient  Dispo: The patient is from: Home              Anticipated d/c is to: Home              Anticipated d/c date is: 07/02/2019 depending on how she does with physical therapy and how she tolerates diet              Patient currently requires inpatient hospitalization because she has not eaten and tolerated food yet.  See how she does over the next few meals for potential discharge tomorrow.  Need to also see how she does with physical therapy and check orthostatic vital signs.  Also with ESBL E. coli in urine  culture I changed antibiotics today to meropenem.  Upon discharge I  will give fosfomycin.  Consultants:  Gastroenterology  Procedures:  Endoscopy  Antibiotics:  Meropenem  Time spent: 28 minutes  [employer]

## 2019-07-02 LAB — BASIC METABOLIC PANEL
Anion gap: 8 (ref 5–15)
BUN: 19 mg/dL (ref 8–23)
CO2: 26 mmol/L (ref 22–32)
Calcium: 8.2 mg/dL — ABNORMAL LOW (ref 8.9–10.3)
Chloride: 103 mmol/L (ref 98–111)
Creatinine, Ser: 0.47 mg/dL (ref 0.44–1.00)
GFR calc Af Amer: >60 mL/min (ref >60)
GFR calc non Af Amer: >60 mL/min (ref >60)
Glucose, Bld: 101 mg/dL — ABNORMAL HIGH (ref 70–99)
Potassium: 3.1 mmol/L — ABNORMAL LOW (ref 3.5–5.1)
Sodium: 137 mmol/L (ref 135–145)

## 2019-07-02 MED ORDER — NYSTATIN 100000 UNIT/ML MT SUSP: 5.0000 mL | Freq: Four times a day (QID) | OROMUCOSAL | 0 refills | Status: DC

## 2019-07-02 MED ORDER — PANTOPRAZOLE SODIUM 40 MG PO TBEC
40.0000 mg | DELAYED_RELEASE_TABLET | Freq: Every day | ORAL | 0 refills | Status: DC
Start: 2019-07-02 — End: 2020-11-08

## 2019-07-02 MED ORDER — POTASSIUM CHLORIDE CRYS ER 20 MEQ PO TBCR
40.0000 meq | EXTENDED_RELEASE_TABLET | Freq: Once | ORAL | Status: AC
  Administered 2019-07-02: 40 meq via ORAL
  Filled 2019-07-02: qty 2

## 2019-07-02 MED ORDER — MORPHINE SULFATE ER 60 MG PO TBCR: 30.0000 mg | EXTENDED_RELEASE_TABLET | Freq: Three times a day (TID) | ORAL | 0 refills | Status: DC

## 2019-07-02 MED ORDER — ENSURE ENLIVE PO LIQD: 237.0000 mL | Freq: Three times a day (TID) | ORAL | 12 refills | Status: DC

## 2019-07-02 MED ORDER — LACTULOSE 20 GM/30ML PO SOLN: 20.0000 g | Freq: Every day | ORAL | 0 refills | Status: DC | PRN

## 2019-07-02 MED ORDER — FOSFOMYCIN TROMETHAMINE 3 G PO PACK
3.0000 g | PACK | Freq: Once | ORAL | Status: AC
  Administered 2019-07-02: 3 g via ORAL
  Filled 2019-07-02: qty 3

## 2019-07-02 MED ORDER — FOSFOMYCIN TROMETHAMINE 3 G PO PACK: 3.0000 g | PACK | Freq: Once | ORAL | 0 refills | Status: AC

## 2019-07-02 MED ORDER — POTASSIUM CHLORIDE ER 10 MEQ PO TBCR: 10.0000 meq | EXTENDED_RELEASE_TABLET | Freq: Every day | ORAL | 0 refills | Status: DC

## 2019-07-02 NOTE — TOC Transition Note (Addendum)
Transition of Care [facility]) - CM/SW Discharge Note   Patient Details  Name: Alison Price MRN: [id number] Date of Birth: October 01, 1945  [facility] CM/SW Contact:  Maud Deed, LCSW Phone Number: 07/02/2019, 2:33 PM   Clinical Narrative:    Pt will be follow by [facility] for [facility] PT with start of care date 5/18. MD [place] notified and  agreed to start of care date 5/18.   Final next level of care: Home w Home Health Services Barriers to Discharge: No Barriers Identified   Patient Goals and CMS Choice        Discharge Placement                Patient to be transferred to facility by: Daughter Name of family member notified: Magda Paganini Patient and family notified of of transfer: 07/02/19  Discharge Plan and Services                          HH Arranged: PT [facility] Agency: [facility] Date HH Agency Contacted: 07/02/19 Time HH Agency Contacted: 1431 Representative spoke with at [facility] Agency: Barbara Cower  Social Determinants of Health (SDOH) Interventions     Readmission Risk Interventions No flowsheet data found.

## 2019-07-02 NOTE — Discharge Summary (Signed)
[facility] [place] [facility]   PATIENT NAME: Alison Price    MR#:  [id number]  DATE OF BIRTH:  [birth year]  DATE OF ADMISSION:  06/29/2019 ADMITTING PHYSICIAN: Alford Highland, MD  DATE OF DISCHARGE: 07/02/2019  2:22 PM  PRIMARY CARE PHYSICIAN: Jaclyn Shaggy, MD    ADMISSION DIAGNOSIS:  Dehydration [E86.0] Hypokalemia [E87.6] Colitis [K52.9] Abdominal pain [R10.9]  DISCHARGE DIAGNOSIS:  Active Problems:   Nausea and vomiting   Essential hypertension   Abdominal pain   Constipation   Protein-calorie malnutrition, severe   Other chronic pain   Urinary tract infection due to extended-spectrum beta lactamase (ESBL) producing Escherichia coli   Weakness   SECONDARY DIAGNOSIS:   Past Medical History:  Diagnosis Date  . Anemia   . Arthritis   . Chronic pain   . GERD (gastroesophageal reflux disease)   . Hypertension   . Osteoporosis   . Stevens-Johnson syndrome (HCC)     HOSPITAL COURSE:   1. Abdominal pain with nausea vomiting and constipation. The patient received IV pain medications and nausea medications. Patient had an upper endoscopy that showed gastritis. Patient was placed on IV Protonix. We gave IV fluids during the entire hospital course. Patient agreeable to decrease her MS Contin dose to 15 mg 3 times daily. Continue to taper that as outpatient with medical doctor. Patient had multiple bowel movements with lactulose. She is starting to eat better. She is feeling well enough to go home. Oral Protonix ordered. As needed lactulose upon going home. 2. Hypomagnesemia and hypokalemia. I been replacing potassium and IV fluids. As she needs a better diet this should improve. Continue oral potassium for few days upon going home. Magnesium replaced during the hospital course. 3. Chronic pain. Decrease her MS Contin dose to 30 mg 3 times a day instead of 60 mg. 4. GERD on IV PPI during the hospital course and will switch over to daily Protonix  upon going home 5. Impaired fasting glucose. Patient is not a diabetic hemoglobin A1c is 5.0. 6. Essential hypertension. Blood pressure very elevated upon presentation likely because she was unable to take her medications. I had to titrate up her medications but then she was orthostatic so I brought him back down to her usual dose. Must check orthostatic vital signs prior to titrating any blood pressure medications on this patient. 7. ESBL E. coli UTI. I switch her antibiotics over to meropenem. I gave a dose of fosfomycin prior to discharge. She can have another dose of fosfomycin on Monday or Tuesday whenever her pharmacy fills that prescription. 8. Weakness. Physical therapy recommended home health. Patient was orthostatic when she stood up yesterday so I had a cut back on her blood pressure medications. Ordered TED hose. Advised sitting at the edge of the bed before for 5 minutes prior to walking. She must continue to ambulate. 9. Severe protein calorie malnutrition. Ensure prescribed  DISCHARGE CONDITIONS:   Fair  CONSULTS OBTAINED:  Treatment Team:  Stanton Kidney, MD  DRUG ALLERGIES:   Allergies  Allergen Reactions  . Codeine Nausea And Vomiting  . Sulfa Antibiotics Other (See Comments)    STEVEN JOHNSON SYNDROME    DISCHARGE MEDICATIONS:   Allergies as of 07/02/2019      Reactions   Codeine Nausea And Vomiting   Sulfa Antibiotics Other (See Comments)   STEVEN JOHNSON SYNDROME      Medication List    STOP taking these medications   aspirin EC 81 MG tablet  TAKE these medications   alum & mag hydroxide-simeth 400-400-40 MG/5ML suspension Commonly known as: MAALOX PLUS Take 30 mLs by mouth every 4 (four) hours as needed for indigestion.   amLODipine 5 MG tablet Commonly known as: NORVASC Take 5 mg by mouth daily.   butalbital-acetaminophen-caffeine 50-325-40 MG tablet Commonly known as: FIORICET Take 1-2 tablets by mouth every 6 (six) hours as needed for  headache or migraine.   dimenhyDRINATE 50 MG tablet Commonly known as: DRAMAMINE Take 50 mg by mouth every 8 (eight) hours as needed for nausea.   escitalopram 5 MG tablet Commonly known as: LEXAPRO Take 5 mg by mouth daily.   feeding supplement (ENSURE ENLIVE) Liqd Take 237 mLs by mouth 3 (three) times daily between meals.   Fenofibric Acid 135 MG Cpdr Take 135 mg by mouth daily.   ferrous sulfate 325 (65 FE) MG tablet Take 325 mg by mouth 3 (three) times daily with meals.   fexofenadine 180 MG tablet Commonly known as: ALLEGRA Take 180 mg by mouth daily.   fluticasone 50 MCG/ACT nasal spray Commonly known as: FLONASE Place 2 sprays into both nostrils daily as needed for allergies or rhinitis.   fosfomycin 3 g Pack Commonly known as: MONUROL Take 3 g by mouth once for 1 dose. Start taking on: Jul 04, 2019   Lactulose 20 GM/30ML Soln Take 30 mLs (20 g total) by mouth daily as needed (constipation).   lisinopril 20 MG tablet Commonly known as: ZESTRIL Take 20 mg by mouth daily.   meclizine 25 MG tablet Commonly known as: ANTIVERT Take 25 mg by mouth 2 (two) times daily as needed for dizziness.   morphine 60 MG 12 hr tablet Commonly known as: MS CONTIN Take 1 tablet (60 mg total) by mouth 3 (three) times daily.   multivitamin tablet Take 1 tablet by mouth daily.   nystatin 100000 UNIT/ML suspension Commonly known as: MYCOSTATIN Take 5 mLs (500,000 Units total) by mouth 4 (four) times daily.   Oxycodone HCl 10 MG Tabs Take 10 mg by mouth 3 (three) times daily.   pantoprazole 40 MG tablet Commonly known as: Protonix Take 1 tablet (40 mg total) by mouth daily.   polyethylene glycol 17 g packet Commonly known as: MIRALAX / GLYCOLAX Take 17 g by mouth daily.   potassium chloride 10 MEQ tablet Commonly known as: KLOR-CON Take 1 tablet (10 mEq total) by mouth daily for 7 days.   senna-docusate 8.6-50 MG tablet Commonly known as: Senokot-S Take 1 tablet by  mouth 2 (two) times daily.   vitamin C 500 MG tablet Commonly known as: ASCORBIC ACID Take 500 mg by mouth 3 (three) times daily. anemia- give with ferrous sulfate to potentiate iron absorption   Vitamin D 50 MCG (2000 UT) Caps Take 6,000 Units by mouth daily.        DISCHARGE INSTRUCTIONS:   Follow-up PMD 5 days  If you experience worsening of your admission symptoms, develop shortness of breath, life threatening emergency, suicidal or homicidal thoughts you must seek medical attention immediately by calling 911 or calling your MD immediately  if symptoms less severe.  You Must read complete instructions/literature along with all the possible adverse reactions/side effects for all the Medicines you take and that have been prescribed to you. Take any new Medicines after you have completely understood and accept all the possible adverse reactions/side effects.   Please note  You were cared for by a hospitalist during your hospital stay. If you have  any questions about your discharge medications or the care you received while you were in the hospital after you are discharged, you can call the unit and asked to speak with the hospitalist on call if the hospitalist that took care of you is not available. Once you are discharged, your primary care physician will handle any further medical issues. Please note that NO REFILLS for any discharge medications will be authorized once you are discharged, as it is imperative that you return to your primary care physician (or establish a relationship with a primary care physician if you do not have one) for your aftercare needs so that they can reassess your need for medications and monitor your lab values.    Today   CHIEF COMPLAINT:   Chief Complaint  Patient presents with  . Emesis  . Constipation    HISTORY OF PRESENT ILLNESS:  Alison Price  is a 74 y.o. female came in with abdominal pain nausea vomiting and constipation   VITAL  SIGNS:  Blood pressure (!) 133/54, pulse 83, temperature 98.5 F (36.9 C), temperature source Oral, resp. rate 16, height 5\' 6"  (1.676 m), weight 61.2 kg, SpO2 100 %.  I/O:    Intake/Output Summary (Last 24 hours) at 07/02/2019 1514 Last data filed at 07/02/2019 0940 Gross per 24 hour  Intake 440 ml  Output 121 ml  Net 319 ml    PHYSICAL EXAMINATION:  GENERAL:  74 y.o.-year-old patient lying in the bed with no acute distress.  EYES: Pupils equal, round, reactive to light and accommodation. No scleral icterus. Extraocular muscles intact.  HEENT: Head atraumatic, normocephalic. Oropharynx and nasopharynx clear.  NECK:  Supple, no jugular venous distention. No thyroid enlargement, no tenderness.  LUNGS: Normal breath sounds bilaterally, no wheezing, rales,rhonchi or crepitation. No use of accessory muscles of respiration.  CARDIOVASCULAR: S1, S2 normal. No murmurs, rubs, or gallops.  ABDOMEN: Soft, non-tender, non-distended. Bowel sounds present. No organomegaly or mass.  EXTREMITIES: No pedal edema, cyanosis, or clubbing.  NEUROLOGIC: Cranial nerves II through XII are intact. Muscle strength 5/5 in all extremities. Sensation intact. Gait not checked.  PSYCHIATRIC: The patient is alert and oriented x 3.  SKIN: No obvious rash, lesion, or ulcer.   DATA REVIEW:   CBC Recent Labs  Lab 06/30/19 0423  WBC 10.3  HGB 13.1  HCT 36.3  PLT 226    Chemistries  Recent Labs  Lab 06/29/19 1023 06/29/19 1023 06/30/19 0423 06/30/19 0423 07/02/19 0708  NA 139   < > 136   < > 137  K 2.6*   < > 3.3*   < > 3.1*  CL 96*   < > 100   < > 103  CO2 27   < > 25   < > 26  GLUCOSE 143*   < > 122*   < > 101*  BUN 20   < > 17   < > 19  CREATININE 0.55   < > 0.63   < > 0.47  CALCIUM 10.0   < > 8.9   < > 8.2*  MG 1.6*   < > 2.0  --   --   AST 28  --   --   --   --   ALT 10  --   --   --   --   ALKPHOS 91  --   --   --   --   BILITOT 1.3*  --   --   --   --    < > =  values in this interval not  displayed.     Microbiology Results  Results for orders placed or performed during the hospital encounter of 06/29/19  Urine culture     Status: Abnormal   Collection Time: 06/29/19 11:40 AM   Specimen: Urine, Clean Catch  Result Value Ref Range Status   Specimen Description   Final    URINE, CLEAN CATCH Performed at [facility], [address] [place], [place] [address]    Special Requests   Final    Normal Performed at [facility], [address] [place], [place] [address]    Culture (A)  Final    >=100,000 COLONIES/mL ESCHERICHIA COLI Confirmed Extended Spectrum Beta-Lactamase Producer (ESBL).  In bloodstream infections from ESBL organisms, carbapenems are preferred over piperacillin/tazobactam. They are shown to have a lower risk of mortality.    Report Status 07/01/2019 FINAL  Final   Organism ID, Bacteria ESCHERICHIA COLI (A)  Final      Susceptibility   Escherichia coli - MIC*    AMPICILLIN >=32 RESISTANT Resistant     CEFAZOLIN >=64 RESISTANT Resistant     CEFTRIAXONE >=64 RESISTANT Resistant     CIPROFLOXACIN >=4 RESISTANT Resistant     GENTAMICIN >=16 RESISTANT Resistant     IMIPENEM <=0.25 SENSITIVE Sensitive     NITROFURANTOIN <=16 SENSITIVE Sensitive     TRIMETH/SULFA >=320 RESISTANT Resistant     AMPICILLIN/SULBACTAM >=32 RESISTANT Resistant     PIP/TAZO >=128 RESISTANT Resistant     * >=100,000 COLONIES/mL ESCHERICHIA COLI  Blood culture (routine x 2)     Status: None (Preliminary result)   Collection Time: 06/29/19 12:56 PM   Specimen: BLOOD  Result Value Ref Range Status   Specimen Description BLOOD LEFT ARM  Final   Special Requests   Final    BOTTLES DRAWN AEROBIC AND ANAEROBIC Blood Culture adequate volume   Culture   Final    NO GROWTH 3 DAYS Performed at [facility], [address] [place], [place] [address]    Report Status PENDING  Incomplete  Blood culture (routine x 2)     Status: None (Preliminary result)    Collection Time: 06/29/19 12:56 PM   Specimen: BLOOD  Result Value Ref Range Status   Specimen Description BLOOD RIGHT [facility]  Final   Special Requests   Final    BOTTLES DRAWN AEROBIC AND ANAEROBIC Blood Culture adequate volume   Culture   Final    NO GROWTH 3 DAYS Performed at [facility], [address] [place], [place] [address]    Report Status PENDING  Incomplete  SARS Coronavirus 2 by RT PCR (hospital order, performed in [facility] hospital lab) Nasopharyngeal Nasopharyngeal Swab     Status: None   Collection Time: 06/29/19 12:56 PM   Specimen: Nasopharyngeal Swab  Result Value Ref Range Status   SARS Coronavirus 2 NEGATIVE NEGATIVE Final    Comment: (NOTE) SARS-CoV-2 target nucleic acids are NOT DETECTED. The SARS-CoV-2 RNA is generally detectable in upper and lower respiratory specimens during the acute phase of infection. The lowest concentration of SARS-CoV-2 viral copies this assay can detect is 250 copies / mL. A negative result does not preclude SARS-CoV-2 infection and should not be used as the sole basis for treatment or other patient management decisions.  A negative result may occur with improper specimen collection / handling, submission of specimen other than nasopharyngeal swab, presence of viral mutation(s) within the areas targeted by this assay, and  inadequate number of viral copies (<250 copies / mL). A negative result must be combined with clinical observations, patient history, and epidemiological information. Fact Sheet for Patients:   StrictlyIdeas.no Fact Sheet for Healthcare Providers: BankingDealers.co.za This test is not yet approved or cleared  by the [place] FDA and has been authorized for detection and/or diagnosis of SARS-CoV-2 by FDA under an Emergency Use Authorization (EUA).  This EUA will remain in effect (meaning this test can be used) for the duration of the COVID-19  declaration under Section 564(b)(1) of the Act, 21 U.S.C. section 360bbb-3(b)(1), unless the authorization is terminated or revoked sooner. Performed at [facility], [address] [place], [place] [address]       Management plans discussed with the patient, family and they are in agreement.  CODE STATUS:     Code Status Orders  (From admission, onward)         Start     Ordered   06/29/19 1252  Full code  Continuous     06/29/19 1251        Code Status History    Date Active Date Inactive Code Status Order ID Comments User Context   06/10/2018 0643 06/12/2018 1816 Full Code [id number]  Sidney Ace Arvella Merles, MD ED   08/05/2017 2245 08/10/2017 1553 Full Code [id number]  Amelia Jo, MD Inpatient   10/24/2016 0110 10/25/2016 1807 Full Code [id number]  Lance Coon, MD Inpatient   Advance Care Planning Activity      TOTAL TIME TAKING CARE OF THIS PATIENT: 35 minutes.    Loletha Grayer M.D on 07/02/2019 at 3:14 PM  Between 7am to 6pm - Pager - [phone]  After 6pm go to [email or web address] - password [email or web address]  [facility]  CC: Primary care physician; Albina Billet, MD

## 2019-07-04 LAB — CULTURE, BLOOD (ROUTINE X 2)
Culture: NO GROWTH
Culture: NO GROWTH
Special Requests: ADEQUATE
Special Requests: ADEQUATE

## 2019-10-28 IMAGING — CR DG HIP (WITH OR WITHOUT PELVIS) 2-3V*L*
4 series · 4 of 4 positions shown · non-contrast
Comparison: None.

CLINICAL DATA: Fell in store.  Suspect hip fracture.

EXAM:
DG HIP (WITH OR WITHOUT PELVIS) 2-3V LEFT

[hip ap]
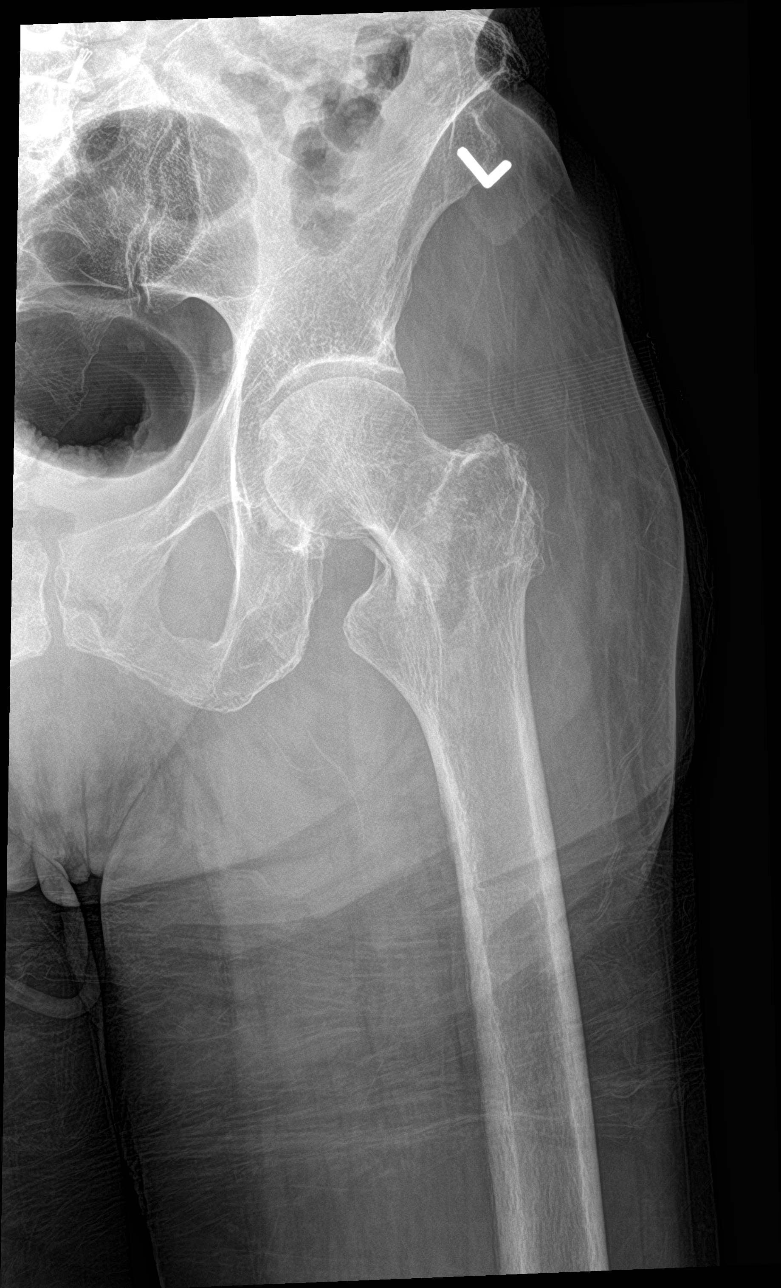

[hip lat (1 of 2)]
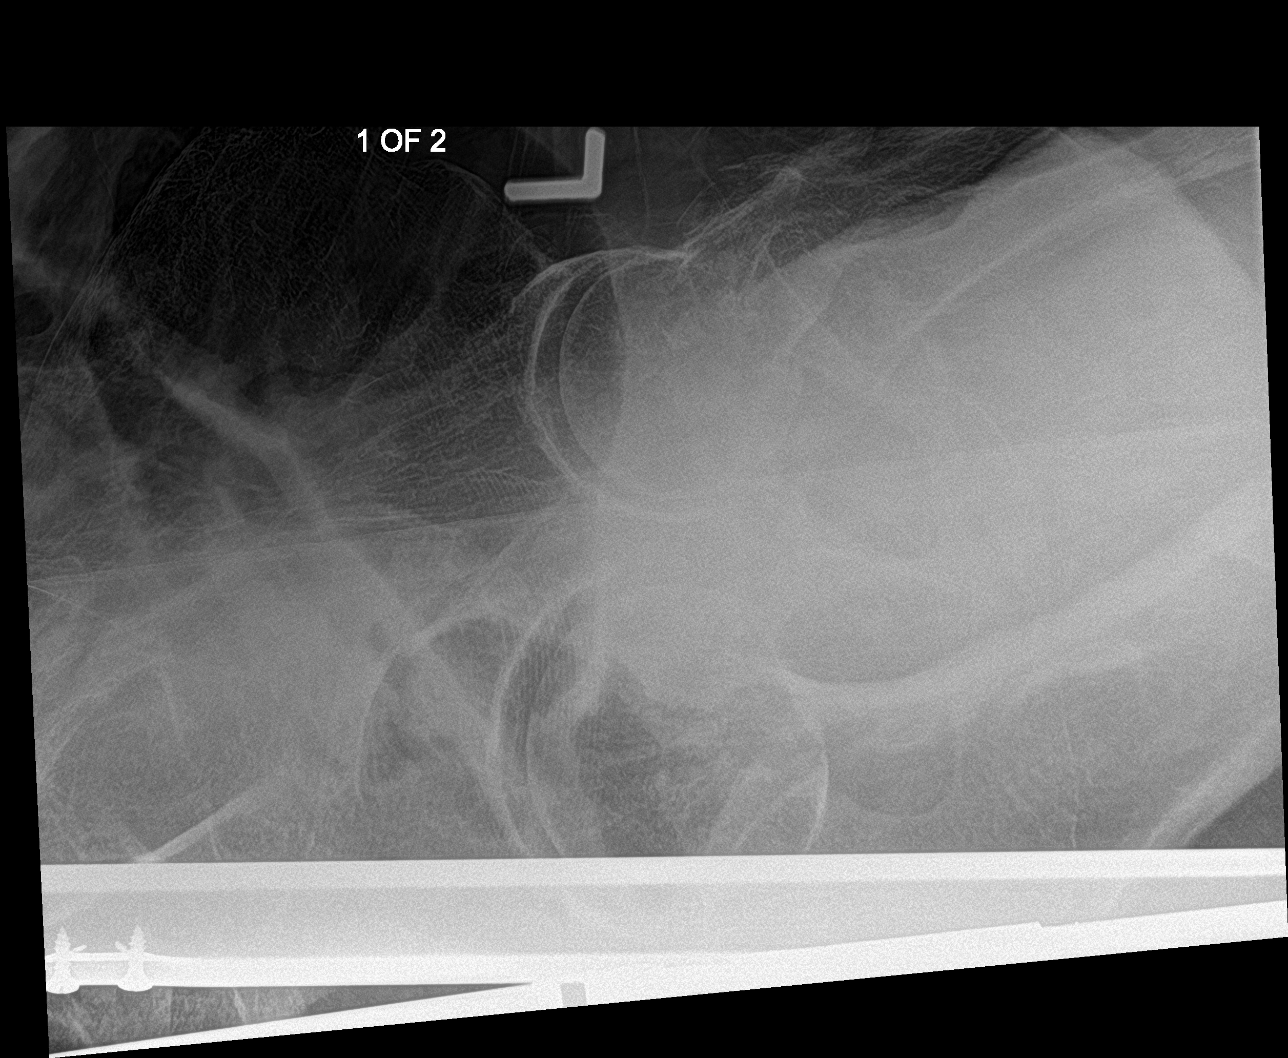

[hip lat (2 of 2)]
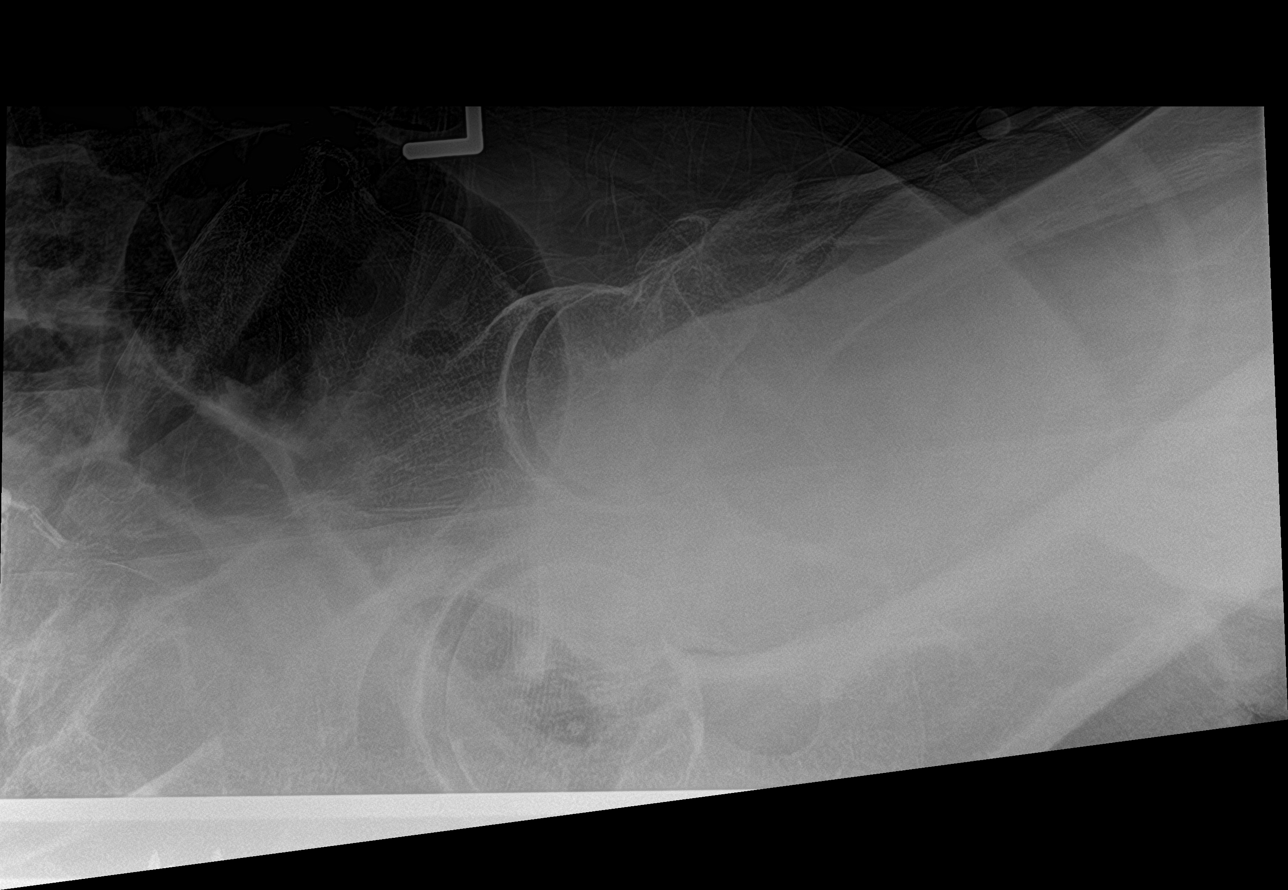

[pelvis ap]
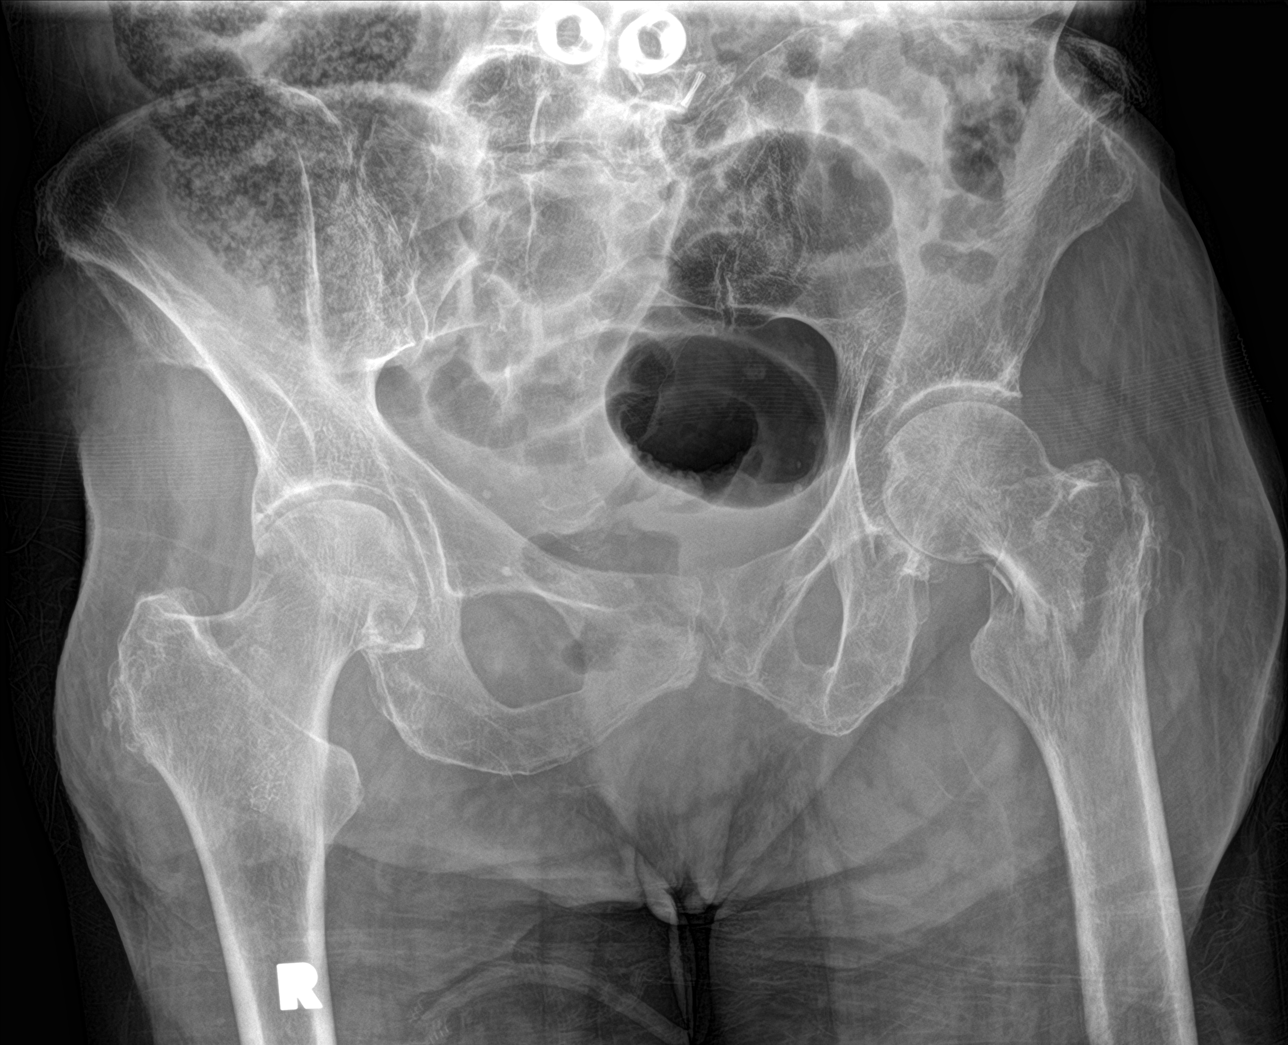

[4 of 4 positions shown; findings below may reference images not displayed]

FINDINGS: Acute nondisplaced LEFT femur femoral neck to greater trochanter. No
dislocation. No destructive bony lesions. Humeral heads are located.
Osteopenia. Soft tissue planes are non suspicious. Lower lumbar disc
prosthesis.
IMPRESSION: Acute nondisplaced LEFT femur fracture extending through the greater
trochanter.

## 2019-10-29 IMAGING — XA DG HIP (WITH PELVIS) OPERATIVE*L*
7 series · 7 of 7 positions shown · non-contrast
Comparison: Pelvis on left hip films of 08/05/2017

CLINICAL DATA: IM nailing of left hip fracture

EXAM:
OPERATIVE left HIP (WITH PELVIS IF PERFORMED) 7 VIEWS
TECHNIQUE: Fluoroscopic spot image(s) were submitted for interpretation
post-operatively.

[Series 1: cont. · 1 of 1 slices shown (1 of 7)]
[im 1/1]
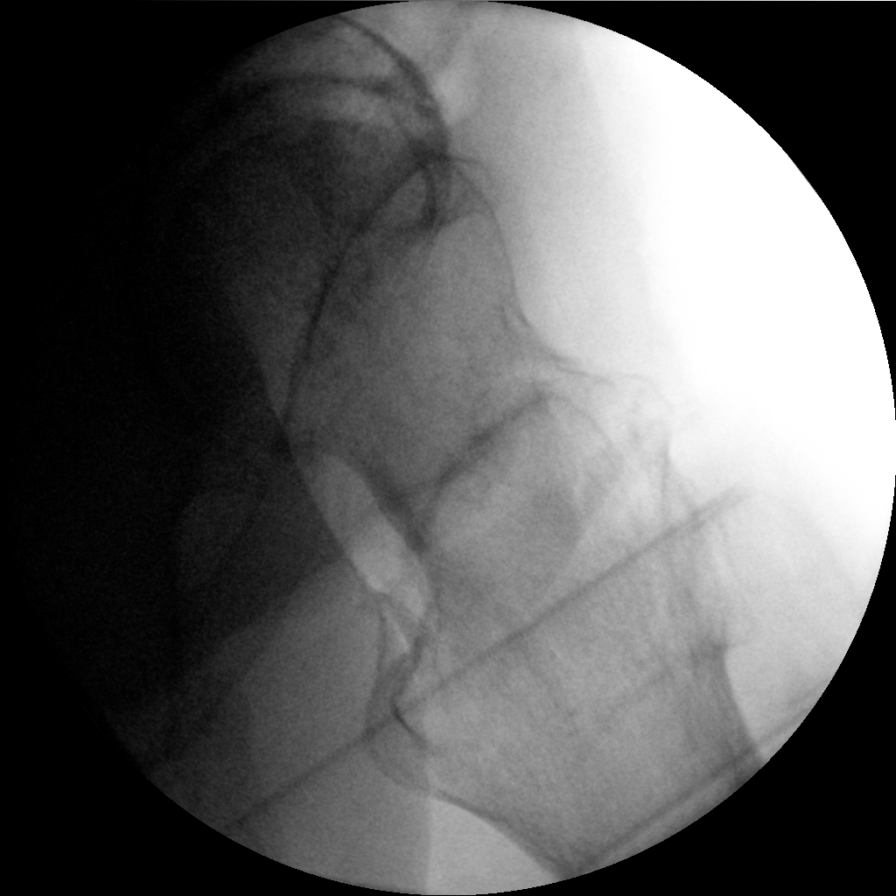

[Series 2: cont. · 1 of 1 slices shown (2 of 7)]
[im 1/1]
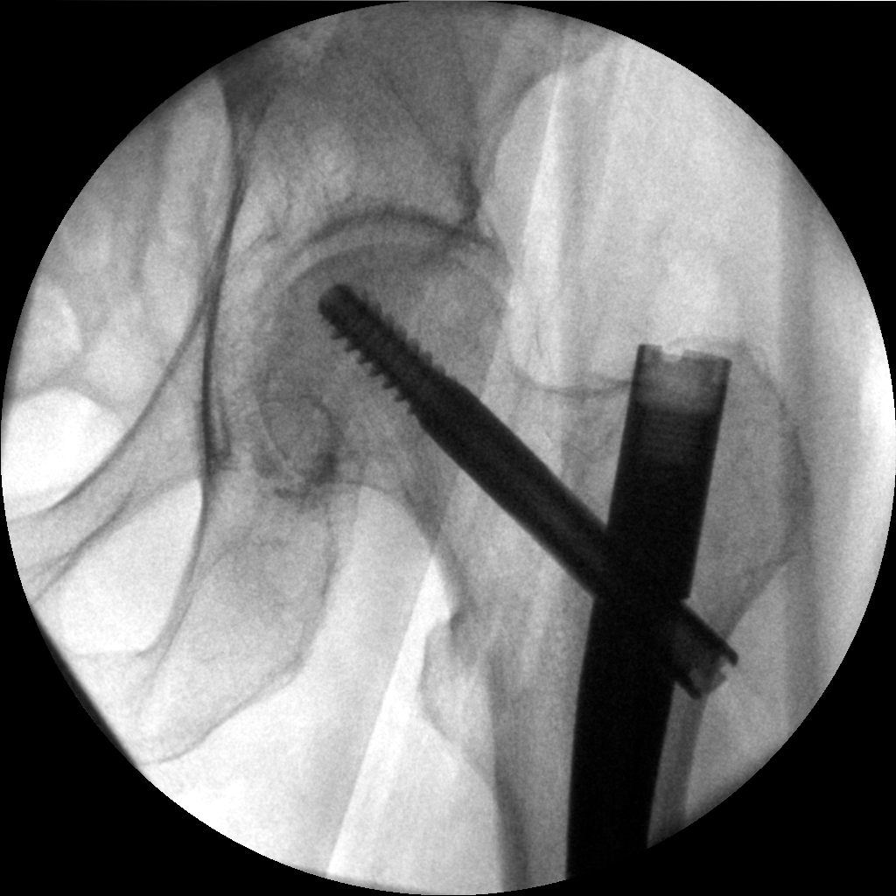

[Series 3: cont. · 1 of 1 slices shown (3 of 7)]
[im 1/1]
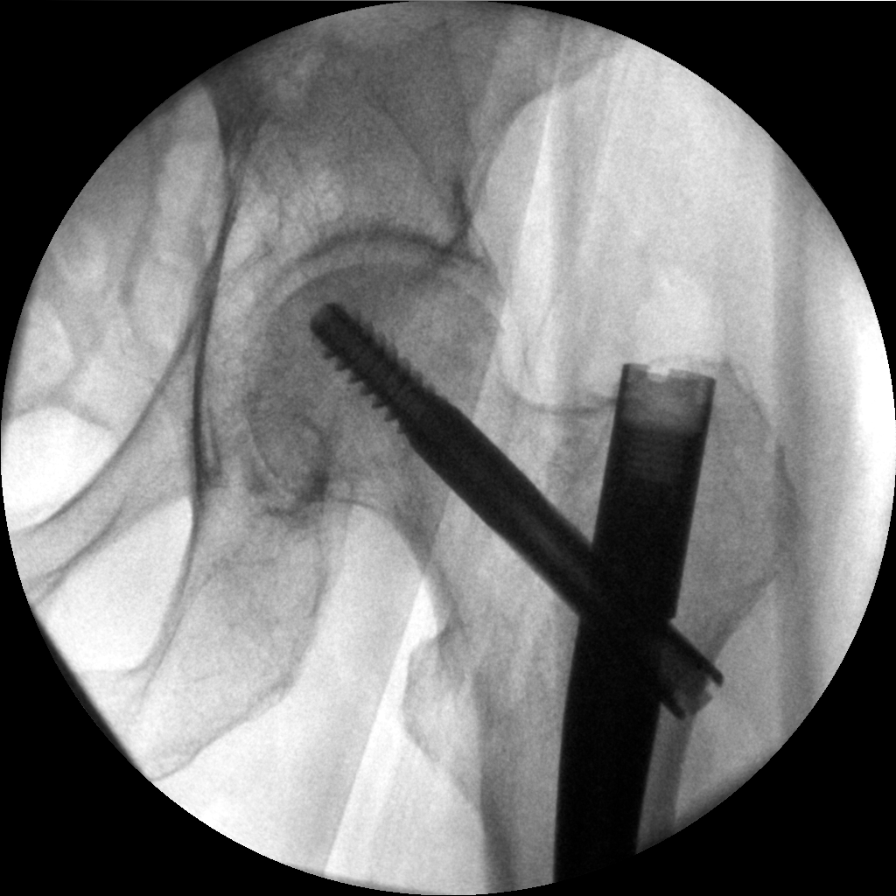

[Series 4: cont. · 1 of 1 slices shown (4 of 7)]
[im 1/1]
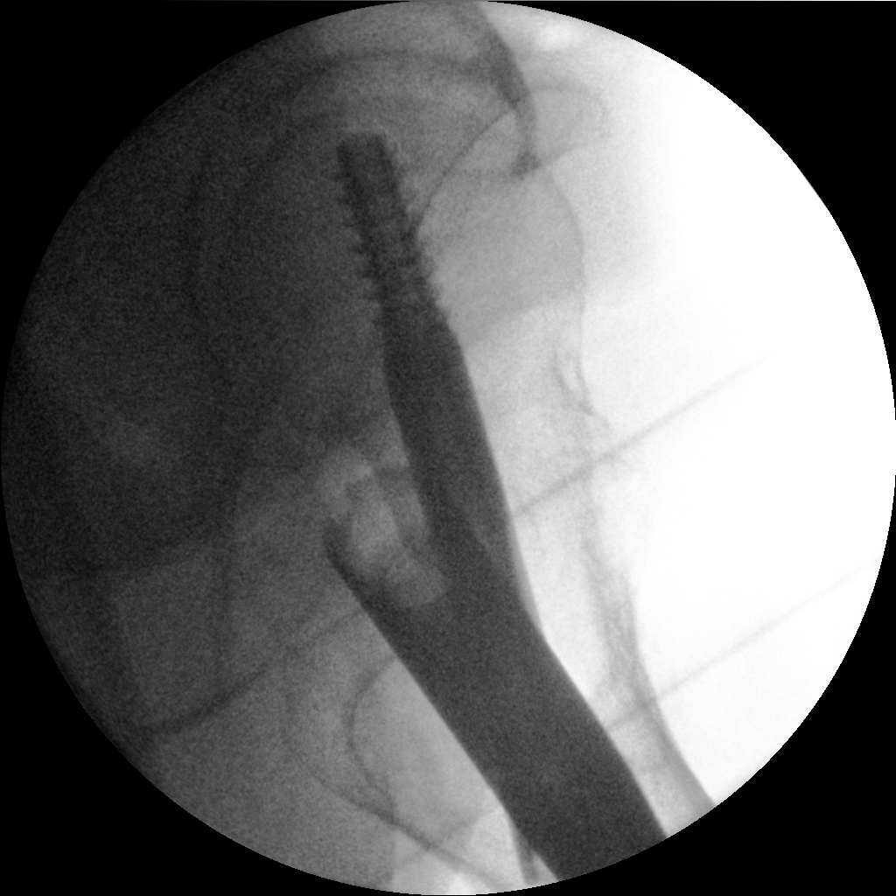

[Series 5: cont. · 1 of 1 slices shown (5 of 7)]
[im 1/1]
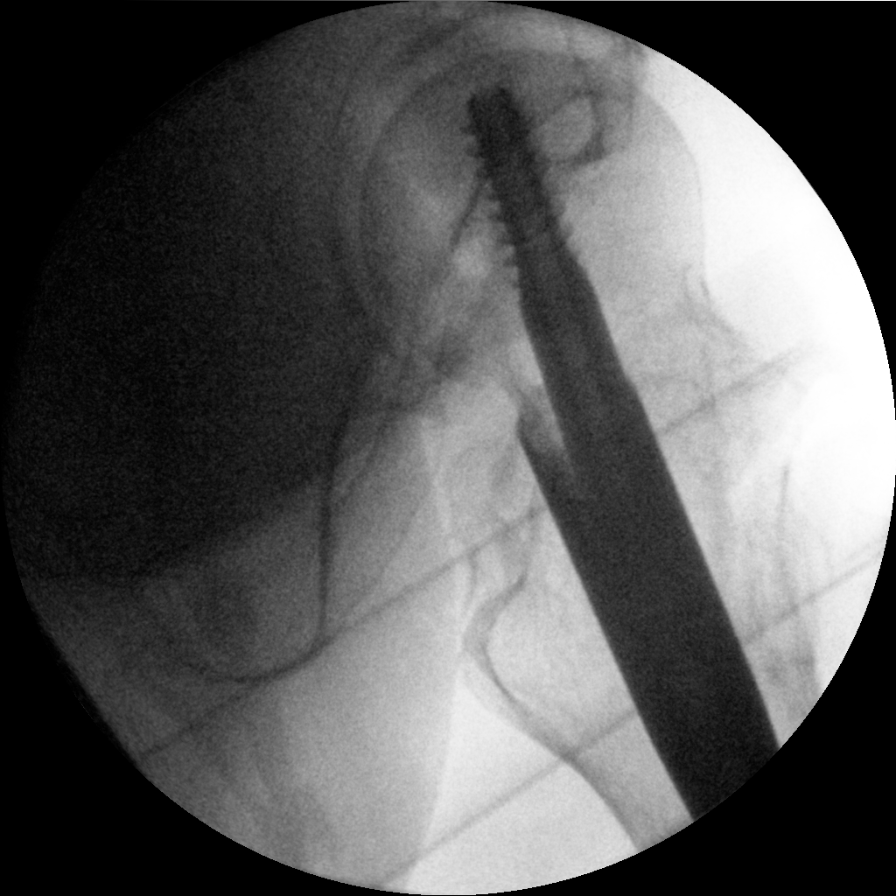

[Series 6: cont. · 1 of 1 slices shown (6 of 7)]
[im 1/1]
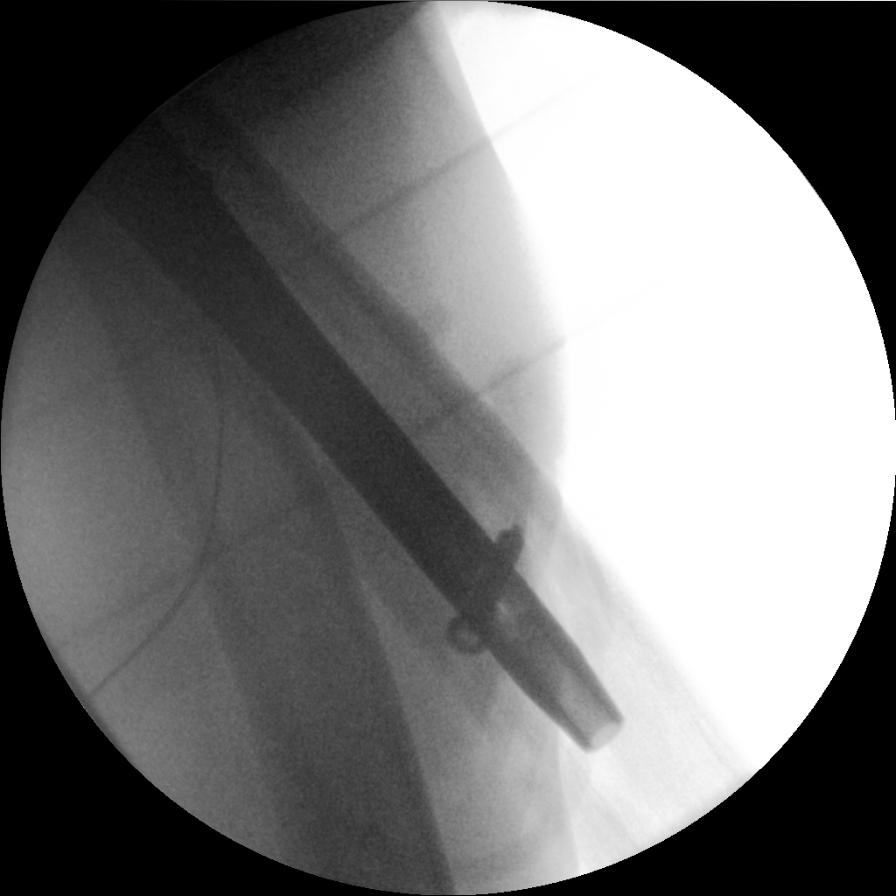

[Series 7: cont. · 1 of 1 slices shown (7 of 7)]
[im 1/1]
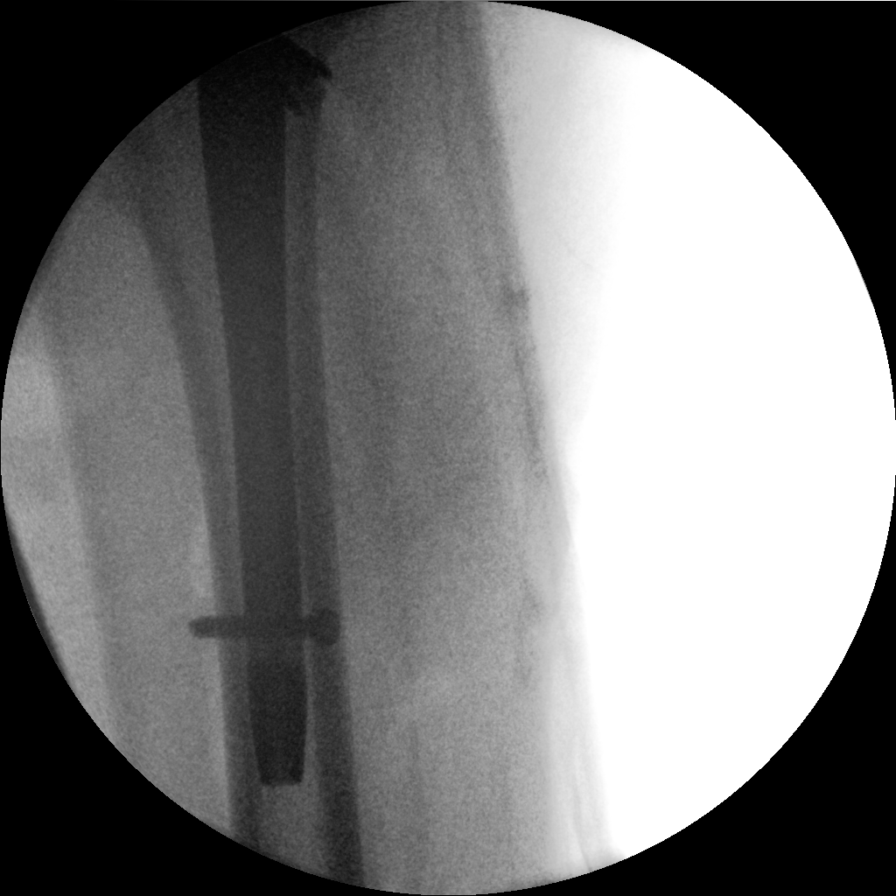

[7 of 7 positions shown; findings below may reference images not displayed]

FINDINGS: Seven C-arm spot films were returned. These images show the left
intertrochanteric femoral fracture. I am nail and fixation screw
replaced for fixation. No complicating features are seen.
IMPRESSION: IM nailing left intertrochanteric femoral fracture.

## 2020-02-23 ENCOUNTER — Other Ambulatory Visit: Payer: Self-pay

## 2020-02-23 DIAGNOSIS — I10 Essential (primary) hypertension: Secondary | ICD-10-CM | POA: Diagnosis not present

## 2020-02-23 DIAGNOSIS — Z79899 Other long term (current) drug therapy: Secondary | ICD-10-CM | POA: Insufficient documentation

## 2020-02-23 DIAGNOSIS — Z87891 Personal history of nicotine dependence: Secondary | ICD-10-CM | POA: Diagnosis not present

## 2020-02-23 DIAGNOSIS — R109 Unspecified abdominal pain: Secondary | ICD-10-CM | POA: Diagnosis present

## 2020-02-23 DIAGNOSIS — R1013 Epigastric pain: Secondary | ICD-10-CM | POA: Diagnosis not present

## 2020-02-23 DIAGNOSIS — R112 Nausea with vomiting, unspecified: Secondary | ICD-10-CM | POA: Insufficient documentation

## 2020-02-23 LAB — COMPREHENSIVE METABOLIC PANEL
ALT: 11 U/L (ref 0–44)
AST: 23 U/L (ref 15–41)
Albumin: 4.2 g/dL (ref 3.5–5.0)
Alkaline Phosphatase: 77 U/L (ref 38–126)
Anion gap: 13 (ref 5–15)
BUN: 19 mg/dL (ref 8–23)
CO2: 24 mmol/L (ref 22–32)
Calcium: 9.4 mg/dL (ref 8.9–10.3)
Chloride: 97 mmol/L — ABNORMAL LOW (ref 98–111)
Creatinine, Ser: 0.74 mg/dL (ref 0.44–1.00)
GFR, Estimated: >60 mL/min (ref >60)
Glucose, Bld: 158 mg/dL — ABNORMAL HIGH (ref 70–99)
Potassium: 2.8 mmol/L — ABNORMAL LOW (ref 3.5–5.1)
Sodium: 134 mmol/L — ABNORMAL LOW (ref 135–145)
Total Bilirubin: 1.2 mg/dL (ref 0.3–1.2)
Total Protein: 8.1 g/dL (ref 6.5–8.1)

## 2020-02-23 LAB — CBC
HCT: 33.9 % — ABNORMAL LOW (ref 36.0–46.0)
Hemoglobin: 11.9 g/dL — ABNORMAL LOW (ref 12.0–15.0)
MCH: 31.4 pg (ref 26.0–34.0)
MCHC: 35.1 g/dL (ref 30.0–36.0)
MCV: 89.4 fL (ref 80.0–100.0)
Platelets: 150 10*3/uL (ref 150–400)
RBC: 3.79 MIL/uL — ABNORMAL LOW (ref 3.87–5.11)
RDW: 13.7 % (ref 11.5–15.5)
WBC: 13.6 10*3/uL — ABNORMAL HIGH (ref 4.0–10.5)
nRBC: 0 % (ref 0.0–0.2)

## 2020-02-23 LAB — TROPONIN I (HIGH SENSITIVITY)
Troponin I (High Sensitivity): 15 ng/L (ref <18)
Troponin I (High Sensitivity): 17 ng/L (ref <18)

## 2020-02-23 LAB — LACTIC ACID, PLASMA: Lactic Acid, Venous: 1.8 mmol/L (ref 0.5–1.9)

## 2020-02-23 LAB — LIPASE, BLOOD: Lipase: 42 U/L (ref 11–51)

## 2020-02-23 MED ORDER — ONDANSETRON 4 MG PO TBDP
4.0000 mg | ORAL_TABLET | Freq: Once | ORAL | Status: AC | PRN
  Administered 2020-02-23: 4 mg via ORAL
  Filled 2020-02-23: qty 1

## 2020-02-23 MED ORDER — ACETAMINOPHEN 325 MG PO TABS
650.0000 mg | ORAL_TABLET | Freq: Once | ORAL | Status: AC | PRN
  Administered 2020-02-23: 650 mg via ORAL
  Filled 2020-02-23: qty 2

## 2020-02-23 NOTE — ED Triage Notes (Signed)
Pt to ED via [facility] from home for chief complaint of emesis x 3 days with mid abdominal pain and fever.  Pt vomiting in triage    Febrile, alert and oriented, clear speech

## 2020-02-23 NOTE — ED Triage Notes (Signed)
Pt comes into the ED via EMS from home with c/o N/V increased weakness over the past 3 days, 180/90 [id number] #20gLFA 4mg  Zofran

## 2020-02-24 ENCOUNTER — Emergency Department: Payer: Medicare Other

## 2020-02-24 ENCOUNTER — Emergency Department
Admission: EM | Admit: 2020-02-24 | Discharge: 2020-02-24 | Disposition: A | Discharge status: Home / Self Care | Payer: Medicare Other | Attending: Emergency Medicine | Admitting: Emergency Medicine

## 2020-02-24 DIAGNOSIS — R112 Nausea with vomiting, unspecified: Secondary | ICD-10-CM

## 2020-02-24 DIAGNOSIS — R1013 Epigastric pain: Secondary | ICD-10-CM

## 2020-02-24 MED ORDER — IOHEXOL 300 MG/ML  SOLN
100.0000 mL | Freq: Once | INTRAMUSCULAR | Status: AC | PRN
  Administered 2020-02-24: 100 mL via INTRAVENOUS
  Filled 2020-02-24: qty 100

## 2020-02-24 MED ORDER — LACTATED RINGERS IV BOLUS
1000.0000 mL | Freq: Once | INTRAVENOUS | Status: AC
  Administered 2020-02-24: 1000 mL via INTRAVENOUS

## 2020-02-24 MED ORDER — POTASSIUM CHLORIDE CRYS ER 20 MEQ PO TBCR
40.0000 meq | EXTENDED_RELEASE_TABLET | Freq: Once | ORAL | Status: AC
  Administered 2020-02-24: 40 meq via ORAL
  Filled 2020-02-24: qty 2

## 2020-02-24 MED ORDER — POTASSIUM CHLORIDE ER 10 MEQ PO TBCR: 10.0000 meq | EXTENDED_RELEASE_TABLET | Freq: Every day | ORAL | 0 refills | Status: DC

## 2020-02-24 MED ORDER — ONDANSETRON 4 MG PO TBDP: 4.0000 mg | ORAL_TABLET | Freq: Three times a day (TID) | ORAL | 0 refills | Status: DC | PRN

## 2020-02-24 MED ORDER — ONDANSETRON HCL 4 MG/2ML IJ SOLN
4.0000 mg | Freq: Once | INTRAMUSCULAR | Status: AC
  Administered 2020-02-24: 4 mg via INTRAVENOUS
  Filled 2020-02-24: qty 2

## 2020-02-24 MED ORDER — MORPHINE SULFATE (PF) 4 MG/ML IV SOLN
4.0000 mg | Freq: Once | INTRAVENOUS | Status: AC
  Administered 2020-02-24: 4 mg via INTRAVENOUS
  Filled 2020-02-24: qty 1

## 2020-02-24 MED ORDER — POTASSIUM CHLORIDE 10 MEQ/100ML IV SOLN
10.0000 meq | INTRAVENOUS | Status: AC
  Administered 2020-02-24 (×2): 10 meq via INTRAVENOUS
  Filled 2020-02-24 (×2): qty 100

## 2020-02-24 NOTE — ED Provider Notes (Signed)
[facility] Emergency Department Provider Note   ____________________________________________   Event Date/Time   First MD Initiated Contact with Patient 02/24/20 0157     (approximate)  I have reviewed the triage vital signs and the nursing notes.   HISTORY  Chief Complaint Emesis and Abdominal Pain    HPI Alison Price is a 75 y.o. female with past medical history of hypertension, GERD, chronic pain who presents to the ED complaining of abdominal pain and vomiting.  Patient reports that for the past 2 to 3 days she has been feeling very nauseous with multiple episodes of vomiting.  This is been associated with occasional diarrhea, but she has not noticed any blood in her emesis or stool.  She denies any fevers, dysuria, or hematuria.  She has developed diffuse abdominal pain, worst in her epigastrium.  She describes this pain as sharp and worse when she vomits.  She denies any cough, chest pain, or shortness of breath.        Past Medical History:  Diagnosis Date  . Anemia   . Arthritis   . Chronic pain   . GERD (gastroesophageal reflux disease)   . Hypertension   . Osteoporosis   . Stevens-Johnson syndrome [facility])     Patient Active Problem List   Diagnosis Date Noted  . Protein-calorie malnutrition, severe 07/01/2019  . Other chronic pain   . Urinary tract infection due to extended-spectrum beta lactamase (ESBL) producing Escherichia coli   . Weakness   . Constipation   . Abdominal pain 06/29/2019  . Hypomagnesemia   . Gastroesophageal reflux disease without esophagitis   . Hip fx ([place]) 08/05/2017  . Nausea and vomiting 10/23/2016  . Hypokalemia 10/23/2016  . Dehydration 10/23/2016  . Essential hypertension 10/23/2016  . History of total abdominal hysterectomy 06/26/2015  . Pelvic pressure in female 06/26/2015  . Vaginal atrophy 06/26/2015  . Hematuria 06/26/2015  . Dysuria 06/26/2015  . Pancytopenia ([place]) 04/13/2015    Past  Surgical History:  Procedure Laterality Date  . ABDOMINAL HYSTERECTOMY     partial  . BACK SURGERY    . ESOPHAGOGASTRODUODENOSCOPY N/A 06/30/2019   Procedure: ESOPHAGOGASTRODUODENOSCOPY (EGD);  Surgeon: Toledo, Benay Pike, MD;  Location: [facility] ENDOSCOPY;  Service: Gastroenterology;  Laterality: N/A;  . FOOT SURGERY    . HAND SURGERY     THUMB REPAIR  . HEMORRHOID SURGERY    . INTRAMEDULLARY (IM) NAIL INTERTROCHANTERIC Left 08/06/2017   Procedure: INTRAMEDULLARY (IM) NAIL INTERTROCHANTRIC;  Surgeon: Leim Fabry, MD;  Location: [facility] ORS;  Service: Orthopedics;  Laterality: Left;  . POSTERIOR REPAIR      Prior to Admission medications   Medication Sig Start Date End Date Taking? Authorizing Provider  ondansetron (ZOFRAN ODT) 4 MG disintegrating tablet Take 1 tablet (4 mg total) by mouth every 8 (eight) hours as needed for nausea or vomiting. 02/24/20  Yes Blake Divine, MD  potassium chloride (KLOR-CON) 10 MEQ tablet Take 1 tablet (10 mEq total) by mouth daily. 02/24/20  Yes Blake Divine, MD  alum & mag hydroxide-simeth (MAALOX PLUS) 400-400-40 MG/5ML suspension Take 30 mLs by mouth every 4 (four) hours as needed for indigestion.     [provider]  amLODipine (NORVASC) 5 MG tablet Take 5 mg by mouth daily. 03/08/18   [provider]  butalbital-acetaminophen-caffeine (FIORICET, ESGIC) 50-325-40 MG tablet Take 1-2 tablets by mouth every 6 (six) hours as needed for headache or migraine.     [provider]  Cholecalciferol (VITAMIN  D) 2000 units CAPS Take 6,000 Units by mouth daily.     [provider]  Choline Fenofibrate (FENOFIBRIC ACID) 135 MG CPDR Take 135 mg by mouth daily.     [provider]  dimenhyDRINATE (DRAMAMINE) 50 MG tablet Take 50 mg by mouth every 8 (eight) hours as needed for nausea.     [provider]  escitalopram (LEXAPRO) 5 MG tablet Take 5 mg by mouth daily. 03/08/18   [provider]  feeding supplement,  ENSURE ENLIVE, (ENSURE ENLIVE) LIQD Take 237 mLs by mouth 3 (three) times daily between meals. 07/02/19   Alford Highland, MD  ferrous sulfate 325 (65 FE) MG tablet Take 325 mg by mouth 3 (three) times daily with meals.    [provider]  fexofenadine (ALLEGRA) 180 MG tablet Take 180 mg by mouth daily.    [provider]  fluticasone (FLONASE) 50 MCG/ACT nasal spray Place 2 sprays into both nostrils daily as needed for allergies or rhinitis.     [provider]  Lactulose 20 GM/30ML SOLN Take 30 mLs (20 g total) by mouth daily as needed (constipation). 07/02/19   Alford Highland, MD  lisinopril (ZESTRIL) 20 MG tablet Take 20 mg by mouth daily. 03/08/18   [provider]  meclizine (ANTIVERT) 25 MG tablet Take 25 mg by mouth 2 (two) times daily as needed for dizziness.    [provider]  morphine (MS CONTIN) 60 MG 12 hr tablet Take 1 tablet (60 mg total) by mouth 3 (three) times daily. 07/02/19   Alford Highland, MD  Multiple Vitamin (MULTIVITAMIN) tablet Take 1 tablet by mouth daily.    [provider]  nystatin (MYCOSTATIN) 100000 UNIT/ML suspension Take 5 mLs (500,000 Units total) by mouth 4 (four) times daily. 07/02/19   Alford Highland, MD  Oxycodone HCl 10 MG TABS Take 10 mg by mouth 3 (three) times daily. 06/10/19   [provider]  pantoprazole (PROTONIX) 40 MG tablet Take 1 tablet (40 mg total) by mouth daily. 07/02/19 07/01/20  Alford Highland, MD  polyethylene glycol (MIRALAX / GLYCOLAX) 17 g packet Take 17 g by mouth daily. 06/13/18   Alford Highland, MD  senna-docusate (SENOKOT-S) 8.6-50 MG tablet Take 1 tablet by mouth 2 (two) times daily. 06/12/18   Alford Highland, MD  vitamin C (ASCORBIC ACID) 500 MG tablet Take 500 mg by mouth 3 (three) times daily. anemia- give with ferrous sulfate to potentiate iron absorption    [provider]    Allergies Codeine and Sulfa antibiotics  Family History  Problem Relation  Age of Onset  . Diabetes Paternal Grandmother   . Uterine cancer Maternal Aunt   . Stomach cancer Maternal Aunt   . Alcohol abuse Father   . Breast cancer Neg Hx   . Ovarian cancer Neg Hx   . Colon cancer Neg Hx   . Heart disease Neg Hx     Social History Social History   Tobacco Use  . Smoking status: Former Smoker    Quit date: 02/27/1986    Years since quitting: 34.0  . Smokeless tobacco: Never Used  Vaping Use  . Vaping Use: Never used  Substance Use Topics  . Alcohol use: No  . Drug use: No    Review of Systems  Constitutional: No fever/chills Eyes: No visual changes. ENT: No sore throat. Cardiovascular: Denies chest pain. Respiratory: Denies shortness of breath. Gastrointestinal: Positive for abdominal pain, nausea, vomiting, and diarrhea.  No constipation. Genitourinary: Negative  for dysuria. Musculoskeletal: Negative for back pain. Skin: Negative for rash. Neurological: Negative for headaches, focal weakness or numbness.  ____________________________________________   PHYSICAL EXAM:  VITAL SIGNS: ED Triage Vitals  Enc Vitals Group     BP 02/23/20 1718 (!) 171/69     Pulse Rate 02/23/20 1718 65     Resp 02/23/20 1718 18     Temp 02/23/20 1718 (!) 101.1 F (38.4 C)     Temp Source 02/23/20 1718 Oral     SpO2 02/23/20 1718 98 %     Weight 02/23/20 1720 170 lb (77.1 kg)     Height 02/23/20 1720 5\' 9"  (1.753 m)     Head Circumference --      Peak Flow --      Pain Score 02/23/20 1720 6     Pain Loc --      Pain Edu? --      Excl. in GC? --     Constitutional: Alert and oriented. Eyes: Conjunctivae are normal. Head: Atraumatic. Nose: No congestion/rhinnorhea. Mouth/Throat: Mucous membranes are moist. Neck: Normal ROM Cardiovascular: Normal rate, regular rhythm. Grossly normal heart sounds. Respiratory: Normal respiratory effort.  No retractions. Lungs CTAB. Gastrointestinal: Soft and tender to palpation in the epigastrium with no rebound or  guarding. No distention. Genitourinary: deferred Musculoskeletal: No lower extremity tenderness nor edema. Neurologic:  Normal speech and language. No gross focal neurologic deficits are appreciated. Skin:  Skin is warm, dry and intact. No rash noted. Psychiatric: Mood and affect are normal. Speech and behavior are normal.  ____________________________________________   LABS (all labs ordered are listed, but only abnormal results are displayed)  Labs Reviewed  COMPREHENSIVE METABOLIC PANEL - Abnormal; Notable for the following components:      Result Value   Sodium 134 (*)    Potassium 2.8 (*)    Chloride 97 (*)    Glucose, Bld 158 (*)    All other components within normal limits  CBC - Abnormal; Notable for the following components:   WBC 13.6 (*)    RBC 3.79 (*)    Hemoglobin 11.9 (*)    HCT 33.9 (*)    All other components within normal limits  LIPASE, BLOOD  LACTIC ACID, PLASMA  URINALYSIS, COMPLETE (UACMP) WITH MICROSCOPIC  TROPONIN I (HIGH SENSITIVITY)  TROPONIN I (HIGH SENSITIVITY)   ____________________________________________  EKG  ED ECG REPORT I, 04/22/20, the attending physician, personally viewed and interpreted this ECG.   Date: 02/24/2020  EKG Time: 3:19  Rate: 84  Rhythm: normal sinus rhythm  Axis: Normal  Intervals:none  ST&T Change: None   PROCEDURES  Procedure(s) performed (including Critical Care):  Procedures   ____________________________________________   INITIAL IMPRESSION / ASSESSMENT AND PLAN / ED COURSE       75 year old female with past medical history of hypertension, GERD, chronic pain who presents to the ED with 2 to 3 days of nausea, vomiting, and diarrhea now associated with sharp pain in her epigastrium.  Symptoms seem most likely related to gastroenteritis, but patient does appear dehydrated with hypokalemia.  We will hydrate with IV fluids and replete potassium, add on magnesium level.  Given her abdominal  tenderness, we will also further assess with CT scan.  Patient treated symptomatically with IV morphine and Zofran.   Patient feeling much better following morphine and Zofran.  CT scan shows chronic thickening of sigmoid colon and rectum, similar to previous imaging.  Patient informed of this finding as well as need to follow-up  with GI for colonoscopy.  Additionally, CT shows fat stranding within the right lower quadrant mesentery of unclear significance.  Patient believes she has had her appendix taken out and she currently has no tenderness whatsoever in the right lower quadrant.  I have a very low suspicion for appendicitis at this time.  She has now been able to tolerate p.o. without difficulty and was given additional potassium supplementation.  She is appropriate for discharge home with PCP and GI follow-up, was counseled to return to the ED for new worsening symptoms.  She was prescribed Zofran and 5 days of potassium supplementation.  Patient agrees with plan.      ____________________________________________   FINAL CLINICAL IMPRESSION(S) / ED DIAGNOSES  Final diagnoses:  Epigastric pain  Non-intractable vomiting with nausea, unspecified vomiting type     ED Discharge Orders         Ordered    ondansetron (ZOFRAN ODT) 4 MG disintegrating tablet  Every 8 hours PRN        02/24/20 0526    potassium chloride (KLOR-CON) 10 MEQ tablet  Daily        02/24/20 0526           Note:  This document was prepared using Dragon voice recognition software and may include unintentional dictation errors.   Chesley Noon, MD 02/24/20 [id number]

## 2020-02-24 NOTE — ED Notes (Signed)
Pt given Sprite for PO challenge.

## 2020-02-24 NOTE — ED Notes (Signed)
Pt has cell phone and is currently calling for rides. NAD Noted and call bell in reach.

## 2020-02-24 NOTE — ED Notes (Signed)
Pt transported to CT ?

## 2020-04-13 ENCOUNTER — Inpatient Hospital Stay: Payer: Medicare Other

## 2020-04-13 ENCOUNTER — Inpatient Hospital Stay
Admission: EM | Admit: 2020-04-13 | Discharge: 2020-04-17 | DRG: 854 | Disposition: A | Discharge status: Home / Self Care | Payer: Medicare Other | Attending: Internal Medicine | Admitting: Internal Medicine

## 2020-04-13 ENCOUNTER — Inpatient Hospital Stay: Payer: Medicare Other | Admitting: Certified Registered Nurse Anesthetist

## 2020-04-13 ENCOUNTER — Emergency Department: Payer: Medicare Other

## 2020-04-13 ENCOUNTER — Encounter
Admission: EM | Disposition: A | Discharge status: Home / Self Care | Payer: Self-pay | Source: Home / Self Care | Attending: Internal Medicine

## 2020-04-13 ENCOUNTER — Encounter: Payer: Self-pay | Admitting: Emergency Medicine

## 2020-04-13 ENCOUNTER — Other Ambulatory Visit: Payer: Self-pay

## 2020-04-13 DIAGNOSIS — A419 Sepsis, unspecified organism: Secondary | ICD-10-CM

## 2020-04-13 DIAGNOSIS — G8929 Other chronic pain: Secondary | ICD-10-CM | POA: Diagnosis not present

## 2020-04-13 DIAGNOSIS — L511 Stevens-Johnson syndrome: Secondary | ICD-10-CM | POA: Diagnosis present

## 2020-04-13 DIAGNOSIS — N390 Urinary tract infection, site not specified: Secondary | ICD-10-CM | POA: Diagnosis not present

## 2020-04-13 DIAGNOSIS — Z90710 Acquired absence of both cervix and uterus: Secondary | ICD-10-CM | POA: Diagnosis not present

## 2020-04-13 DIAGNOSIS — N10 Acute tubulo-interstitial nephritis: Secondary | ICD-10-CM | POA: Diagnosis present

## 2020-04-13 DIAGNOSIS — G894 Chronic pain syndrome: Secondary | ICD-10-CM | POA: Diagnosis present

## 2020-04-13 DIAGNOSIS — M810 Age-related osteoporosis without current pathological fracture: Secondary | ICD-10-CM | POA: Diagnosis present

## 2020-04-13 DIAGNOSIS — K219 Gastro-esophageal reflux disease without esophagitis: Secondary | ICD-10-CM | POA: Diagnosis present

## 2020-04-13 DIAGNOSIS — A499 Bacterial infection, unspecified: Secondary | ICD-10-CM

## 2020-04-13 DIAGNOSIS — Z1612 Extended spectrum beta lactamase (ESBL) resistance: Secondary | ICD-10-CM | POA: Diagnosis present

## 2020-04-13 DIAGNOSIS — Z79899 Other long term (current) drug therapy: Secondary | ICD-10-CM | POA: Diagnosis not present

## 2020-04-13 DIAGNOSIS — Z87891 Personal history of nicotine dependence: Secondary | ICD-10-CM

## 2020-04-13 DIAGNOSIS — I10 Essential (primary) hypertension: Secondary | ICD-10-CM | POA: Diagnosis present

## 2020-04-13 DIAGNOSIS — Z87440 Personal history of urinary (tract) infections: Secondary | ICD-10-CM | POA: Diagnosis not present

## 2020-04-13 DIAGNOSIS — Z20822 Contact with and (suspected) exposure to covid-19: Secondary | ICD-10-CM | POA: Diagnosis present

## 2020-04-13 DIAGNOSIS — R197 Diarrhea, unspecified: Secondary | ICD-10-CM | POA: Diagnosis present

## 2020-04-13 DIAGNOSIS — Q6211 Congenital occlusion of ureteropelvic junction: Secondary | ICD-10-CM | POA: Diagnosis not present

## 2020-04-13 DIAGNOSIS — Z800 Family history of malignant neoplasm of digestive organs: Secondary | ICD-10-CM

## 2020-04-13 DIAGNOSIS — N200 Calculus of kidney: Secondary | ICD-10-CM

## 2020-04-13 DIAGNOSIS — N12 Tubulo-interstitial nephritis, not specified as acute or chronic: Secondary | ICD-10-CM

## 2020-04-13 DIAGNOSIS — Z8049 Family history of malignant neoplasm of other genital organs: Secondary | ICD-10-CM

## 2020-04-13 DIAGNOSIS — Z833 Family history of diabetes mellitus: Secondary | ICD-10-CM | POA: Diagnosis not present

## 2020-04-13 DIAGNOSIS — R109 Unspecified abdominal pain: Secondary | ICD-10-CM

## 2020-04-13 DIAGNOSIS — Z79891 Long term (current) use of opiate analgesic: Secondary | ICD-10-CM | POA: Diagnosis not present

## 2020-04-13 DIAGNOSIS — A4151 Sepsis due to Escherichia coli [E. coli]: Principal | ICD-10-CM | POA: Diagnosis present

## 2020-04-13 DIAGNOSIS — N139 Obstructive and reflux uropathy, unspecified: Secondary | ICD-10-CM | POA: Diagnosis present

## 2020-04-13 DIAGNOSIS — F32A Depression, unspecified: Secondary | ICD-10-CM | POA: Diagnosis present

## 2020-04-13 DIAGNOSIS — R7881 Bacteremia: Secondary | ICD-10-CM | POA: Diagnosis not present

## 2020-04-13 DIAGNOSIS — D649 Anemia, unspecified: Secondary | ICD-10-CM | POA: Diagnosis present

## 2020-04-13 DIAGNOSIS — N201 Calculus of ureter: Secondary | ICD-10-CM | POA: Diagnosis not present

## 2020-04-13 DIAGNOSIS — A498 Other bacterial infections of unspecified site: Secondary | ICD-10-CM | POA: Diagnosis not present

## 2020-04-13 DIAGNOSIS — B9620 Unspecified Escherichia coli [E. coli] as the cause of diseases classified elsewhere: Secondary | ICD-10-CM | POA: Diagnosis not present

## 2020-04-13 DIAGNOSIS — N136 Pyonephrosis: Secondary | ICD-10-CM | POA: Diagnosis present

## 2020-04-13 HISTORY — DX: Bacterial infection, unspecified: A49.9

## 2020-04-13 HISTORY — PX: CYSTOSCOPY WITH STENT PLACEMENT: SHX5790

## 2020-04-13 LAB — LIPASE, BLOOD: Lipase: 79 U/L — ABNORMAL HIGH (ref 11–51)

## 2020-04-13 LAB — URINALYSIS, COMPLETE (UACMP) WITH MICROSCOPIC
Bilirubin Urine: NEGATIVE
Glucose, UA: NEGATIVE mg/dL
Hgb urine dipstick: NEGATIVE
Ketones, ur: NEGATIVE mg/dL
Nitrite: POSITIVE — AB
Protein, ur: NEGATIVE mg/dL
Specific Gravity, Urine: 1.02 (ref 1.005–1.030)
Squamous Epithelial / HPF: NONE SEEN (ref 0–5)
pH: 7 (ref 5.0–8.0)

## 2020-04-13 LAB — TYPE AND SCREEN
ABO/RH(D): A POS
Antibody Screen: NEGATIVE

## 2020-04-13 LAB — BASIC METABOLIC PANEL
Anion gap: 8 (ref 5–15)
BUN: 22 mg/dL (ref 8–23)
CO2: 28 mmol/L (ref 22–32)
Calcium: 9.7 mg/dL (ref 8.9–10.3)
Chloride: 100 mmol/L (ref 98–111)
Creatinine, Ser: 0.92 mg/dL (ref 0.44–1.00)
GFR, Estimated: >60 mL/min (ref >60)
Glucose, Bld: 142 mg/dL — ABNORMAL HIGH (ref 70–99)
Potassium: 3.8 mmol/L (ref 3.5–5.1)
Sodium: 136 mmol/L (ref 135–145)

## 2020-04-13 LAB — TROPONIN I (HIGH SENSITIVITY): Troponin I (High Sensitivity): 8 ng/L (ref <18)

## 2020-04-13 LAB — CBC
HCT: 31.7 % — ABNORMAL LOW (ref 36.0–46.0)
Hemoglobin: 10.3 g/dL — ABNORMAL LOW (ref 12.0–15.0)
MCH: 30.2 pg (ref 26.0–34.0)
MCHC: 32.5 g/dL (ref 30.0–36.0)
MCV: 93 fL (ref 80.0–100.0)
Platelets: 196 10*3/uL (ref 150–400)
RBC: 3.41 MIL/uL — ABNORMAL LOW (ref 3.87–5.11)
RDW: 15.1 % (ref 11.5–15.5)
WBC: 10.5 10*3/uL (ref 4.0–10.5)
nRBC: 0 % (ref 0.0–0.2)

## 2020-04-13 LAB — HEPATIC FUNCTION PANEL
ALT: 10 U/L (ref 0–44)
AST: 21 U/L (ref 15–41)
Albumin: 4 g/dL (ref 3.5–5.0)
Alkaline Phosphatase: 91 U/L (ref 38–126)
Bilirubin, Direct: <0.1 mg/dL (ref 0.0–0.2)
Total Bilirubin: 0.8 mg/dL (ref 0.3–1.2)
Total Protein: 7.7 g/dL (ref 6.5–8.1)

## 2020-04-13 LAB — PROTIME-INR
INR: 1.1 (ref 0.8–1.2)
Prothrombin Time: 13.4 seconds (ref 11.4–15.2)

## 2020-04-13 LAB — RESP PANEL BY RT-PCR (FLU A&B, COVID) ARPGX2
Influenza A by PCR: NEGATIVE
Influenza B by PCR: NEGATIVE
SARS Coronavirus 2 by RT PCR: NEGATIVE

## 2020-04-13 LAB — APTT: aPTT: 26 seconds (ref 24–36)

## 2020-04-13 SURGERY — CYSTOSCOPY, WITH STENT INSERTION
Anesthesia: General | Laterality: Right

## 2020-04-13 MED ORDER — ADULT MULTIVITAMIN W/MINERALS CH
1.0000 | ORAL_TABLET | Freq: Every day | ORAL | Status: DC
  Administered 2020-04-14 – 2020-04-17 (×4): 1 via ORAL
  Filled 2020-04-13 (×4): qty 1

## 2020-04-13 MED ORDER — PROPOFOL 10 MG/ML IV BOLUS
INTRAVENOUS | Status: DC | PRN
  Administered 2020-04-13: 40 mg via INTRAVENOUS
  Administered 2020-04-13: 110 mg via INTRAVENOUS
  Administered 2020-04-13: 30 mg via INTRAVENOUS

## 2020-04-13 MED ORDER — LISINOPRIL 20 MG PO TABS
20.0000 mg | ORAL_TABLET | Freq: Every day | ORAL | Status: DC
Start: 2020-04-14 — End: 2020-04-17
  Administered 2020-04-14 – 2020-04-17 (×4): 20 mg via ORAL
  Filled 2020-04-13 (×4): qty 1

## 2020-04-13 MED ORDER — ONDANSETRON HCL 4 MG/2ML IJ SOLN
4.0000 mg | Freq: Once | INTRAMUSCULAR | Status: AC
  Administered 2020-04-13: 4 mg via INTRAVENOUS
  Filled 2020-04-13: qty 2

## 2020-04-13 MED ORDER — FENTANYL CITRATE (PF) 100 MCG/2ML IJ SOLN
25.0000 ug | INTRAMUSCULAR | Status: DC | PRN
Start: 2020-04-13 — End: 2020-04-13

## 2020-04-13 MED ORDER — FLUTICASONE PROPIONATE 50 MCG/ACT NA SUSP
2.0000 | Freq: Every day | NASAL | Status: DC | PRN
  Filled 2020-04-13: qty 16

## 2020-04-13 MED ORDER — OXYCODONE HCL 5 MG PO TABS
5.0000 mg | ORAL_TABLET | Freq: Three times a day (TID) | ORAL | Status: DC
  Administered 2020-04-13 – 2020-04-17 (×10): 5 mg via ORAL
  Filled 2020-04-13 (×10): qty 1

## 2020-04-13 MED ORDER — MECLIZINE HCL 25 MG PO TABS
25.0000 mg | ORAL_TABLET | Freq: Two times a day (BID) | ORAL | Status: DC | PRN
  Filled 2020-04-13: qty 1

## 2020-04-13 MED ORDER — SODIUM CHLORIDE 0.9 % IV SOLN
1.0000 g | Freq: Once | INTRAVENOUS | Status: AC
  Administered 2020-04-13: 1 g via INTRAVENOUS
  Filled 2020-04-13: qty 10

## 2020-04-13 MED ORDER — FERROUS SULFATE 325 (65 FE) MG PO TABS
325.0000 mg | ORAL_TABLET | Freq: Three times a day (TID) | ORAL | Status: DC
  Administered 2020-04-14 – 2020-04-17 (×10): 325 mg via ORAL
  Filled 2020-04-13 (×10): qty 1

## 2020-04-13 MED ORDER — MORPHINE SULFATE (PF) 2 MG/ML IV SOLN: 1.0000 mg | INTRAVENOUS | Status: DC | PRN

## 2020-04-13 MED ORDER — HYDRALAZINE HCL 20 MG/ML IJ SOLN
5.0000 mg | INTRAMUSCULAR | Status: DC | PRN
  Administered 2020-04-13: 5 mg via INTRAVENOUS
  Filled 2020-04-13: qty 1

## 2020-04-13 MED ORDER — LACTATED RINGERS IV SOLN: INTRAVENOUS | Status: DC | PRN

## 2020-04-13 MED ORDER — MORPHINE SULFATE ER 15 MG PO TBCR
15.0000 mg | EXTENDED_RELEASE_TABLET | Freq: Three times a day (TID) | ORAL | Status: DC
Start: 2020-04-13 — End: 2020-04-17
  Administered 2020-04-13 – 2020-04-16 (×9): 15 mg via ORAL
  Filled 2020-04-13 (×9): qty 1

## 2020-04-13 MED ORDER — ENSURE ENLIVE PO LIQD
237.0000 mL | Freq: Three times a day (TID) | ORAL | Status: DC
  Administered 2020-04-15 – 2020-04-16 (×2): 237 mL via ORAL

## 2020-04-13 MED ORDER — DEXAMETHASONE SODIUM PHOSPHATE 10 MG/ML IJ SOLN
INTRAMUSCULAR | Status: DC | PRN
  Administered 2020-04-13: 10 mg via INTRAVENOUS

## 2020-04-13 MED ORDER — IOHEXOL 300 MG/ML  SOLN
100.0000 mL | Freq: Once | INTRAMUSCULAR | Status: AC | PRN
  Administered 2020-04-13: 100 mL via INTRAVENOUS

## 2020-04-13 MED ORDER — SODIUM CHLORIDE 0.9 % IV BOLUS
1000.0000 mL | Freq: Once | INTRAVENOUS | Status: AC
  Administered 2020-04-13: 1000 mL via INTRAVENOUS

## 2020-04-13 MED ORDER — LORATADINE 10 MG PO TABS
10.0000 mg | ORAL_TABLET | Freq: Every day | ORAL | Status: DC
  Administered 2020-04-14 – 2020-04-17 (×4): 10 mg via ORAL
  Filled 2020-04-13 (×4): qty 1

## 2020-04-13 MED ORDER — PROMETHAZINE HCL 25 MG/ML IJ SOLN: 6.2500 mg | Freq: Once | INTRAMUSCULAR | Status: DC | PRN

## 2020-04-13 MED ORDER — DIMENHYDRINATE 50 MG PO TABS
50.0000 mg | ORAL_TABLET | Freq: Three times a day (TID) | ORAL | Status: DC | PRN
  Administered 2020-04-13 – 2020-04-16 (×5): 50 mg via ORAL
  Filled 2020-04-13 (×7): qty 1

## 2020-04-13 MED ORDER — AMLODIPINE BESYLATE 5 MG PO TABS
5.0000 mg | ORAL_TABLET | Freq: Every day | ORAL | Status: DC
  Administered 2020-04-14 – 2020-04-17 (×4): 5 mg via ORAL
  Filled 2020-04-13 (×4): qty 1

## 2020-04-13 MED ORDER — PROMETHAZINE HCL 25 MG/ML IJ SOLN
INTRAMUSCULAR | Status: AC
  Administered 2020-04-13: 6.25 mg via INTRAVENOUS
  Filled 2020-04-13: qty 1

## 2020-04-13 MED ORDER — SUCCINYLCHOLINE CHLORIDE 20 MG/ML IJ SOLN
INTRAMUSCULAR | Status: DC | PRN
  Administered 2020-04-13: 100 mg via INTRAVENOUS

## 2020-04-13 MED ORDER — FENTANYL CITRATE (PF) 100 MCG/2ML IJ SOLN
50.0000 ug | Freq: Once | INTRAMUSCULAR | Status: AC
  Administered 2020-04-13: 50 ug via INTRAVENOUS
  Filled 2020-04-13: qty 2

## 2020-04-13 MED ORDER — PROMETHAZINE HCL 25 MG/ML IJ SOLN: 6.2500 mg | Freq: Once | INTRAMUSCULAR | Status: AC

## 2020-04-13 MED ORDER — ONDANSETRON HCL 4 MG/2ML IJ SOLN: 4.0000 mg | Freq: Three times a day (TID) | INTRAMUSCULAR | Status: DC | PRN

## 2020-04-13 MED ORDER — PROPOFOL 10 MG/ML IV BOLUS
INTRAVENOUS | Status: AC
  Filled 2020-04-13: qty 20

## 2020-04-13 MED ORDER — ALUM & MAG HYDROXIDE-SIMETH 200-200-20 MG/5ML PO SUSP: 30.0000 mL | ORAL | Status: DC | PRN

## 2020-04-13 MED ORDER — BUTALBITAL-APAP-CAFFEINE 50-325-40 MG PO TABS
1.0000 | ORAL_TABLET | Freq: Four times a day (QID) | ORAL | Status: DC | PRN
  Administered 2020-04-14: 2 via ORAL
  Administered 2020-04-14: 1 via ORAL
  Filled 2020-04-13: qty 2
  Filled 2020-04-13: qty 1

## 2020-04-13 MED ORDER — PANTOPRAZOLE SODIUM 40 MG PO TBEC
40.0000 mg | DELAYED_RELEASE_TABLET | Freq: Every day | ORAL | Status: DC
  Administered 2020-04-14 – 2020-04-17 (×4): 40 mg via ORAL
  Filled 2020-04-13 (×4): qty 1

## 2020-04-13 MED ORDER — LACTULOSE 10 GM/15ML PO SOLN: 20.0000 g | Freq: Every day | ORAL | Status: DC | PRN

## 2020-04-13 MED ORDER — ESCITALOPRAM OXALATE 10 MG PO TABS
5.0000 mg | ORAL_TABLET | Freq: Every day | ORAL | Status: DC
  Administered 2020-04-14 – 2020-04-17 (×4): 5 mg via ORAL
  Filled 2020-04-13 (×4): qty 0.5

## 2020-04-13 MED ORDER — FENTANYL CITRATE (PF) 100 MCG/2ML IJ SOLN
INTRAMUSCULAR | Status: AC
  Filled 2020-04-13: qty 2

## 2020-04-13 MED ORDER — FENOFIBRATE 145 MG PO TABS
145.0000 mg | ORAL_TABLET | Freq: Every day | ORAL | Status: DC
  Administered 2020-04-14 – 2020-04-17 (×3): 145 mg via ORAL
  Filled 2020-04-13 (×7): qty 1

## 2020-04-13 MED ORDER — ACETAMINOPHEN 325 MG PO TABS
650.0000 mg | ORAL_TABLET | Freq: Four times a day (QID) | ORAL | Status: DC | PRN
  Administered 2020-04-14: 650 mg via ORAL
  Filled 2020-04-13: qty 2

## 2020-04-13 MED ORDER — FENTANYL CITRATE (PF) 100 MCG/2ML IJ SOLN
INTRAMUSCULAR | Status: DC | PRN
  Administered 2020-04-13: 50 ug via INTRAVENOUS

## 2020-04-13 MED ORDER — PROPOFOL 500 MG/50ML IV EMUL
INTRAVENOUS | Status: DC | PRN
  Administered 2020-04-13: 125 ug/kg/min via INTRAVENOUS

## 2020-04-13 MED ORDER — VITAMIN D 25 MCG (1000 UNIT) PO TABS
6000.0000 [IU] | ORAL_TABLET | Freq: Every day | ORAL | Status: DC
  Administered 2020-04-14 – 2020-04-17 (×4): 6000 [IU] via ORAL
  Filled 2020-04-13 (×3): qty 6

## 2020-04-13 MED ORDER — SODIUM CHLORIDE 0.9 % IV SOLN
1.0000 g | Freq: Three times a day (TID) | INTRAVENOUS | Status: DC
  Administered 2020-04-13 – 2020-04-14 (×2): 1 g via INTRAVENOUS
  Filled 2020-04-13 (×4): qty 1

## 2020-04-13 MED ORDER — IOHEXOL 180 MG/ML  SOLN
INTRAMUSCULAR | Status: DC | PRN
  Administered 2020-04-13: 20 mL

## 2020-04-13 SURGICAL SUPPLY — 27 items
BAG DRAIN CYSTO-URO LG1000N (MISCELLANEOUS) ×2 IMPLANT
BAG DRN RND TRDRP ANRFLXCHMBR (UROLOGICAL SUPPLIES) ×1
BAG URINE DRAIN 2000ML AR STRL (UROLOGICAL SUPPLIES) ×2 IMPLANT
BRUSH SCRUB EZ 1% IODOPHOR (MISCELLANEOUS) IMPLANT
CATH FOL 2WAY LX 18X30 (CATHETERS) ×2 IMPLANT
CATH URETL 5X70 OPEN END (CATHETERS) ×2 IMPLANT
CONRAY 43 FOR UROLOGY 50M (MISCELLANEOUS) ×2 IMPLANT
GLOVE BIOGEL PI IND STRL 7.5 (GLOVE) ×1 IMPLANT
GLOVE BIOGEL PI INDICATOR 7.5 (GLOVE) ×1
GOWN STRL REUS W/ TWL LRG LVL3 (GOWN DISPOSABLE) ×1 IMPLANT
GOWN STRL REUS W/ TWL XL LVL3 (GOWN DISPOSABLE) ×1 IMPLANT
GOWN STRL REUS W/TWL LRG LVL3 (GOWN DISPOSABLE) ×2
GOWN STRL REUS W/TWL XL LVL3 (GOWN DISPOSABLE) ×2
GUIDEWIRE STR DUAL SENSOR (WIRE) ×2 IMPLANT
KIT TURNOVER CYSTO (KITS) ×2 IMPLANT
MANIFOLD NEPTUNE II (INSTRUMENTS) IMPLANT
PACK CYSTO AR (MISCELLANEOUS) ×2 IMPLANT
SET CYSTO W/LG BORE CLAMP LF (SET/KITS/TRAYS/PACK) ×2 IMPLANT
SOL .9 NS 3000ML IRR  AL (IV SOLUTION) ×1
SOL .9 NS 3000ML IRR AL (IV SOLUTION) ×1
SOL .9 NS 3000ML IRR UROMATIC (IV SOLUTION) ×1 IMPLANT
STENT URET 6FRX24 CONTOUR (STENTS) IMPLANT
STENT URET 6FRX26 CONTOUR (STENTS) ×2 IMPLANT
SURGILUBE 2OZ TUBE FLIPTOP (MISCELLANEOUS) ×2 IMPLANT
SYR TOOMEY IRRIG 70ML (MISCELLANEOUS)
SYRINGE TOOMEY IRRIG 70ML (MISCELLANEOUS) IMPLANT
WATER STERILE IRR 1000ML POUR (IV SOLUTION) ×2 IMPLANT

## 2020-04-13 NOTE — ED Notes (Signed)
Pt presents to ED with c/o of ABD pain, N/V. Pt states "I usually have diarrhea so I am not here for that". Pt states ABD pain and vomiting has been ongoing for a few days at this point. ABD is tender on palpation. Pt states a possible fever last night at at home. Pt denies urinary symptoms at this time. Pt is A&Ox4.

## 2020-04-13 NOTE — Consult Note (Signed)
Pharmacy Antibiotic Note  Alison Price is a 75 y.o. female admitted on 04/13/2020 with acute pyelonephritis.  Pharmacy has been consulted for meropenem dosing. Hx od ESBL in urine culture on 06/2019.   Plan: Meropenem 1 g q8H   Height: 5\' 8"  (172.7 cm) Weight: 63 kg (139 lb) IBW/kg (Calculated) : 63.9  Temp (24hrs), Avg:98.1 F (36.7 C), Min:98.1 F (36.7 C), Max:98.1 F (36.7 C)  Recent Labs  Lab 04/13/20 0956  WBC 10.5  CREATININE 0.92    Estimated Creatinine Clearance: 53.4 mL/min (by C-G formula based on SCr of 0.92 mg/dL).    Allergies  Allergen Reactions  . Codeine Nausea And Vomiting  . Sulfa Antibiotics Other (See Comments)    STEVEN JOHNSON SYNDROME    Antimicrobials this admission: 2/25 ceftriaxone x 1 2/25 meropenem >>   Microbiology results: 2/25 UCx: pending    Thank you for allowing pharmacy to be a part of this patient's care.  3/25, PharmD, BCPS 04/13/2020 3:35 PM

## 2020-04-13 NOTE — ED Triage Notes (Signed)
Pt here with c/o right flank pain that began about 3 weeks ago, has a hx of kidney stones. Alert and oriented x4. States she is in a "great amount of pain." NAD.

## 2020-04-13 NOTE — Anesthesia Procedure Notes (Signed)
Procedure Name: Intubation Date/Time: 04/13/2020 5:11 PM Performed by: Nelda Marseille, CRNA Pre-anesthesia Checklist: Patient identified, Patient being monitored, Timeout performed, Emergency Drugs available and Suction available Patient Re-evaluated:Patient Re-evaluated prior to induction Oxygen Delivery Method: Circle system utilized Preoxygenation: Pre-oxygenation with 100% oxygen Induction Type: IV induction Ventilation: Mask ventilation without difficulty Laryngoscope Size: Mac, 3 and McGraph Grade View: Grade I Tube type: Oral Tube size: 7.0 mm Number of attempts: 1 Airway Equipment and Method: Stylet Placement Confirmation: ETT inserted through vocal cords under direct vision,  positive ETCO2 and breath sounds checked- equal and bilateral Secured at: 21 cm Tube secured with: Tape Dental Injury: Teeth and Oropharynx as per pre-operative assessment

## 2020-04-13 NOTE — Anesthesia Postprocedure Evaluation (Signed)
Anesthesia Post Note  Patient: BRIONA KORPELA  Procedure(s) Performed: CYSTOSCOPY WITH STENT PLACEMENT (Right )  Patient location during evaluation: PACU Anesthesia Type: General Level of consciousness: awake and alert Pain management: pain level controlled Vital Signs Assessment: post-procedure vital signs reviewed and stable Respiratory status: spontaneous breathing, nonlabored ventilation and respiratory function stable Cardiovascular status: blood pressure returned to baseline and stable Postop Assessment: no apparent nausea or vomiting Anesthetic complications: no   No complications documented.   Last Vitals:  Vitals:   04/13/20 1856 04/13/20 1941  BP: 132/66 120/69  Pulse: 89 96  Resp: 16 16  Temp: 36.9 C 37.2 C  SpO2: 97% 97%    Last Pain:  Vitals:   04/13/20 1800  TempSrc:   PainSc: 0-No pain                 Aurelio Brash Tallen Schnorr

## 2020-04-13 NOTE — ED Notes (Signed)
Report to Erica, RN

## 2020-04-13 NOTE — Consult Note (Addendum)
Urology Consult   I have been asked to see the patient by Dr. Clyde Lundborg, for evaluation and management of right ureteral stone with hydronephrosis and pyelonephritis.  Chief Complaint: Right flank pain, nausea vomiting  HPI:  Alison Price is a 75 y.o. year old with long history of recurrent stone disease who presents with 1 week of malaise, right-sided flank pain, nausea/vomiting, and dysuria.  Work-up in the ED with CT scan showed a 1 cm right proximal ureteral stone with upstream hydronephrosis, as well as a left-sided lower pole partial staghorn stone with no hydronephrosis on the left side.  Urinalysis was concerning for possible infection with 11-20 WBCs, 0-5 RBCs, few bacteria, nitrite positive, trace leukocytes.  Lab work otherwise relatively benign with WBC 10.5, creatinine 0.92.  PMH: Past Medical History:  Diagnosis Date  . Anemia   . Arthritis   . Chronic pain   . GERD (gastroesophageal reflux disease)   . Hypertension   . Osteoporosis   . Stevens-Johnson syndrome [facility])     Surgical History: Past Surgical History:  Procedure Laterality Date  . ABDOMINAL HYSTERECTOMY     partial  . BACK SURGERY    . ESOPHAGOGASTRODUODENOSCOPY N/A 06/30/2019   Procedure: ESOPHAGOGASTRODUODENOSCOPY (EGD);  Surgeon: Toledo, Boykin Nearing, MD;  Location: [facility] ENDOSCOPY;  Service: Gastroenterology;  Laterality: N/A;  . FOOT SURGERY    . HAND SURGERY     THUMB REPAIR  . HEMORRHOID SURGERY    . INTRAMEDULLARY (IM) NAIL INTERTROCHANTERIC Left 08/06/2017   Procedure: INTRAMEDULLARY (IM) NAIL INTERTROCHANTRIC;  Surgeon: Signa Kell, MD;  Location: [facility] ORS;  Service: Orthopedics;  Laterality: Left;  . POSTERIOR REPAIR     Allergies:  Allergies  Allergen Reactions  . Codeine Nausea And Vomiting  . Sulfa Antibiotics Other (See Comments)    Trudie Buckler SYNDROME    Family History: Family History  Problem Relation Age of Onset  . Diabetes Paternal Grandmother   . Uterine cancer  Maternal Aunt   . Stomach cancer Maternal Aunt   . Alcohol abuse Father   . Breast cancer Neg Hx   . Ovarian cancer Neg Hx   . Colon cancer Neg Hx   . Heart disease Neg Hx     Social History:  reports that she quit smoking about 34 years ago. She has never used smokeless tobacco. She reports that she does not drink alcohol and does not use drugs.  ROS: Negative aside from those stated in the HPI.  Physical Exam: BP (!) 187/152   Pulse 80   Temp 98.1 F (36.7 C) (Oral)   Resp 20   Ht 5\' 8"  (1.727 m)   Wt 63 kg   SpO2 98%   BMI 21.13 kg/m    Constitutional: Ill-appearing, in pain Cardiovascular: Regular rate and rhythm Respiratory: Clear to auscultation bilaterally GI: Abdomen is soft, nontender, nondistended, no abdominal masses GU: Right CVA tenderness  Laboratory Data: Reviewed, see HPI  Pertinent Imaging: I have personally reviewed the CT today showing a 1 cm right proximal ureteral stone with upstream hydronephrosis and findings concerning for upstream pyelonephritis, as well as a left lower pole partial staghorn stone with no left-sided hydronephrosis.  There also appears to be some small amount of gas in the collecting system on the right side, and early changes of renal abscess.  Assessment & Plan:   75 year old female with 1 week of malaise, right-sided flank pain, nausea/vomiting, and dysuria with CT showing a 1 cm right proximal ureteral  stone with laboratory and clinical signs of infection.  We discussed the need for drainage in the setting of an infected and obstructed system.  A ureteral stent is a small plastic tube that is placed cystoscopically with one end in the kidney and the other end in the bladder that allows the infection from the kidney to drain, and relieves pain from the obstructing stone.  We discussed the risks at length including bleeding, infection, sepsis, death, ureteral injury, and stent related symptoms including  urgency/frequency/dysuria/flank pain/gross hematuria.  There is a low, but not 0, risk of inability to pass the ureteral stent alongside the stone from below which would require percutaneous nephrostomy tube by interventional radiology.  Finally, we discussed possible prolonged hospitalization and recovery, possible temporary Foley catheter placement, and 10 to 14-day course of antibiotics.  We reviewed the need for a follow-up procedure for definitive management of their stone when the infection has been treated in 2 to 3 weeks with either ureteroscopy/laser lithotripsy, PCNL, or shockwave lithotripsy.   Recommendations: Admission to hospitalist service for broad-spectrum antibiotics Plan for urgent right ureteral stent placement this afternoon  Sondra Come, MD  Total time spent on the floor was 80 minutes, with greater than 50% spent in counseling and coordination of care with the patient regarding right ureteral stone and pyelonephritis  [facility] [address] [place], [place] [address] [phone]

## 2020-04-13 NOTE — Op Note (Signed)
Date of procedure: 04/13/20  Preoperative diagnosis:  1. Right proximal ureteral stone 2. Sepsis from urinary source  Postoperative diagnosis:  1. Same  Procedure: 1. Cystoscopy, right retrograde pyelogram with intraoperative interpretation, right ureteral stent placement  Surgeon: Legrand Rams, MD  Anesthesia: General  Complications: None  Intraoperative findings:  1.  No suspicious bladder lesions, mild cystitis cystica 2.  Uncomplicated right ureteral stent placement with purulent drainage  EBL: Minimal  Specimens: None  Drains: Right 6 French by 26 cm ureteral stent, 18 French Foley  Indication: Alison Price is a 75 y.o. patient with 1 cm right proximal ureteral stone with upstream hydronephrosis and urinalysis concerning for infection.  After reviewing the management options for treatment, they elected to proceed with the above surgical procedure(s). We have discussed the potential benefits and risks of the procedure, side effects of the proposed treatment, the likelihood of the patient achieving the goals of the procedure, and any potential problems that might occur during the procedure or recuperation. Informed consent has been obtained.  Description of procedure:  The patient was taken to the operating room and general anesthesia was induced. SCDs were placed for DVT prophylaxis. The patient was placed in the dorsal lithotomy position, prepped and draped in the usual sterile fashion, and preoperative antibiotics(ceftriaxone in the ED) were administered. A preoperative time-out was performed.   21 French rigid cystoscope was used to intubate the urethra and thorough cystoscopy was performed.  There was mild cystitis cystica but no suspicious lesions.  I started by performing a gentle retrograde pyelogram on the right side which showed a proximal ureteral stone with moderate hydronephrosis.  With the aid of the access catheter a sensor wire was advanced into the  collecting system.  A 6 French by 26 cm ureteral stent was uneventfully advanced over the wire into the kidney with an excellent curl in the midpole, as well as under direct vision in the bladder.  There was brisk drainage of purulent urine through the side ports of the stent and along the stent.  An 43 French Foley was placed to maximize drainage and 10 cc were placed in the balloon.  Disposition: Stable to PACU  Plan: Agree with adding meropenem in setting of prior ESBL E. Coli  Okay to remove Foley when clinically improved and afebrile > 24 hours We will arrange outpatient ureteroscopy in 2 to 3 weeks for definitive right-sided stone removal   Legrand Rams, MD

## 2020-04-13 NOTE — H&P (View-Only) (Signed)
Urology Consult   I have been asked to see the patient by Dr. Clyde Lundborg, for evaluation and management of right ureteral stone with hydronephrosis and pyelonephritis.  Chief Complaint: Right flank pain, nausea vomiting  HPI:  Alison Price is a 75 y.o. year old with long history of recurrent stone disease who presents with 1 week of malaise, right-sided flank pain, nausea/vomiting, and dysuria.  Work-up in the ED with CT scan showed a 1 cm right proximal ureteral stone with upstream hydronephrosis, as well as a left-sided lower pole partial staghorn stone with no hydronephrosis on the left side.  Urinalysis was concerning for possible infection with 11-20 WBCs, 0-5 RBCs, few bacteria, nitrite positive, trace leukocytes.  Lab work otherwise relatively benign with WBC 10.5, creatinine 0.92.  PMH: Past Medical History:  Diagnosis Date  . Anemia   . Arthritis   . Chronic pain   . GERD (gastroesophageal reflux disease)   . Hypertension   . Osteoporosis   . Stevens-Johnson syndrome [facility])     Surgical History: Past Surgical History:  Procedure Laterality Date  . ABDOMINAL HYSTERECTOMY     partial  . BACK SURGERY    . ESOPHAGOGASTRODUODENOSCOPY N/A 06/30/2019   Procedure: ESOPHAGOGASTRODUODENOSCOPY (EGD);  Surgeon: Toledo, Boykin Nearing, MD;  Location: [facility] ENDOSCOPY;  Service: Gastroenterology;  Laterality: N/A;  . FOOT SURGERY    . HAND SURGERY     THUMB REPAIR  . HEMORRHOID SURGERY    . INTRAMEDULLARY (IM) NAIL INTERTROCHANTERIC Left 08/06/2017   Procedure: INTRAMEDULLARY (IM) NAIL INTERTROCHANTRIC;  Surgeon: Signa Kell, MD;  Location: [facility] ORS;  Service: Orthopedics;  Laterality: Left;  . POSTERIOR REPAIR     Allergies:  Allergies  Allergen Reactions  . Codeine Nausea And Vomiting  . Sulfa Antibiotics Other (See Comments)    Trudie Buckler SYNDROME    Family History: Family History  Problem Relation Age of Onset  . Diabetes Paternal Grandmother   . Uterine cancer  Maternal Aunt   . Stomach cancer Maternal Aunt   . Alcohol abuse Father   . Breast cancer Neg Hx   . Ovarian cancer Neg Hx   . Colon cancer Neg Hx   . Heart disease Neg Hx     Social History:  reports that she quit smoking about 34 years ago. She has never used smokeless tobacco. She reports that she does not drink alcohol and does not use drugs.  ROS: Negative aside from those stated in the HPI.  Physical Exam: BP (!) 187/152   Pulse 80   Temp 98.1 F (36.7 C) (Oral)   Resp 20   Ht 5\' 8"  (1.727 m)   Wt 63 kg   SpO2 98%   BMI 21.13 kg/m    Constitutional: Ill-appearing, in pain Cardiovascular: Regular rate and rhythm Respiratory: Clear to auscultation bilaterally GI: Abdomen is soft, nontender, nondistended, no abdominal masses GU: Right CVA tenderness  Laboratory Data: Reviewed, see HPI  Pertinent Imaging: I have personally reviewed the CT today showing a 1 cm right proximal ureteral stone with upstream hydronephrosis and findings concerning for upstream pyelonephritis, as well as a left lower pole partial staghorn stone with no left-sided hydronephrosis.  There also appears to be some small amount of gas in the collecting system on the right side, and early changes of renal abscess.  Assessment & Plan:   75 year old female with 1 week of malaise, right-sided flank pain, nausea/vomiting, and dysuria with CT showing a 1 cm right proximal ureteral  stone with laboratory and clinical signs of infection.  We discussed the need for drainage in the setting of an infected and obstructed system.  A ureteral stent is a small plastic tube that is placed cystoscopically with one end in the kidney and the other end in the bladder that allows the infection from the kidney to drain, and relieves pain from the obstructing stone.  We discussed the risks at length including bleeding, infection, sepsis, death, ureteral injury, and stent related symptoms including  urgency/frequency/dysuria/flank pain/gross hematuria.  There is a low, but not 0, risk of inability to pass the ureteral stent alongside the stone from below which would require percutaneous nephrostomy tube by interventional radiology.  Finally, we discussed possible prolonged hospitalization and recovery, possible temporary Foley catheter placement, and 10 to 14-day course of antibiotics.  We reviewed the need for a follow-up procedure for definitive management of their stone when the infection has been treated in 2 to 3 weeks with either ureteroscopy/laser lithotripsy, PCNL, or shockwave lithotripsy.   Recommendations: Admission to hospitalist service for broad-spectrum antibiotics Plan for urgent right ureteral stent placement this afternoon  Zayleigh Stroh C Dov Dill, MD  Total time spent on the floor was 80 minutes, with greater than 50% spent in counseling and coordination of care with the patient regarding right ureteral stone and pyelonephritis  [facility] [address], [address] [place], [place] [address] [phone]   

## 2020-04-13 NOTE — ED Notes (Signed)
Pt placed on purewick 

## 2020-04-13 NOTE — ED Notes (Signed)
Report to OR RN.

## 2020-04-13 NOTE — Plan of Care (Signed)

## 2020-04-13 NOTE — H&P (Signed)
History and Physical    Alison Price UXL:[id number] DOB: February 14, 1946 DOA: 04/13/2020  Referring MD/NP/PA:   PCP: Jaclyn Shaggy, MD   Patient coming from:  The patient is coming from home.  At baseline, pt is independent for most of ADL.        Chief Complaint: right flank pain and abdominal pain, burning on urination  HPI: Alison Price is a 75 y.o. female with medical history significant of kidney stone, hypertension, GERD, depression, Stevens-Johnson syndrome, anemia, chronic pain syndrome, arthritis, UTI due to ESBL, who presents with right flank pain, abdominal pain.  Patient states that she has been having right flank pain for more than a week.  She states that since last night she started having nausea, vomiting, abdominal pain.  She has vomited several times with nonbilious and nonbloody vomiting.  Her abdominal pain is located in the middle and right side of her abdomen, constant, moderate, sharp, nonradiating.  Patient states that she has intermittent mild diarrhea, which has not changed.  No fever or chills.  She states that she has burning on urination, denies dysuria.  No chest pain, cough, shortness of breath.  No unilateral numbness or tingling in extremities.  ED Course: pt was found to have WBC 10.5, positive urinalysis (cloudy appearance, trace amount of leukocyte, positive nitrite, few bacteria, WBC 11-20), lipase 79, negative Covid PCR, troponin level 8, electrolytes renal function okay, temperature normal, blood pressure 140/86, 187/150, heart rate 80, RR 20, oxygen saturation 98% on room air. CT scan showed  9 mm right proximal ureteral stone with upstream hydronephrosis, as well as a left-sided lower pole partial staghorn stone with no hydronephrosis on the left side. Pt is admitted to MedSurg bed as inpatient.  Dr. Richardo Hanks of urology is consulted.  Review of Systems:   General: no fevers, chills, no body weight gain, has poor appetite, has fatigue HEENT: no  blurry vision, hearing changes or sore throat Respiratory: no dyspnea, coughing, wheezing CV: no chest pain, no palpitations GI: has nausea, vomiting, abdominal pain, diarrhea, no constipation GU: no dysuria, has burning on urination, no increased urinary frequency, hematuria  Ext: no leg edema Neuro: no unilateral weakness, numbness, or tingling, no vision change or hearing loss Skin: no rash, no skin tear. MSK: No muscle spasm, no deformity, no limitation of range of movement in spin. Has right flank pain Heme: No easy bruising.  Travel history: No recent long distant travel.  Allergy:  Allergies  Allergen Reactions  . Codeine Nausea And Vomiting  . Sulfa Antibiotics Other (See Comments)    Trudie Buckler SYNDROME    Past Medical History:  Diagnosis Date  . Anemia   . Arthritis   . Chronic pain   . GERD (gastroesophageal reflux disease)   . Hypertension   . Osteoporosis   . Stevens-Johnson syndrome [facility])     Past Surgical History:  Procedure Laterality Date  . ABDOMINAL HYSTERECTOMY     partial  . BACK SURGERY    . ESOPHAGOGASTRODUODENOSCOPY N/A 06/30/2019   Procedure: ESOPHAGOGASTRODUODENOSCOPY (EGD);  Surgeon: Toledo, Boykin Nearing, MD;  Location: [facility] ENDOSCOPY;  Service: Gastroenterology;  Laterality: N/A;  . FOOT SURGERY    . HAND SURGERY     THUMB REPAIR  . HEMORRHOID SURGERY    . INTRAMEDULLARY (IM) NAIL INTERTROCHANTERIC Left 08/06/2017   Procedure: INTRAMEDULLARY (IM) NAIL INTERTROCHANTRIC;  Surgeon: Signa Kell, MD;  Location: [facility] ORS;  Service: Orthopedics;  Laterality: Left;  . POSTERIOR REPAIR  Social History:  reports that she quit smoking about 34 years ago. She has never used smokeless tobacco. She reports that she does not drink alcohol and does not use drugs.  Family History:  Family History  Problem Relation Age of Onset  . Diabetes Paternal Grandmother   . Uterine cancer Maternal Aunt   . Stomach cancer Maternal Aunt   . Alcohol abuse  Father   . Breast cancer Neg Hx   . Ovarian cancer Neg Hx   . Colon cancer Neg Hx   . Heart disease Neg Hx      Prior to Admission medications   Medication Sig Start Date End Date Taking? Authorizing Provider  alum & mag hydroxide-simeth (MAALOX PLUS) 400-400-40 MG/5ML suspension Take 30 mLs by mouth every 4 (four) hours as needed for indigestion.     [provider]  amLODipine (NORVASC) 5 MG tablet Take 5 mg by mouth daily. 03/08/18   [provider]  butalbital-acetaminophen-caffeine (FIORICET, ESGIC) 50-325-40 MG tablet Take 1-2 tablets by mouth every 6 (six) hours as needed for headache or migraine.     [provider]  Cholecalciferol (VITAMIN D) 2000 units CAPS Take 6,000 Units by mouth daily.     [provider]  Choline Fenofibrate (FENOFIBRIC ACID) 135 MG CPDR Take 135 mg by mouth daily.     [provider]  dimenhyDRINATE (DRAMAMINE) 50 MG tablet Take 50 mg by mouth every 8 (eight) hours as needed for nausea.     [provider]  escitalopram (LEXAPRO) 5 MG tablet Take 5 mg by mouth daily. 03/08/18   [provider]  feeding supplement, ENSURE ENLIVE, (ENSURE ENLIVE) LIQD Take 237 mLs by mouth 3 (three) times daily between meals. 07/02/19   Alford Highland, MD  ferrous sulfate 325 (65 FE) MG tablet Take 325 mg by mouth 3 (three) times daily with meals.    [provider]  fexofenadine (ALLEGRA) 180 MG tablet Take 180 mg by mouth daily.    [provider]  fluticasone (FLONASE) 50 MCG/ACT nasal spray Place 2 sprays into both nostrils daily as needed for allergies or rhinitis.     [provider]  Lactulose 20 GM/30ML SOLN Take 30 mLs (20 g total) by mouth daily as needed (constipation). 07/02/19   Alford Highland, MD  lisinopril (ZESTRIL) 20 MG tablet Take 20 mg by mouth daily. 03/08/18   [provider]  meclizine (ANTIVERT) 25 MG tablet Take 25 mg by mouth 2 (two) times daily as needed  for dizziness.    [provider]  morphine (MS CONTIN) 60 MG 12 hr tablet Take 1 tablet (60 mg total) by mouth 3 (three) times daily. 07/02/19   Alford Highland, MD  Multiple Vitamin (MULTIVITAMIN) tablet Take 1 tablet by mouth daily.    [provider]  nystatin (MYCOSTATIN) 100000 UNIT/ML suspension Take 5 mLs (500,000 Units total) by mouth 4 (four) times daily. 07/02/19   Alford Highland, MD  ondansetron (ZOFRAN ODT) 4 MG disintegrating tablet Take 1 tablet (4 mg total) by mouth every 8 (eight) hours as needed for nausea or vomiting. 02/24/20   Chesley Noon, MD  Oxycodone HCl 10 MG TABS Take 10 mg by mouth 3 (three) times daily. 06/10/19   [provider]  pantoprazole (PROTONIX) 40 MG tablet Take 1 tablet (40 mg total) by mouth daily. 07/02/19 07/01/20  Alford Highland, MD  polyethylene glycol (MIRALAX / GLYCOLAX) 17 g packet Take 17 g by mouth daily. 06/13/18  Wieting, Richard, MD  potassium chloride (KLOR-CON) 10 MEQ tablet Take 1 tablet (10 mEq total) by mouth daily. 02/24/20   Chesley Noon, MD  senna-docusate (SENOKOT-S) 8.6-50 MG tablet Take 1 tablet by mouth 2 (two) times daily. 06/12/18   Alford Highland, MD  vitamin C (ASCORBIC ACID) 500 MG tablet Take 500 mg by mouth 3 (three) times daily. anemia- give with ferrous sulfate to potentiate iron absorption    [provider]    Physical Exam: Vitals:   04/13/20 1430 04/13/20 1500 04/13/20 1616 04/13/20 1731  BP: (!) 187/152 (!) 181/94 (!) 160/83 (!) 106/56  Pulse: 80 78 88 81  Resp: Temp:   99.5 F (37.5 C) 98.8 F (37.1 C)  TempSrc:      SpO2: 98% 98% 96% 100%  Weight:      Height:       General: Not in acute distress HEENT:       Eyes: PERRL, EOMI, no scleral icterus.       ENT: No discharge from the ears and nose, no pharynx injection, no tonsillar enlargement.        Neck: No JVD, no bruit, no mass felt. Heme: No neck lymph node enlargement. Cardiac: S1/S2, RRR, No  murmurs, No gallops or rubs. Respiratory: No rales, wheezing, rhonchi or rubs. GI: Soft, nondistended, has tenderness in right side of abdomen and middle abdomen, no rebound pain, no organomegaly, BS present. GU: No hematuria.  Positive right CVA tenderness Ext: No pitting leg edema bilaterally. 1+DP/PT pulse bilaterally. Musculoskeletal: No joint deformities, No joint redness or warmth, no limitation of ROM in spin. Skin: No rashes.  Neuro: Alert, oriented X3, cranial nerves II-XII grossly intact, moves all extremities normally.  Psych: Patient is not psychotic, no suicidal or hemocidal ideation.  Labs on Admission: I have personally reviewed following labs and imaging studies  CBC: Recent Labs  Lab 04/13/20 0956  WBC 10.5  HGB 10.3*  HCT 31.7*  MCV 93.0  PLT 196   Basic Metabolic Panel: Recent Labs  Lab 04/13/20 0956  NA 136  K 3.8  CL 100  CO2 28  GLUCOSE 142*  BUN 22  CREATININE 0.92  CALCIUM 9.7   GFR: Estimated Creatinine Clearance: 53.4 mL/min (by C-G formula based on SCr of 0.92 mg/dL). Liver Function Tests: Recent Labs  Lab 04/13/20 1030  AST 21  ALT 10  ALKPHOS 91  BILITOT 0.8  PROT 7.7  ALBUMIN 4.0   Recent Labs  Lab 04/13/20 1030  LIPASE 79*   No results for input(s): AMMONIA in the last 168 hours. Coagulation Profile: No results for input(s): INR, PROTIME in the last 168 hours. Cardiac Enzymes: No results for input(s): CKTOTAL, CKMB, CKMBINDEX, TROPONINI in the last 168 hours. BNP (last 3 results) No results for input(s): PROBNP in the last 8760 hours. HbA1C: No results for input(s): HGBA1C in the last 72 hours. CBG: No results for input(s): GLUCAP in the last 168 hours. Lipid Profile: No results for input(s): CHOL, HDL, LDLCALC, TRIG, CHOLHDL, LDLDIRECT in the last 72 hours. Thyroid Function Tests: No results for input(s): TSH, T4TOTAL, FREET4, T3FREE, THYROIDAB in the last 72 hours. Anemia Panel: No results for input(s): VITAMINB12,  FOLATE, FERRITIN, TIBC, IRON, RETICCTPCT in the last 72 hours. Urine analysis:    Component Value Date/Time   COLORURINE STRAW (A) 04/13/2020 1216   APPEARANCEUR CLEAR (A) 04/13/2020 1216   APPEARANCEUR Clear 04/17/2011 2348   LABSPEC 1.020 04/13/2020 1216  LABSPEC 1.010 04/17/2011 2348   PHURINE 7.0 04/13/2020 1216   GLUCOSEU NEGATIVE 04/13/2020 1216   GLUCOSEU Negative 04/17/2011 2348   HGBUR NEGATIVE 04/13/2020 1216   BILIRUBINUR NEGATIVE 04/13/2020 1216   BILIRUBINUR 3+ 06/26/2015 1204   BILIRUBINUR Negative 04/17/2011 2348   KETONESUR NEGATIVE 04/13/2020 1216   PROTEINUR NEGATIVE 04/13/2020 1216   UROBILINOGEN 0.2 06/26/2015 1204   NITRITE POSITIVE (A) 04/13/2020 1216   LEUKOCYTESUR TRACE (A) 04/13/2020 1216   LEUKOCYTESUR Negative 04/17/2011 2348   Sepsis Labs: (procalcitonin:4,lacticidven:4) ) Recent Results (from the past 240 hour(s))  Resp Panel by RT-PCR (Flu A&B, Covid) Nasopharyngeal Swab     Status: None   Collection Time: 04/13/20 12:16 PM   Specimen: Nasopharyngeal Swab; Nasopharyngeal(NP) swabs in vial transport medium  Result Value Ref Range Status   SARS Coronavirus 2 by RT PCR NEGATIVE NEGATIVE Final    Comment: (NOTE) SARS-CoV-2 target nucleic acids are NOT DETECTED.  The SARS-CoV-2 RNA is generally detectable in upper respiratory specimens during the acute phase of infection. The lowest concentration of SARS-CoV-2 viral copies this assay can detect is 138 copies/mL. A negative result does not preclude SARS-Cov-2 infection and should not be used as the sole basis for treatment or other patient management decisions. A negative result may occur with  improper specimen collection/handling, submission of specimen other than nasopharyngeal swab, presence of viral mutation(s) within the areas targeted by this assay, and inadequate number of viral copies(<138 copies/mL). A negative result must be combined with clinical observations, patient  history, and epidemiological information. The expected result is Negative.  Fact Sheet for Patients:  BloggerCourse.com  Fact Sheet for Healthcare Providers:  SeriousBroker.it  This test is no t yet approved or cleared by the [place] FDA and  has been authorized for detection and/or diagnosis of SARS-CoV-2 by FDA under an Emergency Use Authorization (EUA). This EUA will remain  in effect (meaning this test can be used) for the duration of the COVID-19 declaration under Section 564(b)(1) of the Act, 21 U.S.C.section 360bbb-3(b)(1), unless the authorization is terminated  or revoked sooner.       Influenza A by PCR NEGATIVE NEGATIVE Final   Influenza B by PCR NEGATIVE NEGATIVE Final    Comment: (NOTE) The Xpert Xpress SARS-CoV-2/FLU/RSV plus assay is intended as an aid in the diagnosis of influenza from Nasopharyngeal swab specimens and should not be used as a sole basis for treatment. Nasal washings and aspirates are unacceptable for Xpert Xpress SARS-CoV-2/FLU/RSV testing.  Fact Sheet for Patients: BloggerCourse.com  Fact Sheet for Healthcare Providers: SeriousBroker.it  This test is not yet approved or cleared by the [place] FDA and has been authorized for detection and/or diagnosis of SARS-CoV-2 by FDA under an Emergency Use Authorization (EUA). This EUA will remain in effect (meaning this test can be used) for the duration of the COVID-19 declaration under Section 564(b)(1) of the Act, 21 U.S.C. section 360bbb-3(b)(1), unless the authorization is terminated or revoked.  Performed at [facility], [address], [place], [place] [address]      Radiological Exams on Admission: CT ABDOMEN PELVIS W CONTRAST  Result Date: 04/13/2020 CLINICAL DATA:  Abdominal pain. Complains of RIGHT kidney pain for 2 days. Nausea and vomiting. EXAM: CT ABDOMEN AND  PELVIS WITH CONTRAST TECHNIQUE: Multidetector CT imaging of the abdomen and pelvis was performed using the standard protocol following bolus administration of intravenous contrast. CONTRAST:  OMNIPAQUE IOHEXOL 300 MG/ML  SOLN COMPARISON:  CT of the abdomen and pelvis  on 02/24/2020 FINDINGS: Lower chest: Patchy areas of consolidation are identified within the LOWER lobes bilaterally. The appearance is similar to prior study. Heart is enlarged. There is mitral annulus calcification. Trace pericardial effusion partially imaged. Hepatobiliary: No focal liver abnormality is seen. No biliary dilatation, or pericholecystic inflammatory changes. Gallbladder is mildly distended. Radiopaque 2 millimeter calculus is identified in the inferior aspect of the gallbladder. Pancreas: Unremarkable. No pancreatic ductal dilatation or surrounding inflammatory changes. Spleen: Normal in size without focal abnormality. Adrenals/Urinary Tract: Adrenal glands are normal. RIGHT kidney: There is moderate RIGHT hydronephrosis. A calculus measuring 9 millimeters is identified in the RIGHT ureteropelvic junction on image 36 of series 2. there is delayed excretion in the RIGHT kidney. RIGHT kidney is enlarged and there is significant perinephric stranding and fluid in the RIGHT retroperitoneum, without evidence for abscess or contrast extravasation. Numerous cysts now demonstrate low-attenuation peripherally, consistent with focal nephritis. LEFT kidney: Stable 2 centimeter staghorn calculus identified in the LOWER pole of the LEFT kidney. Numerous LEFT renal cysts. No LEFT-sided hydronephrosis. LEFT ureter is unremarkable. The urinary bladder contains a small amount of air and is otherwise unremarkable. Stomach/Bowel: The stomach and small bowel loops are normal in appearance. Colon is unremarkable. Vascular/Lymphatic: There is atherosclerotic calcification of the abdominal aorta, not associated with aneurysm. Reproductive: Prior  hysterectomy. Other: No ascites.  Anterior abdominal wall is unremarkable. Musculoskeletal: Prior lumbar fusion. Remote superior endplate fractures of T11, L1, L3. IMPRESSION: 1. Obstructing 9 millimeters calculus in the RIGHT ureteropelvic junction associated with moderate RIGHT hydronephrosis, and perinephric inflammatory changes consistent with pyelonephritis. Significant increase in retroperitoneal fluid, favoring infectious process. Recommend urology consult. 2. Interval change in the appearance of numerous RIGHT renal cysts, suggesting possible superinfection/focal nephritis. 3. 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 4. Cholelithiasis. 5. Cardiomegaly and mitral annulus calcification. 6. Trace pericardial effusion. 7. Prior lumbar fusion. 8. Remote superior endplate fractures of T11, L1, L3. 9. Aortic Atherosclerosis (ICD10-I70.0). These results were called by telephone at the time of interpretation on 04/13/2020 at 1:11 pm to provider [facility]AD[facility] , who verbally acknowledged these results. Electronically Signed   By: Norva PavlovElizabeth  Brown M.D.   On: 04/13/2020 13:13   DG OR UROLOGY CYSTO IMAGE ([facility] ONLY)  Result Date: 04/13/2020 There is no interpretation for this exam.  This order is for images obtained during a surgical procedure.  Please See "Surgeries" Tab for more information regarding the procedure.     EKG: Not done in ED, will get one.   Assessment/Plan Principal Problem:   Acute pyelonephritis Active Problems:   Essential hypertension   Gastroesophageal reflux disease without esophagitis   Obstructive uropathy   Normocytic anemia   Depression   Chronic pain   Acute pyelonephritis and obstructive uropathy: Dr. Richardo HanksSninsky of urology is consulted --> right ureteral stent placement.  -will admit to MedSurg bed as inpatient -Meropenem IV -Follow-up of blood culture and urine culture  Essential hypertension -IV hydralazine as needed -Amlodipine,  lisinopril  Gastroesophageal reflux disease without esophagitis -Protonix  Normocytic anemia: Hemoglobin 10.3 (11.9 only 1/60/22), slightly dropped.  No active bleeding -Follow-up with CBC -Continue iron supplement  Depression -Continue home medications  Chronic pain -Continue home oxycodone and MS Contin, with reduced dose    DVT ppx: SCD Code Status: Full code Family Communication: I have tried to call her daughter twice without success.   Disposition Plan:  Anticipate discharge back to previous environment Consults called: Dr. Richardo HanksSninsky of urology Admission status and  Level of care: Med-Surg:    Med-surg bed as inpt      Status is: Inpatient  Remains inpatient appropriate because:Inpatient level of care appropriate due to severity of illness   Dispo: The patient is from: Home              Anticipated d/c is to: Home              Patient currently is not medically stable to d/c.   Difficult to place patient No          Date of Service 04/13/2020    Lorretta Harp [facility]   If 7PM-7AM, please contact night-coverage [email or web address] 04/13/2020, 6:02 PM

## 2020-04-13 NOTE — Transfer of Care (Signed)
Immediate Anesthesia Transfer of Care Note  Patient: [facility]  Procedure(s) Performed: CYSTOSCOPY WITH STENT PLACEMENT (Right )  Patient Location: PACU  Anesthesia Type:General  Level of Consciousness: awake, alert  and oriented  Airway & Oxygen Therapy: Patient Spontanous Breathing and Patient connected to face mask oxygen  Post-op Assessment: Report given to RN and Post -op Vital signs reviewed and stable  Post vital signs: Reviewed and stable  Last Vitals:  Vitals Value Taken Time  BP 106/56 04/13/20 1731  Temp    Pulse 85 04/13/20 1737  Resp    SpO2 97 % 04/13/20 1737  Vitals shown include unvalidated device data.  Last Pain:  Vitals:   04/13/20 1616  TempSrc:   PainSc: 9          Complications: No complications documented.

## 2020-04-13 NOTE — ED Notes (Signed)
Here via EMS c/o right kidney pain x2 days, N, V as well. NAD.

## 2020-04-13 NOTE — ED Provider Notes (Signed)
[facility] Emergency Department Provider Note  Time seen: 11:03 AM  I have reviewed the triage vital signs and the nursing notes.   HISTORY  Chief Complaint Abdominal pain, nausea vomiting  HPI URVI IMES is a 75 y.o. female with a past medical history of anemia, arthritis, gastric reflux, hypertension, presents to the emergency department for nausea vomiting abdominal pain.  According to the patient since last night she has been nauseated with frequent episodes of vomiting, one episode of loose stool.  States moderate aching pain across her abdomen more so on the right side.  Subjective fever last night.   No dysuria.  No cough or shortness of breath.  Past Medical History:  Diagnosis Date  . Anemia   . Arthritis   . Chronic pain   . GERD (gastroesophageal reflux disease)   . Hypertension   . Osteoporosis   . Stevens-Johnson syndrome [facility])     Patient Active Problem List   Diagnosis Date Noted  . Protein-calorie malnutrition, severe 07/01/2019  . Other chronic pain   . Urinary tract infection due to extended-spectrum beta lactamase (ESBL) producing Escherichia coli   . Weakness   . Constipation   . Abdominal pain 06/29/2019  . Hypomagnesemia   . Gastroesophageal reflux disease without esophagitis   . Hip fx (HCC) 08/05/2017  . Nausea and vomiting 10/23/2016  . Hypokalemia 10/23/2016  . Dehydration 10/23/2016  . Essential hypertension 10/23/2016  . History of total abdominal hysterectomy 06/26/2015  . Pelvic pressure in female 06/26/2015  . Vaginal atrophy 06/26/2015  . Hematuria 06/26/2015  . Dysuria 06/26/2015  . Pancytopenia (HCC) 04/13/2015    Past Surgical History:  Procedure Laterality Date  . ABDOMINAL HYSTERECTOMY     partial  . BACK SURGERY    . ESOPHAGOGASTRODUODENOSCOPY N/A 06/30/2019   Procedure: ESOPHAGOGASTRODUODENOSCOPY (EGD);  Surgeon: Toledo, Boykin Nearing, MD;  Location: [facility] ENDOSCOPY;  Service: Gastroenterology;   Laterality: N/A;  . FOOT SURGERY    . HAND SURGERY     THUMB REPAIR  . HEMORRHOID SURGERY    . INTRAMEDULLARY (IM) NAIL INTERTROCHANTERIC Left 08/06/2017   Procedure: INTRAMEDULLARY (IM) NAIL INTERTROCHANTRIC;  Surgeon: Signa Kell, MD;  Location: [facility] ORS;  Service: Orthopedics;  Laterality: Left;  . POSTERIOR REPAIR      Prior to Admission medications   Medication Sig Start Date End Date Taking? Authorizing Provider  alum & mag hydroxide-simeth (MAALOX PLUS) 400-400-40 MG/5ML suspension Take 30 mLs by mouth every 4 (four) hours as needed for indigestion.     [provider]  amLODipine (NORVASC) 5 MG tablet Take 5 mg by mouth daily. 03/08/18   [provider]  butalbital-acetaminophen-caffeine (FIORICET, ESGIC) 50-325-40 MG tablet Take 1-2 tablets by mouth every 6 (six) hours as needed for headache or migraine.     [provider]  Cholecalciferol (VITAMIN D) 2000 units CAPS Take 6,000 Units by mouth daily.     [provider]  Choline Fenofibrate (FENOFIBRIC ACID) 135 MG CPDR Take 135 mg by mouth daily.     [provider]  dimenhyDRINATE (DRAMAMINE) 50 MG tablet Take 50 mg by mouth every 8 (eight) hours as needed for nausea.     [provider]  escitalopram (LEXAPRO) 5 MG tablet Take 5 mg by mouth daily. 03/08/18   [provider]  feeding supplement, ENSURE ENLIVE, (ENSURE ENLIVE) LIQD Take 237 mLs by mouth 3 (three) times daily between meals. 07/02/19   Alford Highland, MD  ferrous sulfate 325 (  65 FE) MG tablet Take 325 mg by mouth 3 (three) times daily with meals.    [provider]  fexofenadine (ALLEGRA) 180 MG tablet Take 180 mg by mouth daily.    [provider]  fluticasone (FLONASE) 50 MCG/ACT nasal spray Place 2 sprays into both nostrils daily as needed for allergies or rhinitis.     [provider]  Lactulose 20 GM/30ML SOLN Take 30 mLs (20 g total) by mouth daily as needed (constipation).  07/02/19   Alford Highland, MD  lisinopril (ZESTRIL) 20 MG tablet Take 20 mg by mouth daily. 03/08/18   [provider]  meclizine (ANTIVERT) 25 MG tablet Take 25 mg by mouth 2 (two) times daily as needed for dizziness.    [provider]  morphine (MS CONTIN) 60 MG 12 hr tablet Take 1 tablet (60 mg total) by mouth 3 (three) times daily. 07/02/19   Alford Highland, MD  Multiple Vitamin (MULTIVITAMIN) tablet Take 1 tablet by mouth daily.    [provider]  nystatin (MYCOSTATIN) 100000 UNIT/ML suspension Take 5 mLs (500,000 Units total) by mouth 4 (four) times daily. 07/02/19   Alford Highland, MD  ondansetron (ZOFRAN ODT) 4 MG disintegrating tablet Take 1 tablet (4 mg total) by mouth every 8 (eight) hours as needed for nausea or vomiting. 02/24/20   Chesley Noon, MD  Oxycodone HCl 10 MG TABS Take 10 mg by mouth 3 (three) times daily. 06/10/19   [provider]  pantoprazole (PROTONIX) 40 MG tablet Take 1 tablet (40 mg total) by mouth daily. 07/02/19 07/01/20  Alford Highland, MD  polyethylene glycol (MIRALAX / GLYCOLAX) 17 g packet Take 17 g by mouth daily. 06/13/18   Alford Highland, MD  potassium chloride (KLOR-CON) 10 MEQ tablet Take 1 tablet (10 mEq total) by mouth daily. 02/24/20   Chesley Noon, MD  senna-docusate (SENOKOT-S) 8.6-50 MG tablet Take 1 tablet by mouth 2 (two) times daily. 06/12/18   Alford Highland, MD  vitamin C (ASCORBIC ACID) 500 MG tablet Take 500 mg by mouth 3 (three) times daily. anemia- give with ferrous sulfate to potentiate iron absorption    [provider]    Allergies  Allergen Reactions  . Codeine Nausea And Vomiting  . Sulfa Antibiotics Other (See Comments)    STEVEN JOHNSON SYNDROME    Family History  Problem Relation Age of Onset  . Diabetes Paternal Grandmother   . Uterine cancer Maternal Aunt   . Stomach cancer Maternal Aunt   . Alcohol abuse Father   . Breast cancer Neg Hx   . Ovarian cancer Neg Hx   .  Colon cancer Neg Hx   . Heart disease Neg Hx     Social History Social History   Tobacco Use  . Smoking status: Former Smoker    Quit date: 02/27/1986    Years since quitting: 34.1  . Smokeless tobacco: Never Used  Vaping Use  . Vaping Use: Never used  Substance Use Topics  . Alcohol use: No  . Drug use: No    Review of Systems Constitutional: Subjective fever last night Cardiovascular: Negative for chest pain. Respiratory: Negative for shortness of breath. Gastrointestinal: Diffuse abdominal pain, moderate more so on the right side which she relates to vomiting. Genitourinary: Negative for urinary compaints Musculoskeletal: Negative for musculoskeletal complaints Skin: Negative for skin complaints  Neurological: Negative for headache All other ROS negative  ____________________________________________   PHYSICAL EXAM:  VITAL SIGNS: ED Triage Vitals  Enc Vitals Group  BP 04/13/20 1003 140/86     Pulse Rate 04/13/20 1001 60     Resp 04/13/20 1001 20     Temp 04/13/20 1001 98.1 F (36.7 C)     Temp Source 04/13/20 1001 Oral     SpO2 04/13/20 1001 99 %     Weight 04/13/20 1001 138 lb (62.6 kg)     Height 04/13/20 1001 5\' 8"  (1.727 m)     Head Circumference --      Peak Flow --      Pain Score 04/13/20 1022 10     Pain Loc --      Pain Edu? --      Excl. in GC? --    Constitutional: Alert and oriented. Well appearing and in no distress. Eyes: Normal exam ENT      Head: Normocephalic and atraumatic.      Mouth/Throat: Mucous membranes are moist. Cardiovascular: Normal rate, regular rhythm. Respiratory: Normal respiratory effort without tachypnea nor retractions. Breath sounds are clear  Gastrointestinal: Soft, mild diffuse tenderness to palpation, possibly slightly more so on the right side than left side.  No rebound guarding or distention. Musculoskeletal: Nontender with normal range of motion in all extremities.  Neurologic:  Normal speech and language.  No gross focal neurologic deficits  Skin:  Skin is warm, dry and intact.  Psychiatric: Mood and affect are normal.  ____________________________________________     RADIOLOGY  CT shows obstructing 9 mm right UPJ stone with hydronephrosis perinephric stranding consistent with pyelonephritis.  Retroperitoneal fluid consistent with infectious process.  ____________________________________________   INITIAL IMPRESSION / ASSESSMENT AND PLAN / ED COURSE  Pertinent labs & imaging results that were available during my care of the patient were reviewed by me and considered in my medical decision making (see chart for details).   Patient presents emergency department for nausea vomiting and abdominal pain.  Differential is quite broad but would include gastritis, gastroenteritis, intra-abdominal infection, UTI, Covid, SBO.  We will check labs, IV hydrate, treat nausea.  We will proceed with CT imaging the abdomen/pelvis as well as a urine sample and Covid swab.  Patient agreeable to plan of care.  CT shows 9 mm obstructing stone with signs of pyelonephritis.  Urinalysis is nitrite positive.  Urine culture has been sent we will start the patient on IV Rocephin.  I called Dr. 04/15/20 who is currently in the operating room.  He will evaluate once out of the operating room.  We will admit to the hospital service for further work-up and treatment.  Annalaura Sauseda Buske was evaluated in Emergency Department on 04/13/2020 for the symptoms described in the history of present illness. She was evaluated in the context of the global COVID-19 pandemic, which necessitated consideration that the patient might be at risk for infection with the SARS-CoV-2 virus that causes COVID-19. Institutional protocols and algorithms that pertain to the evaluation of patients at risk for COVID-19 are in a state of rapid change based on information released by regulatory bodies including the CDC and federal and state organizations.  These policies and algorithms were followed during the patient's care in the ED.  ____________________________________________   FINAL CLINICAL IMPRESSION(S) / ED DIAGNOSES  Abdominal pain Nausea vomiting Pyelonephritis Kidney stone   04/15/2020, MD 04/13/20 1425

## 2020-04-13 NOTE — Anesthesia Preprocedure Evaluation (Addendum)
Anesthesia Evaluation  Patient identified by MRN, date of birth, ID band Patient awake    Reviewed: Allergy & Precautions, H&P , NPO status , Patient's Chart, lab work & pertinent test results  History of Anesthesia Complications Negative for: history of anesthetic complications  Airway Mallampati: II  TM Distance: >3 FB     Dental  (+) Missing, Edentulous Lower   Pulmonary neg sleep apnea, neg COPD, former smoker,    breath sounds clear to auscultation       Cardiovascular hypertension, (-) angina(-) Past MI and (-) Cardiac Stents (-) dysrhythmias  Rate:Normal     Neuro/Psych PSYCHIATRIC DISORDERS Depression negative neurological ROS     GI/Hepatic Neg liver ROS, GERD  Controlled,  Endo/Other  negative endocrine ROS  Renal/GU Renal diseaseObstructing UPJ stone, presenting for stent placement     Musculoskeletal   Abdominal   Peds  Hematology  (+) Blood dyscrasia, anemia , Hgb 10.3   Anesthesia Other Findings Vomiting in pre-op  Past Medical History: No date: Anemia No date: Arthritis No date: Chronic pain No date: GERD (gastroesophageal reflux disease) No date: Hypertension No date: Osteoporosis No date: Stevens-Johnson syndrome (HCC)  Past Surgical History: No date: ABDOMINAL HYSTERECTOMY     Comment:  partial No date: BACK SURGERY 06/30/2019: ESOPHAGOGASTRODUODENOSCOPY; N/A     Comment:  Procedure: ESOPHAGOGASTRODUODENOSCOPY (EGD);  Surgeon:               Toledo, Boykin Nearing, MD;  Location: [facility] ENDOSCOPY;                Service: Gastroenterology;  Laterality: N/A; No date: FOOT SURGERY No date: HAND SURGERY     Comment:  THUMB REPAIR No date: HEMORRHOID SURGERY 08/06/2017: INTRAMEDULLARY (IM) NAIL INTERTROCHANTERIC; Left     Comment:  Procedure: INTRAMEDULLARY (IM) NAIL INTERTROCHANTRIC;                Surgeon: Signa Kell, MD;  Location: [facility] ORS;  Service:              Orthopedics;  Laterality:  Left; No date: POSTERIOR REPAIR  BMI    Body Mass Index: 21.13 kg/m      Reproductive/Obstetrics negative OB ROS                            Anesthesia Physical Anesthesia Plan  ASA: III  Anesthesia Plan: General ETT   Post-op Pain Management:    Induction:   PONV Risk Score and Plan: Ondansetron, Dexamethasone, Midazolam and Treatment may vary due to age or medical condition  Airway Management Planned:   Additional Equipment:   Intra-op Plan:   Post-operative Plan:   Informed Consent: I have reviewed the patients History and Physical, chart, labs and discussed the procedure including the risks, benefits and alternatives for the proposed anesthesia with the patient or authorized representative who has indicated his/her understanding and acceptance.     Dental Advisory Given  Plan Discussed with: Anesthesiologist, CRNA and Surgeon  Anesthesia Plan Comments:        Anesthesia Quick Evaluation

## 2020-04-14 ENCOUNTER — Encounter: Payer: Self-pay | Admitting: Urology

## 2020-04-14 DIAGNOSIS — A419 Sepsis, unspecified organism: Secondary | ICD-10-CM

## 2020-04-14 LAB — BLOOD CULTURE ID PANEL (REFLEXED) - BCID2

## 2020-04-14 LAB — BASIC METABOLIC PANEL
Anion gap: 10 (ref 5–15)
BUN: 26 mg/dL — ABNORMAL HIGH (ref 8–23)
CO2: 26 mmol/L (ref 22–32)
Calcium: 9.2 mg/dL (ref 8.9–10.3)
Chloride: 98 mmol/L (ref 98–111)
Creatinine, Ser: 1.17 mg/dL — ABNORMAL HIGH (ref 0.44–1.00)
GFR, Estimated: 49 mL/min — ABNORMAL LOW (ref >60)
Glucose, Bld: 128 mg/dL — ABNORMAL HIGH (ref 70–99)
Potassium: 4 mmol/L (ref 3.5–5.1)
Sodium: 134 mmol/L — ABNORMAL LOW (ref 135–145)

## 2020-04-14 LAB — CBC
HCT: 29 % — ABNORMAL LOW (ref 36.0–46.0)
Hemoglobin: 9.7 g/dL — ABNORMAL LOW (ref 12.0–15.0)
MCH: 30.5 pg (ref 26.0–34.0)
MCHC: 33.4 g/dL (ref 30.0–36.0)
MCV: 91.2 fL (ref 80.0–100.0)
Platelets: 148 10*3/uL — ABNORMAL LOW (ref 150–400)
RBC: 3.18 MIL/uL — ABNORMAL LOW (ref 3.87–5.11)
RDW: 15.6 % — ABNORMAL HIGH (ref 11.5–15.5)
WBC: 17.1 10*3/uL — ABNORMAL HIGH (ref 4.0–10.5)
nRBC: 0 % (ref 0.0–0.2)

## 2020-04-14 MED ORDER — SODIUM CHLORIDE 0.9 % IV SOLN
1.0000 g | Freq: Two times a day (BID) | INTRAVENOUS | Status: AC
  Administered 2020-04-14 – 2020-04-16 (×5): 1 g via INTRAVENOUS
  Filled 2020-04-14 (×6): qty 1

## 2020-04-14 MED ORDER — CHLORHEXIDINE GLUCONATE CLOTH 2 % EX PADS: 6.0000 | MEDICATED_PAD | Freq: Every day | CUTANEOUS | Status: DC

## 2020-04-14 NOTE — Consult Note (Signed)
PHARMACY - PHYSICIAN COMMUNICATION CRITICAL VALUE ALERT - BLOOD CULTURE IDENTIFICATION (BCID)  Alison Price is an 75 y.o. female who presented to [facility] on 04/13/2020 with a chief complaint of right flank pain and abdominal pain and burning on urination  Assessment:  3 out of 4 bottles postive for GNR. BCID positive for ESBL Ecoli. Potential source urine.   Name of physician (or Provider) Contacted: Dr. Allena Katz  Current antibiotics: meropenem 1 g q8H adjusted to 1 g q12H.   Changes to prescribed antibiotics recommended:  Patient is on recommended antibiotics - No changes needed  Results for orders placed or performed during the hospital encounter of 04/13/20  Blood Culture ID Panel (Reflexed) (Collected: 04/13/2020  6:50 PM)  Result Value Ref Range   Enterococcus faecalis NOT DETECTED NOT DETECTED   Enterococcus Faecium NOT DETECTED NOT DETECTED   Listeria monocytogenes NOT DETECTED NOT DETECTED   Staphylococcus species NOT DETECTED NOT DETECTED   Staphylococcus aureus (BCID) NOT DETECTED NOT DETECTED   Staphylococcus epidermidis NOT DETECTED NOT DETECTED   Staphylococcus lugdunensis NOT DETECTED NOT DETECTED   Streptococcus species NOT DETECTED NOT DETECTED   Streptococcus agalactiae NOT DETECTED NOT DETECTED   Streptococcus pneumoniae NOT DETECTED NOT DETECTED   Streptococcus pyogenes NOT DETECTED NOT DETECTED   A.calcoaceticus-baumannii NOT DETECTED NOT DETECTED   Bacteroides fragilis NOT DETECTED NOT DETECTED   Enterobacterales DETECTED (A) NOT DETECTED   Enterobacter cloacae complex NOT DETECTED NOT DETECTED   Escherichia coli DETECTED (A) NOT DETECTED   Klebsiella aerogenes NOT DETECTED NOT DETECTED   Klebsiella oxytoca NOT DETECTED NOT DETECTED   Klebsiella pneumoniae NOT DETECTED NOT DETECTED   Proteus species NOT DETECTED NOT DETECTED   Salmonella species NOT DETECTED NOT DETECTED   Serratia marcescens NOT DETECTED NOT DETECTED   Haemophilus influenzae NOT  DETECTED NOT DETECTED   Neisseria meningitidis NOT DETECTED NOT DETECTED   Pseudomonas aeruginosa NOT DETECTED NOT DETECTED   Stenotrophomonas maltophilia NOT DETECTED NOT DETECTED   Candida albicans NOT DETECTED NOT DETECTED   Candida auris NOT DETECTED NOT DETECTED   Candida glabrata NOT DETECTED NOT DETECTED   Candida krusei NOT DETECTED NOT DETECTED   Candida parapsilosis NOT DETECTED NOT DETECTED   Candida tropicalis NOT DETECTED NOT DETECTED   Cryptococcus neoformans/gattii NOT DETECTED NOT DETECTED   CTX-M ESBL DETECTED (A) NOT DETECTED   Carbapenem resistance IMP NOT DETECTED NOT DETECTED   Carbapenem resistance KPC NOT DETECTED NOT DETECTED   Carbapenem resistance NDM NOT DETECTED NOT DETECTED   Carbapenem resist OXA 48 LIKE NOT DETECTED NOT DETECTED   Carbapenem resistance VIM NOT DETECTED NOT DETECTED    Ronnald Ramp 04/14/2020  8:03 AM

## 2020-04-14 NOTE — Progress Notes (Signed)
[facility]  - [facility]   PATIENT NAME: Alison Price    MR#:  [id number]  DATE OF BIRTH:  October 10, 1945  SUBJECTIVE:  patient came in with right-sided flank pain. She was found to be septic and had a right ureteral stent placement. Overall doing well. She is worried about losing her earrings. Tells me staff has tried to look but haven't found no fever REVIEW OF SYSTEMS:   Review of Systems  Constitutional: Negative for chills, fever and weight loss.  HENT: Negative for ear discharge, ear pain and nosebleeds.   Eyes: Negative for blurred vision, pain and discharge.  Respiratory: Negative for sputum production, shortness of breath, wheezing and stridor.   Cardiovascular: Negative for chest pain, palpitations, orthopnea and PND.  Gastrointestinal: Negative for abdominal pain, diarrhea, nausea and vomiting.  Genitourinary: Negative for frequency and urgency.  Musculoskeletal: Negative for back pain and joint pain.  Neurological: Positive for weakness. Negative for sensory change, speech change and focal weakness.  Psychiatric/Behavioral: Negative for depression and hallucinations. The patient is not nervous/anxious.    Tolerating Diet:yes Tolerating PT:   DRUG ALLERGIES:   Allergies  Allergen Reactions  . Codeine Nausea And Vomiting  . Sulfa Antibiotics Other (See Comments)    STEVEN JOHNSON SYNDROME    VITALS:  Blood pressure (!) 125/58, pulse 70, temperature 99.5 F (37.5 C), resp. rate 17, height 5\' 8"  (1.727 m), weight 63 kg, SpO2 99 %.  PHYSICAL EXAMINATION:   Physical Exam  GENERAL:  75 y.o.-year-old patient lying in the bed with no acute distress.  LUNGS: Normal breath sounds bilaterally, no wheezing, rales, rhonchi. No use of accessory muscles of respiration.  CARDIOVASCULAR: S1, S2 normal. No murmurs, rubs, or gallops.  ABDOMEN: Soft, nontender, nondistended. Bowel sounds present. No organomegaly or mass.  EXTREMITIES: No cyanosis,  clubbing or edema b/l.    NEUROLOGIC: Cranial nerves II through XII are intact. No focal Motor or sensory deficits b/l.   PSYCHIATRIC:  patient is alert and oriented x 3.  SKIN: No obvious rash, lesion, or ulcer.   LABORATORY PANEL:  CBC Recent Labs  Lab 04/14/20 0532  WBC 17.1*  HGB 9.7*  HCT 29.0*  PLT 148*    Chemistries  Recent Labs  Lab 04/13/20 1030 04/14/20 0532  NA  --  134*  K  --  4.0  CL  --  98  CO2  --  26  GLUCOSE  --  128*  BUN  --  26*  CREATININE  --  1.17*  CALCIUM  --  9.2  AST 21  --   ALT 10  --   ALKPHOS 91  --   BILITOT 0.8  --    Cardiac Enzymes No results for input(s): TROPONINI in the last 168 hours. RADIOLOGY:  CT ABDOMEN PELVIS W CONTRAST  Result Date: 04/13/2020 CLINICAL DATA:  Abdominal pain. Complains of RIGHT kidney pain for 2 days. Nausea and vomiting. EXAM: CT ABDOMEN AND PELVIS WITH CONTRAST TECHNIQUE: Multidetector CT imaging of the abdomen and pelvis was performed using the standard protocol following bolus administration of intravenous contrast. CONTRAST:  04/15/2020 OMNIPAQUE IOHEXOL 300 MG/ML  SOLN COMPARISON:  CT of the abdomen and pelvis on 02/24/2020 FINDINGS: Lower chest: Patchy areas of consolidation are identified within the LOWER lobes bilaterally. The appearance is similar to prior study. Heart is enlarged. There is mitral annulus calcification. Trace pericardial effusion partially imaged. Hepatobiliary: No focal liver abnormality is seen. No biliary dilatation, or pericholecystic inflammatory  changes. Gallbladder is mildly distended. Radiopaque 2 millimeter calculus is identified in the inferior aspect of the gallbladder. Pancreas: Unremarkable. No pancreatic ductal dilatation or surrounding inflammatory changes. Spleen: Normal in size without focal abnormality. Adrenals/Urinary Tract: Adrenal glands are normal. RIGHT kidney: There is moderate RIGHT hydronephrosis. A calculus measuring 9 millimeters is identified in the RIGHT  ureteropelvic junction on image 36 of series 2. there is delayed excretion in the RIGHT kidney. RIGHT kidney is enlarged and there is significant perinephric stranding and fluid in the RIGHT retroperitoneum, without evidence for abscess or contrast extravasation. Numerous cysts now demonstrate low-attenuation peripherally, consistent with focal nephritis. LEFT kidney: Stable 2 centimeter staghorn calculus identified in the LOWER pole of the LEFT kidney. Numerous LEFT renal cysts. No LEFT-sided hydronephrosis. LEFT ureter is unremarkable. The urinary bladder contains a small amount of air and is otherwise unremarkable. Stomach/Bowel: The stomach and small bowel loops are normal in appearance. Colon is unremarkable. Vascular/Lymphatic: There is atherosclerotic calcification of the abdominal aorta, not associated with aneurysm. Reproductive: Prior hysterectomy. Other: No ascites.  Anterior abdominal wall is unremarkable. Musculoskeletal: Prior lumbar fusion. Remote superior endplate fractures of T11, L1, L3. IMPRESSION: 1. Obstructing 9 millimeters calculus in the RIGHT ureteropelvic junction associated with moderate RIGHT hydronephrosis, and perinephric inflammatory changes consistent with pyelonephritis. Significant increase in retroperitoneal fluid, favoring infectious process. Recommend urology consult. 2. Interval change in the appearance of numerous RIGHT renal cysts, suggesting possible superinfection/focal nephritis. 3. 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 4. Cholelithiasis. 5. Cardiomegaly and mitral annulus calcification. 6. Trace pericardial effusion. 7. Prior lumbar fusion. 8. Remote superior endplate fractures of T11, L1, L3. 9. Aortic Atherosclerosis (ICD10-I70.0). These results were called by telephone at the time of interpretation on 04/13/2020 at 1:11 pm to provider [facility] , who verbally acknowledged these results. Electronically Signed   By: Norva Pavlov M.D.    On: 04/13/2020 13:13   DG OR UROLOGY CYSTO IMAGE ([facility] ONLY)  Result Date: 04/13/2020 There is no interpretation for this exam.  This order is for images obtained during a surgical procedure.  Please See "Surgeries" Tab for more information regarding the procedure.   ASSESSMENT AND PLAN:  LAYLANIE KRUCZEK is a 75 y.o. female with medical history significant of kidney stone, hypertension, GERD, depression, Stevens-Johnson syndrome, anemia, chronic pain syndrome, arthritis, UTI due to ESBL, who presents with right flank pain, abdominal pain. Patient states that she has been having right flank pain for more than a week.  She states that since last night she started having nausea, vomiting, abdominal pain.    Sepsis due to ESBL e coli  Acute pyelonephritis and obstructive uropathy:  leukocytosis -- patient presented with tachycardia heart rate more than 90 white count of 17.1 and positive blood culture, abnormal CT abdomen consistent with  --Dr. Richardo Hanks of urology is consulted --> right ureteral stent placement.  --Meropenem IV --Follow-up of blood culture 3/4 bottles ESBL E. Coli -- remove Foley tomorrow  Essential hypertension -IV hydralazine as needed -Amlodipine, lisinopril  Gastroesophageal reflux disease without esophagitis -Protonix  Normocytic anemia: Hemoglobin 10.3 (11.9 only 1/60/22), slightly dropped.  No active bleeding -Continue iron supplement  Depression -Continue home medications  Chronic pain -Continue home oxycodone and MS Contin, with reduced dose    DVT ppx: SCD Code Status: Full code Family Communication:  none  disposition Plan:  Anticipate discharge back to previous environment Consults called: Dr. Richardo Hanks of urology Admission status and Level of care: Med-Surg:  Med-surg bed as inpt      Status is: Inpatient  Remains inpatient appropriate because:Inpatient level of care appropriate due to severity of illness   Dispo: The patient is  from: Home  Anticipated d/c is to: Home  Patient currently is not medically stable to d/c.              Difficult to place patient No continue IV antibiotics. Patient has ESBL E. coli and blood culture. Will discussed with ID regarding de-escalating antibiotic once sensitivities back           TOTAL TIME TAKING CARE OF THIS PATIENT: *25* minutes.  >50% time spent on counselling and coordination of care  Note: This dictation was prepared with Dragon dictation along with smaller phrase technology. Any transcriptional errors that result from this process are unintentional.  Enedina Finner M.D    [facility]   CC: Primary care physician; Jaclyn Shaggy, MDPatient ID: Alison Price, female   DOB: July 29, 1945, 88 y.o.   MRN: [id number]

## 2020-04-14 NOTE — Consult Note (Signed)
Pharmacy Antibiotic Note  Alison Price is a 75 y.o. female admitted on 04/13/2020 with acute pyelonephritis.  Pharmacy has been consulted for meropenem dosing. Hx of ESBL in urine culture on 06/2019. Blood cultures with 2/4 bottles (same set) GNR. BCID detected E coli (CTX-M gene detected).  Plan: Adjust meropenem to 1 g q12h   Height: 5\' 8"  (172.7 cm) Weight: 63 kg (139 lb) IBW/kg (Calculated) : 63.9  Temp (24hrs), Avg:98.7 F (37.1 C), Min:98.1 F (36.7 C), Max:99.5 F (37.5 C)  Recent Labs  Lab 04/13/20 0956 04/14/20 0532  WBC 10.5 17.1*  CREATININE 0.92 1.17*    Estimated Creatinine Clearance: 42 mL/min (A) (by C-G formula based on SCr of 1.17 mg/dL (H)).    Allergies  Allergen Reactions  . Codeine Nausea And Vomiting  . Sulfa Antibiotics Other (See Comments)    STEVEN JOHNSON SYNDROME    Antimicrobials this admission: 2/25 ceftriaxone x 1 2/25 meropenem >>   Microbiology results: 2/25 UCx: pending 2/25 BCx: 2/4 bottles (same set) GNR, BCID detected E coli (CTX-M gene detected)   Thank you for allowing pharmacy to be a part of this patient's care.  3/25 04/14/2020 8:01 AM

## 2020-04-15 NOTE — Progress Notes (Signed)
[facility]  - [place] at [facility]   PATIENT NAME: Alison Price    MR#:  [id number]  DATE OF BIRTH:  [birth year]  SUBJECTIVE:  patient came in with right-sided flank pain. She was found to be septic and had a right ureteral stent placement. Overall doing well. Had Tmax 102 on 2/26--none today Looks better REVIEW OF SYSTEMS:   Review of Systems  Constitutional: Negative for chills, fever and weight loss.  HENT: Negative for ear discharge, ear pain and nosebleeds.   Eyes: Negative for blurred vision, pain and discharge.  Respiratory: Negative for sputum production, shortness of breath, wheezing and stridor.   Cardiovascular: Negative for chest pain, palpitations, orthopnea and PND.  Gastrointestinal: Negative for abdominal pain, diarrhea, nausea and vomiting.  Genitourinary: Negative for frequency and urgency.  Musculoskeletal: Negative for back pain and joint pain.  Neurological: Positive for weakness. Negative for sensory change, speech change and focal weakness.  Psychiatric/Behavioral: Negative for depression and hallucinations. The patient is not nervous/anxious.    Tolerating Diet:yes Tolerating PT: pending  DRUG ALLERGIES:   Allergies  Allergen Reactions  . Codeine Nausea And Vomiting  . Sulfa Antibiotics Other (See Comments)    STEVEN JOHNSON SYNDROME    VITALS:  Blood pressure (!) 112/56, pulse 73, temperature 99.4 F (37.4 C), temperature source Oral, resp. rate 18, height 5\' 8"  (1.727 m), weight 63 kg, SpO2 96 %.  PHYSICAL EXAMINATION:   Physical Exam  GENERAL:  75 y.o.-year-old patient lying in the bed with no acute distress.  LUNGS: Normal breath sounds bilaterally, no wheezing, rales, rhonchi. No use of accessory muscles of respiration.  CARDIOVASCULAR: S1, S2 normal. No murmurs, rubs, or gallops.  ABDOMEN: Soft, nontender, nondistended. Bowel sounds present. No organomegaly or mass.  EXTREMITIES: No cyanosis, clubbing or edema b/l.     NEUROLOGIC: Cranial nerves II through XII are intact. No focal Motor or sensory deficits b/l.   PSYCHIATRIC:  patient is alert and oriented x 3.  SKIN: No obvious rash, lesion, or ulcer.   LABORATORY PANEL:  CBC Recent Labs  Lab 04/14/20 0532  WBC 17.1*  HGB 9.7*  HCT 29.0*  PLT 148*    Chemistries  Recent Labs  Lab 04/13/20 1030 04/14/20 0532  NA  --  134*  K  --  4.0  CL  --  98  CO2  --  26  GLUCOSE  --  128*  BUN  --  26*  CREATININE  --  1.17*  CALCIUM  --  9.2  AST 21  --   ALT 10  --   ALKPHOS 91  --   BILITOT 0.8  --    Cardiac Enzymes No results for input(s): TROPONINI in the last 168 hours. RADIOLOGY:  DG OR UROLOGY CYSTO IMAGE ([facility] ONLY)  Result Date: 04/13/2020 There is no interpretation for this exam.  This order is for images obtained during a surgical procedure.  Please See "Surgeries" Tab for more information regarding the procedure.   ASSESSMENT AND PLAN:  Alison Price is a 75 y.o. female with medical history significant of kidney stone, hypertension, GERD, depression, Stevens-Johnson syndrome, anemia, chronic pain syndrome, arthritis, UTI due to ESBL, who presents with right flank pain, abdominal pain. Patient states that she has been having right flank pain for more than a week.  She states that since last night she started having nausea, vomiting, abdominal pain.    Sepsis due to ESBL e coli  Acute pyelonephritis and obstructive uropathy:  leukocytosis -- patient presented with tachycardia heart rate more than 90 white count of 17.1 and positive blood culture, abnormal CT abdomen consistent with  --Dr. Richardo Hanks of urology is consulted --> right ureteral stent placement.  --Meropenem IV --Follow-up of blood culture 3/4 bottles ESBL E. Coli -- removed Foley  --2/25--tmax 102 --2/26--no fever. --2/27--BC and CBC for am. Doing well. ID consult placed for help with abxs  Essential hypertension -IV hydralazine as needed -Amlodipine,  lisinopril  Gastroesophageal reflux disease without esophagitis -Protonix  Normocytic anemia: Hemoglobin 10.3 (11.9 only 1/60/22), slightly dropped.  No active bleeding -Continue iron supplement  Depression -Continue home medications  Chronic pain -Continue home oxycodone and MS Contin, with reduced dose    DVT ppx: SCD Code Status: Full code Family Communication:  none  disposition Plan:  Anticipate discharge back to previous environment Consults called: Dr. Richardo Hanks of urology Admission status and Level of care: Med-Surg:    Med-surg bed as inpt      Status is: Inpatient  Remains inpatient appropriate because:Inpatient level of care appropriate due to severity of illness   Dispo: The patient is from: Home  Anticipated d/c is to: Home  Patient currently is not medically stable to d/c.              Difficult to place patient No continue IV antibiotics. Patient has ESBL E. coli and blood culture. Will discussed with ID regarding de-escalating antibiotic once sensitivities back           TOTAL TIME TAKING CARE OF THIS PATIENT: *25* minutes.  >50% time spent on counselling and coordination of care  Note: This dictation was prepared with Dragon dictation along with smaller phrase technology. Any transcriptional errors that result from this process are unintentional.  Alison Price M.D    [facility]   CC: Primary care physician; Alison Price, MDPatient ID: Alison Price, female   DOB: March 10, 1945, 75 y.o.   MRN: [id number]

## 2020-04-16 DIAGNOSIS — I10 Essential (primary) hypertension: Secondary | ICD-10-CM

## 2020-04-16 DIAGNOSIS — D649 Anemia, unspecified: Secondary | ICD-10-CM

## 2020-04-16 DIAGNOSIS — N10 Acute tubulo-interstitial nephritis: Secondary | ICD-10-CM | POA: Diagnosis not present

## 2020-04-16 DIAGNOSIS — B9620 Unspecified Escherichia coli [E. coli] as the cause of diseases classified elsewhere: Secondary | ICD-10-CM | POA: Diagnosis not present

## 2020-04-16 DIAGNOSIS — R7881 Bacteremia: Secondary | ICD-10-CM

## 2020-04-16 DIAGNOSIS — N390 Urinary tract infection, site not specified: Secondary | ICD-10-CM

## 2020-04-16 DIAGNOSIS — A498 Other bacterial infections of unspecified site: Secondary | ICD-10-CM

## 2020-04-16 DIAGNOSIS — Q6211 Congenital occlusion of ureteropelvic junction: Secondary | ICD-10-CM

## 2020-04-16 DIAGNOSIS — N201 Calculus of ureter: Secondary | ICD-10-CM

## 2020-04-16 DIAGNOSIS — Z1612 Extended spectrum beta lactamase (ESBL) resistance: Secondary | ICD-10-CM

## 2020-04-16 LAB — CBC
HCT: 25.4 % — ABNORMAL LOW (ref 36.0–46.0)
Hemoglobin: 8.6 g/dL — ABNORMAL LOW (ref 12.0–15.0)
MCH: 31.2 pg (ref 26.0–34.0)
MCHC: 33.9 g/dL (ref 30.0–36.0)
MCV: 92 fL (ref 80.0–100.0)
Platelets: 150 10*3/uL (ref 150–400)
RBC: 2.76 MIL/uL — ABNORMAL LOW (ref 3.87–5.11)
RDW: 15.2 % (ref 11.5–15.5)
WBC: 6.1 10*3/uL (ref 4.0–10.5)
nRBC: 0 % (ref 0.0–0.2)

## 2020-04-16 LAB — CULTURE, BLOOD (ROUTINE X 2)
Special Requests: ADEQUATE
Special Requests: ADEQUATE

## 2020-04-16 LAB — URINE CULTURE: Culture: >=100000 — AB

## 2020-04-16 MED ORDER — POLYETHYLENE GLYCOL 3350 17 G PO PACK
17.0000 g | PACK | Freq: Every day | ORAL | Status: DC
  Administered 2020-04-16: 17 g via ORAL
  Filled 2020-04-16 (×2): qty 1

## 2020-04-16 MED ORDER — SODIUM CHLORIDE 0.9 % IV SOLN
1.0000 g | INTRAVENOUS | Status: DC
  Administered 2020-04-17: 1000 mg via INTRAVENOUS
  Filled 2020-04-16 (×2): qty 1

## 2020-04-16 NOTE — Consult Note (Signed)
Pharmacy Antibiotic Note  Alison Price is a 75 y.o. female admitted on 04/13/2020 with acute pyelonephritis. Hx of ESBL in urine culture on 06/2019. Blood cultures with 2/4 bottles (same set) ESBL e. coli. CT concerning for urinary obstruction and stones. Pt underwent R ureteral stent placement on 2/25 which had purulent drainage. Pt to f/u with urology outpatient. Pharmacy has been consulted for meropenem dosing.  Plan: Continue meropenem to 1 g q12h  Monitor renal function   Height: 5\' 8"  (172.7 cm) Weight: 63 kg (139 lb) IBW/kg (Calculated) : 63.9  Temp (24hrs), Avg:98.6 F (37 C), Min:97.9 F (36.6 C), Max:99.3 F (37.4 C)  Recent Labs  Lab 04/13/20 0956 04/14/20 0532 04/16/20 0024  WBC 10.5 17.1* 6.1  CREATININE 0.92 1.17*  --     Estimated Creatinine Clearance: 42 mL/min (A) (by C-G formula based on SCr of 1.17 mg/dL (H)).    Allergies  Allergen Reactions  . Codeine Nausea And Vomiting  . Sulfa Antibiotics Other (See Comments)    STEVEN JOHNSON SYNDROME    Antimicrobials this admission: 2/25 ceftriaxone x 1 2/25 meropenem >>   Microbiology results: 2/25 UCx: pending 2/25 BCx: 2/4 bottles ESBL e. Coli 2/28 BCx: NGTD  Thank you for allowing pharmacy to be a part of this patient's care.  3/28, PharmD Pharmacy Resident  04/16/2020 2:31 PM

## 2020-04-16 NOTE — Progress Notes (Signed)
   Subjective Clinically improving, urine and blood cultures growing ESBL E. Coli Denies flank pain  Physical Exam: BP (!) 107/50 (BP Location: Left Arm)   Pulse 65   Temp 97.9 F (36.6 C) (Oral)   Resp 15   Ht 5\' 8"  (1.727 m)   Wt 63 kg   SpO2 98%   BMI 21.13 kg/m    Constitutional:  Alert and oriented, No acute distress. Respiratory: Normal respiratory effort, no increased work of breathing. GI: Abdomen is soft, non-tender, non-distended  Laboratory Data: Reviewed, leukocytosis resolved, cultures with ESBL E. coli and sensitivities pending   Assessment & Plan:   75 year old female admitted with a 1 cm right UPJ stone and upstream hydronephrosis with sepsis from urinary source.  Urine and blood cultures growing ESBL E. coli and sensitivities pending.  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  -Agree with ID consult for determination of duration of antibiotics and duration -Will arrange urology follow-up in 1 to 2 weeks to discuss follow-up ureteroscopy and timing of stone treatment after infection treated  67, MD

## 2020-04-16 NOTE — Consult Note (Signed)
[name]: Alison Price  DOB: August 21, 1945  MRN: [id number]  Date/Time: 04/16/2020 11:37 AM  REQUESTING PROVIDER: Dr. Allena KatzPatel Subjective:  REASON FOR CONSULT: ESBL E. coli bacteremia ? Alison LingoConstance A Noyes is a 75 y.o. 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 eSBl e.coli UTI in the past Admitted with Rt flank pain of > 1 week duration with N/V In the ED BP 120/63, Temp 98.2, HR 81, sats 97%. Labs showed WBC of 10.5, HB 10.3, PLT 196 and cr 0.92 CTabdomen showed 9mm calculus rt ureteopelvic junction associated with RT hydronephrosis and perinephric inflammatory changes.Interval change in the appearance of numerous RIGHT renal cysts, suggesting possible superinfection/focal nephritis. 3. 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 4. Cholelithiasis. Blood and urine culture sent and started on ceftriaxone. Underwent on 04/13/20 cystoscopy rt ureteral stent placement Culture positive for ESBL e.coli and she is now on meropenem and  Am seeing her for the same Past Medical History:  Diagnosis Date  . Anemia   . Arthritis   . Chronic pain   . GERD (gastroesophageal reflux disease)   . Hypertension   . Osteoporosis   . Stevens-Johnson syndrome [facility](HCC)     Past Surgical History:  Procedure Laterality Date  . ABDOMINAL HYSTERECTOMY     partial  . BACK SURGERY    . CYSTOSCOPY WITH STENT PLACEMENT Right 04/13/2020   Procedure: CYSTOSCOPY WITH STENT PLACEMENT;  Surgeon: Sondra ComeSninsky, Brian C, MD;  Location: [facility] ORS;  Service: Urology;  Laterality: Right;  . ESOPHAGOGASTRODUODENOSCOPY N/A 06/30/2019   Procedure: ESOPHAGOGASTRODUODENOSCOPY (EGD);  Surgeon: Toledo, Boykin Nearingeodoro K, MD;  Location: [facility] ENDOSCOPY;  Service: Gastroenterology;  Laterality: N/A;  . FOOT SURGERY    . HAND SURGERY     THUMB REPAIR  . HEMORRHOID SURGERY    . INTRAMEDULLARY (IM) NAIL INTERTROCHANTERIC Left 08/06/2017   Procedure:  INTRAMEDULLARY (IM) NAIL INTERTROCHANTRIC;  Surgeon: Signa KellPatel, Sunny, MD;  Location: [facility] ORS;  Service: Orthopedics;  Laterality: Left;  . POSTERIOR REPAIR      Social History   Socioeconomic History  . Marital status: Divorced    Spouse name: Not on file  . Number of children: Not on file  . Years of education: Not on file  . Highest education level: Not on file  Occupational History  . Not on file  Tobacco Use  . Smoking status: Former Smoker    Quit date: 02/27/1986    Years since quitting: 34.1  . Smokeless tobacco: Never Used  Vaping Use  . Vaping Use: Never used  Substance and Sexual Activity  . Alcohol use: No  . Drug use: No  . Sexual activity: Not Currently    Birth control/protection: Surgical, None  Other Topics Concern  . Not on file  Social History Narrative  . Not on file   Social Determinants of Health   Financial Resource Strain: Not on file  Food Insecurity: Not on file  Transportation Needs: Not on file  Physical Activity: Not on file  Stress: Not on file  Social Connections: Not on file  Intimate Partner Violence: Not on file    Family History  Problem Relation Age of Onset  . Diabetes Paternal Grandmother   . Uterine cancer Maternal Aunt   . Stomach cancer Maternal Aunt   . Alcohol abuse Father   . Breast cancer Neg Hx   . Ovarian cancer Neg Hx   . Colon cancer Neg  Hx   . Heart disease Neg Hx    Allergies  Allergen Reactions  . Codeine Nausea And Vomiting  . Sulfa Antibiotics Other (See Comments)    STEVEN JOHNSON SYNDROME   I? Current Facility-Administered Medications  Medication Dose Route Frequency Provider Last Rate Last Admin  . acetaminophen (TYLENOL) tablet 650 mg  650 mg Oral Q6H PRN Lorretta Harp, MD   650 mg at 04/14/20 1454  . alum & mag hydroxide-simeth (MAALOX/MYLANTA) 200-200-20 MG/5ML suspension 30 mL  30 mL Oral Q4H PRN Lorretta Harp, MD      . amLODipine (NORVASC) tablet 5 mg  5 mg Oral Daily Lorretta Harp, MD   5 mg at 04/16/20  1610  . butalbital-acetaminophen-caffeine (FIORICET) 50-325-40 MG per tablet 1-2 tablet  1-2 tablet Oral Q6H PRN Lorretta Harp, MD   1 tablet at 04/14/20 1338  . cholecalciferol (VITAMIN D3) tablet 6,000 Units  6,000 Units Oral Daily Lorretta Harp, MD   6,000 Units at 04/16/20 [phone]  . dimenhyDRINATE (DRAMAMINE) tablet 50 mg  50 mg Oral Q8H PRN Manuela Schwartz, NP   50 mg at 04/16/20 0906  . escitalopram (LEXAPRO) tablet 5 mg  5 mg Oral Daily Lorretta Harp, MD   5 mg at 04/16/20 5409  . feeding supplement (ENSURE ENLIVE / ENSURE PLUS) liquid 237 mL  237 mL Oral TID BM Lorretta Harp, MD   237 mL at 04/16/20 0907  . fenofibrate (TRICOR) tablet 145 mg  145 mg Oral Daily Lorretta Harp, MD   145 mg at 04/15/20 1348  . ferrous sulfate tablet 325 mg  325 mg Oral TID WC Lorretta Harp, MD   325 mg at 04/16/20 0905  . fluticasone (FLONASE) 50 MCG/ACT nasal spray 2 spray  2 spray Each Nare Daily PRN Lorretta Harp, MD      . hydrALAZINE (APRESOLINE) injection 5 mg  5 mg Intravenous Q2H PRN Lorretta Harp, MD   5 mg at 04/13/20 1555  . lactulose (CHRONULAC) 10 GM/15ML solution 20 g  20 g Oral Daily PRN Lorretta Harp, MD      . lisinopril (ZESTRIL) tablet 20 mg  20 mg Oral Daily Lorretta Harp, MD   20 mg at 04/16/20 8119  . loratadine (CLARITIN) tablet 10 mg  10 mg Oral Daily Lorretta Harp, MD   10 mg at 04/16/20 1478  . meropenem (MERREM) 1 g in sodium chloride 0.9 % 100 mL IVPB  1 g Intravenous Q12H Tressie Ellis, RPH 200 mL/hr at 04/16/20 0912 1 g at 04/16/20 0912  . morphine (MS CONTIN) 12 hr tablet 15 mg  15 mg Oral TID Lorretta Harp, MD   15 mg at 04/16/20 0907  . multivitamin with minerals tablet 1 tablet  1 tablet Oral Daily Lorretta Harp, MD   1 tablet at 04/16/20 0905  . ondansetron (ZOFRAN) injection 4 mg  4 mg Intravenous Q8H PRN Lorretta Harp, MD      . oxyCODONE (Oxy IR/ROXICODONE) immediate release tablet 5 mg  5 mg Oral TID Lorretta Harp, MD   5 mg at 04/16/20 2956  . pantoprazole (PROTONIX) EC tablet 40 mg  40 mg Oral Daily Lorretta Harp,  MD   40 mg at 04/16/20 2130  . polyethylene glycol (MIRALAX / GLYCOLAX) packet 17 g  17 g Oral Daily Lowella Bandy, RPH   17 g at 04/16/20 8657     Abtx:  Anti-infectives (From admission, onward)   Start     Dose/Rate Route Frequency Ordered Stop  04/14/20 2000  meropenem (MERREM) 1 g in sodium chloride 0.9 % 100 mL IVPB        1 g 200 mL/hr over 30 Minutes Intravenous Every 12 hours 04/14/20 0801     04/13/20 1600  meropenem (MERREM) 1 g in sodium chloride 0.9 % 100 mL IVPB  Status:  Discontinued        1 g 200 mL/hr over 30 Minutes Intravenous Every 8 hours 04/13/20 1535 04/14/20 0801   04/13/20 1315  cefTRIAXone (ROCEPHIN) 1 g in sodium chloride 0.9 % 100 mL IVPB        1 g 200 mL/hr over 30 Minutes Intravenous  Once 04/13/20 1313 04/13/20 1511      REVIEW OF SYSTEMS:  Const: negative fever, negative chills, negative weight loss Eyes: negative diplopia or visual changes, negative eye pain ENT: negative coryza, negative sore throat Resp: negative cough, hemoptysis, dyspnea Cards: negative for chest pain, palpitations, lower extremity edema GU: negative for frequency, dysuria and hematuria GI: Negative for abdominal pain, diarrhea, bleeding, constipation Skin: negative for rash and pruritus Heme: negative for easy bruising and gum/nose bleeding MS: negative for myalgias, arthralgias, back pain and muscle weakness Neurolo: Patient says her memory is not as good as before.  She cannot plan or arrange things like before. Psych: Anxiety Endocrine: negative for thyroid, diabetes Allergy/Immunology- Sulfa- SJS?  Objective:  VITALS:  BP 117/62 (BP Location: Right Arm)   Pulse 81   Temp 98.5 F (36.9 C) (Oral)   Resp 15   Ht  (1.727 m)   Wt 63 kg   SpO2 100%   BMI 21.13 kg/m  PHYSICAL EXAM:  General: Alert, cooperative, no distress, appears stated age.  Head: Normocephalic, without obvious abnormality, atraumatic. Eyes: Conjunctivae clear, anicteric sclerae. Pupils  are equal ENT Nares normal. No drainage or sinus tenderness. Lips, mucosa, and tongue normal. No Thrush Poor dentition Neck: Supple, symmetrical, no adenopathy, thyroid: non tender no carotid bruit and no JVD. Back: thoracic kyphosis Lungs: Clear to auscultation bilaterally. No Wheezing or Rhonchi. No rales. Heart: Regular rate and rhythm, no murmur, rub or gallop. Abdomen: Soft, non-tender,not distended. Bowel sounds normal. No masses Extremities: atraumatic, no cyanosis. No edema. No clubbing Skin: No rashes or lesions. Or bruising Lymph: Cervical, supraclavicular normal. Neurologic: Grossly non-focal Forgetful Disorganized in her thoughtd Pertinent Labs Lab Results CBC    Component Value Date/Time   WBC 6.1 04/16/2020 0024   RBC 2.76 (L) 04/16/2020 0024   HGB 8.6 (L) 04/16/2020 0024   HGB 10.9 (L) 05/04/2013 1120   HCT 25.4 (L) 04/16/2020 0024   HCT 31.2 (L) 03/29/2013 1340   PLT 150 04/16/2020 0024   PLT 155 03/29/2013 1340   MCV 92.0 04/16/2020 0024   MCV 90 03/29/2013 1340   MCH 31.2 04/16/2020 0024   MCHC 33.9 04/16/2020 0024   RDW 15.2 04/16/2020 0024   RDW 14.1 03/29/2013 1340   LYMPHSABS 0.9 06/10/2018 0306   LYMPHSABS 1.2 03/29/2013 1340   MONOABS 0.2 06/10/2018 0306   MONOABS 0.3 03/29/2013 1340   EOSABS 0.0 06/10/2018 0306   EOSABS 0.2 03/29/2013 1340   BASOSABS 0.0 06/10/2018 0306   BASOSABS 0.0 03/29/2013 1340    CMP Latest Ref Rng & Units 04/14/2020 04/13/2020 02/23/2020  Glucose 70 - 99 mg/dL 960(A) 540(J) 811(B)  BUN 8 - 23 mg/dL 14(N) 22 19  Creatinine 0.44 - 1.00 mg/dL 8.29(F) 6.21 3.08  Sodium 135 - 145 mmol/L 134(L) 136 134(L)  Potassium 3.5 -  5.1 mmol/L 4.0 3.8 2.8(L)  Chloride 98 - 111 mmol/L 98 100 97(L)  CO2 22 - 32 mmol/L Calcium 8.9 - 10.3 mg/dL 9.2 9.7 9.4  Total Protein 6.5 - 8.1 g/dL - 7.7 8.1  Total Bilirubin 0.3 - 1.2 mg/dL - 0.8 1.2  Alkaline Phos 38 - 126 U/L - 91 77  AST 15 - 41 U/L - 21 23  ALT 0 - 44 U/L - 10 11       Microbiology: Recent Results (from the past 240 hour(s))  Resp Panel by RT-PCR (Flu A&B, Covid) Nasopharyngeal Swab     Status: None   Collection Time: 04/13/20 12:16 PM   Specimen: Nasopharyngeal Swab; Nasopharyngeal(NP) swabs in vial transport medium  Result Value Ref Range Status   SARS Coronavirus 2 by RT PCR NEGATIVE NEGATIVE Final    Comment: (NOTE) SARS-CoV-2 target nucleic acids are NOT DETECTED.  The SARS-CoV-2 RNA is generally detectable in upper respiratory specimens during the acute phase of infection. The lowest concentration of SARS-CoV-2 viral copies this assay can detect is 138 copies/mL. A negative result does not preclude SARS-Cov-2 infection and should not be used as the sole basis for treatment or other patient management decisions. A negative result may occur with  improper specimen collection/handling, submission of specimen other than nasopharyngeal swab, presence of viral mutation(s) within the areas targeted by this assay, and inadequate number of viral copies(<138 copies/mL). A negative result must be combined with clinical observations, patient history, and epidemiological information. The expected result is Negative.  Fact Sheet for Patients:  BloggerCourse.com  Fact Sheet for Healthcare Providers:  SeriousBroker.it  This test is no t yet approved or cleared by the [place] FDA and  has been authorized for detection and/or diagnosis of SARS-CoV-2 by FDA under an Emergency Use Authorization (EUA). This EUA will remain  in effect (meaning this test can be used) for the duration of the COVID-19 declaration under Section 564(b)(1) of the Act, 21 U.S.C.section 360bbb-3(b)(1), unless the authorization is terminated  or revoked sooner.       Influenza A by PCR NEGATIVE NEGATIVE Final   Influenza B by PCR NEGATIVE NEGATIVE Final    Comment: (NOTE) The Xpert Xpress SARS-CoV-2/FLU/RSV plus  assay is intended as an aid in the diagnosis of influenza from Nasopharyngeal swab specimens and should not be used as a sole basis for treatment. Nasal washings and aspirates are unacceptable for Xpert Xpress SARS-CoV-2/FLU/RSV testing.  Fact Sheet for Patients: BloggerCourse.com  Fact Sheet for Healthcare Providers: SeriousBroker.it  This test is not yet approved or cleared by the [place] FDA and has been authorized for detection and/or diagnosis of SARS-CoV-2 by FDA under an Emergency Use Authorization (EUA). This EUA will remain in effect (meaning this test can be used) for the duration of the COVID-19 declaration under Section 564(b)(1) of the Act, 21 U.S.C. section 360bbb-3(b)(1), unless the authorization is terminated or revoked.  Performed at [facility], [address], [place], [place] [address]   Urine Culture     Status: Abnormal   Collection Time: 04/13/20 12:16 PM   Specimen: Urine, Random  Result Value Ref Range Status   Specimen Description   Final    URINE, RANDOM Performed at [facility], [address], [place], [place] [address]    Special Requests   Final    NONE Performed at [facility], [address], [place], [place] [address]    Culture (A)  Final    >=  100,000 COLONIES/mL ESCHERICHIA COLI Confirmed Extended Spectrum Beta-Lactamase Producer (ESBL).  In bloodstream infections from ESBL organisms, carbapenems are preferred over piperacillin/tazobactam. They are shown to have a lower risk of mortality.    Report Status 04/16/2020 FINAL  Final   Organism ID, Bacteria ESCHERICHIA COLI (A)  Final      Susceptibility   Escherichia coli - MIC*    AMPICILLIN >=32 RESISTANT Resistant     CEFAZOLIN >=64 RESISTANT Resistant     CEFEPIME 8 INTERMEDIATE Intermediate     CEFTRIAXONE >=64 RESISTANT Resistant     CIPROFLOXACIN >=4 RESISTANT Resistant     GENTAMICIN >=16  RESISTANT Resistant     IMIPENEM <=0.25 SENSITIVE Sensitive     NITROFURANTOIN <=16 SENSITIVE Sensitive     TRIMETH/SULFA >=320 RESISTANT Resistant     AMPICILLIN/SULBACTAM >=32 RESISTANT Resistant     PIP/TAZO 8 SENSITIVE Sensitive     * >=100,000 COLONIES/mL ESCHERICHIA COLI  Culture, blood (Routine X 2) w Reflex to ID Panel     Status: Abnormal   Collection Time: 04/13/20  6:50 PM   Specimen: BLOOD  Result Value Ref Range Status   Specimen Description   Final    BLOOD BLOOD LEFT HAND Performed at [facility], [address], [place], [place] [address]    Special Requests   Final    BOTTLES DRAWN AEROBIC AND ANAEROBIC Blood Culture adequate volume Performed at [facility], [address] [place], [place] [address]    Culture  Setup Time   Final    GRAM NEGATIVE RODS IN BOTH AEROBIC AND ANAEROBIC BOTTLES CRITICAL RESULT CALLED TO, READ BACK BY AND VERIFIED WITHPaschal Dopp AT 4332 04/14/20 SDR Performed at [facility], [address] [place], [place] [address]    Culture (A)  Final    ESCHERICHIA COLI Confirmed Extended Spectrum Beta-Lactamase Producer (ESBL).  In bloodstream infections from ESBL organisms, carbapenems are preferred over piperacillin/tazobactam. They are shown to have a lower risk of mortality.    Report Status 04/16/2020 FINAL  Final   Organism ID, Bacteria ESCHERICHIA COLI  Final      Susceptibility   Escherichia coli - MIC*    AMPICILLIN >=32 RESISTANT Resistant     CEFAZOLIN >=64 RESISTANT Resistant     CEFEPIME 16 RESISTANT Resistant     CEFTAZIDIME RESISTANT Resistant     CEFTRIAXONE >=64 RESISTANT Resistant     CIPROFLOXACIN >=4 RESISTANT Resistant     GENTAMICIN >=16 RESISTANT Resistant     IMIPENEM <=0.25 SENSITIVE Sensitive     TRIMETH/SULFA >=320 RESISTANT Resistant     AMPICILLIN/SULBACTAM >=32 RESISTANT Resistant     PIP/TAZO 8 SENSITIVE Sensitive     * ESCHERICHIA COLI  Culture, blood (Routine X 2) w Reflex  to ID Panel     Status: Abnormal   Collection Time: 04/13/20  6:50 PM   Specimen: BLOOD  Result Value Ref Range Status   Specimen Description   Final    BLOOD BLOOD RIGHT HAND Performed at [facility], [address] [place], [place] [address]    Special Requests   Final    BOTTLES DRAWN AEROBIC AND ANAEROBIC Blood Culture adequate volume Performed at [facility], [address] [place], [place] [address]    Culture  Setup Time   Final    GRAM NEGATIVE RODS IN BOTH AEROBIC AND ANAEROBIC BOTTLES CRITICAL VALUE NOTED.  VALUE IS CONSISTENT WITH PREVIOUSLY REPORTED AND CALLED VALUE. Performed at [facility], [address] [place], [place] [address]    Culture (A)  Final    ESCHERICHIA COLI SUSCEPTIBILITIES PERFORMED ON PREVIOUS CULTURE WITHIN THE LAST 5 DAYS. Performed at [facility], [address], [place], [place] [address]    Report Status 04/16/2020 FINAL  Final  Blood Culture ID Panel (Reflexed)     Status: Abnormal   Collection Time: 04/13/20  6:50 PM  Result Value Ref Range Status   Enterococcus faecalis NOT DETECTED NOT DETECTED Final   Enterococcus Faecium NOT DETECTED NOT DETECTED Final   Listeria monocytogenes NOT DETECTED NOT DETECTED Final   Staphylococcus species NOT DETECTED NOT DETECTED Final   Staphylococcus aureus (BCID) NOT DETECTED NOT DETECTED Final   Staphylococcus epidermidis NOT DETECTED NOT DETECTED Final   Staphylococcus lugdunensis NOT DETECTED NOT DETECTED Final   Streptococcus species NOT DETECTED NOT DETECTED Final   Streptococcus agalactiae NOT DETECTED NOT DETECTED Final   Streptococcus pneumoniae NOT DETECTED NOT DETECTED Final   Streptococcus pyogenes NOT DETECTED NOT DETECTED Final   A.calcoaceticus-baumannii NOT DETECTED NOT DETECTED Final   Bacteroides fragilis NOT DETECTED NOT DETECTED Final   Enterobacterales DETECTED (A) NOT DETECTED Final    Comment: Enterobacterales represent a large order of  gram negative bacteria, not a single organism. CRITICAL RESULT CALLED TO, READ BACK BY AND VERIFIED WITH:  Kevin Fenton PATEL AT 1638 04/14/20 SDR    Enterobacter cloacae complex NOT DETECTED NOT DETECTED Final   Escherichia coli DETECTED (A) NOT DETECTED Final    Comment: CRITICAL RESULT CALLED TO, READ BACK BY AND VERIFIED WITH:  Paschal Dopp AT 4665 04/14/20 SDR    Klebsiella aerogenes NOT DETECTED NOT DETECTED Final   Klebsiella oxytoca NOT DETECTED NOT DETECTED Final   Klebsiella pneumoniae NOT DETECTED NOT DETECTED Final   Proteus species NOT DETECTED NOT DETECTED Final   Salmonella species NOT DETECTED NOT DETECTED Final   Serratia marcescens NOT DETECTED NOT DETECTED Final   Haemophilus influenzae NOT DETECTED NOT DETECTED Final   Neisseria meningitidis NOT DETECTED NOT DETECTED Final   Pseudomonas aeruginosa NOT DETECTED NOT DETECTED Final   Stenotrophomonas maltophilia NOT DETECTED NOT DETECTED Final   Candida albicans NOT DETECTED NOT DETECTED Final   Candida auris NOT DETECTED NOT DETECTED Final   Candida glabrata NOT DETECTED NOT DETECTED Final   Candida krusei NOT DETECTED NOT DETECTED Final   Candida parapsilosis NOT DETECTED NOT DETECTED Final   Candida tropicalis NOT DETECTED NOT DETECTED Final   Cryptococcus neoformans/gattii NOT DETECTED NOT DETECTED Final   CTX-M ESBL DETECTED (A) NOT DETECTED Final    Comment: CRITICAL RESULT CALLED TO, READ BACK BY AND VERIFIED WITHKevin Fenton PATEL AT 9935 04/14/20 SDR (NOTE) Extended spectrum beta-lactamase detected. Recommend a carbapenem as initial therapy.      Carbapenem resistance IMP NOT DETECTED NOT DETECTED Final   Carbapenem resistance KPC NOT DETECTED NOT DETECTED Final   Carbapenem resistance NDM NOT DETECTED NOT DETECTED Final   Carbapenem resist OXA 48 LIKE NOT DETECTED NOT DETECTED Final   Carbapenem resistance VIM NOT DETECTED NOT DETECTED Final    Comment: Performed at [facility], [address]  [place], [place] [address]  CULTURE, BLOOD (ROUTINE X 2) w Reflex to ID Panel     Status: None (Preliminary result)   Collection Time: 04/16/20 12:24 AM   Specimen: BLOOD  Result Value Ref Range Status   Specimen Description BLOOD RIGHT [facility]  Final   Special Requests   Final  BOTTLES DRAWN AEROBIC AND ANAEROBIC Blood Culture adequate volume   Culture   Final    NO GROWTH < 12 HOURS Performed at [facility], [address] [place], [place] [address]    Report Status PENDING  Incomplete  CULTURE, BLOOD (ROUTINE X 2) w Reflex to ID Panel     Status: None (Preliminary result)   Collection Time: 04/16/20 12:25 AM   Specimen: BLOOD  Result Value Ref Range Status   Specimen Description BLOOD LEFT AC  Final   Special Requests   Final    BOTTLES DRAWN AEROBIC AND ANAEROBIC Blood Culture adequate volume   Culture   Final    NO GROWTH < 12 HOURS Performed at [facility], [address] [place], [place] [address]    Report Status PENDING  Incomplete    IMAGING RESULTS:  I have personally reviewed the films Obstructing 9 millimeters calculus in the RIGHT ureteropelvic junction associated with moderate RIGHT hydronephrosis, and perinephric inflammatory changes consistent with pyelonephritis. Significant increase in retroperitoneal fluid, favoring infectious process ? Impression/Recommendation ESBL bacteremia ESBL e.coli Complicated UTI with rt ureteric stone with rt hydronephrosis S/p Stent Will need a minimum of 2 weeks of ertapenem on discharge and recommend stone management while on IV antibiotic as Op Pt unable to organize herself to do IV antibiotic by herself- so she will come to the day surgery  Anemia  HTn- on amlodipine, lisinopril   ? ? ___________________________________________________ Discussed with patient, requesting provider and care team Note:  This document was prepared using Dragon voice recognition software and may include unintentional  dictation errors.

## 2020-04-16 NOTE — Progress Notes (Signed)
[facility]  - [place] at [facility]   PATIENT NAME: Alison Price    MR#:  [id number]  DATE OF BIRTH:  Dec 21, 1945  SUBJECTIVE:  patient came in with right-sided flank pain. She was found to be septic and had a right ureteral stent placement. Overall doing well. Looks better. Ambulating around in the room well. Afebrile REVIEW OF SYSTEMS:   Review of Systems  Constitutional: Negative for chills, fever and weight loss.  HENT: Negative for ear discharge, ear pain and nosebleeds.   Eyes: Negative for blurred vision, pain and discharge.  Respiratory: Negative for sputum production, shortness of breath, wheezing and stridor.   Cardiovascular: Negative for chest pain, palpitations, orthopnea and PND.  Gastrointestinal: Negative for abdominal pain, diarrhea, nausea and vomiting.  Genitourinary: Negative for frequency and urgency.  Musculoskeletal: Negative for back pain and joint pain.  Neurological: Positive for weakness. Negative for sensory change, speech change and focal weakness.  Psychiatric/Behavioral: Negative for depression and hallucinations. The patient is not nervous/anxious.    Tolerating Diet:yes Tolerating PT: self ambulatory  DRUG ALLERGIES:   Allergies  Allergen Reactions  . Codeine Nausea And Vomiting  . Sulfa Antibiotics Other (See Comments)    STEVEN JOHNSON SYNDROME    VITALS:  Blood pressure (!) 114/27, pulse 79, temperature 98.8 F (37.1 C), temperature source Oral, resp. rate 15, height 5\' 8"  (1.727 m), weight 63 kg, SpO2 100 %.  PHYSICAL EXAMINATION:   Physical Exam  GENERAL:  75 y.o.-year-old patient lying in the bed with no acute distress.  LUNGS: Normal breath sounds bilaterally, no wheezing, rales, rhonchi. No use of accessory muscles of respiration.  CARDIOVASCULAR: S1, S2 normal. No murmurs, rubs, or gallops.  ABDOMEN: Soft, nontender, nondistended. Bowel sounds present. No organomegaly or mass.  EXTREMITIES: No cyanosis,  clubbing or edema b/l.    NEUROLOGIC: Cranial nerves II through XII are intact. No focal Motor or sensory deficits b/l.   PSYCHIATRIC:  patient is alert and oriented x 3.  SKIN: No obvious rash, lesion, or ulcer.   LABORATORY PANEL:  CBC Recent Labs  Lab 04/16/20 0024  WBC 6.1  HGB 8.6*  HCT 25.4*  PLT 150    Chemistries  Recent Labs  Lab 04/13/20 1030 04/14/20 0532  NA  --  134*  K  --  4.0  CL  --  98  CO2  --  26  GLUCOSE  --  128*  BUN  --  26*  CREATININE  --  1.17*  CALCIUM  --  9.2  AST 21  --   ALT 10  --   ALKPHOS 91  --   BILITOT 0.8  --    Cardiac Enzymes No results for input(s): TROPONINI in the last 168 hours. RADIOLOGY:  No results found. ASSESSMENT AND PLAN:  KADEE PHILYAW is a 75 y.o. female with medical history significant of kidney stone, hypertension, GERD, depression, Stevens-Johnson syndrome, anemia, chronic pain syndrome, arthritis, UTI due to ESBL, who presents with right flank pain, abdominal pain. Patient states that she has been having right flank pain for more than a week.  She states that since last night she started having nausea, vomiting, abdominal pain.    Sepsis due to ESBL e coli  Acute pyelonephritis and obstructive uropathy:  leukocytosis -- patient presented with tachycardia heart rate more than 90 white count of 17.1 and positive blood culture, abnormal CT abdomen consistent with  --Dr. 66 of urology is consulted --> right ureteral stent placement.  --  Meropenem IV --Follow-up of blood culture 3/4 bottles ESBL E. Coli -- removed Foley  --2/25--tmax 102 --2/26--no fever. --2/27--BC and CBC for am. Doing well. ID consult placed for help with abxs --2/28-- repeat blood culture so far negative. White count 6.1. ID Dr. Montez Hageman recommends IV ertapenem-- total two weeks. Will get PICC line placed tomorrow if repeat blood cultures continue to remain negative -- TOC for discharge planning to home. Patient will need home health RN  for IV antibiotic  Essential hypertension -IV hydralazine as needed -Amlodipine, lisinopril  Gastroesophageal reflux disease without esophagitis -Protonix  Normocytic anemia: Hemoglobin 10.3 (11.9 only 1/60/22), slightly dropped.  No active bleeding -Continue iron supplement  Depression -Continue home medications  Chronic pain -Continue home oxycodone and MS Contin, with reduced dose    DVT ppx: SCD Code Status: Full code Family Communication:  none  disposition Plan:  Anticipate discharge back to previous environment Consults called: Dr. Richardo Hanks of urology Admission status and Level of care: Med-Surg:    Med-surg bed as inpt      Status is: Inpatient  Remains inpatient appropriate because:Inpatient level of care appropriate due to severity of illness   Dispo: The patient is from: Home  Anticipated d/c is to: Home  Patient currently is  medically stable to d/c.              Difficult to place patient No continue IV antibiotics. Patient has ESBL E. coli and blood culture. Repeat blood cultures negative. Will wait for another 12 hours to ensure they remain negative before placing PICC line for IV antibiotics.  Likely discharge home tomorrow once PICC line is placed and home antibiotic education is completed.        TOTAL TIME TAKING CARE OF THIS PATIENT: *25* minutes.  >50% time spent on counselling and coordination of care  Note: This dictation was prepared with Dragon dictation along with smaller phrase technology. Any transcriptional errors that result from this process are unintentional.  Enedina Finner M.D    [facility]   CC: Primary care physician; Jaclyn Shaggy, MDPatient ID: Alison Price, female   DOB: 1945/07/30, 76 y.o.   MRN: [id number]

## 2020-04-17 ENCOUNTER — Inpatient Hospital Stay: Payer: Self-pay

## 2020-04-17 MED ORDER — CHLORHEXIDINE GLUCONATE CLOTH 2 % EX PADS: 6.0000 | MEDICATED_PAD | Freq: Every day | CUTANEOUS | Status: DC

## 2020-04-17 MED ORDER — SODIUM CHLORIDE 0.9% FLUSH: 10.0000 mL | INTRAVENOUS | Status: DC | PRN

## 2020-04-17 MED ORDER — SODIUM CHLORIDE 0.9% FLUSH: 10.0000 mL | Freq: Two times a day (BID) | INTRAVENOUS | Status: DC

## 2020-04-17 NOTE — Discharge Summary (Addendum)
[facility] [place] [facility]   PATIENT NAME: Alison Price    MR#:  [id number]  DATE OF BIRTH:  03-14-1945  DATE OF ADMISSION:  04/13/2020 ADMITTING PHYSICIAN: Lorretta Harp, MD  DATE OF DISCHARGE: 04/17/2020  PRIMARY CARE PHYSICIAN: Jaclyn Shaggy, MD    ADMISSION DIAGNOSIS:  Kidney stone [N20.0] Pyelonephritis [N12] Right flank pain [R10.9] Acute pyelonephritis [N10]  DISCHARGE DIAGNOSIS:  ESBL Ecoli Sepsis POA--resolved Right side pyelonephritis Right ureteropelvic stone status post stent placement  SECONDARY DIAGNOSIS:   Past Medical History:  Diagnosis Date  . Anemia   . Arthritis   . Chronic pain   . GERD (gastroesophageal reflux disease)   . Hypertension   . Osteoporosis   . Stevens-Johnson syndrome [facility])     HOSPITAL COURSE:  [place] 75 y.o.femalewith medical history significant ofkidney stone, hypertension, GERD, depression, Stevens-Johnson syndrome, anemia, chronic pain syndrome, arthritis, UTIdue toESBL,who presents with right flank pain, abdominal pain. Patient states that she has been having right flank pain for more than a week.She states that since last night she started having nausea, vomiting, abdominal pain.   Sepsis due to ESBL e coli  Acute pyelonephritisand obstructive uropathy: leukocytosis -- patient presented with tachycardia heart rate more than 90 white count of 17.1 and positive blood culture, abnormal CT abdomen consistent with         --Dr. Richardo Hanks of urology is consulted -->s/pright ureteral stent placement.  --Meropenem IV --Follow-up of blood culture 3/4 bottles ESBL E. Coli -- removed Foley  --2/25--tmax 102 --2/26--no fever. --2/27--BC and CBC for am. Doing well. ID consult placed for help with abxs --2/28-- repeat blood culture so far negative. White count 6.1. ID Dr. Montez Hageman recommends IV ertapenem-- total two weeks. Will get PICC line placed tomorrow if repeat blood cultures continue  to remain negative -- TOC for discharge planning to home. Patient will need home health RN for IV antibiotic --3/1-- patient doing well. Will get PICC line placed. Dr. Joylene Draft to place orders for same day surgery visit for IV antibiotic remainder course. -- In discussion with urology Dr. Richardo Hanks patient will be followed by him and tentatively has scheduled her for ureteroscopy on  March 11th.  Essential hypertension -IV hydralazine as needed -Amlodipine, lisinopril  Gastroesophageal reflux disease without esophagitis -Protonix  Normocytic anemia: Hemoglobin 10.3 (11.9 only 1/60/22), slightly dropped. No active bleeding -Continue iron supplement  Depression -Continue home medications  Chronic pain -on  oxycodone and MS Contin    DVT ppx: SCD Code Status:Full code Family Communication: none  disposition Plan: Anticipate discharge back to previous environment Consults called:Dr. Alice Rieger urology, infectious disease Dr. Joylene Draft Admission status and Level of care:Med-Surg: Med-surg bed as inpt    Dispo: The patient is from:Home Anticipated d/c is NW:[facility] Patient currently is  medically stable to d/c. Difficult to place patient No  Patient will discharged later to home. She will get her IV antibiotic that same day surgery. Patient will need to follow-up with urology for definitive stone treatment. Patient has been informed of above and voiced understanding    CONSULTS OBTAINED:  Treatment Team:  Sondra Come, MD Lynn Ito, MD  DRUG ALLERGIES:   Allergies  Allergen Reactions  . Codeine Nausea And Vomiting  . Sulfa Antibiotics Other (See Comments)    STEVEN JOHNSON SYNDROME    DISCHARGE MEDICATIONS:   Allergies as of 04/17/2020      Reactions   Codeine Nausea And Vomiting   Sulfa Antibiotics Other (  See Comments)   Trudie Buckler SYNDROME      Medication List    STOP  taking these medications   Lactulose 20 GM/30ML Soln   senna-docusate 8.6-50 MG tablet Commonly known as: Senokot-S     TAKE these medications   alum & mag hydroxide-simeth 400-400-40 MG/5ML suspension Commonly known as: MAALOX PLUS Take 30 mLs by mouth every 4 (four) hours as needed for indigestion.   amLODipine 5 MG tablet Commonly known as: NORVASC Take 5 mg by mouth daily.   butalbital-acetaminophen-caffeine 50-325-40 MG tablet Commonly known as: FIORICET Take 1-2 tablets by mouth every 6 (six) hours as needed for headache or migraine.   dimenhyDRINATE 50 MG tablet Commonly known as: DRAMAMINE Take 50 mg by mouth every 8 (eight) hours as needed for nausea.   escitalopram 5 MG tablet Commonly known as: LEXAPRO Take 5 mg by mouth daily.   feeding supplement Liqd Take 237 mLs by mouth 3 (three) times daily between meals.   Fenofibric Acid 135 MG Cpdr Take 135 mg by mouth daily.   ferrous sulfate 325 (65 FE) MG tablet Take 325 mg by mouth 3 (three) times daily with meals.   fexofenadine 180 MG tablet Commonly known as: ALLEGRA Take 180 mg by mouth daily.   fluticasone 50 MCG/ACT nasal spray Commonly known as: FLONASE Place 2 sprays into both nostrils daily as needed for allergies or rhinitis.   lisinopril 20 MG tablet Commonly known as: ZESTRIL Take 20 mg by mouth daily.   meclizine 25 MG tablet Commonly known as: ANTIVERT Take 25 mg by mouth 2 (two) times daily as needed for dizziness.   morphine 60 MG 12 hr tablet Commonly known as: MS CONTIN Take 1 tablet (60 mg total) by mouth 3 (three) times daily.   multivitamin tablet Take 1 tablet by mouth daily.   nystatin 100000 UNIT/ML suspension Commonly known as: MYCOSTATIN Take 5 mLs (500,000 Units total) by mouth 4 (four) times daily.   ondansetron 4 MG disintegrating tablet Commonly known as: Zofran ODT Take 1 tablet (4 mg total) by mouth every 8 (eight) hours as needed for nausea or vomiting.    Oxycodone HCl 10 MG Tabs Take 10 mg by mouth 3 (three) times daily.   pantoprazole 40 MG tablet Commonly known as: Protonix Take 1 tablet (40 mg total) by mouth daily.   polyethylene glycol 17 g packet Commonly known as: MIRALAX / GLYCOLAX Take 17 g by mouth daily.   potassium chloride 10 MEQ tablet Commonly known as: KLOR-CON Take 1 tablet (10 mEq total) by mouth daily.   vitamin C 500 MG tablet Commonly known as: ASCORBIC ACID Take 500 mg by mouth 3 (three) times daily. anemia- give with ferrous sulfate to potentiate iron absorption   Vitamin D 50 MCG (2000 UT) Caps Take 6,000 Units by mouth daily.       If you experience worsening of your admission symptoms, develop shortness of breath, life threatening emergency, suicidal or homicidal thoughts you must seek medical attention immediately by calling 911 or calling your MD immediately  if symptoms less severe.  You Must read complete instructions/literature along with all the possible adverse reactions/side effects for all the Medicines you take and that have been prescribed to you. Take any new Medicines after you have completely understood and accept all the possible adverse reactions/side effects.   Please note  You were cared for by a hospitalist during your hospital stay. If you have any questions about your discharge medications  or the care you received while you were in the hospital after you are discharged, you can call the unit and asked to speak with the hospitalist on call if the hospitalist that took care of you is not available. Once you are discharged, your primary care physician will handle any further medical issues. Please note that NO REFILLS for any discharge medications will be authorized once you are discharged, as it is imperative that you return to your primary care physician (or establish a relationship with a primary care physician if you do not have one) for your aftercare needs so that they can reassess  your need for medications and monitor your lab values. Today   SUBJECTIVE   Doing well  VITAL SIGNS:  Blood pressure 135/73, pulse 72, temperature 98 F (36.7 C), temperature source Oral, resp. rate 20, height 5\' 8"  (1.727 m), weight 63 kg, SpO2 100 %.  I/O:    Intake/Output Summary (Last 24 hours) at 04/17/2020 0902 Last data filed at 04/16/2020 2244 Gross per 24 hour  Intake 0 ml  Output -  Net 0 ml    PHYSICAL EXAMINATION:  GENERAL:  75 y.o.-year-old patient lying in the bed with no acute distress.  LUNGS: Normal breath sounds bilaterally, no wheezing, rales,rhonchi or crepitation. No use of accessory muscles of respiration.  CARDIOVASCULAR: S1, S2 normal. No murmurs, rubs, or gallops.  ABDOMEN: Soft, non-tender, non-distended. Bowel sounds present. No organomegaly or mass.  EXTREMITIES: No pedal edema, cyanosis, or clubbing. DJD+ NEUROLOGIC: Cranial nerves II through XII are intact. Muscle strength 5/5 in all extremities. Sensation intact. Gait not checked.  PSYCHIATRIC: The patient is alert and oriented x 3.  SKIN: No obvious rash, lesion, or ulcer.   DATA REVIEW:   CBC  Recent Labs  Lab 04/16/20 0024  WBC 6.1  HGB 8.6*  HCT 25.4*  PLT 150    Chemistries  Recent Labs  Lab 04/13/20 1030 04/14/20 0532  NA  --  134*  K  --  4.0  CL  --  98  CO2  --  26  GLUCOSE  --  128*  BUN  --  26*  CREATININE  --  1.17*  CALCIUM  --  9.2  AST 21  --   ALT 10  --   ALKPHOS 91  --   BILITOT 0.8  --     Microbiology Results   Recent Results (from the past 240 hour(s))  Resp Panel by RT-PCR (Flu A&B, Covid) Nasopharyngeal Swab     Status: None   Collection Time: 04/13/20 12:16 PM   Specimen: Nasopharyngeal Swab; Nasopharyngeal(NP) swabs in vial transport medium  Result Value Ref Range Status   SARS Coronavirus 2 by RT PCR NEGATIVE NEGATIVE Final    Comment: (NOTE) SARS-CoV-2 target nucleic acids are NOT DETECTED.  The SARS-CoV-2 RNA is generally detectable in  upper respiratory specimens during the acute phase of infection. The lowest concentration of SARS-CoV-2 viral copies this assay can detect is 138 copies/mL. A negative result does not preclude SARS-Cov-2 infection and should not be used as the sole basis for treatment or other patient management decisions. A negative result may occur with  improper specimen collection/handling, submission of specimen other than nasopharyngeal swab, presence of viral mutation(s) within the areas targeted by this assay, and inadequate number of viral copies(<138 copies/mL). A negative result must be combined with clinical observations, patient history, and epidemiological information. The expected result is Negative.  Fact Sheet for Patients:  04/15/20  Fact Sheet for Healthcare Providers:  SeriousBroker.it  This test is no t yet approved or cleared by the [place] FDA and  has been authorized for detection and/or diagnosis of SARS-CoV-2 by FDA under an Emergency Use Authorization (EUA). This EUA will remain  in effect (meaning this test can be used) for the duration of the COVID-19 declaration under Section 564(b)(1) of the Act, 21 U.S.C.section 360bbb-3(b)(1), unless the authorization is terminated  or revoked sooner.       Influenza A by PCR NEGATIVE NEGATIVE Final   Influenza B by PCR NEGATIVE NEGATIVE Final    Comment: (NOTE) The Xpert Xpress SARS-CoV-2/FLU/RSV plus assay is intended as an aid in the diagnosis of influenza from Nasopharyngeal swab specimens and should not be used as a sole basis for treatment. Nasal washings and aspirates are unacceptable for Xpert Xpress SARS-CoV-2/FLU/RSV testing.  Fact Sheet for Patients: BloggerCourse.com  Fact Sheet for Healthcare Providers: SeriousBroker.it  This test is not yet approved or cleared by the [place] FDA and has been  authorized for detection and/or diagnosis of SARS-CoV-2 by FDA under an Emergency Use Authorization (EUA). This EUA will remain in effect (meaning this test can be used) for the duration of the COVID-19 declaration under Section 564(b)(1) of the Act, 21 U.S.C. section 360bbb-3(b)(1), unless the authorization is terminated or revoked.  Performed at [facility], [address], [place], [place] [address]   Urine Culture     Status: Abnormal   Collection Time: 04/13/20 12:16 PM   Specimen: Urine, Random  Result Value Ref Range Status   Specimen Description   Final    URINE, RANDOM Performed at [facility], [address], [place], [place] [address]    Special Requests   Final    NONE Performed at [facility], [address] [place], [place] [address]    Culture (A)  Final    >=100,000 COLONIES/mL ESCHERICHIA COLI Confirmed Extended Spectrum Beta-Lactamase Producer (ESBL).  In bloodstream infections from ESBL organisms, carbapenems are preferred over piperacillin/tazobactam. They are shown to have a lower risk of mortality.    Report Status 04/16/2020 FINAL  Final   Organism ID, Bacteria ESCHERICHIA COLI (A)  Final      Susceptibility   Escherichia coli - MIC*    AMPICILLIN >=32 RESISTANT Resistant     CEFAZOLIN >=64 RESISTANT Resistant     CEFEPIME 8 INTERMEDIATE Intermediate     CEFTRIAXONE >=64 RESISTANT Resistant     CIPROFLOXACIN >=4 RESISTANT Resistant     GENTAMICIN >=16 RESISTANT Resistant     IMIPENEM <=0.25 SENSITIVE Sensitive     NITROFURANTOIN <=16 SENSITIVE Sensitive     TRIMETH/SULFA >=320 RESISTANT Resistant     AMPICILLIN/SULBACTAM >=32 RESISTANT Resistant     PIP/TAZO 8 SENSITIVE Sensitive     * >=100,000 COLONIES/mL ESCHERICHIA COLI  Culture, blood (Routine X 2) w Reflex to ID Panel     Status: Abnormal   Collection Time: 04/13/20  6:50 PM   Specimen: BLOOD  Result Value Ref Range Status   Specimen Description   Final     BLOOD BLOOD LEFT HAND Performed at [facility], [address] [place], [place] [address]    Special Requests   Final    BOTTLES DRAWN AEROBIC AND ANAEROBIC Blood Culture adequate volume Performed at [facility], [address], [place], [place] [address]    Culture  Setup Time   Final    GRAM NEGATIVE RODS IN BOTH  AEROBIC AND ANAEROBIC BOTTLES CRITICAL RESULT CALLED TO, READ BACK BY AND VERIFIED WITHPaschal Dopp AT 5809 04/14/20 SDR Performed at [facility], [address], [place], [place] [address]    Culture (A)  Final    ESCHERICHIA COLI Confirmed Extended Spectrum Beta-Lactamase Producer (ESBL).  In bloodstream infections from ESBL organisms, carbapenems are preferred over piperacillin/tazobactam. They are shown to have a lower risk of mortality.    Report Status 04/16/2020 FINAL  Final   Organism ID, Bacteria ESCHERICHIA COLI  Final      Susceptibility   Escherichia coli - MIC*    AMPICILLIN >=32 RESISTANT Resistant     CEFAZOLIN >=64 RESISTANT Resistant     CEFEPIME 16 RESISTANT Resistant     CEFTAZIDIME RESISTANT Resistant     CEFTRIAXONE >=64 RESISTANT Resistant     CIPROFLOXACIN >=4 RESISTANT Resistant     GENTAMICIN >=16 RESISTANT Resistant     IMIPENEM <=0.25 SENSITIVE Sensitive     TRIMETH/SULFA >=320 RESISTANT Resistant     AMPICILLIN/SULBACTAM >=32 RESISTANT Resistant     PIP/TAZO 8 SENSITIVE Sensitive     * ESCHERICHIA COLI  Culture, blood (Routine X 2) w Reflex to ID Panel     Status: Abnormal   Collection Time: 04/13/20  6:50 PM   Specimen: BLOOD  Result Value Ref Range Status   Specimen Description   Final    BLOOD BLOOD RIGHT HAND Performed at [facility], [address] [place], [place] [address]    Special Requests   Final    BOTTLES DRAWN AEROBIC AND ANAEROBIC Blood Culture adequate volume Performed at [facility], [address] [place], [place] [address]    Culture  Setup Time   Final     GRAM NEGATIVE RODS IN BOTH AEROBIC AND ANAEROBIC BOTTLES CRITICAL VALUE NOTED.  VALUE IS CONSISTENT WITH PREVIOUSLY REPORTED AND CALLED VALUE. Performed at [facility], [address] [place], [place] [address]    Culture (A)  Final    ESCHERICHIA COLI SUSCEPTIBILITIES PERFORMED ON PREVIOUS CULTURE WITHIN THE LAST 5 DAYS. Performed at [facility], [address] [place], [place] [address]    Report Status 04/16/2020 FINAL  Final  Blood Culture ID Panel (Reflexed)     Status: Abnormal   Collection Time: 04/13/20  6:50 PM  Result Value Ref Range Status   Enterococcus faecalis NOT DETECTED NOT DETECTED Final   Enterococcus Faecium NOT DETECTED NOT DETECTED Final   Listeria monocytogenes NOT DETECTED NOT DETECTED Final   Staphylococcus species NOT DETECTED NOT DETECTED Final   Staphylococcus aureus (BCID) NOT DETECTED NOT DETECTED Final   Staphylococcus epidermidis NOT DETECTED NOT DETECTED Final   Staphylococcus lugdunensis NOT DETECTED NOT DETECTED Final   Streptococcus species NOT DETECTED NOT DETECTED Final   Streptococcus agalactiae NOT DETECTED NOT DETECTED Final   Streptococcus pneumoniae NOT DETECTED NOT DETECTED Final   Streptococcus pyogenes NOT DETECTED NOT DETECTED Final   A.calcoaceticus-baumannii NOT DETECTED NOT DETECTED Final   Bacteroides fragilis NOT DETECTED NOT DETECTED Final   Enterobacterales DETECTED (A) NOT DETECTED Final    Comment: Enterobacterales represent a large order of gram negative bacteria, not a single organism. CRITICAL RESULT CALLED TO, READ BACK BY AND VERIFIED WITH:  Kevin Fenton PATEL AT 9735 04/14/20 SDR    Enterobacter cloacae complex NOT DETECTED NOT DETECTED Final   Escherichia coli DETECTED (A) NOT DETECTED Final    Comment: CRITICAL RESULT CALLED TO, READ BACK BY AND VERIFIED WITH:  Kevin Fenton PATEL AT 1308 04/14/20 SDR    Klebsiella aerogenes NOT DETECTED NOT DETECTED Final   Klebsiella oxytoca NOT DETECTED NOT DETECTED Final    Klebsiella pneumoniae NOT DETECTED NOT DETECTED Final   Proteus species NOT DETECTED NOT DETECTED Final   Salmonella species NOT DETECTED NOT DETECTED Final   Serratia marcescens NOT DETECTED NOT DETECTED Final   Haemophilus influenzae NOT DETECTED NOT DETECTED Final   Neisseria meningitidis NOT DETECTED NOT DETECTED Final   Pseudomonas aeruginosa NOT DETECTED NOT DETECTED Final   Stenotrophomonas maltophilia NOT DETECTED NOT DETECTED Final   Candida albicans NOT DETECTED NOT DETECTED Final   Candida auris NOT DETECTED NOT DETECTED Final   Candida glabrata NOT DETECTED NOT DETECTED Final   Candida krusei NOT DETECTED NOT DETECTED Final   Candida parapsilosis NOT DETECTED NOT DETECTED Final   Candida tropicalis NOT DETECTED NOT DETECTED Final   Cryptococcus neoformans/gattii NOT DETECTED NOT DETECTED Final   CTX-M ESBL DETECTED (A) NOT DETECTED Final    Comment: CRITICAL RESULT CALLED TO, READ BACK BY AND VERIFIED WITHKevin Fenton PATEL AT 6578 04/14/20 SDR (NOTE) Extended spectrum beta-lactamase detected. Recommend a carbapenem as initial therapy.      Carbapenem resistance IMP NOT DETECTED NOT DETECTED Final   Carbapenem resistance KPC NOT DETECTED NOT DETECTED Final   Carbapenem resistance NDM NOT DETECTED NOT DETECTED Final   Carbapenem resist OXA 48 LIKE NOT DETECTED NOT DETECTED Final   Carbapenem resistance VIM NOT DETECTED NOT DETECTED Final    Comment: Performed at [facility], [address] [place], [place] [address]  CULTURE, BLOOD (ROUTINE X 2) w Reflex to ID Panel     Status: None (Preliminary result)   Collection Time: 04/16/20 12:24 AM   Specimen: BLOOD  Result Value Ref Range Status   Specimen Description BLOOD RIGHT [facility]  Final   Special Requests   Final    BOTTLES DRAWN AEROBIC AND ANAEROBIC Blood Culture adequate volume   Culture   Final    NO GROWTH 1 DAY Performed at [facility], [address], [place], [place] [address]    Report Status  PENDING  Incomplete  CULTURE, BLOOD (ROUTINE X 2) w Reflex to ID Panel     Status: None (Preliminary result)   Collection Time: 04/16/20 12:25 AM   Specimen: BLOOD  Result Value Ref Range Status   Specimen Description BLOOD LEFT [facility]  Final   Special Requests   Final    BOTTLES DRAWN AEROBIC AND ANAEROBIC Blood Culture adequate volume   Culture   Final    NO GROWTH 1 DAY Performed at [facility], [address] [place], [place] [address]    Report Status PENDING  Incomplete    RADIOLOGY:  [place] EKG SITE RITE  Result Date: 04/17/2020 If Site Rite image not attached, placement could not be confirmed due to current cardiac rhythm.    CODE STATUS:     Code Status Orders  (From admission, onward)         Start     Ordered   04/13/20 1828  Full code  Continuous        04/13/20 1827        Code Status History    Date Active Date Inactive Code Status Order ID Comments User Context   06/29/2019 1251 07/02/2019 1927 Full Code [id number]  Alford Highland, MD ED   06/10/2018 0643 06/12/2018 1816 Full Code [id number]  Mansy, Vernetta Honey, MD ED  08/05/2017 2245 08/10/2017 1553 Full Code [id number]  Cammy CopaMaier, Angela, MD Inpatient   10/24/2016 0110 10/25/2016 1807 Full Code [id number]  Oralia ManisWillis, David, MD Inpatient   Advance Care Planning Activity       TOTAL TIME TAKING CARE OF THIS PATIENT: *40* minutes.    Enedina FinnerSona Patel M.D  [facility]    CC: Primary care physician; Jaclyn Shaggyate, Denny C, MD

## 2020-04-17 NOTE — Care Management Important Message (Signed)
Important Message  Patient Details  Name: Alison Price MRN: [id number] Date of Birth: 1945/04/15   Medicare Important Message Given:  Yes  Reviewed with patient via room phone due to isolation status.  Copy of Medicare IM mailed to patient's home address on file.    Johnell Comings 04/17/2020, 11:32 AM

## 2020-04-17 NOTE — Progress Notes (Signed)
Peripherally Inserted Central Catheter Placement  The IV Nurse has discussed with the patient and/or persons authorized to consent for the patient, the purpose of this procedure and the potential benefits and risks involved with this procedure.  The benefits include less needle sticks, lab draws from the catheter, and the patient may be discharged home with the catheter. Risks include, but not limited to, infection, bleeding, blood clot (thrombus formation), and puncture of an artery; nerve damage and irregular heartbeat and possibility to perform a PICC exchange if needed/ordered by physician.  Alternatives to this procedure were also discussed.  Bard Power PICC patient education guide, fact sheet on infection prevention and patient information card has been provided to patient /or left at bedside.    PICC Placement Documentation  PICC Single Lumen 04/17/20 PICC Right Basilic 40 cm 0 cm (Active)  Indication for Insertion or Continuance of Line Home intravenous therapies (PICC only) 04/17/20 1311  Exposed Catheter (cm) 0 cm 04/17/20 1311  Site Assessment Clean;Dry;Intact 04/17/20 1311  Line Status Flushed;Blood return noted 04/17/20 1311  Dressing Type Transparent 04/17/20 1311  Dressing Status Clean;Dry;Intact 04/17/20 1311  Antimicrobial disc in place? Yes 04/17/20 1311  Dressing Intervention New dressing 04/17/20 1311  Dressing Change Due 04/24/20 04/17/20 1311       [name] 04/17/2020, 1:12 PM

## 2020-04-17 NOTE — Discharge Instructions (Signed)
PICC line care per protocol  Pt will go to the same day surgery per instruction for IV abx therapy  Dr Richardo Hanks has tentatively scheduled to get Ureteroscopy for march 11th

## 2020-04-17 NOTE — Progress Notes (Signed)
Alison Price to be D/C'd home per MD order.  Discussed prescriptions and follow up appointments with the patient. Prescriptions given to patient, medication list explained in detail. Pt verbalized understanding.  Vitals:   04/16/20 2338 04/17/20 0437  BP: 140/74 135/73  Pulse: 72 72  Resp: 16 20  Temp: 98.1 F (36.7 C) 98 F (36.7 C)  SpO2: 100% 100%    Skin clean, dry and intact without evidence of skin break down, no evidence of skin tears noted. IV catheter discontinued intact. Site without signs and symptoms of complications. Dressing and pressure applied. Pt denies pain at this time. No complaints noted.  An After Visit Summary was printed and given to the patient. Patient escorted via WC, and D/C home via private auto.  Sierra A Cloer

## 2020-04-17 NOTE — Treatment Plan (Signed)
Diagnosis: ESBL e.coli bacteremia Baseline Creatinine <1    Allergies  Allergen Reactions  . Codeine Nausea And Vomiting  . Sulfa Antibiotics Other (See Comments)    Trudie Buckler SYNDROME    OPAT Orders Discharge antibiotics: Ertapenem 1 gram IV every 24 hours until 05/01/20   [facility] Care Per Protocol:  Labs weekly on Thursday  while on IV antibiotics: _X_ CBC with differential __ BMP _X_ CMP _ _X_ Please pull PIC at completion of IV antibiotics _ Fax weekly labs to [phone] appt on 05/01/20 at 11.30am  Call [phone] with any questions

## 2020-04-17 NOTE — Care Management (Signed)
Patient to be discharged today with PICC in place.  Will be coming daily to day surgery center to receive her IV antibiotics.   Per MD no TOC needs  ID is working with Ty Hilts at day surgery for all arrangements Please consult TOC if indicated

## 2020-04-18 ENCOUNTER — Other Ambulatory Visit: Payer: Self-pay

## 2020-04-18 ENCOUNTER — Ambulatory Visit
Admit: 2020-04-18 | Discharge: 2020-04-18 | Disposition: A | Discharge status: Home / Self Care | Payer: Medicare Other | Attending: Infectious Diseases | Admitting: Infectious Diseases

## 2020-04-18 DIAGNOSIS — B9629 Other Escherichia coli [E. coli] as the cause of diseases classified elsewhere: Secondary | ICD-10-CM | POA: Diagnosis not present

## 2020-04-18 DIAGNOSIS — Z1612 Extended spectrum beta lactamase (ESBL) resistance: Secondary | ICD-10-CM | POA: Diagnosis not present

## 2020-04-18 DIAGNOSIS — R7881 Bacteremia: Secondary | ICD-10-CM | POA: Insufficient documentation

## 2020-04-18 MED ORDER — SODIUM CHLORIDE 0.9 % IV SOLN
1.0000 g | INTRAVENOUS | Status: DC
  Administered 2020-04-18: 1000 mg via INTRAVENOUS
  Filled 2020-04-18 (×2): qty 1

## 2020-04-18 MED ORDER — HEPARIN SOD (PORK) LOCK FLUSH 100 UNIT/ML IV SOLN: 250.0000 [IU] | INTRAVENOUS | Status: AC | PRN

## 2020-04-18 MED ORDER — HEPARIN SOD (PORK) LOCK FLUSH 100 UNIT/ML IV SOLN
INTRAVENOUS | Status: AC
  Administered 2020-04-18: 250 [IU]
  Filled 2020-04-18: qty 5

## 2020-04-19 ENCOUNTER — Ambulatory Visit
Admission: RE | Admit: 2020-04-19 | Discharge: 2020-04-19 | Disposition: A | Discharge status: Home / Self Care | Payer: Medicare Other | Source: Ambulatory Visit | Attending: Infectious Diseases | Admitting: Infectious Diseases

## 2020-04-19 DIAGNOSIS — Z1612 Extended spectrum beta lactamase (ESBL) resistance: Secondary | ICD-10-CM | POA: Insufficient documentation

## 2020-04-19 DIAGNOSIS — R7881 Bacteremia: Secondary | ICD-10-CM | POA: Insufficient documentation

## 2020-04-19 DIAGNOSIS — B9620 Unspecified Escherichia coli [E. coli] as the cause of diseases classified elsewhere: Secondary | ICD-10-CM | POA: Diagnosis not present

## 2020-04-19 MED ORDER — SODIUM CHLORIDE 0.9 % IV SOLN
1.0000 g | Freq: Once | INTRAVENOUS | Status: AC
  Administered 2020-04-19: 1000 mg via INTRAVENOUS
  Filled 2020-04-19: qty 1

## 2020-04-19 MED ORDER — HEPARIN SOD (PORK) LOCK FLUSH 100 UNIT/ML IV SOLN: 250.0000 [IU] | INTRAVENOUS | Status: AC | PRN

## 2020-04-19 MED ORDER — HEPARIN SOD (PORK) LOCK FLUSH 100 UNIT/ML IV SOLN
INTRAVENOUS | Status: AC
  Administered 2020-04-19: 250 [IU]
  Filled 2020-04-19: qty 5

## 2020-04-20 ENCOUNTER — Ambulatory Visit
Admit: 2020-04-20 | Discharge: 2020-04-20 | Disposition: A | Discharge status: Home / Self Care | Payer: Medicare Other | Attending: Infectious Diseases | Admitting: Infectious Diseases

## 2020-04-20 ENCOUNTER — Other Ambulatory Visit: Payer: Self-pay

## 2020-04-20 DIAGNOSIS — B998 Other infectious disease: Secondary | ICD-10-CM | POA: Diagnosis not present

## 2020-04-20 DIAGNOSIS — B9620 Unspecified Escherichia coli [E. coli] as the cause of diseases classified elsewhere: Secondary | ICD-10-CM | POA: Diagnosis present

## 2020-04-20 MED ORDER — HEPARIN SOD (PORK) LOCK FLUSH 100 UNIT/ML IV SOLN
INTRAVENOUS | Status: AC
  Administered 2020-04-20: 250 [IU]
  Filled 2020-04-20: qty 5

## 2020-04-20 MED ORDER — SODIUM CHLORIDE 0.9 % IV SOLN
1.0000 g | Freq: Once | INTRAVENOUS | Status: AC
  Administered 2020-04-20: 1000 mg via INTRAVENOUS
  Filled 2020-04-20: qty 1

## 2020-04-20 MED ORDER — HEPARIN SOD (PORK) LOCK FLUSH 100 UNIT/ML IV SOLN: 250.0000 [IU] | INTRAVENOUS | Status: AC | PRN

## 2020-04-21 ENCOUNTER — Ambulatory Visit
Admission: RE | Admit: 2020-04-21 | Discharge: 2020-04-21 | Disposition: A | Discharge status: Home / Self Care | Payer: Medicare Other | Source: Ambulatory Visit | Attending: Infectious Diseases | Admitting: Infectious Diseases

## 2020-04-21 DIAGNOSIS — B998 Other infectious disease: Secondary | ICD-10-CM | POA: Diagnosis not present

## 2020-04-21 LAB — CULTURE, BLOOD (ROUTINE X 2)
Culture: NO GROWTH
Culture: NO GROWTH
Special Requests: ADEQUATE
Special Requests: ADEQUATE

## 2020-04-21 MED ORDER — SODIUM CHLORIDE 0.9 % IV SOLN
1.0000 g | INTRAVENOUS | Status: DC
  Administered 2020-04-21: 1000 mg via INTRAVENOUS
  Filled 2020-04-21 (×2): qty 1

## 2020-04-21 MED ORDER — HEPARIN SOD (PORK) LOCK FLUSH 100 UNIT/ML IV SOLN
250.0000 [IU] | INTRAVENOUS | Status: AC | PRN
  Administered 2020-04-21: 250 [IU]
  Filled 2020-04-21: qty 5

## 2020-04-22 ENCOUNTER — Other Ambulatory Visit: Payer: Self-pay

## 2020-04-22 ENCOUNTER — Ambulatory Visit
Admission: RE | Admit: 2020-04-22 | Discharge: 2020-04-22 | Disposition: A | Discharge status: Home / Self Care | Payer: Medicare Other | Source: Ambulatory Visit | Attending: Infectious Diseases | Admitting: Infectious Diseases

## 2020-04-22 DIAGNOSIS — B9620 Unspecified Escherichia coli [E. coli] as the cause of diseases classified elsewhere: Secondary | ICD-10-CM | POA: Diagnosis not present

## 2020-04-22 DIAGNOSIS — N390 Urinary tract infection, site not specified: Secondary | ICD-10-CM | POA: Insufficient documentation

## 2020-04-22 DIAGNOSIS — R7881 Bacteremia: Secondary | ICD-10-CM | POA: Diagnosis not present

## 2020-04-22 DIAGNOSIS — Z1612 Extended spectrum beta lactamase (ESBL) resistance: Secondary | ICD-10-CM | POA: Insufficient documentation

## 2020-04-22 MED ORDER — HEPARIN SOD (PORK) LOCK FLUSH 100 UNIT/ML IV SOLN
INTRAVENOUS | Status: AC
  Administered 2020-04-22: 250 [IU] via INTRAVENOUS
  Filled 2020-04-22: qty 5

## 2020-04-22 MED ORDER — SODIUM CHLORIDE FLUSH 0.9 % IV SOLN
INTRAVENOUS | Status: AC
  Filled 2020-04-22: qty 10

## 2020-04-22 MED ORDER — SODIUM CHLORIDE FLUSH 0.9 % IV SOLN
INTRAVENOUS | Status: AC
  Administered 2020-04-22: 10 mL
  Filled 2020-04-22: qty 10

## 2020-04-22 MED ORDER — HEPARIN SOD (PORK) LOCK FLUSH 100 UNIT/ML IV SOLN: 250.0000 [IU] | Freq: Once | INTRAVENOUS | Status: AC

## 2020-04-22 MED ORDER — SODIUM CHLORIDE 0.9 % IV SOLN
1.0000 g | Freq: Once | INTRAVENOUS | Status: AC
  Administered 2020-04-22: 1000 mg via INTRAVENOUS

## 2020-04-23 ENCOUNTER — Ambulatory Visit
Admit: 2020-04-23 | Discharge: 2020-04-23 | Disposition: A | Discharge status: Home / Self Care | Payer: Medicare Other | Source: Ambulatory Visit | Attending: Infectious Diseases | Admitting: Infectious Diseases

## 2020-04-23 DIAGNOSIS — B9620 Unspecified Escherichia coli [E. coli] as the cause of diseases classified elsewhere: Secondary | ICD-10-CM | POA: Insufficient documentation

## 2020-04-23 DIAGNOSIS — N390 Urinary tract infection, site not specified: Secondary | ICD-10-CM | POA: Diagnosis present

## 2020-04-23 DIAGNOSIS — R7881 Bacteremia: Secondary | ICD-10-CM | POA: Insufficient documentation

## 2020-04-23 DIAGNOSIS — Z1612 Extended spectrum beta lactamase (ESBL) resistance: Secondary | ICD-10-CM | POA: Insufficient documentation

## 2020-04-23 MED ORDER — HEPARIN SOD (PORK) LOCK FLUSH 100 UNIT/ML IV SOLN
INTRAVENOUS | Status: AC
  Filled 2020-04-23: qty 5

## 2020-04-23 MED ORDER — SODIUM CHLORIDE 0.9 % IV SOLN
1.0000 g | INTRAVENOUS | Status: DC
  Administered 2020-04-23: 1000 mg via INTRAVENOUS
  Filled 2020-04-23 (×2): qty 1

## 2020-04-23 MED ORDER — HEPARIN SOD (PORK) LOCK FLUSH 100 UNIT/ML IV SOLN
250.0000 [IU] | Freq: Once | INTRAVENOUS | Status: AC
  Administered 2020-04-23: 250 [IU] via INTRAVENOUS

## 2020-04-24 ENCOUNTER — Other Ambulatory Visit: Payer: Self-pay

## 2020-04-24 ENCOUNTER — Other Ambulatory Visit: Payer: Self-pay | Admitting: Urology

## 2020-04-24 ENCOUNTER — Other Ambulatory Visit: Payer: Self-pay | Admitting: Radiology

## 2020-04-24 ENCOUNTER — Ambulatory Visit
Admit: 2020-04-24 | Discharge: 2020-04-24 | Disposition: A | Discharge status: Home / Self Care | Payer: Medicare Other | Attending: Infectious Diseases | Admitting: Infectious Diseases

## 2020-04-24 DIAGNOSIS — N201 Calculus of ureter: Secondary | ICD-10-CM

## 2020-04-24 DIAGNOSIS — Z1612 Extended spectrum beta lactamase (ESBL) resistance: Secondary | ICD-10-CM | POA: Diagnosis present

## 2020-04-24 MED ORDER — HEPARIN SOD (PORK) LOCK FLUSH 100 UNIT/ML IV SOLN
INTRAVENOUS | Status: AC
  Filled 2020-04-24: qty 5

## 2020-04-24 MED ORDER — SODIUM CHLORIDE 0.9 % IV SOLN
1.0000 g | INTRAVENOUS | Status: DC
  Filled 2020-04-24: qty 1

## 2020-04-24 MED ORDER — HEPARIN SOD (PORK) LOCK FLUSH 100 UNIT/ML IV SOLN
500.0000 [IU] | Freq: Once | INTRAVENOUS | Status: AC
  Administered 2020-04-24: 250 [IU] via INTRAVENOUS

## 2020-04-24 MED ORDER — HEPARIN SOD (PORK) LOCK FLUSH 100 UNIT/ML IV SOLN: 500.0000 [IU] | Freq: Once | INTRAVENOUS | Status: DC

## 2020-04-24 MED ORDER — MEROPENEM IV (FOR PTA / DISCHARGE USE ONLY): 500.0000 mg | Freq: Once | INTRAVENOUS | Status: DC

## 2020-04-24 MED ORDER — SODIUM CHLORIDE 0.9 % IV SOLN
1.0000 g | INTRAVENOUS | Status: DC
  Administered 2020-04-24: 1000 mg via INTRAVENOUS
  Filled 2020-04-24 (×2): qty 1

## 2020-04-24 MED ORDER — HEPARIN SOD (PORK) LOCK FLUSH 100 UNIT/ML IV SOLN: 250.0000 [IU] | Freq: Once | INTRAVENOUS | Status: DC

## 2020-04-25 ENCOUNTER — Other Ambulatory Visit: Payer: Self-pay

## 2020-04-25 ENCOUNTER — Other Ambulatory Visit: Admission: RE | Admit: 2020-04-25 | Payer: Medicare Other | Source: Ambulatory Visit

## 2020-04-25 ENCOUNTER — Other Ambulatory Visit: Payer: Self-pay | Admitting: Infectious Diseases

## 2020-04-25 ENCOUNTER — Other Ambulatory Visit
Admission: RE | Admit: 2020-04-25 | Discharge: 2020-04-25 | Disposition: A | Discharge status: Home / Self Care | Payer: Medicare Other | Source: Ambulatory Visit | Attending: Urology | Admitting: Urology

## 2020-04-25 ENCOUNTER — Ambulatory Visit
Admit: 2020-04-25 | Discharge: 2020-04-25 | Disposition: A | Discharge status: Home / Self Care | Payer: Medicare Other | Attending: Infectious Diseases | Admitting: Infectious Diseases

## 2020-04-25 DIAGNOSIS — Z20822 Contact with and (suspected) exposure to covid-19: Secondary | ICD-10-CM | POA: Insufficient documentation

## 2020-04-25 DIAGNOSIS — R197 Diarrhea, unspecified: Secondary | ICD-10-CM

## 2020-04-25 DIAGNOSIS — Z01812 Encounter for preprocedural laboratory examination: Secondary | ICD-10-CM | POA: Diagnosis present

## 2020-04-25 HISTORY — DX: Pneumonia, unspecified organism: J18.9

## 2020-04-25 HISTORY — DX: Dyspnea, unspecified: R06.00

## 2020-04-25 HISTORY — DX: Other specified postprocedural states: Z98.890

## 2020-04-25 HISTORY — DX: Other complications of anesthesia, initial encounter: T88.59XA

## 2020-04-25 HISTORY — DX: Nausea with vomiting, unspecified: R11.2

## 2020-04-25 HISTORY — DX: Personal history of urinary calculi: Z87.442

## 2020-04-25 LAB — CBC
HCT: 29 % — ABNORMAL LOW (ref 36.0–46.0)
Hemoglobin: 9.5 g/dL — ABNORMAL LOW (ref 12.0–15.0)
MCH: 30 pg (ref 26.0–34.0)
MCHC: 32.8 g/dL (ref 30.0–36.0)
MCV: 91.5 fL (ref 80.0–100.0)
Platelets: 221 10*3/uL (ref 150–400)
RBC: 3.17 MIL/uL — ABNORMAL LOW (ref 3.87–5.11)
RDW: 14.2 % (ref 11.5–15.5)
WBC: 6 10*3/uL (ref 4.0–10.5)
nRBC: 0 % (ref 0.0–0.2)

## 2020-04-25 LAB — COMPREHENSIVE METABOLIC PANEL
ALT: 10 U/L (ref 0–44)
AST: 23 U/L (ref 15–41)
Albumin: 3.7 g/dL (ref 3.5–5.0)
Alkaline Phosphatase: 83 U/L (ref 38–126)
Anion gap: 11 (ref 5–15)
BUN: 14 mg/dL (ref 8–23)
CO2: 25 mmol/L (ref 22–32)
Calcium: 9.2 mg/dL (ref 8.9–10.3)
Chloride: 102 mmol/L (ref 98–111)
Creatinine, Ser: 0.85 mg/dL (ref 0.44–1.00)
GFR, Estimated: >60 mL/min (ref >60)
Glucose, Bld: 84 mg/dL (ref 70–99)
Potassium: 3.7 mmol/L (ref 3.5–5.1)
Sodium: 138 mmol/L (ref 135–145)
Total Bilirubin: 0.6 mg/dL (ref 0.3–1.2)
Total Protein: 7.6 g/dL (ref 6.5–8.1)

## 2020-04-25 LAB — C DIFFICILE QUICK SCREEN W PCR REFLEX
C Diff antigen: NEGATIVE
C Diff interpretation: NOT DETECTED
C Diff toxin: NEGATIVE

## 2020-04-25 LAB — SARS CORONAVIRUS 2 BY RT PCR (HOSPITAL ORDER, PERFORMED IN ~~LOC~~ HOSPITAL LAB): SARS Coronavirus 2: NEGATIVE

## 2020-04-25 MED ORDER — SODIUM CHLORIDE 0.9 % IV SOLN
1.0000 g | Freq: Once | INTRAVENOUS | Status: AC
  Administered 2020-04-25: 1000 mg via INTRAVENOUS
  Filled 2020-04-25: qty 1

## 2020-04-25 MED ORDER — HEPARIN SOD (PORK) LOCK FLUSH 100 UNIT/ML IV SOLN
INTRAVENOUS | Status: AC
  Administered 2020-04-25: 500 [IU]
  Filled 2020-04-25: qty 5

## 2020-04-25 NOTE — Pre-Procedure Instructions (Signed)
Patient very resistant to receiving pre surgery instructions, voices that she knows all this and doesn't want to be talked to like a child.This Clinical research associate unable to complete instructions. Paper copy of instructions  To be given to patient when she comes for covid test today, She was instructed to call if she has any questions.

## 2020-04-25 NOTE — Progress Notes (Signed)
Pt here in the day surgery for Iv antibiotic- ertapenem - she has diarrhea- will send cbc/cmp and stool for cdiff.

## 2020-04-25 NOTE — Patient Instructions (Addendum)
Your procedure is scheduled on: 04/27/2020- Friday Report to the Registration Desk on the 1st floor of the [facility]. To find out your arrival time, please call [phone] between 1PM - 3PM on: 04/26/2020  REMEMBER: Instructions that are not followed completely may result in serious medical risk, up to and including death; or upon the discretion of your surgeon and anesthesiologist your surgery may need to be rescheduled.  Do not eat food or fluid after midnight the night before surgery.  No gum chewing, lozengers or hard candies.   TAKE THESE MEDICATIONS THE MORNING OF SURGERY WITH A SIP OF WATER:  - amLODipine (NORVASC) 5 MG tablet - escitalopram (LEXAPRO) 5 MG tablet - morphine (MS CONTIN) 60 MG 12 hr tablet - Oxycodone HCl 10 MG TABS - pantoprazole (PROTONIX) 40 MG tablet, take one the night before and one on the morning of surgery - helps to prevent nausea after    surgery.)  One week prior to surgery: Stop Anti-inflammatories (NSAIDS) such as Advil, Aleve, Ibuprofen, Motrin, Naproxen, Naprosyn and Aspirin based products such as Excedrin, Goodys Powder, BC Powder.  Stop ANY OVER THE COUNTER supplements until after surgery.  No Alcohol for 24 hours before or after surgery.  No Smoking including e-cigarettes for 24 hours prior to surgery.  No chewable tobacco products for at least 6 hours prior to surgery.  No nicotine patches on the day of surgery.  Do not use any "recreational" drugs for at least a week prior to your surgery.  Please be advised that the combination of cocaine and anesthesia may have negative outcomes, up to and including death. If you test positive for cocaine, your surgery will be cancelled.  On the morning of surgery brush your teeth with toothpaste and water, you may rinse your mouth with mouthwash if you wish. Do not swallow any toothpaste or mouthwash.  Do not wear jewelry, make-up, hairpins, clips or nail polish.  Do not wear lotions, powders,  or perfumes.   Do not shave body from the neck down 48 hours prior to surgery just in case you cut yourself which could leave a site for infection.  Also, freshly shaved skin may become irritated if using the CHG soap.  Contact lenses, hearing aids and dentures may not be worn into surgery.  Do not bring valuables to the hospital. [facility] is not responsible for any missing/lost belongings or valuables.   Notify your doctor if there is any change in your medical condition (cold, fever, infection).  Wear comfortable clothing (specific to your surgery type) to the hospital.  Plan for stool softeners for home use; pain medications have a tendency to cause constipation. You can also help prevent constipation by eating foods high in fiber such as fruits and vegetables and drinking plenty of fluids as your diet allows.  After surgery, you can help prevent lung complications by doing breathing exercises.  Take deep breaths and cough every 1-2 hours. Your doctor may order a device called an Incentive Spirometer to help you take deep breaths. When coughing or sneezing, hold a pillow firmly against your incision with both hands. This is called "splinting." Doing this helps protect your incision. It also decreases belly discomfort.  If you are being admitted to the hospital overnight, leave your suitcase in the car. After surgery it may be brought to your room.  If you are being discharged the day of surgery, you will not be allowed to drive home. You will need a responsible adult (  18 years or older) to drive you home and stay with you that night.   If you are taking public transportation, you will need to have a responsible adult (18 years or older) with you. Please confirm with your physician that it is acceptable to use public transportation.   Please call the Pre-admissions Testing Dept. at [phone] if you have any questions about these instructions.  Surgery Visitation  Policy:  Patients undergoing a surgery or procedure may have one family member or support person with them as long as that person is not COVID-19 positive or experiencing its symptoms.  That person may remain in the waiting area during the procedure.  Inpatient Visitation:    Visiting hours are 7 a.m. to 8 p.m. Inpatients will be allowed two visitors daily. The visitors may change each day during the patient's stay. No visitors under the age of 34. Any visitor under the age of 63 must be accompanied by an adult. The visitor must pass COVID-19 screenings, use hand sanitizer when entering and exiting the patient's room and wear a mask at all times, including in the patient's room. Patients must also wear a mask when staff or their visitor are in the room. Masking is required regardless of vaccination status.

## 2020-04-26 ENCOUNTER — Encounter: Payer: Self-pay | Admitting: Urology

## 2020-04-26 ENCOUNTER — Ambulatory Visit
Admit: 2020-04-26 | Discharge: 2020-04-26 | Disposition: A | Discharge status: Home / Self Care | Payer: Medicare Other | Source: Ambulatory Visit | Attending: Infectious Diseases | Admitting: Infectious Diseases

## 2020-04-26 ENCOUNTER — Ambulatory Visit (INDEPENDENT_AMBULATORY_CARE_PROVIDER_SITE_OTHER): Payer: Medicare Other | Admitting: Urology

## 2020-04-26 ENCOUNTER — Encounter: Payer: Self-pay | Admitting: Infectious Diseases

## 2020-04-26 ENCOUNTER — Ambulatory Visit: Payer: Medicare Other | Attending: Infectious Diseases | Admitting: Infectious Diseases

## 2020-04-26 VITALS — BP 124/68 | HR 76 | Ht 68.0 in | Wt 140.0 lb

## 2020-04-26 VITALS — BP 110/65 | HR 81 | Wt 139.8 lb

## 2020-04-26 DIAGNOSIS — Z79899 Other long term (current) drug therapy: Secondary | ICD-10-CM | POA: Diagnosis not present

## 2020-04-26 DIAGNOSIS — Z87442 Personal history of urinary calculi: Secondary | ICD-10-CM | POA: Insufficient documentation

## 2020-04-26 DIAGNOSIS — Z1612 Extended spectrum beta lactamase (ESBL) resistance: Secondary | ICD-10-CM | POA: Diagnosis not present

## 2020-04-26 DIAGNOSIS — N136 Pyonephrosis: Secondary | ICD-10-CM | POA: Insufficient documentation

## 2020-04-26 DIAGNOSIS — N201 Calculus of ureter: Secondary | ICD-10-CM

## 2020-04-26 DIAGNOSIS — R7881 Bacteremia: Secondary | ICD-10-CM | POA: Diagnosis not present

## 2020-04-26 DIAGNOSIS — Z7901 Long term (current) use of anticoagulants: Secondary | ICD-10-CM | POA: Insufficient documentation

## 2020-04-26 DIAGNOSIS — Z87891 Personal history of nicotine dependence: Secondary | ICD-10-CM | POA: Insufficient documentation

## 2020-04-26 DIAGNOSIS — B9620 Unspecified Escherichia coli [E. coli] as the cause of diseases classified elsewhere: Secondary | ICD-10-CM | POA: Insufficient documentation

## 2020-04-26 DIAGNOSIS — N390 Urinary tract infection, site not specified: Secondary | ICD-10-CM | POA: Insufficient documentation

## 2020-04-26 DIAGNOSIS — I10 Essential (primary) hypertension: Secondary | ICD-10-CM | POA: Diagnosis not present

## 2020-04-26 LAB — URINALYSIS, COMPLETE
Bilirubin, UA: NEGATIVE
Glucose, UA: NEGATIVE
Nitrite, UA: NEGATIVE
Specific Gravity, UA: 1.02 (ref 1.005–1.030)
Urobilinogen, Ur: 0.2 mg/dL (ref 0.2–1.0)
pH, UA: 5 (ref 5.0–7.5)

## 2020-04-26 LAB — MICROSCOPIC EXAMINATION: RBC, Urine: >30 /hpf — AB (ref 0–2)

## 2020-04-26 MED ORDER — SODIUM CHLORIDE FLUSH 0.9 % IV SOLN
INTRAVENOUS | Status: AC
  Filled 2020-04-26: qty 10

## 2020-04-26 MED ORDER — HEPARIN SOD (PORK) LOCK FLUSH 100 UNIT/ML IV SOLN
INTRAVENOUS | Status: AC
  Administered 2020-04-26: 250 [IU]
  Filled 2020-04-26: qty 5

## 2020-04-26 MED ORDER — SODIUM CHLORIDE 0.9 % IV SOLN
1.0000 g | Freq: Once | INTRAVENOUS | Status: AC
  Administered 2020-04-26: 1000 mg via INTRAVENOUS
  Filled 2020-04-26: qty 1

## 2020-04-26 MED ORDER — HEPARIN SOD (PORK) LOCK FLUSH 100 UNIT/ML IV SOLN: 250.0000 [IU] | INTRAVENOUS | Status: AC | PRN

## 2020-04-26 NOTE — Progress Notes (Signed)
Alison Price: Alison Price  DOB: [birth year]  MRN: [id number]  Date/Time: 04/26/2020 1:46 PM   Subjective:   ?Patient is here with her friend. Alison LingoConstance A Fratto is a 75 y.o. female with a history of hypertension, renal stones, osteoporosis is here for follow-up after recent hospitalization and discharge.  [facility]2020 with sepsis from a urinary stone causing obstruction and upstream hydronephrosis.  She underwent emergent stent placement.  Urine and blood culture positive for ESBL E. coli and she was sent home on IV ertapenem after getting meropenem in the hospital.  She goes to day surgery to get the IV antibiotic every day.  Her friend takes her there.  Yesterday a stool test was sent for C. difficile because of complaints of diarrhea and it was negative.  As per patient the diarrhea was secondary to her taking mineral oil because of previous constipation. Patient does not have any fever or rashes or shortness of breath or cough.  Her friend thinks that she is more forgetful She has an appointment to see her PCP tomorrow.  She also has surgery scheduled tomorrow with urologist for laser lithotripsy and stent exchange.  She will continue with IV ertapenem 04/29/2020.   Past Medical History:  Diagnosis Date  . Anemia   . Arthritis   . Chronic pain   . Complication of anesthesia   . Dyspnea   . GERD (gastroesophageal reflux disease)   . History of kidney stones   . Hypertension   . Osteoporosis   . Pneumonia   . PONV (postoperative nausea and vomiting)   . Stevens-Johnson syndrome [facility](HCC)     Past Surgical History:  Procedure Laterality Date  . ABDOMINAL HYSTERECTOMY     partial  . BACK SURGERY    . CYSTOSCOPY WITH STENT PLACEMENT Right 04/13/2020   Procedure: CYSTOSCOPY WITH STENT PLACEMENT;  Surgeon: Sondra ComeSninsky, Brian C, MD;  Location: [facility] ORS;  Service: Urology;  Laterality: Right;  . ESOPHAGOGASTRODUODENOSCOPY N/A 06/30/2019   Procedure:  ESOPHAGOGASTRODUODENOSCOPY (EGD);  Surgeon: Toledo, Boykin Nearingeodoro K, MD;  Location: [facility] ENDOSCOPY;  Service: Gastroenterology;  Laterality: N/A;  . FOOT SURGERY    . FRACTURE SURGERY     left hip repair  . HAND SURGERY     THUMB REPAIR  . HEMORRHOID SURGERY    . INTRAMEDULLARY (IM) NAIL INTERTROCHANTERIC Left 08/06/2017   Procedure: INTRAMEDULLARY (IM) NAIL INTERTROCHANTRIC;  Surgeon: Signa KellPatel, Sunny, MD;  Location: [facility] ORS;  Service: Orthopedics;  Laterality: Left;  . POSTERIOR REPAIR      Social History   Socioeconomic History  . Marital status: Divorced    Spouse name: Not on file  . Number of children: Not on file  . Years of education: Not on file  . Highest education level: Not on file  Occupational History  . Not on file  Tobacco Use  . Smoking status: Former Smoker    Quit date: 02/27/1986    Years since quitting: 34.1  . Smokeless tobacco: Never Used  Vaping Use  . Vaping Use: Never used  Substance and Sexual Activity  . Alcohol use: No  . Drug use: No  . Sexual activity: Not Currently    Birth control/protection: Surgical, None  Other Topics Concern  . Not on file  Social History Narrative  . Not on file   Social Determinants of Health   Financial Resource Strain: Not on file  Food Insecurity: Not on file  Transportation Needs: Not on file  Physical Activity: Not  on file  Stress: Not on file  Social Connections: Not on file  Intimate Partner Violence: Not on file    Family History  Problem Relation Age of Onset  . Diabetes Paternal Grandmother   . Uterine cancer Maternal Aunt   . Stomach cancer Maternal Aunt   . Alcohol abuse Father   . Breast cancer Neg Hx   . Ovarian cancer Neg Hx   . Colon cancer Neg Hx   . Heart disease Neg Hx    Allergies  Allergen Reactions  . Codeine Nausea And Vomiting  . Sulfa Antibiotics Other (See Comments)    STEVEN JOHNSON SYNDROME   I? Current Outpatient Medications  Medication Sig Dispense Refill  . amLODipine  (NORVASC) 5 MG tablet Take 5 mg by mouth daily.    . butalbital-acetaminophen-caffeine (FIORICET, ESGIC) 50-325-40 MG tablet Take 1-2 tablets by mouth every 6 (six) hours as needed for headache or migraine.     . Cholecalciferol (VITAMIN D) 2000 units CAPS Take 6,000 Units by mouth daily.     . Choline Fenofibrate (FENOFIBRIC ACID) 135 MG CPDR Take 135 mg by mouth daily.   3  . dimenhyDRINATE (DRAMAMINE) 50 MG tablet Take 50 mg by mouth every 8 (eight) hours as needed for nausea.     Marland Kitchen escitalopram (LEXAPRO) 5 MG tablet Take 5 mg by mouth daily.    . fexofenadine (ALLEGRA) 180 MG tablet Take 180 mg by mouth daily.    . fluticasone (FLONASE) 50 MCG/ACT nasal spray Place 2 sprays into both nostrils daily as needed for allergies or rhinitis.     Marland Kitchen lisinopril (ZESTRIL) 20 MG tablet Take 20 mg by mouth daily.    . meclizine (ANTIVERT) 25 MG tablet Take 25 mg by mouth 2 (two) times daily as needed for dizziness.    Marland Kitchen morphine (MS CONTIN) 60 MG 12 hr tablet Take 1 tablet (60 mg total) by mouth 3 (three) times daily. 90 tablet 0  . Multiple Vitamin (MULTIVITAMIN) tablet Take 1 tablet by mouth daily.    . Oxycodone HCl 10 MG TABS Take 10 mg by mouth 3 (three) times daily.    . pantoprazole (PROTONIX) 40 MG tablet Take 1 tablet (40 mg total) by mouth daily. 30 tablet 0  . alum & mag hydroxide-simeth (MAALOX PLUS) 400-400-40 MG/5ML suspension Take 30 mLs by mouth every 4 (four) hours as needed for indigestion.  (Patient not taking: Reported on 04/26/2020)    . feeding supplement, ENSURE ENLIVE, (ENSURE ENLIVE) LIQD Take 237 mLs by mouth 3 (three) times daily between meals. (Patient not taking: Reported on 04/26/2020) 21330 mL 12  . ferrous sulfate 325 (65 FE) MG tablet Take 325 mg by mouth 3 (three) times daily with meals. (Patient not taking: Reported on 04/26/2020)    . nystatin (MYCOSTATIN) 100000 UNIT/ML suspension Take 5 mLs (500,000 Units total) by mouth 4 (four) times daily. 60 mL 0  . ondansetron (ZOFRAN  ODT) 4 MG disintegrating tablet Take 1 tablet (4 mg total) by mouth every 8 (eight) hours as needed for nausea or vomiting. (Patient not taking: Reported on 04/26/2020) 12 tablet 0  . polyethylene glycol (MIRALAX / GLYCOLAX) 17 g packet Take 17 g by mouth daily. 30 each 0  . potassium chloride (KLOR-CON) 10 MEQ tablet Take 1 tablet (10 mEq total) by mouth daily. (Patient not taking: Reported on 04/26/2020) 5 tablet 0  . vitamin C (ASCORBIC ACID) 500 MG tablet Take 500 mg by mouth 3 (three) times daily.  anemia- give with ferrous sulfate to potentiate iron absorption     No current facility-administered medications for this visit.   Facility-Administered Medications Ordered in Other Visits  Medication Dose Route Frequency Provider Last Rate Last Admin  . sodium chloride flush 0.9 % injection              Abtx:  Anti-infectives (From admission, onward)   None      REVIEW OF SYSTEMS:  Const: negative fever, negative chills, negative weight loss Eyes: negative diplopia or visual changes, negative eye pain ENT: negative coryza, negative sore throat Resp: negative cough, hemoptysis, dyspnea Cards: negative for chest pain, palpitations, lower extremity edema GU: negative for frequency, dysuria and hematuria GI: Negative for abdominal pain, diarrhea, bleeding, constipation Skin: negative for rash and pruritus Heme: negative for easy bruising and gum/nose bleeding MS: negative for myalgias, arthralgias, back pain and muscle weakness Neurolo:negative for headaches, dizziness, vertigo, has memory problems and friends have noticed her disorganization. Psych: negative for feelings of anxiety, depression  Endocrine: negative for thyroid, diabetes Allergy/Immunology- negative for any medication or food allergies ? Pertinent Positives include : Objective:  VITALS:  BP 110/65 (BP Location: Left Arm)   Pulse 81   Wt 139 lb 12.4 oz (63.4 kg)   BMI 21.25 kg/m  PHYSICAL EXAM:  General: Alert,  cooperative, no distress, appears stated age.  Pale Head: Normocephalic, without obvious abnormality, atraumatic. Eyes: Conjunctivae clear, anicteric sclerae. Pupils are equal ENT Nares normal. No drainage or sinus tenderness. Lips, mucosa, and tongue normal. No Thrush Neck: Supple, symmetrical, no adenopathy, thyroid: non tender no carotid bruit and no JVD. Back: Kyphosis Lungs: Clear to auscultation bilaterally. No Wheezing or Rhonchi. No rales. Heart: Regular rate and rhythm, no murmur, rub or gallop. Abdomen: Soft, non-tender,not distended. Bowel sounds normal. No masses Extremities: Right PICC line atraumatic, no cyanosis. No edema. No clubbing Skin: No rashes or lesions. Or bruising Lymph: Cervical, supraclavicular normal. Neurologic: Grossly non-focal Pertinent Labs Lab Results CBC    Component Value Date/Time   WBC 6.0 04/25/2020 1421   RBC 3.17 (L) 04/25/2020 1421   HGB 9.5 (L) 04/25/2020 1421   HGB 10.9 (L) 05/04/2013 1120   HCT 29.0 (L) 04/25/2020 1421   HCT 31.2 (L) 03/29/2013 1340   PLT 221 04/25/2020 1421   PLT 155 03/29/2013 1340   MCV 91.5 04/25/2020 1421   MCV 90 03/29/2013 1340   MCH 30.0 04/25/2020 1421   MCHC 32.8 04/25/2020 1421   RDW 14.2 04/25/2020 1421   RDW 14.1 03/29/2013 1340   LYMPHSABS 0.9 06/10/2018 0306   LYMPHSABS 1.2 03/29/2013 1340   MONOABS 0.2 06/10/2018 0306   MONOABS 0.3 03/29/2013 1340   EOSABS 0.0 06/10/2018 0306   EOSABS 0.2 03/29/2013 1340   BASOSABS 0.0 06/10/2018 0306   BASOSABS 0.0 03/29/2013 1340    CMP Latest Ref Rng & Units 04/25/2020 04/14/2020 04/13/2020  Glucose 70 - 99 mg/dL 84 924(Q) 683(M)  BUN 8 - 23 mg/dL 14 19(Q) 22  Creatinine 0.44 - 1.00 mg/dL 2.22 9.79(G) 9.21  Sodium 135 - 145 mmol/L 138 134(L) 136  Potassium 3.5 - 5.1 mmol/L 3.7 4.0 3.8  Chloride 98 - 111 mmol/L 102 98 100  CO2 22 - 32 mmol/L 25 26 28   Calcium 8.9 - 10.3 mg/dL 9.2 9.2 9.7  Total Protein 6.5 - 8.1 g/dL 7.6 - 7.7  Total Bilirubin 0.3 - 1.2  mg/dL 0.6 - 0.8  Alkaline Phos 38 - 126 U/L 83 - 91  AST 15 - 41 U/L 23 - 21  ALT 0 - 44 U/L 10 - 10      Microbiology: Recent Results (from the past 240 hour(s))  SARS Coronavirus 2 by RT PCR (hospital order, performed in [facility] hospital lab) Nasopharyngeal Nasopharyngeal Swab     Status: None   Collection Time: 04/25/20  2:41 PM   Specimen: Nasopharyngeal Swab  Result Value Ref Range Status   SARS Coronavirus 2 NEGATIVE NEGATIVE Final    Comment: (NOTE) SARS-CoV-2 target nucleic acids are NOT DETECTED.  The SARS-CoV-2 RNA is generally detectable in upper and lower respiratory specimens during the acute phase of infection. The lowest concentration of SARS-CoV-2 viral copies this assay can detect is 250 copies / mL. A negative result does not preclude SARS-CoV-2 infection and should not be used as the sole basis for treatment or other patient management decisions.  A negative result may occur with improper specimen collection / handling, submission of specimen other than nasopharyngeal swab, presence of viral mutation(s) within the areas targeted by this assay, and inadequate number of viral copies (<250 copies / mL). A negative result must be combined with clinical observations, patient history, and epidemiological information.  Fact Sheet for Patients:   BoilerBrush.com.cy  Fact Sheet for Healthcare Providers: https://pope.com/  This test is not yet approved or  cleared by the [place] FDA and has been authorized for detection and/or diagnosis of SARS-CoV-2 by FDA under an Emergency Use Authorization (EUA).  This EUA will remain in effect (meaning this test can be used) for the duration of the COVID-19 declaration under Section 564(b)(1) of the Act, 21 U.S.C. section 360bbb-3(b)(1), unless the authorization is terminated or revoked sooner.  Performed at [facility], [address] [place], [place]  [address]   C Difficile Quick Screen w PCR reflex     Status: None   Collection Time: 04/25/20  2:52 PM   Specimen: STOOL  Result Value Ref Range Status   C Diff antigen NEGATIVE NEGATIVE Final   C Diff toxin NEGATIVE NEGATIVE Final   C Diff interpretation No C. difficile detected.  Final    Comment: Performed at [facility], [address] [place], [place] [address]  Microscopic Examination     Status: Abnormal   Collection Time: 04/26/20  9:21 AM   Urine  Result Value Ref Range Status   WBC, UA 0-5 0 - 5 /hpf Final   RBC >30 (A) 0 - 2 /hpf Final   Epithelial Cells (non renal) 0-10 0 - 10 /hpf Final   Renal Epithel, UA 0-10 (A) None seen /hpf Final   Casts Present (A) None seen /lpf Final   Cast Type Hyaline casts N/A Final   Bacteria, UA Few None seen/Few Final     ?Impression/recommendation  ?ESBL bacteremia on ertapenem ESBL E. coli complicated UTI with right ureteric stone with right hydronephrosis.  Underwent stent placement when she was in the hospital. Tomorrow she is undergoing lithotripsy and exchange of stents. Her last day of ertapenem is on 04/29/2020. PICC will be removed after that. Discussed the management with urologist and day surgery nurse in charge.  History of chronic renal stones ? There is a concern as she is forgetful and for possible early neurocognitive impairment as per the patient's friend.  She has an appointment with her PCP tomorrow which they will change it for next week.  ___________________________________________________ Discussed the management with the patient and her friend.  Note:  This  document was prepared using Systems analyst and may include unintentional dictation errors.

## 2020-04-26 NOTE — Patient Instructions (Signed)
You are here for follow up- you will complete IV ertapenem on 04/29/20 and the PICC will be removed at the day surgery after that.please follow up with Dr.Tate and keep the urology appt tomorrow for the stone removal.

## 2020-04-26 NOTE — Patient Instructions (Signed)
Laser Therapy for Kidney Stones Laser therapy for kidney stones is a procedure to break up small, hard mineral deposits that form in the kidney (kidney stones). The procedure is done using a device that produces a focused beam of light (laser). The laser breaks up kidney stones into pieces that are small enough to be passed out of the body through urination or removed from the body during the procedure. You may need laser therapy if you have kidney stones that are painful or block your urinary tract. This procedure is done by inserting a tube (ureteroscope) into your kidney through the urethral opening. The urethra is the part of the body that drains urine from the bladder. In women, the urethra opens above the vaginal opening. In men, the urethra opens at the tip of the penis. The ureteroscope is inserted through the urethra, and surgical instruments are moved through the bladder and the muscular tube that connects the kidney to the bladder (ureter) until they reach the kidney. Tell a health care provider about:  Any allergies you have.  All medicines you are taking, including vitamins, herbs, eye drops, creams, and over-the-counter medicines.  Any problems you or family members have had with anesthetic medicines.  Any blood disorders you have.  Any surgeries you have had.  Any medical conditions you have.  Whether you are pregnant or may be pregnant. What are the risks? Generally, this is a safe procedure. However, problems may occur, including:  Infection.  Bleeding.  Allergic reactions to medicines.  Damage to the urethra, bladder, or ureter.  Urinary tract infection (UTI).  Narrowing of the urethra (urethral stricture).  Difficulty passing urine.  Blockage of the kidney caused by a fragment of kidney stone. What happens before the procedure? Medicines  Ask your health care provider about: ? Changing or stopping your regular medicines. This is especially important if you  are taking diabetes medicines or blood thinners. ? Taking medicines such as aspirin and ibuprofen. These medicines can thin your blood. Do not take these medicines unless your health care provider tells you to take them. ? Taking over-the-counter medicines, vitamins, herbs, and supplements. Eating and drinking Follow instructions from your health care provider about eating and drinking, which may include:  8 hours before the procedure - stop eating heavy meals or foods, such as meat, fried foods, or fatty foods.  6 hours before the procedure - stop eating light meals or foods, such as toast or cereal.  6 hours before the procedure - stop drinking milk or drinks that contain milk.  2 hours before the procedure - stop drinking clear liquids. Staying hydrated Follow instructions from your health care provider about hydration, which may include:  Up to 2 hours before the procedure - you may continue to drink clear liquids, such as water, clear fruit juice, black coffee, and plain tea.   General instructions  You may have a physical exam before the procedure. You may also have tests, such as imaging tests and blood or urine tests.  If your ureter is too narrow, your health care provider may place a soft, flexible tube (stent) inside of it. The stent may be placed days or weeks before your laser therapy procedure.  Plan to have someone take you home from the hospital or clinic.  If you will be going home right after the procedure, plan to have someone stay with you for 24 hours.  Do not use any products that contain nicotine or tobacco for at least   4 weeks before the procedure. These products include cigarettes, e-cigarettes, and chewing tobacco. If you need help quitting, ask your health care provider.  Ask your health care provider: ? How your surgical site will be marked or identified. ? What steps will be taken to help prevent infection. These may include:  Removing hair at the surgery  site.  Washing skin with a germ-killing soap.  Taking antibiotic medicine. What happens during the procedure?  An IV will be inserted into one of your veins.  You will be given one or more of the following: ? A medicine to help you relax (sedative). ? A medicine to numb the area (local anesthetic). ? A medicine to make you fall asleep (general anesthetic).  A ureteroscope will be inserted into your urethra. The ureteroscope will send images to a video screen in the operating room to guide your surgeon to the area of your kidney that will be treated.  A small, flexible tube will be threaded through the ureteroscope and into your bladder and ureter, up to your kidney.  The laser device will be inserted into your kidney through the tube. Your surgeon will pulse the laser on and off to break up kidney stones.  A surgical instrument that has a tiny wire basket may be inserted through the tube into your kidney to remove the pieces of broken kidney stone. The procedure may vary among health care providers and hospitals.   What happens after the procedure?  Your blood pressure, heart rate, breathing rate, and blood oxygen level will be monitored until you leave the hospital or clinic.  You will be given pain medicine as needed.  You may continue to receive antibiotics.  You may have a stent temporarily placed in your ureter.  Do not drive for 24 hours if you were given a sedative during your procedure.  You may be given a strainer to collect any stone fragments that you pass in your urine. Your health care provider may have these tested. Summary  Laser therapy for kidney stones is a procedure to break up kidney stones into pieces that are small enough to be passed out of the body through urination or removed during the procedure.  Follow instructions from your health care provider about eating and drinking before the procedure.  During the procedure, the ureteroscope will send images  to a video screen to guide your surgeon to the area of your kidney that will be treated.  Do not drive for 24 hours if you were given a sedative during your procedure. This information is not intended to replace advice given to you by your health care provider. Make sure you discuss any questions you have with your health care provider. Document Revised: 10/15/2017 Document Reviewed: 10/15/2017 Elsevier Patient Education  2021 [employer]  

## 2020-04-26 NOTE — Progress Notes (Addendum)
   04/26/2020 9:33 AM   Alison Price 23-Sep-1945 [id number]  Reason for visit: Follow up right ureteral stone and sepsis from urinary source  HPI: I saw Alison Price and her friend in urology clinic for follow-up after undergoing emergent right ureteral stent placement.  She is a 75 year old female with extensive history of kidney stones who presented to the ED on 04/13/2020 with sepsis from urinary source and 1 cm right proximal ureteral stone with upstream hydronephrosis and underwent emergent right ureteral stent placement.  Her urine and blood cultures ultimately grew ESBL E. coli sensitive only to imipenem and pip/tazo.  She has been on IV meropenem infusions coordinated by infectious disease and is on day 13/14.  Urinalysis today is benign with 0-5 WBCs, greater than 30 RBCs, few bacteria, nitrite negative, trace leukocytes.  I personally viewed and interpreted her CT scan from 04/13/2020, as well as her distant CT from 2013.  The latest CT shows the 1 cm right proximal ureteral stone, now that has been stented, and there is a 1.5 cm nonobstructive left lower pole stone that is essentially stable from 2013.  We have surgery scheduled tomorrow for right ureteroscopy, laser lithotripsy, and stent exchange. We specifically discussed the risks ureteroscopy including bleeding, infection/sepsis, stent related symptoms including flank pain/urgency/frequency/incontinence/dysuria, ureteral injury, inability to access stone, or need for staged or additional procedures.  I recommended observation for her left lower pole stone that is essentially stable and unchanged since 2013.  They are in agreement.  Proceed with right ureteroscopy, laser lithotripsy, stent placement tomorrow, will continue meropenem and get an additional dose preop tomorrow   Sondra Come, MD  [facility] [address] [place], [place] [address] [phone]

## 2020-04-27 ENCOUNTER — Encounter
Admission: RE | Disposition: A | Discharge status: Home / Self Care | Payer: Self-pay | Source: Home / Self Care | Attending: Urology

## 2020-04-27 ENCOUNTER — Ambulatory Visit
Admit: 2020-04-27 | Discharge: 2020-04-27 | Disposition: A | Discharge status: Home / Self Care | Payer: Medicare Other | Attending: Infectious Diseases | Admitting: Infectious Diseases

## 2020-04-27 ENCOUNTER — Ambulatory Visit: Payer: Medicare Other | Admitting: Certified Registered Nurse Anesthetist

## 2020-04-27 ENCOUNTER — Ambulatory Visit: Payer: Medicare Other

## 2020-04-27 ENCOUNTER — Other Ambulatory Visit: Payer: Self-pay

## 2020-04-27 ENCOUNTER — Ambulatory Visit
Admission: RE | Admit: 2020-04-27 | Discharge: 2020-04-27 | Disposition: A | Discharge status: Home / Self Care | Payer: Medicare Other | Attending: Urology | Admitting: Urology

## 2020-04-27 ENCOUNTER — Encounter: Payer: Self-pay | Admitting: Urology

## 2020-04-27 DIAGNOSIS — Z882 Allergy status to sulfonamides status: Secondary | ICD-10-CM | POA: Insufficient documentation

## 2020-04-27 DIAGNOSIS — Z885 Allergy status to narcotic agent status: Secondary | ICD-10-CM | POA: Insufficient documentation

## 2020-04-27 DIAGNOSIS — N132 Hydronephrosis with renal and ureteral calculous obstruction: Secondary | ICD-10-CM | POA: Diagnosis present

## 2020-04-27 DIAGNOSIS — N201 Calculus of ureter: Secondary | ICD-10-CM

## 2020-04-27 DIAGNOSIS — Z87891 Personal history of nicotine dependence: Secondary | ICD-10-CM | POA: Insufficient documentation

## 2020-04-27 DIAGNOSIS — Z87442 Personal history of urinary calculi: Secondary | ICD-10-CM | POA: Insufficient documentation

## 2020-04-27 HISTORY — PX: CYSTOSCOPY/URETEROSCOPY/HOLMIUM LASER/STENT PLACEMENT: SHX6546

## 2020-04-27 LAB — CBC
HCT: 26.5 % — ABNORMAL LOW (ref 36.0–46.0)
Hemoglobin: 8.4 g/dL — ABNORMAL LOW (ref 12.0–15.0)
MCH: 30.2 pg (ref 26.0–34.0)
MCHC: 31.7 g/dL (ref 30.0–36.0)
MCV: 95.3 fL (ref 80.0–100.0)
Platelets: 166 10*3/uL (ref 150–400)
RBC: 2.78 MIL/uL — ABNORMAL LOW (ref 3.87–5.11)
RDW: 14.7 % (ref 11.5–15.5)
WBC: 4.6 10*3/uL (ref 4.0–10.5)
nRBC: 0 % (ref 0.0–0.2)

## 2020-04-27 SURGERY — CYSTOSCOPY/URETEROSCOPY/HOLMIUM LASER/STENT PLACEMENT
Anesthesia: General | Laterality: Right

## 2020-04-27 MED ORDER — LACTATED RINGERS IV SOLN: INTRAVENOUS | Status: DC

## 2020-04-27 MED ORDER — ACETAMINOPHEN 10 MG/ML IV SOLN
INTRAVENOUS | Status: DC | PRN
  Administered 2020-04-27: 1000 mg via INTRAVENOUS

## 2020-04-27 MED ORDER — HEPARIN SOD (PORK) LOCK FLUSH 100 UNIT/ML IV SOLN
INTRAVENOUS | Status: AC
  Filled 2020-04-27: qty 5

## 2020-04-27 MED ORDER — CHLORHEXIDINE GLUCONATE 0.12 % MT SOLN: 15.0000 mL | Freq: Once | OROMUCOSAL | Status: AC

## 2020-04-27 MED ORDER — IOHEXOL 180 MG/ML  SOLN
INTRAMUSCULAR | Status: DC | PRN
  Administered 2020-04-27: 20 mL

## 2020-04-27 MED ORDER — PROPOFOL 10 MG/ML IV BOLUS
INTRAVENOUS | Status: DC | PRN
  Administered 2020-04-27: 100 mg via INTRAVENOUS
  Administered 2020-04-27 (×2): 30 mg via INTRAVENOUS

## 2020-04-27 MED ORDER — SODIUM CHLORIDE 0.9 % IV SOLN
500.0000 mg | Freq: Once | INTRAVENOUS | Status: AC
  Administered 2020-04-27: 500 mg via INTRAVENOUS
  Filled 2020-04-27: qty 0.5

## 2020-04-27 MED ORDER — HEPARIN SOD (PORK) LOCK FLUSH 100 UNIT/ML IV SOLN
250.0000 [IU] | INTRAVENOUS | Status: AC | PRN
  Administered 2020-04-27: 250 [IU]

## 2020-04-27 MED ORDER — ONDANSETRON HCL 4 MG/2ML IJ SOLN: 4.0000 mg | Freq: Once | INTRAMUSCULAR | Status: DC | PRN

## 2020-04-27 MED ORDER — FENTANYL CITRATE (PF) 100 MCG/2ML IJ SOLN
INTRAMUSCULAR | Status: AC
  Filled 2020-04-27: qty 2

## 2020-04-27 MED ORDER — ROCURONIUM BROMIDE 100 MG/10ML IV SOLN
INTRAVENOUS | Status: DC | PRN
  Administered 2020-04-27: 40 mg via INTRAVENOUS
  Administered 2020-04-27 (×2): 10 mg via INTRAVENOUS

## 2020-04-27 MED ORDER — ONDANSETRON HCL 4 MG/2ML IJ SOLN
INTRAMUSCULAR | Status: DC | PRN
  Administered 2020-04-27: 4 mg via INTRAVENOUS

## 2020-04-27 MED ORDER — ORAL CARE MOUTH RINSE: 15.0000 mL | Freq: Once | OROMUCOSAL | Status: AC

## 2020-04-27 MED ORDER — SUGAMMADEX SODIUM 200 MG/2ML IV SOLN
INTRAVENOUS | Status: DC | PRN
  Administered 2020-04-27: 150 mg via INTRAVENOUS

## 2020-04-27 MED ORDER — CHLORHEXIDINE GLUCONATE 0.12 % MT SOLN
OROMUCOSAL | Status: AC
  Administered 2020-04-27: 15 mL via OROMUCOSAL
  Filled 2020-04-27: qty 15

## 2020-04-27 MED ORDER — ONDANSETRON HCL 4 MG/2ML IJ SOLN
INTRAMUSCULAR | Status: AC
  Filled 2020-04-27: qty 2

## 2020-04-27 MED ORDER — LIDOCAINE HCL (CARDIAC) PF 100 MG/5ML IV SOSY
PREFILLED_SYRINGE | INTRAVENOUS | Status: DC | PRN
  Administered 2020-04-27: 50 mg via INTRAVENOUS

## 2020-04-27 MED ORDER — DEXAMETHASONE SODIUM PHOSPHATE 10 MG/ML IJ SOLN
INTRAMUSCULAR | Status: DC | PRN
  Administered 2020-04-27: 5 mg via INTRAVENOUS

## 2020-04-27 MED ORDER — ACETAMINOPHEN 10 MG/ML IV SOLN
INTRAVENOUS | Status: AC
  Filled 2020-04-27: qty 100

## 2020-04-27 MED ORDER — PROPOFOL 500 MG/50ML IV EMUL
INTRAVENOUS | Status: DC | PRN
  Administered 2020-04-27: 100 ug/kg/min via INTRAVENOUS

## 2020-04-27 MED ORDER — FENTANYL CITRATE (PF) 100 MCG/2ML IJ SOLN
25.0000 ug | INTRAMUSCULAR | Status: DC | PRN
  Administered 2020-04-27 (×3): 25 ug via INTRAVENOUS

## 2020-04-27 MED ORDER — SODIUM CHLORIDE 0.9 % IV SOLN
1.0000 g | Freq: Once | INTRAVENOUS | Status: AC
  Administered 2020-04-27: 1000 mg via INTRAVENOUS
  Filled 2020-04-27: qty 1

## 2020-04-27 SURGICAL SUPPLY — 33 items
BAG DRAIN CYSTO-URO LG1000N (MISCELLANEOUS) ×2 IMPLANT
BASKET ZERO TIP 1.9FR (BASKET) ×2 IMPLANT
BRUSH SCRUB EZ 1% IODOPHOR (MISCELLANEOUS) ×2 IMPLANT
BSKT STON RTRVL ZERO TP 1.9FR (BASKET) ×1
CATH URET FLEX-TIP 2 LUMEN 10F (CATHETERS) IMPLANT
CATH URETL 5X70 OPEN END (CATHETERS) IMPLANT
CNTNR SPEC 2.5X3XGRAD LEK (MISCELLANEOUS)
CONT SPEC 4OZ STER OR WHT (MISCELLANEOUS)
CONT SPEC 4OZ STRL OR WHT (MISCELLANEOUS)
CONTAINER SPEC 2.5X3XGRAD LEK (MISCELLANEOUS) IMPLANT
DRAPE UTILITY 15X26 TOWEL STRL (DRAPES) ×2 IMPLANT
DRSG TEGADERM 2-3/8X2-3/4 SM (GAUZE/BANDAGES/DRESSINGS) ×2 IMPLANT
GLOVE SURG UNDER POLY LF SZ7.5 (GLOVE) ×2 IMPLANT
GOWN STRL REUS W/ TWL LRG LVL3 (GOWN DISPOSABLE) ×1 IMPLANT
GOWN STRL REUS W/ TWL XL LVL3 (GOWN DISPOSABLE) ×1 IMPLANT
GOWN STRL REUS W/TWL LRG LVL3 (GOWN DISPOSABLE) ×2
GOWN STRL REUS W/TWL XL LVL3 (GOWN DISPOSABLE) ×2
GUIDEWIRE STR DUAL SENSOR (WIRE) ×4 IMPLANT
INFUSOR MANOMETER BAG 3000ML (MISCELLANEOUS) ×2 IMPLANT
KIT TURNOVER CYSTO (KITS) ×2 IMPLANT
PACK CYSTO AR (MISCELLANEOUS) ×2 IMPLANT
SET CYSTO W/LG BORE CLAMP LF (SET/KITS/TRAYS/PACK) ×2 IMPLANT
SHEATH URETERAL 12FRX35CM (MISCELLANEOUS) IMPLANT
SOL .9 NS 3000ML IRR  AL (IV SOLUTION) ×1
SOL .9 NS 3000ML IRR AL (IV SOLUTION) ×1
SOL .9 NS 3000ML IRR UROMATIC (IV SOLUTION) ×1 IMPLANT
STENT URET 6FRX24 CONTOUR (STENTS) IMPLANT
STENT URET 6FRX26 CONTOUR (STENTS) ×2 IMPLANT
SURGILUBE 2OZ TUBE FLIPTOP (MISCELLANEOUS) ×2 IMPLANT
SYR 10ML LL (SYRINGE) ×2 IMPLANT
TRACTIP FLEXIVA PULSE ID 200 (Laser) ×2 IMPLANT
VALVE UROSEAL ADJ ENDO (VALVE) IMPLANT
WATER STERILE IRR 1000ML POUR (IV SOLUTION) ×2 IMPLANT

## 2020-04-27 NOTE — Discharge Instructions (Signed)

## 2020-04-27 NOTE — Anesthesia Preprocedure Evaluation (Signed)
Anesthesia Evaluation  Patient identified by MRN, date of birth, ID band Patient awake    Reviewed: Allergy & Precautions, H&P , NPO status , Patient's Chart, lab work & pertinent test results  History of Anesthesia Complications (+) PONVNegative for: history of anesthetic complications  Airway Mallampati: II  TM Distance: >3 FB     Dental  (+) Missing, Edentulous Lower, Dental Advidsory Given   Pulmonary neg pulmonary ROS, neg shortness of breath, neg sleep apnea, neg COPD, neg recent URI, former smoker,    breath sounds clear to auscultation       Cardiovascular Exercise Tolerance: Good hypertension, (-) angina(-) Past MI and (-) Cardiac Stents (-) dysrhythmias  Rate:Normal     Neuro/Psych PSYCHIATRIC DISORDERS Depression negative neurological ROS     GI/Hepatic Neg liver ROS, GERD  Controlled,  Endo/Other  negative endocrine ROS  Renal/GU Renal disease (kidney stone)     Musculoskeletal   Abdominal   Peds  Hematology  (+) Blood dyscrasia, anemia , Hgb 10.3   Anesthesia Other Findings Vomiting in pre-op  Past Medical History: No date: Anemia No date: Arthritis No date: Chronic pain No date: GERD (gastroesophageal reflux disease) No date: Hypertension No date: Osteoporosis No date: Stevens-Johnson syndrome (HCC)  Past Surgical History: No date: ABDOMINAL HYSTERECTOMY     Comment:  partial No date: BACK SURGERY 06/30/2019: ESOPHAGOGASTRODUODENOSCOPY; N/A     Comment:  Procedure: ESOPHAGOGASTRODUODENOSCOPY (EGD);  Surgeon:               Toledo, Boykin Nearing, MD;  Location: [facility] ENDOSCOPY;                Service: Gastroenterology;  Laterality: N/A; No date: FOOT SURGERY No date: HAND SURGERY     Comment:  THUMB REPAIR No date: HEMORRHOID SURGERY 08/06/2017: INTRAMEDULLARY (IM) NAIL INTERTROCHANTERIC; Left     Comment:  Procedure: INTRAMEDULLARY (IM) NAIL INTERTROCHANTRIC;                Surgeon: Signa Kell, MD;  Location: [facility] ORS;  Service:              Orthopedics;  Laterality: Left; No date: POSTERIOR REPAIR  BMI    Body Mass Index: 21.13 kg/m      Reproductive/Obstetrics negative OB ROS                             Anesthesia Physical  Anesthesia Plan  ASA: III  Anesthesia Plan: General   Post-op Pain Management:    Induction: Intravenous  PONV Risk Score and Plan: 4 or greater and Treatment may vary due to age or medical condition, TIVA and Propofol infusion  Airway Management Planned: Oral ETT  Additional Equipment:   Intra-op Plan:   Post-operative Plan: Extubation in OR  Informed Consent: I have reviewed the patients History and Physical, chart, labs and discussed the procedure including the risks, benefits and alternatives for the proposed anesthesia with the patient or authorized representative who has indicated his/her understanding and acceptance.     Dental Advisory Given  Plan Discussed with: Anesthesiologist, CRNA and Surgeon  Anesthesia Plan Comments:         Anesthesia Quick Evaluation

## 2020-04-27 NOTE — Op Note (Signed)
Date of procedure: 04/27/20  Preoperative diagnosis:  1. Right proximal ureteral stone  Postoperative diagnosis:  1. Same  Procedure: 1. Cystoscopy, right ureteroscopy, laser lithotripsy, basket extraction of stones, retrograde pyelogram with intraoperative interpretation, stent placement  Surgeon: Legrand Rams, MD  Anesthesia: General  Complications: None  Intraoperative findings:  1.  Normal cystoscopy 2.  Stone had a matrix-like covering and hard center, fragmented to dust, Event organiser extracted 3.  Kidney had drooping lily appearance with acute angle down into the renal pelvis from the UPJ  EBL: Minimal  Specimens: None  Drains: Right 6 French by 26 cm ureteral stent  Indication: Alison Price is a 75 y.o. patient with right proximal ureteral stone and sepsis from urinary source who previously underwent emergent stent placement and presents today for definitive management.  After reviewing the management options for treatment, they elected to proceed with the above surgical procedure(s). We have discussed the potential benefits and risks of the procedure, side effects of the proposed treatment, the likelihood of the patient achieving the goals of the procedure, and any potential problems that might occur during the procedure or recuperation. Informed consent has been obtained.  Description of procedure:  The patient was taken to the operating room and general anesthesia was induced. SCDs were placed for DVT prophylaxis. The patient was placed in the dorsal lithotomy position, prepped and draped in the usual sterile fashion, and preoperative antibiotics(ertapenem) were administered. A preoperative time-out was performed.   A 21 French rigid cystoscope was used to intubate the urethra and thorough cystoscopy was performed.  The bladder was grossly normal.  The right ureteral stent was grasped and pulled to the meatus and a sensor wire was used to intubate the  stent and passed up to the kidney under fluoroscopic vision.  The cystoscope was reinserted and a second safety wire was added.  A 12/14 French by 35 cm access sheath was advanced over the wire under fluoroscopic vision to the UPJ.  A single channel digital ureteroscope was advanced through the sheath up into the kidney.  There was a 8 mm stone that was covered with matrix-like material and had a very hard center.  A 242 m laser fiber on settings of 0.5 J and 40 Hz was used to methodically dust the stone.  Some of the matrix like material was basket extracted.  Thorough pyeloscopy revealed no other stones >14mm.    A retrograde pyelogram was performed from the proximal ureter and showed no extravasation or filling defects.  Careful pullback ureteroscopy demonstrated no ureteral injury or residual fragments.  The rigid cystoscope was backloaded over the wire and a 6 [place] by 26 cm ureteral stent was uneventfully placed with an excellent curl in the renal pelvis, as well as in the bladder.  Contrast was seen to drain through the side ports of the stent.  The bladder was drained and this concluded our procedure.  Disption: Stable to PACU  Plan: Follow-up in 1 week for stent removal Continue surveillance for large left lower pole stone  Legrand Rams, MD

## 2020-04-27 NOTE — Transfer of Care (Signed)
Immediate Anesthesia Transfer of Care Note  Patient: [facility]  Procedure(s) Performed: CYSTOSCOPY/URETEROSCOPY/HOLMIUM LASER/STENT EXCHANGE (Right )  Patient Location: PACU  Anesthesia Type:General  Level of Consciousness: awake, drowsy and patient cooperative  Airway & Oxygen Therapy: Patient Spontanous Breathing and Patient connected to face mask oxygen  Post-op Assessment: Report given to RN and Post -op Vital signs reviewed and stable  Post vital signs: Reviewed and stable  Last Vitals:  Vitals Value Taken Time  BP 138/54 04/27/20 1552  Temp 36.3 C 04/27/20 1552  Pulse 44 04/27/20 1557  Resp 11 04/27/20 1557  SpO2 97 % 04/27/20 1557  Vitals shown include unvalidated device data.  Last Pain:  Vitals:   04/27/20 1552  TempSrc:   PainSc: 0-No pain         Complications: No complications documented.

## 2020-04-27 NOTE — Anesthesia Procedure Notes (Signed)
Procedure Name: Intubation Date/Time: 04/27/2020 3:03 PM Performed by: Henrietta Hoover, CRNA Pre-anesthesia Checklist: Patient identified, Emergency Drugs available, Suction available and Patient being monitored Patient Re-evaluated:Patient Re-evaluated prior to induction Oxygen Delivery Method: Circle system utilized Preoxygenation: Pre-oxygenation with 100% oxygen Induction Type: IV induction Ventilation: Mask ventilation without difficulty Laryngoscope Size: 3 and McGraph Grade View: Grade I Tube type: Oral Tube size: 7.0 mm Number of attempts: 1 Airway Equipment and Method: Stylet and Video-laryngoscopy Placement Confirmation: ETT inserted through vocal cords under direct vision,  positive ETCO2 and breath sounds checked- equal and bilateral Secured at: 21 cm Tube secured with: Tape Dental Injury: Teeth and Oropharynx as per pre-operative assessment

## 2020-04-27 NOTE — Interval H&P Note (Signed)
UROLOGY H&P UPDATE  Agree with prior H&P dated 04/13/20.   Cardiac: RRR Lungs: CTA bilaterally  Laterality: Right Procedure: Right ureteroscopy, laser lithotripsy, stent placement  Urine: culture 2/25 with ESBL E Coli, has been on culture appropriate IV Ertapenem  We specifically discussed the risks ureteroscopy including bleeding, infection/sepsis, stent related symptoms including flank pain/urgency/frequency/incontinence/dysuria, ureteral injury, inability to access stone, or need for staged or additional procedures.   Sondra Come, MD 04/27/2020

## 2020-04-27 NOTE — Anesthesia Postprocedure Evaluation (Signed)
Anesthesia Post Note  Patient: Alison Price  Procedure(s) Performed: CYSTOSCOPY/URETEROSCOPY/HOLMIUM LASER/STENT EXCHANGE (Right )  Patient location during evaluation: PACU Anesthesia Type: General Level of consciousness: awake and alert Pain management: pain level controlled Vital Signs Assessment: post-procedure vital signs reviewed and stable Respiratory status: spontaneous breathing, nonlabored ventilation, respiratory function stable and patient connected to nasal cannula oxygen Cardiovascular status: blood pressure returned to baseline and stable Postop Assessment: no apparent nausea or vomiting Anesthetic complications: no   No complications documented.   Last Vitals:  Vitals:   04/27/20 1609 04/27/20 1615  BP:  (!) 148/73  Pulse: 60 63  Resp: 19 14  Temp:    SpO2: 98% 100%    Last Pain:  Vitals:   04/27/20 1615  TempSrc:   PainSc: 5                  Corinda Gubler

## 2020-04-28 ENCOUNTER — Encounter: Payer: Self-pay | Admitting: Urology

## 2020-04-28 ENCOUNTER — Ambulatory Visit
Admission: RE | Admit: 2020-04-28 | Discharge: 2020-04-28 | Disposition: A | Discharge status: Home / Self Care | Payer: Medicare Other | Source: Ambulatory Visit | Attending: Infectious Diseases | Admitting: Infectious Diseases

## 2020-04-28 DIAGNOSIS — Z452 Encounter for adjustment and management of vascular access device: Secondary | ICD-10-CM | POA: Insufficient documentation

## 2020-04-28 MED ORDER — HEPARIN SOD (PORK) LOCK FLUSH 100 UNIT/ML IV SOLN
INTRAVENOUS | Status: AC
  Filled 2020-04-28: qty 5

## 2020-04-28 MED ORDER — SODIUM CHLORIDE 0.9 % IV SOLN
1.0000 g | Freq: Once | INTRAVENOUS | Status: AC
  Administered 2020-04-28: 1000 mg via INTRAVENOUS
  Filled 2020-04-28: qty 1

## 2020-04-28 MED ORDER — HEPARIN SOD (PORK) LOCK FLUSH 100 UNIT/ML IV SOLN
250.0000 [IU] | INTRAVENOUS | Status: AC | PRN
  Administered 2020-04-28: 250 [IU]

## 2020-04-29 ENCOUNTER — Other Ambulatory Visit: Payer: Self-pay

## 2020-04-29 ENCOUNTER — Ambulatory Visit
Admission: RE | Admit: 2020-04-29 | Discharge: 2020-04-29 | Disposition: A | Discharge status: Home / Self Care | Payer: Medicare Other | Source: Ambulatory Visit | Attending: Infectious Diseases | Admitting: Infectious Diseases

## 2020-04-29 DIAGNOSIS — R7881 Bacteremia: Secondary | ICD-10-CM | POA: Diagnosis present

## 2020-04-29 DIAGNOSIS — B9629 Other Escherichia coli [E. coli] as the cause of diseases classified elsewhere: Secondary | ICD-10-CM | POA: Diagnosis not present

## 2020-04-29 MED ORDER — SODIUM CHLORIDE 0.9 % IV SOLN
1.0000 g | INTRAVENOUS | Status: AC
  Administered 2020-04-29: 1000 mg via INTRAVENOUS
  Filled 2020-04-29: qty 1

## 2020-04-29 MED ORDER — HEPARIN SOD (PORK) LOCK FLUSH 100 UNIT/ML IV SOLN
INTRAVENOUS | Status: AC
  Filled 2020-04-29: qty 5

## 2020-04-29 MED ORDER — HEPARIN SOD (PORK) LOCK FLUSH 100 UNIT/ML IV SOLN
250.0000 [IU] | Freq: Once | INTRAVENOUS | Status: AC
  Administered 2020-04-29: 250 [IU]

## 2020-04-30 ENCOUNTER — Telehealth: Payer: Self-pay | Admitting: Urology

## 2020-04-30 ENCOUNTER — Telehealth: Payer: Self-pay

## 2020-04-30 NOTE — Telephone Encounter (Signed)
I have sent a message to the day surgery for them to remove PICC- will follow up tomorrow. Thx

## 2020-04-30 NOTE — Telephone Encounter (Signed)
-----   Message from Sondra Come, MD sent at 04/27/2020  3:57 PM EST ----- Regarding: stent removal Please schedule stent removal in 5-10 days  Thanks Legrand Rams, MD 04/27/2020

## 2020-04-30 NOTE — Telephone Encounter (Signed)
Done

## 2020-04-30 NOTE — Telephone Encounter (Signed)
Received voicemail from Lu Duffel regarding picc line. States that patient received last injection of ertapenem on 3/13, but picc line was not removed. Per Leigh RN did not have orders to pull picc and was not sure if patient needed any additional doses or labs.  Per note patient was supposed to have picc removed at the day of surgery. Will forward message to MD for orders. Patient does not have home health.

## 2020-05-01 ENCOUNTER — Inpatient Hospital Stay: Payer: Medicare Other | Admitting: Infectious Diseases

## 2020-05-02 ENCOUNTER — Ambulatory Visit (INDEPENDENT_AMBULATORY_CARE_PROVIDER_SITE_OTHER): Payer: Medicare Other | Admitting: Urology

## 2020-05-02 ENCOUNTER — Ambulatory Visit
Admission: RE | Admit: 2020-05-02 | Discharge: 2020-05-02 | Disposition: A | Discharge status: Home / Self Care | Payer: Medicare Other | Source: Ambulatory Visit | Attending: Infectious Diseases | Admitting: Infectious Diseases

## 2020-05-02 ENCOUNTER — Encounter: Payer: Self-pay | Admitting: Urology

## 2020-05-02 ENCOUNTER — Other Ambulatory Visit: Payer: Self-pay

## 2020-05-02 VITALS — BP 158/80 | HR 72 | Ht 70.0 in | Wt 139.0 lb

## 2020-05-02 DIAGNOSIS — Z466 Encounter for fitting and adjustment of urinary device: Secondary | ICD-10-CM

## 2020-05-02 LAB — URINALYSIS, COMPLETE
Bilirubin, UA: NEGATIVE
Ketones, UA: NEGATIVE
Nitrite, UA: NEGATIVE
Specific Gravity, UA: 1.02 (ref 1.005–1.030)
Urobilinogen, Ur: 0.2 mg/dL (ref 0.2–1.0)
pH, UA: 5.5 (ref 5.0–7.5)

## 2020-05-02 LAB — MICROSCOPIC EXAMINATION
Bacteria, UA: NONE SEEN
RBC, Urine: >30 /hpf — AB (ref 0–2)

## 2020-05-02 NOTE — Patient Instructions (Signed)

## 2020-05-02 NOTE — Progress Notes (Signed)
Cystoscopy Procedure Note:  Indication: Stent removal s/p 04/27/2020 right ureteroscopy, laser lithotripsy, basket extraction of stones, ureteral stent placement  After informed consent and discussion of the procedure and its risks, [facility] was positioned and prepped in the standard fashion. Cystoscopy was performed with a flexible cystoscope. The stent was grasped with flexible graspers and removed in its entirety. The patient tolerated the procedure well.  Findings: Uncomplicated stent removal  Assessment and Plan: We discussed general stone prevention strategies including adequate hydration with goal of producing 2.5 L of urine daily, increasing citric acid intake, increasing calcium intake during high oxalate meals, minimizing animal protein, and decreasing salt intake. Information about dietary recommendations given today.   RTC 6 months with KUB prior for surveillance of left lower pole stone  Sondra Come, MD 05/02/2020

## 2020-05-02 NOTE — Progress Notes (Signed)
Patient comes in for PICC line removal. Patient placed supine with mask on. Patient turned head to the left and PICC was pulled using Vaseline gauze and 4x4 sterile gauze. Patient did bear down. Pressure applied to the sight. No bleeding noted. Patient discharge.

## 2020-05-21 NOTE — Addendum Note (Signed)
Encounter addended by: Rosalita Chessman, RN on: 05/21/2020 2:07 PM  Actions taken: Charge Capture section accepted

## 2020-05-21 NOTE — Addendum Note (Signed)
Encounter addended by: Rosalita Chessman, RN on: 05/21/2020 2:01 PM  Actions taken: Charge Capture section accepted

## 2020-05-21 NOTE — Addendum Note (Signed)
Encounter addended by: Rosalita Chessman, RN on: 05/21/2020 2:05 PM  Actions taken: Charge Capture section accepted

## 2020-05-21 NOTE — Addendum Note (Signed)
Encounter addended by: Rosalita Chessman, RN on: 05/21/2020 2:00 PM  Actions taken: Charge Capture section accepted

## 2020-05-21 NOTE — Addendum Note (Signed)
Encounter addended by: Rosalita Chessman, RN on: 05/21/2020 2:06 PM  Actions taken: Charge Capture section accepted

## 2020-05-21 NOTE — Addendum Note (Signed)
Encounter addended by: Rosalita Chessman, RN on: 05/21/2020 2:02 PM  Actions taken: Charge Capture section accepted

## 2020-05-21 NOTE — Addendum Note (Signed)
Encounter addended by: Rosalita Chessman, RN on: 05/21/2020 2:03 PM  Actions taken: Charge Capture section accepted

## 2020-10-16 ENCOUNTER — Ambulatory Visit: Payer: Medicare Other | Admitting: Urology

## 2020-10-22 NOTE — Progress Notes (Signed)
10/23/2020 4:48 PM   Alison Price Alison Price, Alison Price [id number]  Referring provider: Jaclyn Shaggy, MD [address]   [place],  [place] [address]  Urological history: 1. Nephrolithiasis -right URS 04/2020 -KUB stable left lower renal stones  Chief Complaint  Patient presents with   Nephrolithiasis    HPI: Alison Price is a 75 y.o. female who presents today for kidney stones with her friend, Leigh.    Patient is a poor historian secondary to her dementia.  She has circled on her review of system form lower abdominal pain, genital pain, genital swelling, nausea and chills.  When I asked for more specific questions regarding her symptoms, she is unable to answer my questions.  She states that she has been experiencing these for a long time.  She also states that she has pain across to her mid back that has been there for several months.  Patient denies any modifying or aggravating factors.  Patient denies any gross hematuria, dysuria or suprapubic/flank pain.  Patient denies any fevers, chills, nausea or vomiting.    KUB shows the two large renal calculi.   CATH UA > Alison Price, > Alison Price and moderate bacteria.    PVR Alison Price.    PMH: Past Medical History:  Diagnosis Date   Anemia    Arthritis    Chronic pain    Complication of anesthesia    Dyspnea    GERD (gastroesophageal reflux disease)    History of kidney stones    Hypertension    Osteoporosis    Pneumonia    PONV (postoperative nausea and vomiting)    Stevens-Johnson syndrome (HCC)     Surgical History: Past Surgical History:  Procedure Laterality Date   ABDOMINAL HYSTERECTOMY     partial   BACK SURGERY     CYSTOSCOPY WITH STENT PLACEMENT Right 04/13/2020   Procedure: CYSTOSCOPY WITH STENT PLACEMENT;  Surgeon: Sondra Come, MD;  Location: [facility];  Service: Urology;  Laterality: Right;   CYSTOSCOPY/URETEROSCOPY/HOLMIUM LASER/STENT PLACEMENT Right 04/27/2020   Procedure:  CYSTOSCOPY/URETEROSCOPY/HOLMIUM LASER/STENT EXCHANGE;  Surgeon: Sondra Come, MD;  Location: [facility];  Service: Urology;  Laterality: Right;   ESOPHAGOGASTRODUODENOSCOPY N/A 06/30/2019   Procedure: ESOPHAGOGASTRODUODENOSCOPY (EGD);  Surgeon: Toledo, Boykin Nearing, MD;  Location: [facility];  Service: Gastroenterology;  Laterality: N/A;   FOOT SURGERY     FRACTURE SURGERY     left hip repair   HAND SURGERY     THUMB REPAIR   HEMORRHOID SURGERY     INTRAMEDULLARY (IM) NAIL INTERTROCHANTERIC Left 08/06/2017   Procedure: INTRAMEDULLARY (IM) NAIL INTERTROCHANTRIC;  Surgeon: Signa Kell, MD;  Location: [facility];  Service: Orthopedics;  Laterality: Left;   POSTERIOR REPAIR      Home Medications:  Allergies as of 10/23/2020       Reactions   Codeine Nausea And Vomiting   Sulfa Antibiotics Other (See Comments)   Trudie Buckler SYNDROME        Medication List        Accurate as of October 23, 2020  4:48 PM. If you have any questions, ask your nurse or doctor.          STOP taking these medications    butalbital-acetaminophen-caffeine 50-325-40 MG tablet Commonly known as: FIORICET Stopped by: Mirah Nevins, PA-C   escitalopram 5 MG tablet Commonly known as: LEXAPRO Stopped by: Nekisha Mcdiarmid, PA-C   fluticasone 50 MCG/ACT nasal spray Commonly known as: FLONASE Stopped by: Michiel Cowboy,  PA-C   meclizine 25 MG tablet Commonly known as: ANTIVERT Stopped by: Neeva Trew, PA-C   ondansetron 4 MG disintegrating tablet Commonly known as: Zofran ODT Stopped by: Trino Higinbotham, PA-C   polyethylene glycol 17 g packet Commonly known as: MIRALAX / GLYCOLAX Stopped by: Loyde Orth, PA-C   vitamin C 500 MG tablet Commonly known as: ASCORBIC ACID Stopped by: Michiel Cowboy, PA-C       TAKE these medications    amLODipine 5 MG tablet Commonly known as: NORVASC Take 5 mg by mouth daily.   cefUROXime 500 MG tablet Commonly known as: CEFTIN Take 1  tablet (500 mg total) by mouth 2 (two) times daily with a meal. Started by: Michiel Cowboy, PA-C   dimenhyDRINATE 50 MG tablet Commonly known as: DRAMAMINE Take 50 mg by mouth every 8 (eight) hours as needed for nausea.   donepezil 5 MG tablet Commonly known as: ARICEPT Take 5 mg by mouth daily.   Fenofibric Acid 135 MG Cpdr Take 135 mg by mouth daily.   fexofenadine 180 MG tablet Commonly known as: ALLEGRA Take 180 mg by mouth daily.   lisinopril 20 MG tablet Commonly known as: ZESTRIL Take 20 mg by mouth daily.   multivitamin tablet Take 1 tablet by mouth daily.   nystatin 100000 UNIT/Price suspension Commonly known as: MYCOSTATIN Take 5 mLs (500,000 Units total) by mouth 4 (four) times daily.   Oxycodone HCl 10 MG Tabs Take 10 mg by mouth 3 (three) times daily.   pantoprazole 40 MG tablet Commonly known as: Protonix Take 1 tablet (40 mg total) by mouth daily.   Vitamin D 50 MCG (2000 UT) Caps Take 6,000 Units by mouth daily.        Allergies:  Allergies  Allergen Reactions   Codeine Nausea And Vomiting   Sulfa Antibiotics Other (See Comments)    STEVEN JOHNSON SYNDROME    Family History: Family History  Problem Relation Age of Onset   Diabetes Paternal Grandmother    Uterine cancer Maternal Aunt    Stomach cancer Maternal Aunt    Alcohol abuse Father    Breast cancer Neg Hx    Ovarian cancer Neg Hx    Colon cancer Neg Hx    Heart disease Neg Hx     Social History:  reports that she quit smoking about 34 years ago. Her smoking use included cigarettes. She has never used smokeless tobacco. She reports that she does not drink alcohol and does not use drugs.  ROS: Pertinent ROS in HPI  Physical Exam: BP (!) 101/57   Pulse 76   Ht 5\' 10"  (1.778 m)   Wt 132 lb 11.2 oz (60.2 kg)   BMI 19.04 kg/m   Constitutional:  Well nourished. Alert and oriented, No acute distress. HEENT: [place] AT, mask in place.  Trachea midline Cardiovascular: No clubbing,  cyanosis, or edema. Respiratory: Normal respiratory effort, no increased work of breathing. GU: No CVA tenderness.  No bladder fullness or masses.  Atrophic external genitalia, normal pubic hair distribution, no lesions.  Normal urethral meatus, no lesions, no prolapse, no discharge.   No urethral masses, tenderness and/or tenderness. No bladder fullness, tenderness or masses. Pale vagina mucosa, poor estrogen effect, no discharge, no lesions, fair pelvic support, grade I cystocele and no rectocele noted.  Anus and perineum are without rashes or lesions.     Neurologic: Grossly intact, no focal deficits, moving all 4 extremities. Psychiatric: Normal mood and affect.    Laboratory Data:  Lab Results  Component Value Date   WBC 4.6 04/27/2020   HGB 8.4 (L) 04/27/2020   HCT 26.5 (L) 04/27/2020   MCV 95.3 04/27/2020   PLT 166 04/27/2020    Lab Results  Component Value Date   CREATININE 0.85 04/25/2020    Lab Results  Component Value Date   AST 23 04/25/2020   Lab Results  Component Value Date   ALT 10 04/25/2020   Urinalysis Component     Latest Ref Rng & Units 10/23/2020  Specific Gravity, UA     1.005 - 1.030 1.020  pH, UA     5.0 - 7.5 5.5  Color, UA     Yellow Yellow  Appearance Ur     Clear Cloudy (A)  Leukocytes,UA     Negative 2+ (A)  Protein,UA     Negative/Trace 1+ (A)  Glucose, UA     Negative Negative  Ketones, UA     Negative Trace (A)  RBC, UA     Negative 3+ (A)  Bilirubin, UA     Negative Negative  Urobilinogen, Ur     0.2 - 1.0 mg/dL 1.0  Nitrite, UA     Negative Negative  Microscopic Examination      See below:   Component     Latest Ref Rng & Units 10/23/2020          WBC, UA     0 - 5 /hpf >Price (A)  RBC     0 - 2 /hpf >Price (A)  Epithelial Cells (non renal)     0 - 10 /hpf 0-10  Casts     None seen /lpf Present (A)  Cast Type     N/A Granular casts (A)  Bacteria, UA     None seen/Few Moderate (A)     I have reviewed the  labs.   Pertinent Imaging: CLINICAL DATA:  History of kidney stones, evaluate for removal of ureter stent   EXAM: ABDOMEN - 1 VIEW   COMPARISON:  CT abdomen and pelvis dated April 13, 2020   FINDINGS: Nonobstructive bowel gas pattern. Large calcification overlying the expected area the lower pole of the left kidney, measuring up to 2.6 cm. No calcifications seen over the expected course of the ureters. No ureter stents visualized.   Levocurvature of the lumbar spine.  Interbody spacers at L4-L5.   IMPRESSION: No ureter stents visualized.   Large calcification the left hemiabdomen, which correlates with large stone of the lower pole of the left kidney seen on prior CT.     Electronically Signed   By: Allegra Lai M.D.   On: 10/24/2020 14:22  I have independently reviewed the films.  See HPI.   Assessment & Plan:    1. Nephrolithiasis -left renal stones are stable  2. Suprapubic pain -CATH UA grossly infected -urine culture sent -started Ceftin 500 mg, twice daily - will adjust if necessary when culture results are available  3. Microscopic hematuria -CATH UA >Alison Price -likely secondary to nephrolithiasis and/or UTI -will recheck in one month  Return for keep appointment with Dr. Richardo Hanks in October.  These notes generated with voice recognition software. I apologize for typographical errors.  Michiel Cowboy, PA-C  [facility] [address]  [address] [place], [place] [address] [phone]

## 2020-10-23 ENCOUNTER — Ambulatory Visit
Admission: RE | Admit: 2020-10-23 | Discharge: 2020-10-23 | Disposition: A | Discharge status: Home / Self Care | Payer: Medicare Other | Source: Ambulatory Visit | Attending: Urology | Admitting: Urology

## 2020-10-23 ENCOUNTER — Other Ambulatory Visit: Payer: Self-pay

## 2020-10-23 ENCOUNTER — Ambulatory Visit
Admission: RE | Admit: 2020-10-23 | Discharge: 2020-10-23 | Disposition: A | Discharge status: Home / Self Care | Payer: Medicare Other | Attending: Urology | Admitting: Urology

## 2020-10-23 ENCOUNTER — Encounter: Payer: Self-pay | Admitting: Urology

## 2020-10-23 ENCOUNTER — Ambulatory Visit: Payer: Medicare Other | Admitting: Urology

## 2020-10-23 VITALS — BP 101/57 | HR 76 | Ht 70.0 in | Wt 132.7 lb

## 2020-10-23 DIAGNOSIS — R3129 Other microscopic hematuria: Secondary | ICD-10-CM

## 2020-10-23 DIAGNOSIS — Z466 Encounter for fitting and adjustment of urinary device: Secondary | ICD-10-CM

## 2020-10-23 DIAGNOSIS — N200 Calculus of kidney: Secondary | ICD-10-CM | POA: Diagnosis not present

## 2020-10-23 DIAGNOSIS — R102 Pelvic and perineal pain: Secondary | ICD-10-CM | POA: Diagnosis not present

## 2020-10-23 MED ORDER — CEFUROXIME AXETIL 500 MG PO TABS: 500.0000 mg | ORAL_TABLET | Freq: Two times a day (BID) | ORAL | 0 refills | Status: DC

## 2020-10-24 ENCOUNTER — Telehealth: Payer: Self-pay | Admitting: Urology

## 2020-10-24 ENCOUNTER — Other Ambulatory Visit: Payer: Medicare Other

## 2020-10-24 ENCOUNTER — Other Ambulatory Visit: Payer: Self-pay | Admitting: Family Medicine

## 2020-10-24 DIAGNOSIS — N200 Calculus of kidney: Secondary | ICD-10-CM

## 2020-10-24 LAB — MICROSCOPIC EXAMINATION
RBC, Urine: >30 /hpf — AB (ref 0–2)
WBC, UA: >30 /hpf — AB (ref 0–5)

## 2020-10-24 LAB — URINALYSIS, COMPLETE
Bilirubin, UA: NEGATIVE
Glucose, UA: NEGATIVE
Nitrite, UA: NEGATIVE
Specific Gravity, UA: 1.02 (ref 1.005–1.030)
Urobilinogen, Ur: 1 mg/dL (ref 0.2–1.0)
pH, UA: 5.5 (ref 5.0–7.5)

## 2020-10-24 NOTE — Telephone Encounter (Signed)
Patient notified and will come in today to leave a urine for culture.

## 2020-10-24 NOTE — Telephone Encounter (Signed)
Her urine didn't get sent for culture yesterday.  Would she be able to give [place] a specimen today?

## 2020-11-01 ENCOUNTER — Emergency Department: Payer: Medicare Other

## 2020-11-01 ENCOUNTER — Encounter: Payer: Self-pay | Admitting: Emergency Medicine

## 2020-11-01 ENCOUNTER — Other Ambulatory Visit: Payer: Self-pay

## 2020-11-01 ENCOUNTER — Inpatient Hospital Stay
Admission: EM | Admit: 2020-11-01 | Discharge: 2020-11-08 | DRG: 563 | Disposition: A | Discharge status: Skilled Nursing Facility | Payer: Medicare Other | Attending: Student in an Organized Health Care Education/Training Program | Admitting: Student in an Organized Health Care Education/Training Program

## 2020-11-01 DIAGNOSIS — R309 Painful micturition, unspecified: Secondary | ICD-10-CM | POA: Diagnosis present

## 2020-11-01 DIAGNOSIS — S42402A Unspecified fracture of lower end of left humerus, initial encounter for closed fracture: Secondary | ICD-10-CM | POA: Diagnosis present

## 2020-11-01 DIAGNOSIS — Z882 Allergy status to sulfonamides status: Secondary | ICD-10-CM

## 2020-11-01 DIAGNOSIS — S52502K Unspecified fracture of the lower end of left radius, subsequent encounter for closed fracture with nonunion: Secondary | ICD-10-CM | POA: Diagnosis not present

## 2020-11-01 DIAGNOSIS — Z79891 Long term (current) use of opiate analgesic: Secondary | ICD-10-CM

## 2020-11-01 DIAGNOSIS — Y92098 Other place in other non-institutional residence as the place of occurrence of the external cause: Secondary | ICD-10-CM

## 2020-11-01 DIAGNOSIS — G8929 Other chronic pain: Secondary | ICD-10-CM | POA: Diagnosis present

## 2020-11-01 DIAGNOSIS — S80211A Abrasion, right knee, initial encounter: Secondary | ICD-10-CM | POA: Diagnosis present

## 2020-11-01 DIAGNOSIS — D696 Thrombocytopenia, unspecified: Secondary | ICD-10-CM | POA: Diagnosis present

## 2020-11-01 DIAGNOSIS — Z20822 Contact with and (suspected) exposure to covid-19: Secondary | ICD-10-CM | POA: Diagnosis present

## 2020-11-01 DIAGNOSIS — F0390 Unspecified dementia without behavioral disturbance: Secondary | ICD-10-CM | POA: Diagnosis present

## 2020-11-01 DIAGNOSIS — S52202A Unspecified fracture of shaft of left ulna, initial encounter for closed fracture: Secondary | ICD-10-CM | POA: Diagnosis present

## 2020-11-01 DIAGNOSIS — S52502A Unspecified fracture of the lower end of left radius, initial encounter for closed fracture: Secondary | ICD-10-CM | POA: Diagnosis present

## 2020-11-01 DIAGNOSIS — N179 Acute kidney failure, unspecified: Secondary | ICD-10-CM | POA: Diagnosis not present

## 2020-11-01 DIAGNOSIS — W010XXA Fall on same level from slipping, tripping and stumbling without subsequent striking against object, initial encounter: Secondary | ICD-10-CM | POA: Diagnosis present

## 2020-11-01 DIAGNOSIS — S52602A Unspecified fracture of lower end of left ulna, initial encounter for closed fracture: Secondary | ICD-10-CM | POA: Diagnosis present

## 2020-11-01 DIAGNOSIS — R8271 Bacteriuria: Secondary | ICD-10-CM | POA: Diagnosis present

## 2020-11-01 DIAGNOSIS — K5900 Constipation, unspecified: Secondary | ICD-10-CM | POA: Diagnosis present

## 2020-11-01 DIAGNOSIS — Z23 Encounter for immunization: Secondary | ICD-10-CM

## 2020-11-01 DIAGNOSIS — M810 Age-related osteoporosis without current pathological fracture: Secondary | ICD-10-CM | POA: Diagnosis present

## 2020-11-01 DIAGNOSIS — Y9301 Activity, walking, marching and hiking: Secondary | ICD-10-CM | POA: Diagnosis present

## 2020-11-01 DIAGNOSIS — K219 Gastro-esophageal reflux disease without esophagitis: Secondary | ICD-10-CM | POA: Diagnosis present

## 2020-11-01 DIAGNOSIS — Z79899 Other long term (current) drug therapy: Secondary | ICD-10-CM

## 2020-11-01 DIAGNOSIS — W19XXXA Unspecified fall, initial encounter: Secondary | ICD-10-CM | POA: Diagnosis not present

## 2020-11-01 DIAGNOSIS — S82141A Displaced bicondylar fracture of right tibia, initial encounter for closed fracture: Secondary | ICD-10-CM | POA: Diagnosis present

## 2020-11-01 DIAGNOSIS — S62109A Fracture of unspecified carpal bone, unspecified wrist, initial encounter for closed fracture: Secondary | ICD-10-CM | POA: Diagnosis present

## 2020-11-01 DIAGNOSIS — M1990 Unspecified osteoarthritis, unspecified site: Secondary | ICD-10-CM | POA: Diagnosis present

## 2020-11-01 DIAGNOSIS — Z8049 Family history of malignant neoplasm of other genital organs: Secondary | ICD-10-CM

## 2020-11-01 DIAGNOSIS — Z90710 Acquired absence of both cervix and uterus: Secondary | ICD-10-CM

## 2020-11-01 DIAGNOSIS — S5292XA Unspecified fracture of left forearm, initial encounter for closed fracture: Secondary | ICD-10-CM | POA: Diagnosis present

## 2020-11-01 DIAGNOSIS — S52609A Unspecified fracture of lower end of unspecified ulna, initial encounter for closed fracture: Secondary | ICD-10-CM

## 2020-11-01 DIAGNOSIS — I10 Essential (primary) hypertension: Secondary | ICD-10-CM | POA: Diagnosis present

## 2020-11-01 DIAGNOSIS — S82001A Unspecified fracture of right patella, initial encounter for closed fracture: Secondary | ICD-10-CM | POA: Diagnosis not present

## 2020-11-01 DIAGNOSIS — D649 Anemia, unspecified: Secondary | ICD-10-CM | POA: Diagnosis present

## 2020-11-01 DIAGNOSIS — Z87891 Personal history of nicotine dependence: Secondary | ICD-10-CM

## 2020-11-01 DIAGNOSIS — Z885 Allergy status to narcotic agent status: Secondary | ICD-10-CM

## 2020-11-01 DIAGNOSIS — Z800 Family history of malignant neoplasm of digestive organs: Secondary | ICD-10-CM

## 2020-11-01 DIAGNOSIS — R110 Nausea: Secondary | ICD-10-CM | POA: Diagnosis not present

## 2020-11-01 DIAGNOSIS — Z833 Family history of diabetes mellitus: Secondary | ICD-10-CM

## 2020-11-01 DIAGNOSIS — S0181XA Laceration without foreign body of other part of head, initial encounter: Secondary | ICD-10-CM | POA: Diagnosis present

## 2020-11-01 DIAGNOSIS — Z87442 Personal history of urinary calculi: Secondary | ICD-10-CM

## 2020-11-01 LAB — CBC WITH DIFFERENTIAL/PLATELET
Abs Immature Granulocytes: 0.26 10*3/uL — ABNORMAL HIGH (ref 0.00–0.07)
Basophils Absolute: 0.1 10*3/uL (ref 0.0–0.1)
Basophils Relative: 2 %
Eosinophils Absolute: 0.1 10*3/uL (ref 0.0–0.5)
Eosinophils Relative: 1 %
HCT: 30.1 % — ABNORMAL LOW (ref 36.0–46.0)
Hemoglobin: 10.4 g/dL — ABNORMAL LOW (ref 12.0–15.0)
Immature Granulocytes: 4 %
Lymphocytes Relative: 28 %
Lymphs Abs: 2.1 10*3/uL (ref 0.7–4.0)
MCH: 33.2 pg (ref 26.0–34.0)
MCHC: 34.6 g/dL (ref 30.0–36.0)
MCV: 96.2 fL (ref 80.0–100.0)
Monocytes Absolute: 0.6 10*3/uL (ref 0.1–1.0)
Monocytes Relative: 8 %
Neutro Abs: 4.3 10*3/uL (ref 1.7–7.7)
Neutrophils Relative %: 57 %
Platelets: 148 10*3/uL — ABNORMAL LOW (ref 150–400)
RBC: 3.13 MIL/uL — ABNORMAL LOW (ref 3.87–5.11)
RDW: 13.2 % (ref 11.5–15.5)
WBC: 7.4 10*3/uL (ref 4.0–10.5)
nRBC: 0 % (ref 0.0–0.2)

## 2020-11-01 LAB — RESP PANEL BY RT-PCR (FLU A&B, COVID) ARPGX2
Influenza A by PCR: NEGATIVE
Influenza B by PCR: NEGATIVE
SARS Coronavirus 2 by RT PCR: NEGATIVE

## 2020-11-01 LAB — COMPREHENSIVE METABOLIC PANEL
ALT: 16 U/L (ref 0–44)
AST: 29 U/L (ref 15–41)
Albumin: 4.5 g/dL (ref 3.5–5.0)
Alkaline Phosphatase: 54 U/L (ref 38–126)
Anion gap: 10 (ref 5–15)
BUN: 20 mg/dL (ref 8–23)
CO2: 29 mmol/L (ref 22–32)
Calcium: 10.2 mg/dL (ref 8.9–10.3)
Chloride: 102 mmol/L (ref 98–111)
Creatinine, Ser: 0.8 mg/dL (ref 0.44–1.00)
GFR, Estimated: >60 mL/min (ref >60)
Glucose, Bld: 101 mg/dL — ABNORMAL HIGH (ref 70–99)
Potassium: 3.9 mmol/L (ref 3.5–5.1)
Sodium: 141 mmol/L (ref 135–145)
Total Bilirubin: 0.5 mg/dL (ref 0.3–1.2)
Total Protein: 7.9 g/dL (ref 6.5–8.1)

## 2020-11-01 LAB — CULTURE, URINE COMPREHENSIVE

## 2020-11-01 MED ORDER — METHOCARBAMOL 1000 MG/10ML IJ SOLN
500.0000 mg | Freq: Four times a day (QID) | INTRAVENOUS | Status: DC | PRN
  Administered 2020-11-03: 500 mg via INTRAVENOUS
  Filled 2020-11-01: qty 500
  Filled 2020-11-01: qty 5

## 2020-11-01 MED ORDER — ONDANSETRON HCL 4 MG PO TABS
4.0000 mg | ORAL_TABLET | Freq: Four times a day (QID) | ORAL | Status: DC | PRN
  Administered 2020-11-01 – 2020-11-07 (×3): 4 mg via ORAL
  Filled 2020-11-01 (×3): qty 1

## 2020-11-01 MED ORDER — AMLODIPINE BESYLATE 5 MG PO TABS
5.0000 mg | ORAL_TABLET | Freq: Every day | ORAL | Status: DC
  Administered 2020-11-01 – 2020-11-02 (×2): 5 mg via ORAL
  Filled 2020-11-01 (×2): qty 1

## 2020-11-01 MED ORDER — OXYCODONE HCL 5 MG PO TABS
10.0000 mg | ORAL_TABLET | Freq: Three times a day (TID) | ORAL | Status: DC
Start: 2020-11-01 — End: 2020-11-02
  Administered 2020-11-01 – 2020-11-02 (×4): 10 mg via ORAL
  Filled 2020-11-01 (×4): qty 2

## 2020-11-01 MED ORDER — FENOFIBRIC ACID 135 MG PO CPDR: 135.0000 mg | DELAYED_RELEASE_CAPSULE | Freq: Every day | ORAL | Status: DC

## 2020-11-01 MED ORDER — DONEPEZIL HCL 5 MG PO TABS
5.0000 mg | ORAL_TABLET | Freq: Every day | ORAL | Status: DC
  Administered 2020-11-01 – 2020-11-08 (×8): 5 mg via ORAL
  Filled 2020-11-01 (×8): qty 1

## 2020-11-01 MED ORDER — ADULT MULTIVITAMIN W/MINERALS CH
1.0000 | ORAL_TABLET | Freq: Every day | ORAL | Status: DC
  Administered 2020-11-01 – 2020-11-08 (×8): 1 via ORAL
  Filled 2020-11-01 (×8): qty 1

## 2020-11-01 MED ORDER — SENNA 8.6 MG PO TABS
1.0000 | ORAL_TABLET | Freq: Every day | ORAL | Status: DC
  Administered 2020-11-02 – 2020-11-05 (×4): 8.6 mg via ORAL
  Filled 2020-11-01 (×4): qty 1

## 2020-11-01 MED ORDER — VITAMIN D3 25 MCG (1000 UNIT) PO TABS
5000.0000 [IU] | ORAL_TABLET | Freq: Every day | ORAL | Status: DC
  Administered 2020-11-01 – 2020-11-08 (×8): 5000 [IU] via ORAL
  Filled 2020-11-01 (×14): qty 5

## 2020-11-01 MED ORDER — ONDANSETRON HCL 4 MG/2ML IJ SOLN
4.0000 mg | Freq: Four times a day (QID) | INTRAMUSCULAR | Status: DC | PRN
  Administered 2020-11-01 – 2020-11-07 (×8): 4 mg via INTRAVENOUS
  Filled 2020-11-01 (×8): qty 2

## 2020-11-01 MED ORDER — LISINOPRIL 20 MG PO TABS
20.0000 mg | ORAL_TABLET | Freq: Every day | ORAL | Status: DC
  Administered 2020-11-01 – 2020-11-02 (×2): 20 mg via ORAL
  Filled 2020-11-01: qty 1
  Filled 2020-11-01: qty 2

## 2020-11-01 MED ORDER — ENOXAPARIN SODIUM 40 MG/0.4ML IJ SOSY
40.0000 mg | PREFILLED_SYRINGE | INTRAMUSCULAR | Status: DC
  Administered 2020-11-01 – 2020-11-08 (×8): 40 mg via SUBCUTANEOUS
  Filled 2020-11-01 (×8): qty 0.4

## 2020-11-01 MED ORDER — PANTOPRAZOLE SODIUM 40 MG PO TBEC
40.0000 mg | DELAYED_RELEASE_TABLET | Freq: Every day | ORAL | Status: DC
  Administered 2020-11-01 – 2020-11-08 (×8): 40 mg via ORAL
  Filled 2020-11-01 (×7): qty 1

## 2020-11-01 MED ORDER — TETANUS-DIPHTHERIA TOXOIDS TD 5-2 LFU IM INJ
0.5000 mL | INJECTION | Freq: Once | INTRAMUSCULAR | Status: AC
  Administered 2020-11-01: 0.5 mL via INTRAMUSCULAR
  Filled 2020-11-01: qty 0.5

## 2020-11-01 MED ORDER — HYDROCODONE-ACETAMINOPHEN 5-325 MG PO TABS
1.0000 | ORAL_TABLET | Freq: Once | ORAL | Status: AC
  Administered 2020-11-01: 1 via ORAL
  Filled 2020-11-01: qty 1

## 2020-11-01 MED ORDER — ACETAMINOPHEN 500 MG PO TABS
1000.0000 mg | ORAL_TABLET | Freq: Once | ORAL | Status: AC
  Administered 2020-11-01: 1000 mg via ORAL
  Filled 2020-11-01: qty 2

## 2020-11-01 NOTE — ED Notes (Signed)
Dr. Kumar at bedside

## 2020-11-01 NOTE — ED Triage Notes (Addendum)
EMS brings pt in from home; to triage via w/c with no distress noted; reports pt had mechanical fall on carport PTA; denies LOC but reports HA, denies neck pain; c/o pain to rt knee, abrasions to toes bilat; pain to left wrist (splint & sling in place), lac above left eye; swelling/tenderness to left cheek

## 2020-11-01 NOTE — ED Provider Notes (Addendum)
[facility]  ____________________________________________   Event Date/Time   First MD Initiated Contact with Patient 11/01/20 [phone]     (approximate)  I have reviewed the triage vital signs and the nursing notes.   HISTORY  Chief Complaint Fall    HPI Alison Price is a 75 y.o. female with past medical history of hypertension GERD, arthritis who presents after a fall.  Patient was walking outside when she tripped on the concrete falling directly onto the right knee.  He was not sure if she hit her head or not.  She denies loss of consciousness.  She did catch her fall with the left arm.  She endorses pain in the left wrist as well as pain in the right knee.  She denies headache, neck pain or back pain.  She denies other extremity pain.         Past Medical History:  Diagnosis Date   Anemia    Arthritis    Chronic pain    Complication of anesthesia    Dyspnea    GERD (gastroesophageal reflux disease)    History of kidney stones    Hypertension    Osteoporosis    Pneumonia    PONV (postoperative nausea and vomiting)    Stevens-Johnson syndrome (HCC)     Patient Active Problem List   Diagnosis Date Noted   Sepsis (HCC)    Acute pyelonephritis 04/13/2020   Obstructive uropathy 04/13/2020   Normocytic anemia 04/13/2020   Depression    Chronic pain    Protein-calorie malnutrition, severe 07/01/2019   Other chronic pain    Urinary tract infection due to extended-spectrum beta lactamase (ESBL) producing Escherichia coli    Weakness    Constipation    Abdominal pain 06/29/2019   Hypomagnesemia    Gastroesophageal reflux disease without esophagitis    Hip fx (HCC) 08/05/2017   Nausea and vomiting 10/23/2016   Hypokalemia 10/23/2016   Dehydration 10/23/2016   Essential hypertension 10/23/2016   History of total abdominal hysterectomy 06/26/2015   Pelvic pressure in female 06/26/2015   Vaginal atrophy 06/26/2015   Hematuria  06/26/2015   Dysuria 06/26/2015   Pancytopenia (HCC) 04/13/2015    Past Surgical History:  Procedure Laterality Date   ABDOMINAL HYSTERECTOMY     partial   BACK SURGERY     CYSTOSCOPY WITH STENT PLACEMENT Right 04/13/2020   Procedure: CYSTOSCOPY WITH STENT PLACEMENT;  Surgeon: Sondra Come, MD;  Location: [facility];  Service: Urology;  Laterality: Right;   CYSTOSCOPY/URETEROSCOPY/HOLMIUM LASER/STENT PLACEMENT Right 04/27/2020   Procedure: CYSTOSCOPY/URETEROSCOPY/HOLMIUM LASER/STENT EXCHANGE;  Surgeon: Sondra Come, MD;  Location: [facility];  Service: Urology;  Laterality: Right;   ESOPHAGOGASTRODUODENOSCOPY N/A 06/30/2019   Procedure: ESOPHAGOGASTRODUODENOSCOPY (EGD);  Surgeon: Toledo, Boykin Nearing, MD;  Location: [facility];  Service: Gastroenterology;  Laterality: N/A;   FOOT SURGERY     FRACTURE SURGERY     left hip repair   HAND SURGERY     THUMB REPAIR   HEMORRHOID SURGERY     INTRAMEDULLARY (IM) NAIL INTERTROCHANTERIC Left 08/06/2017   Procedure: INTRAMEDULLARY (IM) NAIL INTERTROCHANTRIC;  Surgeon: Signa Kell, MD;  Location: [facility];  Service: Orthopedics;  Laterality: Left;   POSTERIOR REPAIR      Prior to Admission medications   Medication Sig Start Date End Date Taking? Authorizing Provider  amLODipine (NORVASC) 5 MG tablet Take 5 mg by mouth daily. 03/08/18   [provider]  cefUROXime (CEFTIN) 500 MG tablet Take 1 tablet (  500 mg total) by mouth 2 (two) times daily with a meal. 10/23/20   McGowan, Carollee Herter A, PA-C  Cholecalciferol (VITAMIN D) 2000 units CAPS Take 6,000 Units by mouth daily.     [provider]  Choline Fenofibrate (FENOFIBRIC ACID) 135 MG CPDR Take 135 mg by mouth daily.     [provider]  dimenhyDRINATE (DRAMAMINE) 50 MG tablet Take 50 mg by mouth every 8 (eight) hours as needed for nausea.     [provider]  donepezil (ARICEPT) 5 MG tablet Take 5 mg by mouth daily. 10/08/20   [provider]   fexofenadine (ALLEGRA) 180 MG tablet Take 180 mg by mouth daily.    [provider]  lisinopril (ZESTRIL) 20 MG tablet Take 20 mg by mouth daily. 03/08/18   [provider]  Multiple Vitamin (MULTIVITAMIN) tablet Take 1 tablet by mouth daily.    [provider]  nystatin (MYCOSTATIN) 100000 UNIT/ML suspension Take 5 mLs (500,000 Units total) by mouth 4 (four) times daily. 07/02/19   Alford Highland, MD  Oxycodone HCl 10 MG TABS Take 10 mg by mouth 3 (three) times daily. 06/10/19   [provider]  pantoprazole (PROTONIX) 40 MG tablet Take 1 tablet (40 mg total) by mouth daily. 07/02/19 07/01/20  Alford Highland, MD    Allergies Codeine and Sulfa antibiotics  Family History  Problem Relation Age of Onset   Diabetes Paternal Grandmother    Uterine cancer Maternal Aunt    Stomach cancer Maternal Aunt    Alcohol abuse Father    Breast cancer Neg Hx    Ovarian cancer Neg Hx    Colon cancer Neg Hx    Heart disease Neg Hx     Social History Social History   Tobacco Use   Smoking status: Former    Types: Cigarettes    Quit date: 02/27/1986    Years since quitting: 34.7   Smokeless tobacco: Never  Vaping Use   Vaping Use: Never used  Substance Use Topics   Alcohol use: No   Drug use: No    Review of Systems   Review of Systems  HENT:  Positive for facial swelling.   Respiratory:  Negative for shortness of breath.   Cardiovascular:  Negative for chest pain.  Gastrointestinal:  Negative for abdominal pain.  Musculoskeletal:  Positive for arthralgias and myalgias. Negative for back pain and neck pain.  Skin:  Positive for wound.  Neurological:  Negative for syncope and light-headedness.  All other systems reviewed and are negative.  Physical Exam Updated Vital Signs BP (!) 159/119 (BP Location: Right Arm)   Pulse 72   Temp 98.2 F (36.8 C) (Oral)   Resp 18   Ht 5\' 10"  (1.778 m)   Wt 68 kg   SpO2 100%   BMI 21.52 kg/m   Physical  Exam Vitals and nursing note reviewed.  Constitutional:      General: She is not in acute distress.    Appearance: Normal appearance.  HENT:     Head: Normocephalic.     Comments: 1 cm laceration over the left eyebrow, soft tissue swelling of the left zygoma    Nose: Nose normal.  Eyes:     General: No scleral icterus.    Extraocular Movements: Extraocular movements intact.     Conjunctiva/sclera: Conjunctivae normal.     Pupils: Pupils are equal, round, and reactive to light.  Pulmonary:     Effort: Pulmonary effort is normal. No respiratory  distress.     Breath sounds: No stridor.  Abdominal:     General: Abdomen is flat. There is no distension.     Tenderness: There is no abdominal tenderness.  Musculoskeletal:        General: Swelling and tenderness present. No deformity or signs of injury.     Cervical back: Normal range of motion. No rigidity.     Comments: No C, T or L-spine tenderness Chest wall is nontender, no crepitus Pelvis is nontender, stable  Left upper extremity with ecchymosis around the wrist, no obvious deformity, tenderness to palpation, 2+ radial pulse bilaterally, able to move fingers Right knee is significantly swollen with effusion, tender to palpation, unable to straight leg raise Foot lower extremity with abrasion over the knee, but no tenderness or deformity 2+ DP pulses bilaterally Sensation grossly intact in the bilateral lower extremities  Skin:    General: Skin is dry.     Coloration: Skin is not jaundiced or pale.  Neurological:     General: No focal deficit present.     Mental Status: She is alert and oriented to person, place, and time. Mental status is at baseline.  Psychiatric:        Mood and Affect: Mood normal.        Behavior: Behavior normal.     LABS (all labs ordered are listed, but only abnormal results are displayed)  Labs Reviewed  COMPREHENSIVE METABOLIC PANEL - Abnormal; Notable for the following components:      Result  Value   Glucose, Bld 101 (*)    All other components within normal limits  CBC WITH DIFFERENTIAL/PLATELET - Abnormal; Notable for the following components:   RBC 3.13 (*)    Hemoglobin 10.4 (*)    HCT 30.1 (*)    Platelets 148 (*)    Abs Immature Granulocytes 0.26 (*)    All other components within normal limits  RESP PANEL BY RT-PCR (FLU A&B, COVID) [id number]   ____________________________________________  EKG  N/a ____________________________________________  RADIOLOGY Ky Barban, personally viewed and evaluated these images (plain radiographs) as part of my medical decision making, as well as reviewing the written report by the radiologist.  ED MD interpretation: I reviewed the CT scan of the head and C-spine which are negative for acute fracture dislocation I reviewed the CT of the max face which is negative for acute injury I reviewed the x-ray of the left wrist which shows a nondisplaced distal radius and ulna fracture I reviewed the x-ray of the right knee which shows a nondisplaced patella fracture    ____________________________________________   PROCEDURES  Procedure(s) performed (including Critical Care):  Marland KitchenMarland KitchenLaceration Repair  Date/Time: 11/01/2020 8:13 AM Performed by: Georga Hacking, MD Authorized by: Georga Hacking, MD   Consent:    Consent obtained:  Verbal   Consent given by:  Patient   Risks discussed:  Pain Universal protocol:    Patient identity confirmed:  Verbally with patient Laceration details:    Location:  Face   Face location:  Forehead   Length (cm):  2 Pre-procedure details:    Preparation:  Imaging obtained to evaluate for foreign bodies Exploration:    Limited defect created (wound extended): no     Hemostasis achieved with:  Direct pressure   Imaging outcome: foreign body not noted     Wound extent: no areolar tissue violation noted, no fascia violation noted, no foreign bodies/material noted, no muscle damage  noted, no nerve damage  noted, no tendon damage noted, no underlying fracture noted and no vascular damage noted     Contaminated: no   Treatment:    Area cleansed with:  Saline   Amount of cleaning:  Standard   Irrigation solution:  Sterile saline   Irrigation method:  Syringe   Visualized foreign bodies/material removed: no     Debridement:  None   Scar revision: no   Skin repair:    Repair method:  Sutures   Suture size:  6-0   Suture material:  Prolene   Suture technique:  Simple interrupted   Number of sutures:  2 Approximation:    Approximation:  Close Repair type:    Repair type:  Simple Post-procedure details:    Dressing:  Open (no dressing)   Procedure completion:  Tolerated well, no immediate complications   ____________________________________________   INITIAL IMPRESSION / ASSESSMENT AND PLAN / ED COURSE     75 year old female presents after mechanical fall.  She has a laceration to her left forehead as well as ecchymosis on her left cheek, swelling and tenderness of her right knee and significant ecchymosis and tenderness of the left wrist.  CT head, C-spine and max face were obtained which are negative for acute traumatic injury.  She has a nondisplaced distal radius and ulna fracture on the left and a nondisplaced patella fracture on the right.  On exam her right knee is significantly swollen she has difficulty with straight leg raise.  There is no obvious tibial plateau fracture on the x-ray but I am concerned for more significant underlying injury.  We will get a CT without contrast.  Is otherwise well-appearing but I am concerned about her at her age getting around with a knee immobilizer and a splint on her dominant arm.  She would likely benefit from inpatient admission for physical therapy, and rehab placement.  Will admit to the hospital to facilitate physical therapy and disposition.      ____________________________________________   FINAL CLINICAL  IMPRESSION(S) / ED DIAGNOSES  Final diagnoses:  Closed fracture of distal end of left radius, unspecified fracture morphology, initial encounter  Closed fracture of distal end of ulna, unspecified fracture morphology, unspecified laterality, initial encounter  Closed nondisplaced fracture of right patella, unspecified fracture morphology, initial encounter     ED Discharge Orders     None        Note:  This document was prepared using Dragon voice recognition software and may include unintentional dictation errors.    Georga Hacking, MD 11/01/20 [id number]    Georga Hacking, MD 11/01/20 [phone]

## 2020-11-01 NOTE — ED Notes (Signed)
Pt to CT

## 2020-11-01 NOTE — Consult Note (Addendum)
Full consult note to follow. Recommend NWB left hand, but may use platform walker and weight bear as tolerated through the left elbow. May WBAT on the right lower extremity while in knee immobilizer only. She follows commands but unable to give a history.   ORTHOPAEDIC CONSULTATION  REQUESTING PHYSICIAN: Wouk, Wilfred Curtis, MD  Chief Complaint: wrist and knee pain  HPI: Alison Price is a 75 y.o. female who complains of wrist and knee pain. The pain is sharp in character. The pain is severe and 8/10. The pain is worse with movement and better with rest. Denies any numbness, tingling or constitutional symptoms.  Past Medical History:  Diagnosis Date   Anemia    Arthritis    Chronic pain    Complication of anesthesia    Dyspnea    GERD (gastroesophageal reflux disease)    History of kidney stones    Hypertension    Osteoporosis    Pneumonia    PONV (postoperative nausea and vomiting)    Stevens-Johnson syndrome (HCC)    Past Surgical History:  Procedure Laterality Date   ABDOMINAL HYSTERECTOMY     partial   BACK SURGERY     CYSTOSCOPY WITH STENT PLACEMENT Right 04/13/2020   Procedure: CYSTOSCOPY WITH STENT PLACEMENT;  Surgeon: Sondra Come, MD;  Location: [facility];  Service: Urology;  Laterality: Right;   CYSTOSCOPY/URETEROSCOPY/HOLMIUM LASER/STENT PLACEMENT Right 04/27/2020   Procedure: CYSTOSCOPY/URETEROSCOPY/HOLMIUM LASER/STENT EXCHANGE;  Surgeon: Sondra Come, MD;  Location: [facility];  Service: Urology;  Laterality: Right;   ESOPHAGOGASTRODUODENOSCOPY N/A 06/30/2019   Procedure: ESOPHAGOGASTRODUODENOSCOPY (EGD);  Surgeon: Toledo, Boykin Nearing, MD;  Location: [facility];  Service: Gastroenterology;  Laterality: N/A;   FOOT SURGERY     FRACTURE SURGERY     left hip repair   HAND SURGERY     THUMB REPAIR   HEMORRHOID SURGERY     INTRAMEDULLARY (IM) NAIL INTERTROCHANTERIC Left 08/06/2017   Procedure: INTRAMEDULLARY (IM) NAIL INTERTROCHANTRIC;  Surgeon: Signa Kell, MD;  Location: [facility];  Service: Orthopedics;  Laterality: Left;   POSTERIOR REPAIR     Social History   Socioeconomic History   Marital status: Divorced    Spouse name: Not on file   Number of children: Not on file   Years of education: Not on file   Highest education level: Not on file  Occupational History   Not on file  Tobacco Use   Smoking status: Former    Types: Cigarettes    Quit date: 02/27/1986    Years since quitting: 34.7   Smokeless tobacco: Never  Vaping Use   Vaping Use: Never used  Substance and Sexual Activity   Alcohol use: No   Drug use: No   Sexual activity: Not Currently    Birth control/protection: Surgical, None  Other Topics Concern   Not on file  Social History Narrative   Not on file   Social Determinants of Health   Financial Resource Strain: Not on file  Food Insecurity: Not on file  Transportation Needs: Not on file  Physical Activity: Not on file  Stress: Not on file  Social Connections: Not on file   Family History  Problem Relation Age of Onset   Diabetes Paternal Grandmother    Uterine cancer Maternal Aunt    Stomach cancer Maternal Aunt    Alcohol abuse Father    Breast cancer Neg Hx    Ovarian cancer Neg Hx    Colon cancer Neg Hx    Heart  disease Neg Hx    Allergies  Allergen Reactions   Codeine Nausea And Vomiting   Sulfa Antibiotics Other (See Comments)    STEVEN JOHNSON SYNDROME   Prior to Admission medications   Medication Sig Start Date End Date Taking? Authorizing Provider  amLODipine (NORVASC) 5 MG tablet Take 5 mg by mouth daily. 03/08/18  Yes [provider]  cefUROXime (CEFTIN) 500 MG tablet Take 1 tablet (500 mg total) by mouth 2 (two) times daily with a meal. 10/23/20  Yes McGowan, Carollee Herter A, PA-C  Cholecalciferol (VITAMIN D) 2000 units CAPS Take 5,000 Units by mouth daily.   Yes [provider]  dimenhyDRINATE (DRAMAMINE) 50 MG tablet Take 50 mg by mouth every 8 (eight) hours as  needed for nausea.    Yes [provider]  donepezil (ARICEPT) 5 MG tablet Take 10 mg by mouth daily. 10/08/20  Yes [provider]  fexofenadine (ALLEGRA) 180 MG tablet Take 180 mg by mouth daily.   Yes [provider]  lisinopril (ZESTRIL) 20 MG tablet Take 20 mg by mouth daily. 03/08/18  Yes [provider]  Multiple Vitamin (MULTIVITAMIN) tablet Take 1 tablet by mouth daily.   Yes [provider]  Oxycodone HCl 10 MG TABS Take 10 mg by mouth 3 (three) times daily. 06/10/19  Yes [provider]  Choline Fenofibrate (FENOFIBRIC ACID) 135 MG CPDR Take 135 mg by mouth daily.  Patient not taking: No sig reported    [provider]  nystatin (MYCOSTATIN) 100000 UNIT/ML suspension Take 5 mLs (500,000 Units total) by mouth 4 (four) times daily. Patient not taking: No sig reported 07/02/19   Alford Highland, MD  pantoprazole (PROTONIX) 40 MG tablet Take 1 tablet (40 mg total) by mouth daily. 07/02/19 07/01/20  Alford Highland, MD   DG Elbow 2 Views Left  Result Date: 11/01/2020 CLINICAL DATA:  Mechanical fall with left elbow pain. EXAM: LEFT ELBOW - 2 VIEW COMPARISON:  None. FINDINGS: Suspicious lucency through the radial head on the frontal view, but no joint effusion. No malalignment. Generalized osteopenia. IMPRESSION: Lucency at the radial head could reflect a nondepressed fracture but there is no detectable joint effusion. Could immobilize and obtain follow-up depending on level of clinical suspicion. Electronically Signed   By: Tiburcio Pea M.D.   On: 11/01/2020 06:34   DG Wrist Complete Left  Result Date: 11/01/2020 CLINICAL DATA:  Status post fall. EXAM: LEFT WRIST - COMPLETE 3+ VIEW COMPARISON:  None. FINDINGS: An acute, comminuted, mildly displaced fracture deformity is seen involving the distal left radius. Extension to involve the radiocarpal articulation is noted. An additional acute fracture is seen involving the distal left  ulna. There is no evidence of dislocation. Marked severity degenerative changes are seen throughout the left wrist. There is mild diffuse soft tissue swelling. IMPRESSION: Acute fractures of the distal left radius and distal left ulna. Electronically Signed   By: Aram Candela M.D.   On: 11/01/2020 01:03   CT HEAD WO CONTRAST ( )  Result Date: 11/01/2020 CLINICAL DATA:  Head trauma EXAM: CT HEAD WITHOUT CONTRAST CT MAXILLOFACIAL WITHOUT CONTRAST CT CERVICAL SPINE WITHOUT CONTRAST TECHNIQUE: Multidetector CT imaging of the head, cervical spine, and maxillofacial structures were performed using the standard protocol without intravenous contrast. Multiplanar CT image reconstructions of the cervical spine and maxillofacial structures were also generated. COMPARISON:  None. FINDINGS: CT HEAD FINDINGS Brain: There is no mass, hemorrhage or extra-axial collection. There is generalized atrophy without lobar predilection. There is hypoattenuation  of the periventricular white matter, most commonly indicating chronic ischemic microangiopathy. Vascular: No abnormal hyperdensity of the major intracranial arteries or dural venous sinuses. No intracranial atherosclerosis. Skull: The visualized skull base, calvarium and extracranial soft tissues are normal. CT MAXILLOFACIAL FINDINGS Osseous: --Complex facial fracture types: No LeFort, zygomaticomaxillary complex or nasoorbitoethmoidal fracture. --Simple fracture types: None. --Mandible: No fracture or dislocation. Orbits: The globes are intact. Normal appearance of the intra- and extraconal fat. Symmetric extraocular muscles and optic nerves. Sinuses: No fluid levels or advanced mucosal thickening. Soft tissues: Normal visualized extracranial soft tissues. CT CERVICAL SPINE FINDINGS Alignment: No static subluxation. Facets are aligned. Occipital condyles and the lateral masses of C1-C2 are aligned. Skull base and vertebrae: No acute fracture. Chronic compression  deformities at C6 and C7. Soft tissues and spinal canal: No prevertebral fluid or swelling. No visible canal hematoma. Disc levels: Severe diffuse facet arthrosis. This is worse on the left. Upper chest: No pneumothorax, pulmonary nodule or pleural effusion. Other: Normal visualized paraspinal cervical soft tissues. IMPRESSION: 1. Chronic ischemic microangiopathy and generalized atrophy without acute intracranial abnormality. 2. No facial fracture. 3. No acute fracture or static subluxation of the cervical spine. 4. Chronic compression deformities at C6 and C7. Electronically Signed   By: Deatra Robinson M.D.   On: 11/01/2020 01:41   CT Cervical Spine Wo Contrast  Result Date: 11/01/2020 CLINICAL DATA:  Head trauma EXAM: CT HEAD WITHOUT CONTRAST CT MAXILLOFACIAL WITHOUT CONTRAST CT CERVICAL SPINE WITHOUT CONTRAST TECHNIQUE: Multidetector CT imaging of the head, cervical spine, and maxillofacial structures were performed using the standard protocol without intravenous contrast. Multiplanar CT image reconstructions of the cervical spine and maxillofacial structures were also generated. COMPARISON:  None. FINDINGS: CT HEAD FINDINGS Brain: There is no mass, hemorrhage or extra-axial collection. There is generalized atrophy without lobar predilection. There is hypoattenuation of the periventricular white matter, most commonly indicating chronic ischemic microangiopathy. Vascular: No abnormal hyperdensity of the major intracranial arteries or dural venous sinuses. No intracranial atherosclerosis. Skull: The visualized skull base, calvarium and extracranial soft tissues are normal. CT MAXILLOFACIAL FINDINGS Osseous: --Complex facial fracture types: No LeFort, zygomaticomaxillary complex or nasoorbitoethmoidal fracture. --Simple fracture types: None. --Mandible: No fracture or dislocation. Orbits: The globes are intact. Normal appearance of the intra- and extraconal fat. Symmetric extraocular muscles and optic nerves.  Sinuses: No fluid levels or advanced mucosal thickening. Soft tissues: Normal visualized extracranial soft tissues. CT CERVICAL SPINE FINDINGS Alignment: No static subluxation. Facets are aligned. Occipital condyles and the lateral masses of C1-C2 are aligned. Skull base and vertebrae: No acute fracture. Chronic compression deformities at C6 and C7. Soft tissues and spinal canal: No prevertebral fluid or swelling. No visible canal hematoma. Disc levels: Severe diffuse facet arthrosis. This is worse on the left. Upper chest: No pneumothorax, pulmonary nodule or pleural effusion. Other: Normal visualized paraspinal cervical soft tissues. IMPRESSION: 1. Chronic ischemic microangiopathy and generalized atrophy without acute intracranial abnormality. 2. No facial fracture. 3. No acute fracture or static subluxation of the cervical spine. 4. Chronic compression deformities at C6 and C7. Electronically Signed   By: Deatra Robinson M.D.   On: 11/01/2020 01:41   CT Knee Right Wo Contrast  Result Date: 11/01/2020 CLINICAL DATA:  Right knee pain after fall. EXAM: CT OF THE RIGHT KNEE WITHOUT CONTRAST TECHNIQUE: Multidetector CT imaging of the right knee was performed according to the standard protocol. Multiplanar CT image reconstructions were also generated. COMPARISON:  Right knee x-rays from same day. FINDINGS: Bones/Joint/Cartilage Acute nondisplaced  intra-articular fracture of the patella. Possible minimally depressed fracture of the lateral tibial plateau mesial aspect with cortical buckling (series 5, image 76). No dislocation. Mild medial and lateral compartment joint space narrowing with small marginal osteophytes. Large lipohemarthrosis. Osteopenia. Ligaments Ligaments are suboptimally evaluated by CT. Muscles and Tendons Grossly intact. Soft tissue No fluid collection or hematoma.  No soft tissue mass. IMPRESSION: 1. Acute nondisplaced intra-articular fracture of the patella. 2. Possible minimally depressed  fracture of the lateral tibial plateau mesial aspect. 3. Large lipohemarthrosis. Electronically Signed   By: Obie Dredge M.D.   On: 11/01/2020 08:18   DG Knee Complete 4 Views Right  Result Date: 11/01/2020 CLINICAL DATA:  Status post fall. EXAM: RIGHT KNEE - COMPLETE 4+ VIEW COMPARISON:  None. FINDINGS: Acute nondisplaced fracture of the right patella is seen. There is no evidence of dislocation. Patellofemoral compartment space narrowing is seen. Medial and lateral tibiofemoral compartment space narrowing is also noted. There is a small to moderate sized joint effusion. IMPRESSION: 1. Acute nondisplaced fracture of the right patella. 2. Small to moderate sized joint effusion. 3. Tricompartmental degenerative changes. Electronically Signed   By: Aram Candela M.D.   On: 11/01/2020 01:05   CT Maxillofacial Wo Contrast  Result Date: 11/01/2020 CLINICAL DATA:  Head trauma EXAM: CT HEAD WITHOUT CONTRAST CT MAXILLOFACIAL WITHOUT CONTRAST CT CERVICAL SPINE WITHOUT CONTRAST TECHNIQUE: Multidetector CT imaging of the head, cervical spine, and maxillofacial structures were performed using the standard protocol without intravenous contrast. Multiplanar CT image reconstructions of the cervical spine and maxillofacial structures were also generated. COMPARISON:  None. FINDINGS: CT HEAD FINDINGS Brain: There is no mass, hemorrhage or extra-axial collection. There is generalized atrophy without lobar predilection. There is hypoattenuation of the periventricular white matter, most commonly indicating chronic ischemic microangiopathy. Vascular: No abnormal hyperdensity of the major intracranial arteries or dural venous sinuses. No intracranial atherosclerosis. Skull: The visualized skull base, calvarium and extracranial soft tissues are normal. CT MAXILLOFACIAL FINDINGS Osseous: --Complex facial fracture types: No LeFort, zygomaticomaxillary complex or nasoorbitoethmoidal fracture. --Simple fracture types: None.  --Mandible: No fracture or dislocation. Orbits: The globes are intact. Normal appearance of the intra- and extraconal fat. Symmetric extraocular muscles and optic nerves. Sinuses: No fluid levels or advanced mucosal thickening. Soft tissues: Normal visualized extracranial soft tissues. CT CERVICAL SPINE FINDINGS Alignment: No static subluxation. Facets are aligned. Occipital condyles and the lateral masses of C1-C2 are aligned. Skull base and vertebrae: No acute fracture. Chronic compression deformities at C6 and C7. Soft tissues and spinal canal: No prevertebral fluid or swelling. No visible canal hematoma. Disc levels: Severe diffuse facet arthrosis. This is worse on the left. Upper chest: No pneumothorax, pulmonary nodule or pleural effusion. Other: Normal visualized paraspinal cervical soft tissues. IMPRESSION: 1. Chronic ischemic microangiopathy and generalized atrophy without acute intracranial abnormality. 2. No facial fracture. 3. No acute fracture or static subluxation of the cervical spine. 4. Chronic compression deformities at C6 and C7. Electronically Signed   By: Deatra Robinson M.D.   On: 11/01/2020 01:41    Positive ROS: All other systems have been reviewed and were otherwise negative with the exception of those mentioned in the HPI and as above.  Physical Exam: General: Alert, no acute distress Cardiovascular: No pedal edema Respiratory: No cyanosis, no use of accessory musculature GI: No organomegaly, abdomen is soft and non-tender Skin: No lesions in the area of chief complaint Neurologic: Sensation intact distally Psychiatric: Patient is competent for consent with normal mood and affect Lymphatic:  No axillary or cervical lymphadenopathy  MUSCULOSKELETAL: left volar splint c/d/i. Compartments soft. Good cap refill. Motor and sensory intact distally. Right knee immobilizer c/d/I, compartments soft, motor and sensory intact  Assessment: Right patella fracture Left wrist  fracture  Plan: Patient does not require operative fixation. She may WBAT in knee immobilizer. She is non-weight bearing with left hand, but may weight bear as tolerated through her elbow. PT and OT are consulted. She will be followed as an outpatient and will likely need SNF placement.    Lyndle Herrlich, MD    11/02/2020 8:54 AM

## 2020-11-01 NOTE — H&P (Signed)
History and Physical    Alison Price YQI:[id number] DOB: [birth year] DOA: 11/01/2020  PCP: Jaclyn Shaggy, MD   Patient coming from: Home  I have personally briefly reviewed patient's old medical records in [facility]  Chief Complaint: Pain in left hand/right knee following a fall  HPI: Alison Price is a 75 y.o. female with medical history significant for hypertension, GERD, arthritis who presents to the ER for evaluation following a fall.  Patient states that she had gone outside to use the bathroom and while walking back into the house she tripped on concrete and fell.  She denies any loss of consciousness and denied feeling dizzy or lightheaded prior to the fall.  She complained of pain in her left arm as well as her right knee and was unable to get up. EMS was called and patient was transported to the ER for further evaluation. She denies having any dizziness, no lightheadedness, no chest pain, no shortness of breath, no nausea, vomiting, no diaphoresis, no palpitations, no abdominal pain, no changes in her bowel habits, no urinary symptoms, no fever, no chills, no cough, no leg swelling, no orthopnea or paroxysmal nocturnal dyspnea Labs show sodium 141, potassium 3.9, chloride 102, bicarb 29, glucose 101, BUN 20, creatinine 0.80, calcium 10.2, alkaline phosphatase 54, albumin 4.5, AST 29, ALT 16, total protein 7.9,, white count 7.4, hemoglobin 10.4, hematocrit 30.1, MCV 96.2, RDW 13.2, platelet count 148 Respiratory viral panel is negative CT scan of the head without contrast/cervical spine CT shows chronic ischemic microangiopathy and generalized atrophy without acute intracranial abnormality.  No facial fracture.  No acute fracture or static subluxation of the cervical spine.  Chronic compression deformities at C6 and C7. CT scan of the right knee shows an acute nondisplaced intra-articular fracture of the patella.  Possible minimally depressed fracture of the lateral  tibial plateau and medial aspect.  Large lipohemarthrosis. Left elbow fracture shows a lucency at the radial head which could reflect a nondepressed fracture but there is no detectable joint effusion. Left wrist x-ray shows acute fractures of the distal left radius and distal left ulna.   ED Course: Patient is a 75 year old Caucasian female who presents to the ER via EMS following a mechanical fall at home.  She has an acute nondisplaced intra-articular fracture of the patella as well as a minimally depressed fracture of the lateral tibial plateau.  She also has an acute comminuted mildly displaced fracture deformity in the distal left radius. Patient is left-handed and is unable to bear weight on her right lower extremity due to pain. She had a laceration over her left eye which was sutured in the ER. Admission was requested for PT evaluation and possible rehab placement.  Review of Systems: As per HPI otherwise all other systems reviewed and negative.    Past Medical History:  Diagnosis Date   Anemia    Arthritis    Chronic pain    Complication of anesthesia    Dyspnea    GERD (gastroesophageal reflux disease)    History of kidney stones    Hypertension    Osteoporosis    Pneumonia    PONV (postoperative nausea and vomiting)    Stevens-Johnson syndrome (HCC)     Past Surgical History:  Procedure Laterality Date   ABDOMINAL HYSTERECTOMY     partial   BACK SURGERY     CYSTOSCOPY WITH STENT PLACEMENT Right 04/13/2020   Procedure: CYSTOSCOPY WITH STENT PLACEMENT;  Surgeon: Sondra Come, MD;  Location: [facility] ORS;  Service: Urology;  Laterality: Right;   CYSTOSCOPY/URETEROSCOPY/HOLMIUM LASER/STENT PLACEMENT Right 04/27/2020   Procedure: CYSTOSCOPY/URETEROSCOPY/HOLMIUM LASER/STENT EXCHANGE;  Surgeon: Sondra Come, MD;  Location: [facility] ORS;  Service: Urology;  Laterality: Right;   ESOPHAGOGASTRODUODENOSCOPY N/A 06/30/2019   Procedure: ESOPHAGOGASTRODUODENOSCOPY (EGD);  Surgeon:  Toledo, Boykin Nearing, MD;  Location: [facility] ENDOSCOPY;  Service: Gastroenterology;  Laterality: N/A;   FOOT SURGERY     FRACTURE SURGERY     left hip repair   HAND SURGERY     THUMB REPAIR   HEMORRHOID SURGERY     INTRAMEDULLARY (IM) NAIL INTERTROCHANTERIC Left 08/06/2017   Procedure: INTRAMEDULLARY (IM) NAIL INTERTROCHANTRIC;  Surgeon: Signa Kell, MD;  Location: [facility] ORS;  Service: Orthopedics;  Laterality: Left;   POSTERIOR REPAIR       reports that she quit smoking about 34 years ago. Her smoking use included cigarettes. She has never used smokeless tobacco. She reports that she does not drink alcohol and does not use drugs.  Allergies  Allergen Reactions   Codeine Nausea And Vomiting   Sulfa Antibiotics Other (See Comments)    STEVEN JOHNSON SYNDROME    Family History  Problem Relation Age of Onset   Diabetes Paternal Grandmother    Uterine cancer Maternal Aunt    Stomach cancer Maternal Aunt    Alcohol abuse Father    Breast cancer Neg Hx    Ovarian cancer Neg Hx    Colon cancer Neg Hx    Heart disease Neg Hx       Prior to Admission medications   Medication Sig Start Date End Date Taking? Authorizing Provider  amLODipine (NORVASC) 5 MG tablet Take 5 mg by mouth daily. 03/08/18   [provider]  cefUROXime (CEFTIN) 500 MG tablet Take 1 tablet (500 mg total) by mouth 2 (two) times daily with a meal. 10/23/20   McGowan, Carollee Herter A, PA-C  Cholecalciferol (VITAMIN D) 2000 units CAPS Take 6,000 Units by mouth daily.     [provider]  Choline Fenofibrate (FENOFIBRIC ACID) 135 MG CPDR Take 135 mg by mouth daily.     [provider]  dimenhyDRINATE (DRAMAMINE) 50 MG tablet Take 50 mg by mouth every 8 (eight) hours as needed for nausea.     [provider]  donepezil (ARICEPT) 5 MG tablet Take 5 mg by mouth daily. 10/08/20   [provider]  fexofenadine (ALLEGRA) 180 MG tablet Take 180 mg by mouth daily.    [provider]   lisinopril (ZESTRIL) 20 MG tablet Take 20 mg by mouth daily. 03/08/18   [provider]  Multiple Vitamin (MULTIVITAMIN) tablet Take 1 tablet by mouth daily.    [provider]  nystatin (MYCOSTATIN) 100000 UNIT/ML suspension Take 5 mLs (500,000 Units total) by mouth 4 (four) times daily. Patient not taking: Reported on 11/01/2020 07/02/19   Alford Highland, MD  Oxycodone HCl 10 MG TABS Take 10 mg by mouth 3 (three) times daily. 06/10/19   [provider]  pantoprazole (PROTONIX) 40 MG tablet Take 1 tablet (40 mg total) by mouth daily. 07/02/19 07/01/20  Alford Highland, MD    Physical Exam: Vitals:   11/01/20 0019 11/01/20 0021 11/01/20 0021 11/01/20 0600  BP:   (!) 159/119 (!) 161/95  Pulse:  73 72 78  Resp:  18 18 18   Temp:  98.2 F (36.8 C) 98.2 F (36.8 C)   TempSrc:  Oral Oral   SpO2:  100% 100% 94%  Weight: 68  kg     Height: 5\' 10"  (1.778 m)        Vitals:   11/01/20 0019 11/01/20 0021 11/01/20 0021 11/01/20 0600  BP:   (!) 159/119 (!) 161/95  Pulse:  73 72 78  Resp:  18 18 18   Temp:  98.2 F (36.8 C) 98.2 F (36.8 C)   TempSrc:  Oral Oral   SpO2:  100% 100% 94%  Weight: 68 kg     Height: 5\' 10"  (1.778 m)         Constitutional: Alert and oriented x 3 . Not in any apparent distress HEENT:      Head: Normocephalic and atraumatic.         Eyes: PERLA, EOMI, Conjunctivae are normal. Sclera is non-icteric.       Mouth/Throat: Mucous membranes are moist.       Neck: Supple with no signs of meningismus. Cardiovascular: Regular rate and rhythm. No murmurs, gallops, or rubs. 2+ symmetrical distal pulses are present . No JVD. No LE edema Respiratory: Respiratory effort normal .Lungs sounds clear bilaterally. No wheezes, crackles, or rhonchi.  Gastrointestinal: Soft, non tender, and non distended with positive bowel sounds.  Genitourinary: No CVA tenderness. Musculoskeletal: Decreased range of motion in right knee and left forearm. No cyanosis,  or erythema of extremities. Neurologic:  Face is symmetric. Moving all extremities. No gross focal neurologic deficits . Skin: Skin is warm, dry.  No rash or ulcers.  Laceration over the left eyebrow Psychiatric: Mood and affect are normal    Labs on Admission: I have personally reviewed following labs and imaging studies  CBC: Recent Labs  Lab 11/01/20 0601  WBC 7.4  NEUTROABS 4.3  HGB 10.4*  HCT 30.1*  MCV 96.2  PLT 148*   Basic Metabolic Panel: Recent Labs  Lab 11/01/20 0601  NA 141  K 3.9  CL 102  CO2 29  GLUCOSE 101*  BUN 20  CREATININE 0.80  CALCIUM 10.2   GFR: Estimated Creatinine Clearance: 66.2 mL/min (by C-G formula based on SCr of 0.8 mg/dL). Liver Function Tests: Recent Labs  Lab 11/01/20 0601  AST 29  ALT 16  ALKPHOS 54  BILITOT 0.5  PROT 7.9  ALBUMIN 4.5   No results for input(s): LIPASE, AMYLASE in the last 168 hours. No results for input(s): AMMONIA in the last 168 hours. Coagulation Profile: No results for input(s): INR, PROTIME in the last 168 hours. Cardiac Enzymes: No results for input(s): CKTOTAL, CKMB, CKMBINDEX, TROPONINI in the last 168 hours. BNP (last 3 results) No results for input(s): PROBNP in the last 8760 hours. HbA1C: No results for input(s): HGBA1C in the last 72 hours. CBG: No results for input(s): GLUCAP in the last 168 hours. Lipid Profile: No results for input(s): CHOL, HDL, LDLCALC, TRIG, CHOLHDL, LDLDIRECT in the last 72 hours. Thyroid Function Tests: No results for input(s): TSH, T4TOTAL, FREET4, T3FREE, THYROIDAB in the last 72 hours. Anemia Panel: No results for input(s): VITAMINB12, FOLATE, FERRITIN, TIBC, IRON, RETICCTPCT in the last 72 hours. Urine analysis:    Component Value Date/Time   COLORURINE STRAW (A) 04/13/2020 1216   APPEARANCEUR Cloudy (A) 10/23/2020 1602   LABSPEC 1.020 04/13/2020 1216   LABSPEC 1.010 04/17/2011 2348   PHURINE 7.0 04/13/2020 1216   GLUCOSEU Negative 10/23/2020 1602    GLUCOSEU Negative 04/17/2011 2348   HGBUR NEGATIVE 04/13/2020 1216   BILIRUBINUR Negative 10/23/2020 1602   BILIRUBINUR Negative 04/17/2011 2348   KETONESUR NEGATIVE 04/13/2020 1216   PROTEINUR  1+ (A) 10/23/2020 1602   PROTEINUR NEGATIVE 04/13/2020 1216   UROBILINOGEN 0.2 06/26/2015 1204   NITRITE Negative 10/23/2020 1602   NITRITE POSITIVE (A) 04/13/2020 1216   LEUKOCYTESUR 2+ (A) 10/23/2020 1602   LEUKOCYTESUR TRACE (A) 04/13/2020 1216   LEUKOCYTESUR Negative 04/17/2011 2348    Radiological Exams on Admission: DG Elbow 2 Views Left  Result Date: 11/01/2020 CLINICAL DATA:  Mechanical fall with left elbow pain. EXAM: LEFT ELBOW - 2 VIEW COMPARISON:  None. FINDINGS: Suspicious lucency through the radial head on the frontal view, but no joint effusion. No malalignment. Generalized osteopenia. IMPRESSION: Lucency at the radial head could reflect a nondepressed fracture but there is no detectable joint effusion. Could immobilize and obtain follow-up depending on level of clinical suspicion. Electronically Signed   By: Tiburcio Pea M.D.   On: 11/01/2020 06:34   DG Wrist Complete Left  Result Date: 11/01/2020 CLINICAL DATA:  Status post fall. EXAM: LEFT WRIST - COMPLETE 3+ VIEW COMPARISON:  None. FINDINGS: An acute, comminuted, mildly displaced fracture deformity is seen involving the distal left radius. Extension to involve the radiocarpal articulation is noted. An additional acute fracture is seen involving the distal left ulna. There is no evidence of dislocation. Marked severity degenerative changes are seen throughout the left wrist. There is mild diffuse soft tissue swelling. IMPRESSION: Acute fractures of the distal left radius and distal left ulna. Electronically Signed   By: Aram Candela M.D.   On: 11/01/2020 01:03   CT HEAD WO CONTRAST ( )  Result Date: 11/01/2020 CLINICAL DATA:  Head trauma EXAM: CT HEAD WITHOUT CONTRAST CT MAXILLOFACIAL WITHOUT CONTRAST CT CERVICAL SPINE  WITHOUT CONTRAST TECHNIQUE: Multidetector CT imaging of the head, cervical spine, and maxillofacial structures were performed using the standard protocol without intravenous contrast. Multiplanar CT image reconstructions of the cervical spine and maxillofacial structures were also generated. COMPARISON:  None. FINDINGS: CT HEAD FINDINGS Brain: There is no mass, hemorrhage or extra-axial collection. There is generalized atrophy without lobar predilection. There is hypoattenuation of the periventricular white matter, most commonly indicating chronic ischemic microangiopathy. Vascular: No abnormal hyperdensity of the major intracranial arteries or dural venous sinuses. No intracranial atherosclerosis. Skull: The visualized skull base, calvarium and extracranial soft tissues are normal. CT MAXILLOFACIAL FINDINGS Osseous: --Complex facial fracture types: No LeFort, zygomaticomaxillary complex or nasoorbitoethmoidal fracture. --Simple fracture types: None. --Mandible: No fracture or dislocation. Orbits: The globes are intact. Normal appearance of the intra- and extraconal fat. Symmetric extraocular muscles and optic nerves. Sinuses: No fluid levels or advanced mucosal thickening. Soft tissues: Normal visualized extracranial soft tissues. CT CERVICAL SPINE FINDINGS Alignment: No static subluxation. Facets are aligned. Occipital condyles and the lateral masses of C1-C2 are aligned. Skull base and vertebrae: No acute fracture. Chronic compression deformities at C6 and C7. Soft tissues and spinal canal: No prevertebral fluid or swelling. No visible canal hematoma. Disc levels: Severe diffuse facet arthrosis. This is worse on the left. Upper chest: No pneumothorax, pulmonary nodule or pleural effusion. Other: Normal visualized paraspinal cervical soft tissues. IMPRESSION: 1. Chronic ischemic microangiopathy and generalized atrophy without acute intracranial abnormality. 2. No facial fracture. 3. No acute fracture or static  subluxation of the cervical spine. 4. Chronic compression deformities at C6 and C7. Electronically Signed   By: Deatra Robinson M.D.   On: 11/01/2020 01:41   CT Cervical Spine Wo Contrast  Result Date: 11/01/2020 CLINICAL DATA:  Head trauma EXAM: CT HEAD WITHOUT CONTRAST CT MAXILLOFACIAL WITHOUT CONTRAST CT CERVICAL SPINE WITHOUT CONTRAST  TECHNIQUE: Multidetector CT imaging of the head, cervical spine, and maxillofacial structures were performed using the standard protocol without intravenous contrast. Multiplanar CT image reconstructions of the cervical spine and maxillofacial structures were also generated. COMPARISON:  None. FINDINGS: CT HEAD FINDINGS Brain: There is no mass, hemorrhage or extra-axial collection. There is generalized atrophy without lobar predilection. There is hypoattenuation of the periventricular white matter, most commonly indicating chronic ischemic microangiopathy. Vascular: No abnormal hyperdensity of the major intracranial arteries or dural venous sinuses. No intracranial atherosclerosis. Skull: The visualized skull base, calvarium and extracranial soft tissues are normal. CT MAXILLOFACIAL FINDINGS Osseous: --Complex facial fracture types: No LeFort, zygomaticomaxillary complex or nasoorbitoethmoidal fracture. --Simple fracture types: None. --Mandible: No fracture or dislocation. Orbits: The globes are intact. Normal appearance of the intra- and extraconal fat. Symmetric extraocular muscles and optic nerves. Sinuses: No fluid levels or advanced mucosal thickening. Soft tissues: Normal visualized extracranial soft tissues. CT CERVICAL SPINE FINDINGS Alignment: No static subluxation. Facets are aligned. Occipital condyles and the lateral masses of C1-C2 are aligned. Skull base and vertebrae: No acute fracture. Chronic compression deformities at C6 and C7. Soft tissues and spinal canal: No prevertebral fluid or swelling. No visible canal hematoma. Disc levels: Severe diffuse facet  arthrosis. This is worse on the left. Upper chest: No pneumothorax, pulmonary nodule or pleural effusion. Other: Normal visualized paraspinal cervical soft tissues. IMPRESSION: 1. Chronic ischemic microangiopathy and generalized atrophy without acute intracranial abnormality. 2. No facial fracture. 3. No acute fracture or static subluxation of the cervical spine. 4. Chronic compression deformities at C6 and C7. Electronically Signed   By: Deatra Robinson M.D.   On: 11/01/2020 01:41   CT Knee Right Wo Contrast  Result Date: 11/01/2020 CLINICAL DATA:  Right knee pain after fall. EXAM: CT OF THE RIGHT KNEE WITHOUT CONTRAST TECHNIQUE: Multidetector CT imaging of the right knee was performed according to the standard protocol. Multiplanar CT image reconstructions were also generated. COMPARISON:  Right knee x-rays from same day. FINDINGS: Bones/Joint/Cartilage Acute nondisplaced intra-articular fracture of the patella. Possible minimally depressed fracture of the lateral tibial plateau mesial aspect with cortical buckling (series 5, image 76). No dislocation. Mild medial and lateral compartment joint space narrowing with small marginal osteophytes. Large lipohemarthrosis. Osteopenia. Ligaments Ligaments are suboptimally evaluated by CT. Muscles and Tendons Grossly intact. Soft tissue No fluid collection or hematoma.  No soft tissue mass. IMPRESSION: 1. Acute nondisplaced intra-articular fracture of the patella. 2. Possible minimally depressed fracture of the lateral tibial plateau mesial aspect. 3. Large lipohemarthrosis. Electronically Signed   By: Obie Dredge M.D.   On: 11/01/2020 08:18   DG Knee Complete 4 Views Right  Result Date: 11/01/2020 CLINICAL DATA:  Status post fall. EXAM: RIGHT KNEE - COMPLETE 4+ VIEW COMPARISON:  None. FINDINGS: Acute nondisplaced fracture of the right patella is seen. There is no evidence of dislocation. Patellofemoral compartment space narrowing is seen. Medial and lateral  tibiofemoral compartment space narrowing is also noted. There is a small to moderate sized joint effusion. IMPRESSION: 1. Acute nondisplaced fracture of the right patella. 2. Small to moderate sized joint effusion. 3. Tricompartmental degenerative changes. Electronically Signed   By: Aram Candela M.D.   On: 11/01/2020 01:05   CT Maxillofacial Wo Contrast  Result Date: 11/01/2020 CLINICAL DATA:  Head trauma EXAM: CT HEAD WITHOUT CONTRAST CT MAXILLOFACIAL WITHOUT CONTRAST CT CERVICAL SPINE WITHOUT CONTRAST TECHNIQUE: Multidetector CT imaging of the head, cervical spine, and maxillofacial structures were performed using the standard protocol without  intravenous contrast. Multiplanar CT image reconstructions of the cervical spine and maxillofacial structures were also generated. COMPARISON:  None. FINDINGS: CT HEAD FINDINGS Brain: There is no mass, hemorrhage or extra-axial collection. There is generalized atrophy without lobar predilection. There is hypoattenuation of the periventricular white matter, most commonly indicating chronic ischemic microangiopathy. Vascular: No abnormal hyperdensity of the major intracranial arteries or dural venous sinuses. No intracranial atherosclerosis. Skull: The visualized skull base, calvarium and extracranial soft tissues are normal. CT MAXILLOFACIAL FINDINGS Osseous: --Complex facial fracture types: No LeFort, zygomaticomaxillary complex or nasoorbitoethmoidal fracture. --Simple fracture types: None. --Mandible: No fracture or dislocation. Orbits: The globes are intact. Normal appearance of the intra- and extraconal fat. Symmetric extraocular muscles and optic nerves. Sinuses: No fluid levels or advanced mucosal thickening. Soft tissues: Normal visualized extracranial soft tissues. CT CERVICAL SPINE FINDINGS Alignment: No static subluxation. Facets are aligned. Occipital condyles and the lateral masses of C1-C2 are aligned. Skull base and vertebrae: No acute fracture.  Chronic compression deformities at C6 and C7. Soft tissues and spinal canal: No prevertebral fluid or swelling. No visible canal hematoma. Disc levels: Severe diffuse facet arthrosis. This is worse on the left. Upper chest: No pneumothorax, pulmonary nodule or pleural effusion. Other: Normal visualized paraspinal cervical soft tissues. IMPRESSION: 1. Chronic ischemic microangiopathy and generalized atrophy without acute intracranial abnormality. 2. No facial fracture. 3. No acute fracture or static subluxation of the cervical spine. 4. Chronic compression deformities at C6 and C7. Electronically Signed   By: Deatra Robinson M.D.   On: 11/01/2020 01:41     Assessment/Plan Principal Problem:   Right patella fracture Active Problems:   Essential hypertension   Gastroesophageal reflux disease without esophagitis   Left ulnar fracture   Left radial fracture   Fall     Status post fall With fracture involving the right patella/possible minimally depressed fracture of the lateral tibial plateau mesial aspect as well as distal fracture of the left radius and ulna. Orthopedic consult Immobilize left forearm and right lower extremity Pain control     Hypertension Continue amlodipine and lisinopril    GERD Continue PPI  DVT prophylaxis: Lovenox  Code Status: full code  Family Communication: Greater than 50% of time was spent discussing patient's condition and plan of care with her at the bedside.  All questions and concerns have been addressed. Disposition Plan: Back to previous home environment Consults called: Orthopedic surgery Status: Observation    Aasiyah Auerbach MD [facility]     11/01/2020, 8:44 AM

## 2020-11-01 NOTE — ED Notes (Signed)
Pt resting at this time. L arm splint being applied by paramedic Les. Pt denies any others needs at this time. Call bell in reach.

## 2020-11-02 ENCOUNTER — Encounter: Payer: Self-pay | Admitting: Internal Medicine

## 2020-11-02 DIAGNOSIS — S82001A Unspecified fracture of right patella, initial encounter for closed fracture: Secondary | ICD-10-CM | POA: Diagnosis not present

## 2020-11-02 LAB — BASIC METABOLIC PANEL
Anion gap: 11 (ref 5–15)
BUN: 19 mg/dL (ref 8–23)
CO2: 24 mmol/L (ref 22–32)
Calcium: 9.1 mg/dL (ref 8.9–10.3)
Chloride: 98 mmol/L (ref 98–111)
Creatinine, Ser: 0.93 mg/dL (ref 0.44–1.00)
GFR, Estimated: >60 mL/min (ref >60)
Glucose, Bld: 117 mg/dL — ABNORMAL HIGH (ref 70–99)
Potassium: 4.4 mmol/L (ref 3.5–5.1)
Sodium: 133 mmol/L — ABNORMAL LOW (ref 135–145)

## 2020-11-02 LAB — CBC
HCT: 25.8 % — ABNORMAL LOW (ref 36.0–46.0)
Hemoglobin: 9.2 g/dL — ABNORMAL LOW (ref 12.0–15.0)
MCH: 33.2 pg (ref 26.0–34.0)
MCHC: 35.7 g/dL (ref 30.0–36.0)
MCV: 93.1 fL (ref 80.0–100.0)
Platelets: 137 10*3/uL — ABNORMAL LOW (ref 150–400)
RBC: 2.77 MIL/uL — ABNORMAL LOW (ref 3.87–5.11)
RDW: 12.8 % (ref 11.5–15.5)
WBC: 6.4 10*3/uL (ref 4.0–10.5)
nRBC: 0 % (ref 0.0–0.2)

## 2020-11-02 MED ORDER — ACETAMINOPHEN 325 MG PO TABS
650.0000 mg | ORAL_TABLET | Freq: Four times a day (QID) | ORAL | Status: DC
Start: 2020-11-02 — End: 2020-11-08
  Administered 2020-11-02 – 2020-11-08 (×23): 650 mg via ORAL
  Filled 2020-11-02 (×22): qty 2

## 2020-11-02 MED ORDER — FENTANYL CITRATE PF 50 MCG/ML IJ SOSY
12.5000 ug | PREFILLED_SYRINGE | Freq: Once | INTRAMUSCULAR | Status: AC
  Administered 2020-11-02: 12.5 ug via INTRAVENOUS
  Filled 2020-11-02: qty 1

## 2020-11-02 MED ORDER — OXYCODONE HCL 5 MG PO TABS
10.0000 mg | ORAL_TABLET | Freq: Four times a day (QID) | ORAL | Status: DC | PRN
  Administered 2020-11-02 – 2020-11-08 (×14): 10 mg via ORAL
  Filled 2020-11-02 (×14): qty 2

## 2020-11-02 MED ORDER — PROCHLORPERAZINE EDISYLATE 10 MG/2ML IJ SOLN
5.0000 mg | Freq: Once | INTRAMUSCULAR | Status: AC
  Administered 2020-11-02: 5 mg via INTRAVENOUS
  Filled 2020-11-02: qty 1

## 2020-11-02 MED ORDER — POLYETHYLENE GLYCOL 3350 17 G PO PACK
17.0000 g | PACK | Freq: Every day | ORAL | Status: DC
  Administered 2020-11-02 – 2020-11-08 (×7): 17 g via ORAL
  Filled 2020-11-02 (×7): qty 1

## 2020-11-02 NOTE — Plan of Care (Signed)

## 2020-11-02 NOTE — TOC Progression Note (Addendum)
Transition of Care [facility]) - Progression Note    Patient Details  Name: Alison Price MRN: [id number] Date of Birth: 11-29-1945  [facility]) CM/SW Contact  Barrie Dunker, RN Phone Number: 11/02/2020, 1:03 PM  Clinical Narrative:     The patient is from home with her sister, will not be having surgery, May need to go to SNF, her sister is agreeable, awaiting PT and Ot eval for recommendation, PASSR number [id number]       Expected Discharge Plan and Services                                                 Social Determinants of Health (SDOH) Interventions    Readmission Risk Interventions No flowsheet data found.

## 2020-11-02 NOTE — Progress Notes (Addendum)
PROGRESS NOTE    MAKENLI DERSTINE  ZOX:[id number] DOB: [birth year] DOA: 11/01/2020 PCP: Jaclyn Shaggy, MD     Brief Narrative:   From admission hpi Alison Price is a 75 y.o. female with medical history significant for hypertension, GERD, arthritis who presents to the ER for evaluation following a fall.  Patient states that she had gone outside to use the bathroom and while walking back into the house she tripped on concrete and fell.  She denies any loss of consciousness and denied feeling dizzy or lightheaded prior to the fall.  She complained of pain in her left arm as well as her right knee and was unable to get up. EMS was called and patient was transported to the ER for further evaluation. She denies having any dizziness, no lightheadedness, no chest pain, no shortness of breath, no nausea, vomiting, no diaphoresis, no palpitations, no abdominal pain, no changes in her bowel habits, no urinary symptoms, no fever, no chills, no cough, no leg swelling, no orthopnea or paroxysmal nocturnal dyspnea   Assessment & Plan:   Principal Problem:   Right patella fracture Active Problems:   Essential hypertension   Gastroesophageal reflux disease without esophagitis   Left ulnar fracture   Left radial fracture   Fall   # Right patella fracture # Right tibal plateau fracture # Left wrist fracture Ortho following. Mechanical trip and fall - WBAT in knee immobilizer - non-weightbearing left hand, WBAT as tolerated through elbow - pt/ot consulted, will likely need SNF - outpt ortho f/u dr. Fayrene Fearing bowers 7-10 days - home oxy prn, tylenol scheduled, miralax standing  # Dementia Appears to be at baseline - home donepezil  # HTN Here mild elevation - home lisinopril, amlodipine  # Acute cystitis Endorses some burning with urination, recent culture growing mssa, had been treated w/ cephalosporin - will discuss abx w/ pharmd     DVT prophylaxis: lovenox Code Status: full,  need to confirm w/ sister Family Communication: sister updated telephonically 9/16  Level of care: Med-Surg Status is: Observation  The patient will require care spanning > 2 midnights and should be moved to inpatient because: Inpatient level of care appropriate due to severity of illness  Dispo: The patient is from: Home              Anticipated d/c is to: SNF              Patient currently is not medically stable to d/c.   Difficult to place patient No        Consultants:  ortho  Procedures: none  Antimicrobials:  none    Subjective: This morning moderate left wrist pain. Has appetite.  Objective: Vitals:   11/01/20 1900 11/01/20 2000 11/01/20 2100 11/02/20 0900  BP: (!) 163/73 (!) 143/86 (!) 152/85 (!) 147/77  Pulse: 95 88 88 84  Resp: Temp:   99.5 F (37.5 C) 99 F (37.2 C)  TempSrc:    Oral  SpO2: 98% 96% 99% 99%  Weight:      Height:       No intake or output data in the 24 hours ending 11/02/20 1018 Filed Weights   11/01/20 0019  Weight: 68 kg    Examination:  General exam: Appears calm and comfortable  Respiratory system: Clear to auscultation. Respiratory effort normal. Cardiovascular system: S1 & S2 heard, RRR. No JVD, murmurs, rubs, gallops or clicks. No pedal edema. Gastrointestinal system: Abdomen is nondistended,  soft and nontender. No organomegaly or masses felt. Normal bowel sounds heard. Central nervous system: Alert and oriented. No focal neurological deficits. Extremities: moving all 4. Right knee in immobilizer. Left forearm/wrist wrapped. warm Skin: No rashes, lesions or ulcers Psychiatry: confused, pleasant    Data Reviewed: I have personally reviewed following labs and imaging studies  CBC: Recent Labs  Lab 11/01/20 0601 11/02/20 0423  WBC 7.4 6.4  NEUTROABS 4.3  --   HGB 10.4* 9.2*  HCT 30.1* 25.8*  MCV 96.2 93.1  PLT 148* 137*   Basic Metabolic Panel: Recent Labs  Lab 11/01/20 0601 11/02/20 0423   NA 141 133*  K 3.9 4.4  CL 102 98  CO2 29 24  GLUCOSE 101* 117*  BUN 20 19  CREATININE 0.80 0.93  CALCIUM 10.2 9.1   GFR: Estimated Creatinine Clearance: 57 mL/min (by C-G formula based on SCr of 0.93 mg/dL). Liver Function Tests: Recent Labs  Lab 11/01/20 0601  AST 29  ALT 16  ALKPHOS 54  BILITOT 0.5  PROT 7.9  ALBUMIN 4.5   No results for input(s): LIPASE, AMYLASE in the last 168 hours. No results for input(s): AMMONIA in the last 168 hours. Coagulation Profile: No results for input(s): INR, PROTIME in the last 168 hours. Cardiac Enzymes: No results for input(s): CKTOTAL, CKMB, CKMBINDEX, TROPONINI in the last 168 hours. BNP (last 3 results) No results for input(s): PROBNP in the last 8760 hours. HbA1C: No results for input(s): HGBA1C in the last 72 hours. CBG: No results for input(s): GLUCAP in the last 168 hours. Lipid Profile: No results for input(s): CHOL, HDL, LDLCALC, TRIG, CHOLHDL, LDLDIRECT in the last 72 hours. Thyroid Function Tests: No results for input(s): TSH, T4TOTAL, FREET4, T3FREE, THYROIDAB in the last 72 hours. Anemia Panel: No results for input(s): VITAMINB12, FOLATE, FERRITIN, TIBC, IRON, RETICCTPCT in the last 72 hours. Urine analysis:    Component Value Date/Time   COLORURINE STRAW (A) 04/13/2020 1216   APPEARANCEUR Cloudy (A) 10/23/2020 1602   LABSPEC 1.020 04/13/2020 1216   LABSPEC 1.010 04/17/2011 2348   PHURINE 7.0 04/13/2020 1216   GLUCOSEU Negative 10/23/2020 1602   GLUCOSEU Negative 04/17/2011 2348   HGBUR NEGATIVE 04/13/2020 1216   BILIRUBINUR Negative 10/23/2020 1602   BILIRUBINUR Negative 04/17/2011 2348   KETONESUR NEGATIVE 04/13/2020 1216   PROTEINUR 1+ (A) 10/23/2020 1602   PROTEINUR NEGATIVE 04/13/2020 1216   UROBILINOGEN 0.2 06/26/2015 1204   NITRITE Negative 10/23/2020 1602   NITRITE POSITIVE (A) 04/13/2020 1216   LEUKOCYTESUR 2+ (A) 10/23/2020 1602   LEUKOCYTESUR TRACE (A) 04/13/2020 1216   LEUKOCYTESUR Negative  04/17/2011 2348   Sepsis Labs: @LABRCNTIP (procalcitonin:4,lacticidven:4)  ) Recent Results (from the past 240 hour(s))  Microscopic Examination     Status: Abnormal   Collection Time: 10/23/20  4:02 PM   Urine  Result Value Ref Range Status   WBC, UA >30 (A) 0 - 5 /hpf Final   RBC >30 (A) 0 - 2 /hpf Final   Epithelial Cells (non renal) 0-10 0 - 10 /hpf Final   Casts Present (A) None seen /lpf Final   Cast Type Granular casts (A) N/A Final   Bacteria, UA Moderate (A) None seen/Few Final  CULTURE, URINE COMPREHENSIVE     Status: Abnormal   Collection Time: 10/24/20  2:49 PM   Specimen: Urine   UR  Result Value Ref Range Status   Urine Culture, Comprehensive Final report (A)  Final   Organism ID, Bacteria Staphylococcus aureus (A)  Final    Comment: Greater than 100,000 colony forming units per mL Based on resistance to oxacillin this isolate would be resistant to all currently available beta-lactam antimicrobial agents, with the exception of the newer cephalosporins with anti-MRSA activity, such as Ceftaroline    ANTIMICROBIAL SUSCEPTIBILITY Comment  Final    Comment:       ** S = Susceptible; I = Intermediate; R = Resistant **                    P = Positive; N = Negative             MICS are expressed in micrograms per mL    Antibiotic                 RSLT#1    RSLT#2    RSLT#3    RSLT#4 Ciprofloxacin                  R Gentamicin                     S Levofloxacin                   R Linezolid                      S Nitrofurantoin                 S Oxacillin                      R Penicillin                     R Rifampin                       S Tetracycline                   S Trimethoprim/Sulfa             S Vancomycin                     S   Resp Panel by RT-PCR (Flu A&B, Covid) Nasopharyngeal Swab     Status: None   Collection Time: 11/01/20  6:34 AM   Specimen: Nasopharyngeal Swab; Nasopharyngeal(NP) swabs in vial transport medium  Result Value Ref Range Status    SARS Coronavirus 2 by RT PCR NEGATIVE NEGATIVE Final    Comment: (NOTE) SARS-CoV-2 target nucleic acids are NOT DETECTED.  The SARS-CoV-2 RNA is generally detectable in upper respiratory specimens during the acute phase of infection. The lowest concentration of SARS-CoV-2 viral copies this assay can detect is 138 copies/mL. A negative result does not preclude SARS-Cov-2 infection and should not be used as the sole basis for treatment or other patient management decisions. A negative result may occur with  improper specimen collection/handling, submission of specimen other than nasopharyngeal swab, presence of viral mutation(s) within the areas targeted by this assay, and inadequate number of viral copies(<138 copies/mL). A negative result must be combined with clinical observations, patient history, and epidemiological information. The expected result is Negative.  Fact Sheet for Patients:  BloggerCourse.com  Fact Sheet for Healthcare Providers:  SeriousBroker.it  This test is no t yet approved or cleared by the [place] FDA and  has been authorized for detection and/or diagnosis of SARS-CoV-2 by FDA under an Emergency Use Authorization (EUA).  This EUA will remain  in effect (meaning this test can be used) for the duration of the COVID-19 declaration under Section 564(b)(1) of the Act, 21 U.S.C.section 360bbb-3(b)(1), unless the authorization is terminated  or revoked sooner.       Influenza A by PCR NEGATIVE NEGATIVE Final   Influenza B by PCR NEGATIVE NEGATIVE Final    Comment: (NOTE) The Xpert Xpress SARS-CoV-2/FLU/RSV plus assay is intended as an aid in the diagnosis of influenza from Nasopharyngeal swab specimens and should not be used as a sole basis for treatment. Nasal washings and aspirates are unacceptable for Xpert Xpress SARS-CoV-2/FLU/RSV testing.  Fact Sheet for  Patients: BloggerCourse.com  Fact Sheet for Healthcare Providers: SeriousBroker.it  This test is not yet approved or cleared by the [place] FDA and has been authorized for detection and/or diagnosis of SARS-CoV-2 by FDA under an Emergency Use Authorization (EUA). This EUA will remain in effect (meaning this test can be used) for the duration of the COVID-19 declaration under Section 564(b)(1) of the Act, 21 U.S.C. section 360bbb-3(b)(1), unless the authorization is terminated or revoked.  Performed at [facility], [address], [place], [place] [address]          Radiology Studies: DG Elbow 2 Views Left  Result Date: 11/01/2020 CLINICAL DATA:  Mechanical fall with left elbow pain. EXAM: LEFT ELBOW - 2 VIEW COMPARISON:  None. FINDINGS: Suspicious lucency through the radial head on the frontal view, but no joint effusion. No malalignment. Generalized osteopenia. IMPRESSION: Lucency at the radial head could reflect a nondepressed fracture but there is no detectable joint effusion. Could immobilize and obtain follow-up depending on level of clinical suspicion. Electronically Signed   By: Tiburcio Pea M.D.   On: 11/01/2020 06:34   DG Wrist Complete Left  Result Date: 11/01/2020 CLINICAL DATA:  Status post fall. EXAM: LEFT WRIST - COMPLETE 3+ VIEW COMPARISON:  None. FINDINGS: An acute, comminuted, mildly displaced fracture deformity is seen involving the distal left radius. Extension to involve the radiocarpal articulation is noted. An additional acute fracture is seen involving the distal left ulna. There is no evidence of dislocation. Marked severity degenerative changes are seen throughout the left wrist. There is mild diffuse soft tissue swelling. IMPRESSION: Acute fractures of the distal left radius and distal left ulna. Electronically Signed   By: Aram Candela M.D.   On: 11/01/2020 01:03   CT HEAD WO CONTRAST  ( )  Result Date: 11/01/2020 CLINICAL DATA:  Head trauma EXAM: CT HEAD WITHOUT CONTRAST CT MAXILLOFACIAL WITHOUT CONTRAST CT CERVICAL SPINE WITHOUT CONTRAST TECHNIQUE: Multidetector CT imaging of the head, cervical spine, and maxillofacial structures were performed using the standard protocol without intravenous contrast. Multiplanar CT image reconstructions of the cervical spine and maxillofacial structures were also generated. COMPARISON:  None. FINDINGS: CT HEAD FINDINGS Brain: There is no mass, hemorrhage or extra-axial collection. There is generalized atrophy without lobar predilection. There is hypoattenuation of the periventricular white matter, most commonly indicating chronic ischemic microangiopathy. Vascular: No abnormal hyperdensity of the major intracranial arteries or dural venous sinuses. No intracranial atherosclerosis. Skull: The visualized skull base, calvarium and extracranial soft tissues are normal. CT MAXILLOFACIAL FINDINGS Osseous: --Complex facial fracture types: No LeFort, zygomaticomaxillary complex or nasoorbitoethmoidal fracture. --Simple fracture types: None. --Mandible: No fracture or dislocation. Orbits: The globes are intact. Normal appearance of the intra- and extraconal fat. Symmetric extraocular muscles and optic nerves. Sinuses: No fluid levels or advanced mucosal thickening. Soft tissues: Normal visualized extracranial soft tissues.  CT CERVICAL SPINE FINDINGS Alignment: No static subluxation. Facets are aligned. Occipital condyles and the lateral masses of C1-C2 are aligned. Skull base and vertebrae: No acute fracture. Chronic compression deformities at C6 and C7. Soft tissues and spinal canal: No prevertebral fluid or swelling. No visible canal hematoma. Disc levels: Severe diffuse facet arthrosis. This is worse on the left. Upper chest: No pneumothorax, pulmonary nodule or pleural effusion. Other: Normal visualized paraspinal cervical soft tissues. IMPRESSION: 1. Chronic  ischemic microangiopathy and generalized atrophy without acute intracranial abnormality. 2. No facial fracture. 3. No acute fracture or static subluxation of the cervical spine. 4. Chronic compression deformities at C6 and C7. Electronically Signed   By: Deatra Robinson M.D.   On: 11/01/2020 01:41   CT Cervical Spine Wo Contrast  Result Date: 11/01/2020 CLINICAL DATA:  Head trauma EXAM: CT HEAD WITHOUT CONTRAST CT MAXILLOFACIAL WITHOUT CONTRAST CT CERVICAL SPINE WITHOUT CONTRAST TECHNIQUE: Multidetector CT imaging of the head, cervical spine, and maxillofacial structures were performed using the standard protocol without intravenous contrast. Multiplanar CT image reconstructions of the cervical spine and maxillofacial structures were also generated. COMPARISON:  None. FINDINGS: CT HEAD FINDINGS Brain: There is no mass, hemorrhage or extra-axial collection. There is generalized atrophy without lobar predilection. There is hypoattenuation of the periventricular white matter, most commonly indicating chronic ischemic microangiopathy. Vascular: No abnormal hyperdensity of the major intracranial arteries or dural venous sinuses. No intracranial atherosclerosis. Skull: The visualized skull base, calvarium and extracranial soft tissues are normal. CT MAXILLOFACIAL FINDINGS Osseous: --Complex facial fracture types: No LeFort, zygomaticomaxillary complex or nasoorbitoethmoidal fracture. --Simple fracture types: None. --Mandible: No fracture or dislocation. Orbits: The globes are intact. Normal appearance of the intra- and extraconal fat. Symmetric extraocular muscles and optic nerves. Sinuses: No fluid levels or advanced mucosal thickening. Soft tissues: Normal visualized extracranial soft tissues. CT CERVICAL SPINE FINDINGS Alignment: No static subluxation. Facets are aligned. Occipital condyles and the lateral masses of C1-C2 are aligned. Skull base and vertebrae: No acute fracture. Chronic compression deformities at C6  and C7. Soft tissues and spinal canal: No prevertebral fluid or swelling. No visible canal hematoma. Disc levels: Severe diffuse facet arthrosis. This is worse on the left. Upper chest: No pneumothorax, pulmonary nodule or pleural effusion. Other: Normal visualized paraspinal cervical soft tissues. IMPRESSION: 1. Chronic ischemic microangiopathy and generalized atrophy without acute intracranial abnormality. 2. No facial fracture. 3. No acute fracture or static subluxation of the cervical spine. 4. Chronic compression deformities at C6 and C7. Electronically Signed   By: Deatra Robinson M.D.   On: 11/01/2020 01:41   CT Knee Right Wo Contrast  Result Date: 11/01/2020 CLINICAL DATA:  Right knee pain after fall. EXAM: CT OF THE RIGHT KNEE WITHOUT CONTRAST TECHNIQUE: Multidetector CT imaging of the right knee was performed according to the standard protocol. Multiplanar CT image reconstructions were also generated. COMPARISON:  Right knee x-rays from same day. FINDINGS: Bones/Joint/Cartilage Acute nondisplaced intra-articular fracture of the patella. Possible minimally depressed fracture of the lateral tibial plateau mesial aspect with cortical buckling (series 5, image 76). No dislocation. Mild medial and lateral compartment joint space narrowing with small marginal osteophytes. Large lipohemarthrosis. Osteopenia. Ligaments Ligaments are suboptimally evaluated by CT. Muscles and Tendons Grossly intact. Soft tissue No fluid collection or hematoma.  No soft tissue mass. IMPRESSION: 1. Acute nondisplaced intra-articular fracture of the patella. 2. Possible minimally depressed fracture of the lateral tibial plateau mesial aspect. 3. Large lipohemarthrosis. Electronically Signed   By: Vickki Hearing.D.  On: 11/01/2020 08:18   DG Knee Complete 4 Views Right  Result Date: 11/01/2020 CLINICAL DATA:  Status post fall. EXAM: RIGHT KNEE - COMPLETE 4+ VIEW COMPARISON:  None. FINDINGS: Acute nondisplaced fracture of the  right patella is seen. There is no evidence of dislocation. Patellofemoral compartment space narrowing is seen. Medial and lateral tibiofemoral compartment space narrowing is also noted. There is a small to moderate sized joint effusion. IMPRESSION: 1. Acute nondisplaced fracture of the right patella. 2. Small to moderate sized joint effusion. 3. Tricompartmental degenerative changes. Electronically Signed   By: Aram Candela M.D.   On: 11/01/2020 01:05   CT Maxillofacial Wo Contrast  Result Date: 11/01/2020 CLINICAL DATA:  Head trauma EXAM: CT HEAD WITHOUT CONTRAST CT MAXILLOFACIAL WITHOUT CONTRAST CT CERVICAL SPINE WITHOUT CONTRAST TECHNIQUE: Multidetector CT imaging of the head, cervical spine, and maxillofacial structures were performed using the standard protocol without intravenous contrast. Multiplanar CT image reconstructions of the cervical spine and maxillofacial structures were also generated. COMPARISON:  None. FINDINGS: CT HEAD FINDINGS Brain: There is no mass, hemorrhage or extra-axial collection. There is generalized atrophy without lobar predilection. There is hypoattenuation of the periventricular white matter, most commonly indicating chronic ischemic microangiopathy. Vascular: No abnormal hyperdensity of the major intracranial arteries or dural venous sinuses. No intracranial atherosclerosis. Skull: The visualized skull base, calvarium and extracranial soft tissues are normal. CT MAXILLOFACIAL FINDINGS Osseous: --Complex facial fracture types: No LeFort, zygomaticomaxillary complex or nasoorbitoethmoidal fracture. --Simple fracture types: None. --Mandible: No fracture or dislocation. Orbits: The globes are intact. Normal appearance of the intra- and extraconal fat. Symmetric extraocular muscles and optic nerves. Sinuses: No fluid levels or advanced mucosal thickening. Soft tissues: Normal visualized extracranial soft tissues. CT CERVICAL SPINE FINDINGS Alignment: No static subluxation.  Facets are aligned. Occipital condyles and the lateral masses of C1-C2 are aligned. Skull base and vertebrae: No acute fracture. Chronic compression deformities at C6 and C7. Soft tissues and spinal canal: No prevertebral fluid or swelling. No visible canal hematoma. Disc levels: Severe diffuse facet arthrosis. This is worse on the left. Upper chest: No pneumothorax, pulmonary nodule or pleural effusion. Other: Normal visualized paraspinal cervical soft tissues. IMPRESSION: 1. Chronic ischemic microangiopathy and generalized atrophy without acute intracranial abnormality. 2. No facial fracture. 3. No acute fracture or static subluxation of the cervical spine. 4. Chronic compression deformities at C6 and C7. Electronically Signed   By: Deatra Robinson M.D.   On: 11/01/2020 01:41        Scheduled Meds:  amLODipine  5 mg Oral Daily   cholecalciferol  5,000 Units Oral Daily   donepezil  5 mg Oral Daily   enoxaparin (LOVENOX) injection  40 mg Subcutaneous Q24H   lisinopril  20 mg Oral Daily   multivitamin with minerals  1 tablet Oral Daily   oxyCODONE  10 mg Oral TID   pantoprazole  40 mg Oral Daily   senna  1 tablet Oral Daily   Continuous Infusions:  methocarbamol (ROBAXIN) IV       LOS: 0 days    Time spent: 45 min    Silvano Bilis, MD [facility]   If 7PM-7AM, please contact night-coverage [email or web address] Password [facility] 11/02/2020, 10:18 AM

## 2020-11-03 DIAGNOSIS — M1990 Unspecified osteoarthritis, unspecified site: Secondary | ICD-10-CM | POA: Diagnosis present

## 2020-11-03 DIAGNOSIS — S42402A Unspecified fracture of lower end of left humerus, initial encounter for closed fracture: Secondary | ICD-10-CM | POA: Diagnosis present

## 2020-11-03 DIAGNOSIS — M810 Age-related osteoporosis without current pathological fracture: Secondary | ICD-10-CM | POA: Diagnosis present

## 2020-11-03 DIAGNOSIS — Z8049 Family history of malignant neoplasm of other genital organs: Secondary | ICD-10-CM | POA: Diagnosis not present

## 2020-11-03 DIAGNOSIS — W010XXA Fall on same level from slipping, tripping and stumbling without subsequent striking against object, initial encounter: Secondary | ICD-10-CM | POA: Diagnosis present

## 2020-11-03 DIAGNOSIS — Y92098 Other place in other non-institutional residence as the place of occurrence of the external cause: Secondary | ICD-10-CM | POA: Diagnosis not present

## 2020-11-03 DIAGNOSIS — K219 Gastro-esophageal reflux disease without esophagitis: Secondary | ICD-10-CM | POA: Diagnosis present

## 2020-11-03 DIAGNOSIS — R110 Nausea: Secondary | ICD-10-CM | POA: Diagnosis not present

## 2020-11-03 DIAGNOSIS — D696 Thrombocytopenia, unspecified: Secondary | ICD-10-CM | POA: Diagnosis present

## 2020-11-03 DIAGNOSIS — Y9301 Activity, walking, marching and hiking: Secondary | ICD-10-CM | POA: Diagnosis present

## 2020-11-03 DIAGNOSIS — R8271 Bacteriuria: Secondary | ICD-10-CM | POA: Diagnosis present

## 2020-11-03 DIAGNOSIS — S62109A Fracture of unspecified carpal bone, unspecified wrist, initial encounter for closed fracture: Secondary | ICD-10-CM | POA: Diagnosis present

## 2020-11-03 DIAGNOSIS — W19XXXA Unspecified fall, initial encounter: Secondary | ICD-10-CM | POA: Diagnosis not present

## 2020-11-03 DIAGNOSIS — Z20822 Contact with and (suspected) exposure to covid-19: Secondary | ICD-10-CM | POA: Diagnosis present

## 2020-11-03 DIAGNOSIS — D649 Anemia, unspecified: Secondary | ICD-10-CM | POA: Diagnosis present

## 2020-11-03 DIAGNOSIS — G8929 Other chronic pain: Secondary | ICD-10-CM | POA: Diagnosis present

## 2020-11-03 DIAGNOSIS — S52602A Unspecified fracture of lower end of left ulna, initial encounter for closed fracture: Secondary | ICD-10-CM | POA: Diagnosis present

## 2020-11-03 DIAGNOSIS — S80211A Abrasion, right knee, initial encounter: Secondary | ICD-10-CM | POA: Diagnosis present

## 2020-11-03 DIAGNOSIS — I10 Essential (primary) hypertension: Secondary | ICD-10-CM | POA: Diagnosis present

## 2020-11-03 DIAGNOSIS — F0390 Unspecified dementia without behavioral disturbance: Secondary | ICD-10-CM | POA: Diagnosis present

## 2020-11-03 DIAGNOSIS — S82001A Unspecified fracture of right patella, initial encounter for closed fracture: Secondary | ICD-10-CM | POA: Diagnosis present

## 2020-11-03 DIAGNOSIS — Z23 Encounter for immunization: Secondary | ICD-10-CM | POA: Diagnosis present

## 2020-11-03 DIAGNOSIS — K5900 Constipation, unspecified: Secondary | ICD-10-CM | POA: Diagnosis present

## 2020-11-03 DIAGNOSIS — S52502A Unspecified fracture of the lower end of left radius, initial encounter for closed fracture: Secondary | ICD-10-CM | POA: Diagnosis present

## 2020-11-03 DIAGNOSIS — R309 Painful micturition, unspecified: Secondary | ICD-10-CM | POA: Diagnosis present

## 2020-11-03 DIAGNOSIS — Z800 Family history of malignant neoplasm of digestive organs: Secondary | ICD-10-CM | POA: Diagnosis not present

## 2020-11-03 DIAGNOSIS — S0181XA Laceration without foreign body of other part of head, initial encounter: Secondary | ICD-10-CM | POA: Diagnosis present

## 2020-11-03 DIAGNOSIS — S82141A Displaced bicondylar fracture of right tibia, initial encounter for closed fracture: Secondary | ICD-10-CM | POA: Diagnosis present

## 2020-11-03 DIAGNOSIS — N179 Acute kidney failure, unspecified: Secondary | ICD-10-CM | POA: Diagnosis not present

## 2020-11-03 LAB — CBC
HCT: 23.6 % — ABNORMAL LOW (ref 36.0–46.0)
Hemoglobin: 8.1 g/dL — ABNORMAL LOW (ref 12.0–15.0)
MCH: 32.4 pg (ref 26.0–34.0)
MCHC: 34.3 g/dL (ref 30.0–36.0)
MCV: 94.4 fL (ref 80.0–100.0)
Platelets: 117 10*3/uL — ABNORMAL LOW (ref 150–400)
RBC: 2.5 MIL/uL — ABNORMAL LOW (ref 3.87–5.11)
RDW: 13 % (ref 11.5–15.5)
WBC: 4.8 10*3/uL (ref 4.0–10.5)
nRBC: 0 % (ref 0.0–0.2)

## 2020-11-03 LAB — BASIC METABOLIC PANEL
Anion gap: 7 (ref 5–15)
BUN: 29 mg/dL — ABNORMAL HIGH (ref 8–23)
CO2: 28 mmol/L (ref 22–32)
Calcium: 8.8 mg/dL — ABNORMAL LOW (ref 8.9–10.3)
Chloride: 97 mmol/L — ABNORMAL LOW (ref 98–111)
Creatinine, Ser: 1.54 mg/dL — ABNORMAL HIGH (ref 0.44–1.00)
GFR, Estimated: 35 mL/min — ABNORMAL LOW (ref >60)
Glucose, Bld: 100 mg/dL — ABNORMAL HIGH (ref 70–99)
Potassium: 4.7 mmol/L (ref 3.5–5.1)
Sodium: 132 mmol/L — ABNORMAL LOW (ref 135–145)

## 2020-11-03 LAB — SAVE SMEAR(SSMR), FOR PROVIDER SLIDE REVIEW

## 2020-11-03 MED ORDER — SODIUM CHLORIDE 0.9 % IV SOLN: INTRAVENOUS | Status: AC

## 2020-11-03 MED ORDER — LACTULOSE 10 GM/15ML PO SOLN
10.0000 g | Freq: Two times a day (BID) | ORAL | Status: DC
  Administered 2020-11-03 – 2020-11-04 (×3): 10 g via ORAL
  Filled 2020-11-03 (×4): qty 30

## 2020-11-03 NOTE — Progress Notes (Signed)
PROGRESS NOTE    Alison Price  ZOX:[id number] DOB: 1945-10-15 DOA: 11/01/2020 PCP: Jaclyn Shaggy, MD     Brief Narrative:   From admission hpi Alison Price is a 76 y.o. female with medical history significant for hypertension, GERD, arthritis who presents to the ER for evaluation following a fall.  Patient states that she had gone outside to use the bathroom and while walking back into the house she tripped on concrete and fell.  She denies any loss of consciousness and denied feeling dizzy or lightheaded prior to the fall.  She complained of pain in her left arm as well as her right knee and was unable to get up. EMS was called and patient was transported to the ER for further evaluation. She denies having any dizziness, no lightheadedness, no chest pain, no shortness of breath, no nausea, vomiting, no diaphoresis, no palpitations, no abdominal pain, no changes in her bowel habits, no urinary symptoms, no fever, no chills, no cough, no leg swelling, no orthopnea or paroxysmal nocturnal dyspnea   Assessment & Plan:   Principal Problem:   Right patella fracture Active Problems:   Essential hypertension   Gastroesophageal reflux disease without esophagitis   Left ulnar fracture   Left radial fracture   Fall   # Right patella fracture # Right tibal plateau fracture # Left wrist fracture Ortho following. Mechanical trip and fall - WBAT in knee immobilizer - non-weightbearing left hand, WBAT as tolerated through elbow - pt/ot consulted, needs snf, bed search underway - outpt ortho f/u dr. Fayrene Fearing bowers 7-10 days - home oxy prn, tylenol scheduled, miralax standing. Adding lactulose today  # Anemia Hgb 8.1 today from baseline 9s-10s. No melena or hematochezia or other bleeding. Has moderate right knee swelling, no overt signs of hematoma - monitor  # thrombocytopenia Hx of mild thrombocytopenia in the past, plts 130s today - f/u hiv, hcv, smear  # AKI Cr 1.5 today  from baseline 1, likely 2/2 decreased PO - add fluids - hold bp meds - monitor  # Dementia Appears to be at baseline - home donepezil  # HTN Here low bp today, alert - start fluids - hold home lisinopril, amlodipine  # bacteriuria Equivocal symptoms, this morning denies burning. Staph aureus grown on recent culture, therapy options are limited - will monitor, would treat if overt signs uti   DVT prophylaxis: lovenox Code Status: full Family Communication: sister updated telephonically 9/16  Level of care: Med-Surg Status is: Observation  The patient will require care spanning > 2 midnights and should be moved to inpatient because: Inpatient level of care appropriate due to severity of illness  Dispo: The patient is from: Home              Anticipated d/c is to: SNF              Patient currently is not medically stable to d/c.   Difficult to place patient No     Consultants:  ortho  Procedures: none  Antimicrobials:  none    Subjective: This morning moderate left wrist pain. Has appetite. No bleeding  Objective: Vitals:   11/02/20 1615 11/02/20 2301 11/03/20 0528 11/03/20 0916  BP: (!) 97/54 (!) 129/44 (!) 93/45 (!) 97/53  Pulse: 66 72 63 67  Resp: 18 20 18 14   Temp: 99.5 F (37.5 C)  98.6 F (37 C) 97.6 F (36.4 C)  TempSrc: Oral     SpO2: 98% 100% 95% 99%  Weight:      Height:        Intake/Output Summary (Last 24 hours) at 11/03/2020 1017 Last data filed at 11/03/2020 0529 Gross per 24 hour  Intake --  Output 700 ml  Net -700 ml   Filed Weights   11/01/20 0019  Weight: 68 kg    Examination:  General exam: Appears calm and comfortable  Respiratory system: Clear to auscultation. Respiratory effort normal. Cardiovascular system: S1 & S2 heard, RRR. No JVD, murmurs, rubs, gallops or clicks. No pedal edema. Gastrointestinal system: Abdomen is nondistended, soft and nontender. No organomegaly or masses felt. Normal bowel sounds  heard. Central nervous system: Alert and oriented. No focal neurological deficits. Extremities: moving all 4. Right knee in immobilizer. Left forearm/wrist wrapped. warm Skin: No rashes, lesions or ulcers Psychiatry: confused, pleasant    Data Reviewed: I have personally reviewed following labs and imaging studies  CBC: Recent Labs  Lab 11/01/20 0601 11/02/20 0423 11/03/20 0507  WBC 7.4 6.4 4.8  NEUTROABS 4.3  --   --   HGB 10.4* 9.2* 8.1*  HCT 30.1* 25.8* 23.6*  MCV 96.2 93.1 94.4  PLT 148* 137* 117*   Basic Metabolic Panel: Recent Labs  Lab 11/01/20 0601 11/02/20 0423 11/03/20 0507  NA 141 133* 132*  K 3.9 4.4 4.7  CL 102 98 97*  CO2 29 24 28   GLUCOSE 101* 117* 100*  BUN 20 19 29*  CREATININE 0.80 0.93 1.54*  CALCIUM 10.2 9.1 8.8*   GFR: Estimated Creatinine Clearance: 34.4 mL/min (A) (by C-G formula based on SCr of 1.54 mg/dL (H)). Liver Function Tests: Recent Labs  Lab 11/01/20 0601  AST 29  ALT 16  ALKPHOS 54  BILITOT 0.5  PROT 7.9  ALBUMIN 4.5   No results for input(s): LIPASE, AMYLASE in the last 168 hours. No results for input(s): AMMONIA in the last 168 hours. Coagulation Profile: No results for input(s): INR, PROTIME in the last 168 hours. Cardiac Enzymes: No results for input(s): CKTOTAL, CKMB, CKMBINDEX, TROPONINI in the last 168 hours. BNP (last 3 results) No results for input(s): PROBNP in the last 8760 hours. HbA1C: No results for input(s): HGBA1C in the last 72 hours. CBG: No results for input(s): GLUCAP in the last 168 hours. Lipid Profile: No results for input(s): CHOL, HDL, LDLCALC, TRIG, CHOLHDL, LDLDIRECT in the last 72 hours. Thyroid Function Tests: No results for input(s): TSH, T4TOTAL, FREET4, T3FREE, THYROIDAB in the last 72 hours. Anemia Panel: No results for input(s): VITAMINB12, FOLATE, FERRITIN, TIBC, IRON, RETICCTPCT in the last 72 hours. Urine analysis:    Component Value Date/Time   COLORURINE STRAW (A) 04/13/2020  1216   APPEARANCEUR Cloudy (A) 10/23/2020 1602   LABSPEC 1.020 04/13/2020 1216   LABSPEC 1.010 04/17/2011 2348   PHURINE 7.0 04/13/2020 1216   GLUCOSEU Negative 10/23/2020 1602   GLUCOSEU Negative 04/17/2011 2348   HGBUR NEGATIVE 04/13/2020 1216   BILIRUBINUR Negative 10/23/2020 1602   BILIRUBINUR Negative 04/17/2011 2348   KETONESUR NEGATIVE 04/13/2020 1216   PROTEINUR 1+ (A) 10/23/2020 1602   PROTEINUR NEGATIVE 04/13/2020 1216   UROBILINOGEN 0.2 06/26/2015 1204   NITRITE Negative 10/23/2020 1602   NITRITE POSITIVE (A) 04/13/2020 1216   LEUKOCYTESUR 2+ (A) 10/23/2020 1602   LEUKOCYTESUR TRACE (A) 04/13/2020 1216   LEUKOCYTESUR Negative 04/17/2011 2348   Sepsis Labs: @LABRCNTIP (procalcitonin:4,lacticidven:4)  ) Recent Results (from the past 240 hour(s))  CULTURE, URINE COMPREHENSIVE     Status: Abnormal   Collection Time: 10/24/20  2:49  PM   Specimen: Urine   UR  Result Value Ref Range Status   Urine Culture, Comprehensive Final report (A)  Final   Organism ID, Bacteria Staphylococcus aureus (A)  Final    Comment: Greater than 100,000 colony forming units per mL Based on resistance to oxacillin this isolate would be resistant to all currently available beta-lactam antimicrobial agents, with the exception of the newer cephalosporins with anti-MRSA activity, such as Ceftaroline    ANTIMICROBIAL SUSCEPTIBILITY Comment  Final    Comment:       ** S = Susceptible; I = Intermediate; R = Resistant **                    P = Positive; N = Negative             MICS are expressed in micrograms per mL    Antibiotic                 RSLT#1    RSLT#2    RSLT#3    RSLT#4 Ciprofloxacin                  R Gentamicin                     S Levofloxacin                   R Linezolid                      S Nitrofurantoin                 S Oxacillin                      R Penicillin                     R Rifampin                       S Tetracycline                   S Trimethoprim/Sulfa              S Vancomycin                     S   Resp Panel by RT-PCR (Flu A&B, Covid) Nasopharyngeal Swab     Status: None   Collection Time: 11/01/20  6:34 AM   Specimen: Nasopharyngeal Swab; Nasopharyngeal(NP) swabs in vial transport medium  Result Value Ref Range Status   SARS Coronavirus 2 by RT PCR NEGATIVE NEGATIVE Final    Comment: (NOTE) SARS-CoV-2 target nucleic acids are NOT DETECTED.  The SARS-CoV-2 RNA is generally detectable in upper respiratory specimens during the acute phase of infection. The lowest concentration of SARS-CoV-2 viral copies this assay can detect is 138 copies/mL. A negative result does not preclude SARS-Cov-2 infection and should not be used as the sole basis for treatment or other patient management decisions. A negative result may occur with  improper specimen collection/handling, submission of specimen other than nasopharyngeal swab, presence of viral mutation(s) within the areas targeted by this assay, and inadequate number of viral copies(<138 copies/mL). A negative result must be combined with clinical observations, patient history, and epidemiological information. The expected result is Negative.  Fact Sheet for Patients:  BloggerCourse.com  Fact Sheet for Healthcare Providers:  SeriousBroker.it  This test is no t yet approved or cleared by the [place] and  has been authorized for detection and/or diagnosis of SARS-CoV-2 by FDA under an Emergency Use Authorization (EUA). This EUA will remain  in effect (meaning this test can be used) for the duration of the COVID-19 declaration under Section 564(b)(1) of the Act, 21 U.S.C.section 360bbb-3(b)(1), unless the authorization is terminated  or revoked sooner.       Influenza A by PCR NEGATIVE NEGATIVE Final   Influenza B by PCR NEGATIVE NEGATIVE Final    Comment: (NOTE) The Xpert Xpress SARS-CoV-2/FLU/RSV plus assay is intended as  an aid in the diagnosis of influenza from Nasopharyngeal swab specimens and should not be used as a sole basis for treatment. Nasal washings and aspirates are unacceptable for Xpert Xpress SARS-CoV-2/FLU/RSV testing.  Fact Sheet for Patients: BloggerCourse.com  Fact Sheet for Healthcare Providers: SeriousBroker.it  This test is not yet approved or cleared by the [place] FDA and has been authorized for detection and/or diagnosis of SARS-CoV-2 by FDA under an Emergency Use Authorization (EUA). This EUA will remain in effect (meaning this test can be used) for the duration of the COVID-19 declaration under Section 564(b)(1) of the Act, 21 U.S.C. section 360bbb-3(b)(1), unless the authorization is terminated or revoked.  Performed at [facility], [address] [place], [place] [address]          Radiology Studies: No results found.      Scheduled Meds:  acetaminophen  650 mg Oral Q6H   cholecalciferol  5,000 Units Oral Daily   donepezil  5 mg Oral Daily   enoxaparin (LOVENOX) injection  40 mg Subcutaneous Q24H   multivitamin with minerals  1 tablet Oral Daily   pantoprazole  40 mg Oral Daily   polyethylene glycol  17 g Oral Daily   senna  1 tablet Oral Daily   Continuous Infusions:  sodium chloride 125 mL/hr at 11/03/20 0924   methocarbamol (ROBAXIN) IV       LOS: 0 days    Time spent: 25 min    Silvano Bilis, MD [facility]   If 7PM-7AM, please contact night-coverage [email or web address] Password [facility] 11/03/2020, 10:17 AM

## 2020-11-03 NOTE — NC FL2 (Signed)
[place] MEDICAID FL2 LEVEL OF CARE SCREENING TOOL     IDENTIFICATION  Patient Name: Alison Price Birthdate: [birth year] Sex: female Admission Date (Current Location): 11/01/2020  [facility] and [place] Number:  Chiropodist and Address:  [facility], [address], [place], [place] [address]      Provider Number: [id number]  Attending Physician Name and Address:  Kathrynn Running, MD  Relative Name and Phone Number:  Amarys, Sliwinski (Sister)   [phone] [facility])    Current Level of Care: Hospital Recommended Level of Care: Skilled Nursing Facility Prior Approval Number:    Date Approved/Denied:   PASRR Number: [id number]  Discharge Plan: SNF    Current Diagnoses: Patient Active Problem List   Diagnosis Date Noted   Wrist fracture 11/03/2020   Right patella fracture 11/01/2020   Left ulnar fracture 11/01/2020   Left radial fracture 11/01/2020   Fall 11/01/2020   Sepsis (HCC)    Acute pyelonephritis 04/13/2020   Obstructive uropathy 04/13/2020   Normocytic anemia 04/13/2020   Depression    Chronic pain    Protein-calorie malnutrition, severe 07/01/2019   Other chronic pain    Urinary tract infection due to extended-spectrum beta lactamase (ESBL) producing Escherichia coli    Weakness    Constipation    Abdominal pain 06/29/2019   Hypomagnesemia    Gastroesophageal reflux disease without esophagitis    Hip fx (HCC) 08/05/2017   Nausea and vomiting 10/23/2016   Hypokalemia 10/23/2016   Dehydration 10/23/2016   Essential hypertension 10/23/2016   History of total abdominal hysterectomy 06/26/2015   Pelvic pressure in female 06/26/2015   Vaginal atrophy 06/26/2015   Hematuria 06/26/2015   Dysuria 06/26/2015   Pancytopenia (HCC) 04/13/2015    Orientation RESPIRATION BLADDER Height & Weight     Self, Time, Situation, Place  Normal Incontinent Weight: 68 kg Height:  5\' 10"  (177.8 cm)  BEHAVIORAL SYMPTOMS/MOOD  NEUROLOGICAL BOWEL NUTRITION STATUS      Continent Diet  AMBULATORY STATUS COMMUNICATION OF NEEDS Skin                               Personal Care Assistance Level of Assistance              Functional Limitations Info             SPECIAL CARE FACTORS FREQUENCY  PT (By licensed PT), OT (By licensed OT)     PT Frequency: 5x/week OT Frequency: 5x/week            Contractures Contractures Info: Not present    Additional Factors Info  Code Status, Allergies, Isolation Precautions Code Status Info: Full Code Allergies Info: Codeine Medium Nausea And Vomiting   Sulfa Antibiotics Not Specified Other (See Comments) STEVEN JOHNSON SYNDROME    (See EHR for specifics)     Isolation Precautions Info: ESBL in blood/contact     Current Medications (11/03/2020):  This is the current hospital active medication list Current Facility-Administered Medications  Medication Dose Route Frequency Provider Last Rate Last Admin   0.9 %  sodium chloride infusion   Intravenous Continuous 11/05/2020, MD 125 mL/hr at 11/03/20 0924 New Bag at 11/03/20 0924   acetaminophen (TYLENOL) tablet 650 mg  650 mg Oral Q6H Wouk, 11/05/20, MD   650 mg at 11/03/20 1229   cholecalciferol (VITAMIN D) tablet 5,000 Units  5,000 Units Oral Daily 11/05/20, MD  5,000 Units at 11/03/20 0913   donepezil (ARICEPT) tablet 5 mg  5 mg Oral Daily Agbata, Tochukwu, MD   5 mg at 11/03/20 0913   enoxaparin (LOVENOX) injection 40 mg  40 mg Subcutaneous Q24H Agbata, Tochukwu, MD   40 mg at 11/03/20 0913   lactulose (CHRONULAC) 10 GM/15ML solution 10 g  10 g Oral BID Kathrynn Running, MD   10 g at 11/03/20 1534   methocarbamol (ROBAXIN) 500 mg in dextrose 5 % 50 mL IVPB  500 mg Intravenous Q6H PRN Agbata, Tochukwu, MD       multivitamin with minerals tablet 1 tablet  1 tablet Oral Daily Agbata, Tochukwu, MD   1 tablet at 11/03/20 0913   ondansetron (ZOFRAN) tablet 4 mg  4 mg Oral Q6H PRN Agbata,  Tochukwu, MD   4 mg at 11/01/20 1520   Or   ondansetron (ZOFRAN) injection 4 mg  4 mg Intravenous Q6H PRN Agbata, Tochukwu, MD   4 mg at 11/03/20 1229   oxyCODONE (Oxy IR/ROXICODONE) immediate release tablet 10 mg  10 mg Oral Q6H PRN Kathrynn Running, MD   10 mg at 11/03/20 1229   pantoprazole (PROTONIX) EC tablet 40 mg  40 mg Oral Daily Agbata, Tochukwu, MD   40 mg at 11/03/20 0913   polyethylene glycol (MIRALAX / GLYCOLAX) packet 17 g  17 g Oral Daily Kathrynn Running, MD   17 g at 11/03/20 0912   senna (SENOKOT) tablet 8.6 mg  1 tablet Oral Daily Agbata, Tochukwu, MD   8.6 mg at 11/03/20 9937     Discharge Medications: Please see discharge summary for a list of discharge medications.  Relevant Imaging Results:  Relevant Lab Results:   Additional Information Mechanical fall while outside using bathroom. Right patella fracture. Right tibal plateau fracture. Left wrist fracture.   WBAT in knee immobilizer  - non-weightbearing left hand, WBAT as tolerated through elbow  Bing Quarry, RN

## 2020-11-03 NOTE — Progress Notes (Signed)
   11/03/20 1245  Clinical Encounter Type  Visited With Patient  Visit Type Initial;Spiritual support;Social support  Referral From Other (Comment) (rounding)  Chaplain Burris was on unit and happened to meet and engage Pt. Initially chaplain introduced herself and inquired about Pt's well-being. Chaplain Burris provided reflective listening as Pt shared some history. Chaplain provided a compassionate and supportive presence; Pt stated "I'm so glad you came by" and hoped chaplain may be able to return.

## 2020-11-03 NOTE — Plan of Care (Signed)

## 2020-11-04 LAB — BASIC METABOLIC PANEL
Anion gap: 4 — ABNORMAL LOW (ref 5–15)
BUN: 22 mg/dL (ref 8–23)
CO2: 28 mmol/L (ref 22–32)
Calcium: 8.2 mg/dL — ABNORMAL LOW (ref 8.9–10.3)
Chloride: 104 mmol/L (ref 98–111)
Creatinine, Ser: 1.02 mg/dL — ABNORMAL HIGH (ref 0.44–1.00)
GFR, Estimated: 58 mL/min — ABNORMAL LOW (ref >60)
Glucose, Bld: 106 mg/dL — ABNORMAL HIGH (ref 70–99)
Potassium: 4.1 mmol/L (ref 3.5–5.1)
Sodium: 136 mmol/L (ref 135–145)

## 2020-11-04 LAB — HIV ANTIBODY (ROUTINE TESTING W REFLEX): HIV Screen 4th Generation wRfx: NONREACTIVE

## 2020-11-04 LAB — CBC
HCT: 23.6 % — ABNORMAL LOW (ref 36.0–46.0)
Hemoglobin: 8.1 g/dL — ABNORMAL LOW (ref 12.0–15.0)
MCH: 32.1 pg (ref 26.0–34.0)
MCHC: 34.3 g/dL (ref 30.0–36.0)
MCV: 93.7 fL (ref 80.0–100.0)
Platelets: 118 10*3/uL — ABNORMAL LOW (ref 150–400)
RBC: 2.52 MIL/uL — ABNORMAL LOW (ref 3.87–5.11)
RDW: 12.7 % (ref 11.5–15.5)
WBC: 4 10*3/uL (ref 4.0–10.5)
nRBC: 0 % (ref 0.0–0.2)

## 2020-11-04 LAB — HEPATITIS C ANTIBODY: HCV Ab: NONREACTIVE

## 2020-11-04 MED ORDER — LACTULOSE 10 GM/15ML PO SOLN
20.0000 g | Freq: Three times a day (TID) | ORAL | Status: DC
  Administered 2020-11-04 – 2020-11-08 (×10): 20 g via ORAL
  Filled 2020-11-04 (×14): qty 30

## 2020-11-04 NOTE — Plan of Care (Signed)

## 2020-11-04 NOTE — Evaluation (Signed)
Physical Therapy Evaluation Patient Details Name: Alison Price MRN: [id number] DOB: 1946-01-03 Today's Date: 11/04/2020  History of Present Illness  Alison Price is a 75 y.o. female with medical history significant for hypertension, GERD, arthritis who presents to the ER for evaluation following a fall.  Patient states that she had gone outside to use the bathroom and while walking back into the house she tripped on concrete and fell.  She denies any loss of consciousness and denied feeling dizzy or lightheaded prior to the fall.  She complained of pain in her left arm as well as her right knee and was unable to get up.  Clinical Impression  The pt presents this session with good understanding of her current deficits. She is able to tolerate standing but presents with L lateral lean requiring Mod A for safety. The pt will require skilled PT in order to educate on appropriate DME and to continue to optimize mobility. PT will continue to follow.        Recommendations for follow up therapy are one component of a multi-disciplinary discharge planning process, led by the attending physician.  Recommendations may be updated based on patient status, additional functional criteria and insurance authorization.  Follow Up Recommendations SNF    Equipment Recommendations   (platform RW)    Recommendations for Other Services       Precautions / Restrictions Precautions Precautions: Fall Required Braces or Orthoses: Knee Immobilizer - Right Knee Immobilizer - Right: Other (comment) Restrictions Weight Bearing Restrictions: Yes LUE Weight Bearing: Weight bear through elbow only RLE Weight Bearing: Weight bearing as tolerated      Mobility  Bed Mobility Overal bed mobility: Needs Assistance Bed Mobility: Supine to Sit     Supine to sit: HOB elevated;Mod assist (for LE management and verbal cues for technique.) Sit to supine: Min guard;HOB elevated   General bed mobility  comments: MOD A for managing LE    Transfers Overall transfer level: Needs assistance Equipment used: 1 person hand held assist Transfers: Sit to/from Stand Sit to Stand: Min assist;+2 physical assistance            Ambulation/Gait             General Gait Details: Trialed taking one step at bedside. Pt with difficulty with R weightshifting  Stairs            Wheelchair Mobility    Modified Rankin (Stroke Patients Only)       Balance Overall balance assessment: Needs assistance Sitting-balance support: No upper extremity supported;Feet supported Sitting balance-Leahy Scale: Fair Sitting balance - Comments: Fair sitting balance at EOB during UB dressing   Standing balance support: Single extremity supported;During functional activity Standing balance-Leahy Scale: Poor Standing balance comment: pt requires MOD A to maintain static standing balance                             Pertinent Vitals/Pain Pain Assessment: Faces Faces Pain Scale: Hurts a little bit Pain Location: L elbow Pain Descriptors / Indicators: Discomfort;Grimacing Pain Intervention(s): Monitored during session;Repositioned    Home Living Family/patient expects to be discharged to:: Skilled nursing facility Living Arrangements: Other relatives Available Help at Discharge: Family;Available 24 hours/day;Available PRN/intermittently Type of Home: House Home Access: Stairs to enter Entrance Stairs-Rails: None Entrance Stairs-Number of Steps: 2 Home Layout: One level Home Equipment: Bedside commode;Shower seat      Prior Function Level of Independence: Independent  Comments: Independent with mobility and ADLs. Pt drives. No other falls within the past 6 months     Hand Dominance   Dominant Hand: Left    Extremity/Trunk Assessment   Upper Extremity Assessment Upper Extremity Assessment: LUE deficits/detail RUE Deficits / Details: grossly at least 3/5 in all  movements LUE Deficits / Details: shoulder flexion grossly at least 3/5. Unable to fully assess elbow flex/ext d/t pain and wrist d/t immobilization LUE: Unable to fully assess due to pain;Unable to fully assess due to immobilization    Lower Extremity Assessment Lower Extremity Assessment: Generalized weakness;RLE deficits/detail RLE: Unable to fully assess due to immobilization RLE Sensation:  (Pt reports diminshed sensation of toes compared to other areas on foot during light touch screening.)       Communication   Communication: No difficulties  Cognition Arousal/Alertness: Awake/alert Behavior During Therapy: WFL for tasks assessed/performed Overall Cognitive Status: No family/caregiver present to determine baseline cognitive functioning                                 General Comments: Alert and oriented to self, place, month/year, and situation. Tangential and self-limiting at times however pleasant and agreeable with gentle encouragement. Unable to recall weightbearing precautions for LUE      General Comments General comments (skin integrity, edema, etc.): bruising around face and L elbow    Exercises Other Exercises Other Exercises: Pt educated on KI brace and positioning. She reports understanding. Other Exercises: Pt educated on technique for supine<>sit<>stand transfers.   Assessment/Plan    PT Assessment Patient needs continued PT services  PT Problem List Decreased strength;Decreased coordination;Decreased activity tolerance;Decreased mobility;Decreased knowledge of precautions;Decreased balance;Decreased knowledge of use of DME;Decreased cognition       PT Treatment Interventions Gait training;Therapeutic exercise;Stair training;Functional mobility training;Therapeutic activities    PT Goals (Current goals can be found in the Care Plan section)  Acute Rehab PT Goals Patient Stated Goal: to go to rehab PT Goal Formulation: With patient Time For  Goal Achievement: 11/18/20 Potential to Achieve Goals: Good    Frequency 7X/week   Barriers to discharge Decreased caregiver support      Co-evaluation PT/OT/SLP Co-Evaluation/Treatment: Yes Reason for Co-Treatment: For patient/therapist safety;To address functional/ADL transfers PT goals addressed during session: Mobility/safety with mobility OT goals addressed during session: ADL's and self-care       AM-PAC PT "6 Clicks" Mobility  Outcome Measure Help needed turning from your back to your side while in a flat bed without using bedrails?: A Little Help needed moving from lying on your back to sitting on the side of a flat bed without using bedrails?: A Lot Help needed moving to and from a bed to a chair (including a wheelchair)?: A Lot Help needed standing up from a chair using your arms (e.g., wheelchair or bedside chair)?: A Lot Help needed to walk in hospital room?: Total Help needed climbing 3-5 steps with a railing? : Total 6 Click Score: 11    End of Session Equipment Utilized During Treatment: Gait belt;Right knee immobilizer Activity Tolerance: Patient tolerated treatment well;Patient limited by pain Patient left: in bed;with bed alarm set Nurse Communication: Mobility status PT Visit Diagnosis: Unsteadiness on feet (R26.81);History of falling (Z91.81);Muscle weakness (generalized) (M62.81);Difficulty in walking, not elsewhere classified (R26.2)    Time: 0910-1000 PT Time Calculation (min) (ACUTE ONLY): 50 min   Charges:   PT Evaluation $PT Eval Moderate Complexity: 1 Mod PT  Treatments $Therapeutic Activity: 23-37 mins        1:26 PM, 11/04/20 Alison Price A. Mordecai Maes PT, DPT Physical Therapist - [facility]   Mosie Angus A Koby Hartfield 11/04/2020, 1:24 PM

## 2020-11-04 NOTE — Evaluation (Signed)
Occupational Therapy Evaluation Patient Details Name: Alison Price MRN: [id number] DOB: [birth year] Today's Date: 11/04/2020   History of Present Illness Alison Price is a 75 y.o. female with medical history significant for hypertension, GERD, arthritis who presents to the ER for evaluation following a fall.  Patient states that she had gone outside to use the bathroom and while walking back into the house she tripped on concrete and fell.  She denies any loss of consciousness and denied feeling dizzy or lightheaded prior to the fall.  She complained of pain in her left arm as well as her right knee and was unable to get up.   Clinical Impression   Pt seen for OT evaluation this date. Session coordinated with PT to address functional/ADL transfers. Upon arrival to room, pt awake and seated upright in bed. Pt A&Ox4 and agreeable to OT/PT evaluation. Prior to admission, pt was independent in all ADLs and functional mobility, living in a 1-story home with sister and grandson. Pt was active and driving. Pt denies any other falls within past 6 months. Pt currently requires MAX A for bed-level LB dressing, MOD A for supine>sit transfer, MIN A for seated UB dressing, MIN A+2 for sit<>stand transfers, and MOD A for static standing balance due to current functional impairments (See OT Problem List below). Pt would benefit from additional skilled OT services to maximize return to PLOF and minimize risk of future falls, injury, caregiver burden, and readmission. Upon discharge, recommend SNF.        Recommendations for follow up therapy are one component of a multi-disciplinary discharge planning process, led by the attending physician.  Recommendations may be updated based on patient status, additional functional criteria and insurance authorization.   Follow Up Recommendations  SNF    Equipment Recommendations  Other (comment) (2ww)       Precautions / Restrictions Precautions Precautions:  Fall Required Braces or Orthoses: Knee Immobilizer - Right Knee Immobilizer - Right: Other (comment) (on at all times except CPM/PT) Restrictions Weight Bearing Restrictions: Yes LUE Weight Bearing: Weight bear through elbow only RLE Weight Bearing: Weight bearing as tolerated      Mobility Bed Mobility Overal bed mobility: Needs Assistance Bed Mobility: Supine to Sit;Sit to Supine     Supine to sit: Mod assist;HOB elevated Sit to supine: Min guard;HOB elevated   General bed mobility comments: MOD A for managing LE    Transfers Overall transfer level: Needs assistance Equipment used: 1 person hand held assist Transfers: Sit to/from Stand Sit to Stand: Min assist;+2 physical assistance              Balance Overall balance assessment: Needs assistance Sitting-balance support: No upper extremity supported;Feet supported Sitting balance-Leahy Scale: Fair Sitting balance - Comments: Fair sitting balance at EOB during UB dressing   Standing balance support: Single extremity supported;During functional activity Standing balance-Leahy Scale: Poor Standing balance comment: pt requires MOD A to maintain static standing balance                           ADL either performed or assessed with clinical judgement   ADL Overall ADL's : Needs assistance/impaired                 Upper Body Dressing : Minimal assistance;Sitting Upper Body Dressing Details (indicate cue type and reason): to don/doff hospital gown. Lower Body Dressing: Maximal assistance;Bed level Lower Body Dressing Details (indicate cue type and reason): to  don/doff socks                                  Pertinent Vitals/Pain Pain Assessment: Faces Faces Pain Scale: Hurts little more Pain Location: L elbow Pain Descriptors / Indicators: Discomfort;Grimacing Pain Intervention(s): Limited activity within patient's tolerance;Monitored during session;Repositioned     Hand  Dominance Left   Extremity/Trunk Assessment Upper Extremity Assessment Upper Extremity Assessment: RUE deficits/detail;LUE deficits/detail RUE Deficits / Details: grossly at least 3/5 in all movements LUE Deficits / Details: shoulder flexion grossly at least 3/5. Unable to fully assess elbow flex/ext d/t pain and wrist d/t immobilization LUE: Unable to fully assess due to pain;Unable to fully assess due to immobilization   Lower Extremity Assessment Lower Extremity Assessment: Defer to PT evaluation       Communication Communication Communication: No difficulties   Cognition Arousal/Alertness: Awake/alert Behavior During Therapy: WFL for tasks assessed/performed Overall Cognitive Status: No family/caregiver present to determine baseline cognitive functioning                                 General Comments: Alert and oriented to self, place, month/year, and situation. Tangential and self-limiting at times however pleasant and agreeable with gentle encouragement. Unable to recall weightbearing precautions for LUE   General Comments  bruising around face and L elbow    Exercises Other Exercises Other Exercises: Education on role of OT, POC, and d/c recommendations        Home Living Family/patient expects to be discharged to:: Skilled nursing facility Living Arrangements: Other relatives (sister and grandson) Available Help at Discharge: Family;Available 24 hours/day;Available PRN/intermittently (sister home 24/7 but unable to provide physical assist. Grandson available PRN) Type of Home: House Home Access: Stairs to enter [employer] of Steps: 2   Home Layout: One level     Bathroom Shower/Tub: Tub/shower unit         Home Equipment: Bedside commode;Shower seat          Prior Functioning/Environment Level of Independence: Independent        Comments: Independent with mobility and ADLs. Pt drives. No other falls within the past 6 months         OT Problem List: Decreased strength;Decreased activity tolerance;Impaired balance (sitting and/or standing);Decreased safety awareness;Decreased knowledge of use of DME or AE;Decreased knowledge of precautions;Impaired sensation;Impaired UE functional use;Pain      OT Treatment/Interventions: Self-care/ADL training;Therapeutic exercise;DME and/or AE instruction;Therapeutic activities;Patient/family education;Balance training    OT Goals(Current goals can be found in the care plan section) Acute Rehab OT Goals Patient Stated Goal: to go to rehab OT Goal Formulation: With patient Time For Goal Achievement: 11/18/20 Potential to Achieve Goals: Good ADL Goals Pt Will Perform Grooming: with set-up;with supervision;sitting Pt Will Perform Upper Body Dressing: with set-up;with supervision Pt Will Transfer to Toilet: with min assist;stand pivot transfer;bedside commode  OT Frequency: Min 1X/week    AM-PAC OT "6 Clicks" Daily Activity     Outcome Measure Help from another person eating meals?: A Little Help from another person taking care of personal grooming?: A Little Help from another person toileting, which includes using toliet, bedpan, or urinal?: A Lot Help from another person bathing (including washing, rinsing, drying)?: A Lot Help from another person to put on and taking off regular upper body clothing?: A Little Help from another person to put on and  taking off regular lower body clothing?: A Lot 6 Click Score: 15   End of Session Equipment Utilized During Treatment: Gait belt Nurse Communication: Mobility status  Activity Tolerance: Patient tolerated treatment well Patient left: in bed;with call bell/phone within reach;with bed alarm set  OT Visit Diagnosis: Unsteadiness on feet (R26.81);History of falling (Z91.81);Pain Pain - part of body: Arm;Knee                Time: 5379-4327 OT Time Calculation (min): 38 min Charges:  OT General Charges $OT Visit: 1 Visit OT  Evaluation $OT Eval Moderate Complexity: 1 Mod OT Treatments $Self Care/Home Management : 8-22 mins  Matthew Folks, OTR/L [facility] [phone]

## 2020-11-04 NOTE — Progress Notes (Signed)
PROGRESS NOTE    Alison Price  ZOX:[id number] DOB: 04/24/1945 DOA: 11/01/2020 PCP: Jaclyn Shaggy, MD     Brief Narrative:   From admission hpi Alison Price is a 75 y.o. female with medical history significant for hypertension, GERD, arthritis who presents to the ER for evaluation following a fall.  Patient states that she had gone outside to use the bathroom and while walking back into the house she tripped on concrete and fell.  She denies any loss of consciousness and denied feeling dizzy or lightheaded prior to the fall.  She complained of pain in her left arm as well as her right knee and was unable to get up. EMS was called and patient was transported to the ER for further evaluation. She denies having any dizziness, no lightheadedness, no chest pain, no shortness of breath, no nausea, vomiting, no diaphoresis, no palpitations, no abdominal pain, no changes in her bowel habits, no urinary symptoms, no fever, no chills, no cough, no leg swelling, no orthopnea or paroxysmal nocturnal dyspnea   Assessment & Plan:   Principal Problem:   Right patella fracture Active Problems:   Essential hypertension   Gastroesophageal reflux disease without esophagitis   Left ulnar fracture   Left radial fracture   Fall   Wrist fracture   # Right patella fracture # Right tibal plateau fracture # Left wrist fracture # Constipation Ortho following. Mechanical trip and fall - WBAT in knee immobilizer - non-weightbearing left hand, WBAT as tolerated through elbow - pt/ot consulted, needs snf, bed search underway - outpt ortho f/u dr. Fayrene Fearing bowers 7-10 days (9/22-9/25) - home oxy prn, tylenol scheduled, miralax standing. Increasing lactulose  # Anemia Hgb 8.1 today from baseline 9s-10s, hgt stable from yesterday. No melena or hematochezia or other bleeding. Has moderate right knee swelling, no overt signs of hematoma - monitor  # thrombocytopenia Hx of mild thrombocytopenia in  the past, plts 130s today and stable - f/u hiv, hcv, smear  # AKI Resolved w/ fluids - stop ivf, push oral fluids - holding bp meds - monitor  # Dementia Appears to be at baseline - home donepezil  # HTN Here bp wnl today, alert - holding home lisinopril, amlodipine  # bacteriuria Equivocal symptoms. Staph aureus grown on recent culture, therapy options are limited - will monitor, would treat if overt signs uti   DVT prophylaxis: lovenox Code Status: full Family Communication: sister updated telephonically 9/18  Level of care: Med-Surg Status is: inpt  The patient will require care spanning > 2 midnights and should be moved to inpatient because: Inpatient level of care appropriate due to severity of illness  Dispo: The patient is from: Home              Anticipated d/c is to: SNF              Patient currently is medically stable for discharge   Difficult to place patient No     Consultants:  ortho  Procedures: none  Antimicrobials:  none    Subjective: This morning moderate left wrist pain and right knee pain. Has appetite. No bleeding  Objective: Vitals:   11/03/20 1959 11/03/20 2057 11/04/20 0351 11/04/20 0814  BP: (!) 111/56 (!) 111/56 136/63 140/69  Pulse: 69 70 66 62  Resp: 17 17 16 16   Temp: 98.8 F (37.1 C) 98.8 F (37.1 C) 98.1 F (36.7 C) 98.2 F (36.8 C)  TempSrc:   Oral   SpO2:  95% 95% 98% 100%  Weight:      Height:        Intake/Output Summary (Last 24 hours) at 11/04/2020 1108 Last data filed at 11/04/2020 0550 Gross per 24 hour  Intake 2016.77 ml  Output 1600 ml  Net 416.77 ml   Filed Weights   11/01/20 0019  Weight: 68 kg    Examination:  General exam: Appears calm and comfortable  Respiratory system: Clear to auscultation. Respiratory effort normal. Cardiovascular system: S1 & S2 heard, RRR. No JVD, murmurs, rubs, gallops or clicks. No pedal edema. Gastrointestinal system: Abdomen is nondistended, soft and nontender.  No organomegaly or masses felt. Normal bowel sounds heard. Central nervous system: Alert and oriented. No focal neurological deficits. Extremities: moving all 4. Right knee in immobilizer. Left forearm/wrist wrapped. warm Skin: No rashes, lesions or ulcers Psychiatry: confused, pleasant    Data Reviewed: I have personally reviewed following labs and imaging studies  CBC: Recent Labs  Lab 11/01/20 0601 11/02/20 0423 11/03/20 0507 11/04/20 0518  WBC 7.4 6.4 4.8 4.0  NEUTROABS 4.3  --   --   --   HGB 10.4* 9.2* 8.1* 8.1*  HCT 30.1* 25.8* 23.6* 23.6*  MCV 96.2 93.1 94.4 93.7  PLT 148* 137* 117* 118*   Basic Metabolic Panel: Recent Labs  Lab 11/01/20 0601 11/02/20 0423 11/03/20 0507 11/04/20 0518  NA 141 133* 132* 136  K 3.9 4.4 4.7 4.1  CL 102 98 97* 104  CO2 29 24 28 28   GLUCOSE 101* 117* 100* 106*  BUN 20 19 29* 22  CREATININE 0.80 0.93 1.54* 1.02*  CALCIUM 10.2 9.1 8.8* 8.2*   GFR: Estimated Creatinine Clearance: 51.9 mL/min (A) (by C-G formula based on SCr of 1.02 mg/dL (H)). Liver Function Tests: Recent Labs  Lab 11/01/20 0601  AST 29  ALT 16  ALKPHOS 54  BILITOT 0.5  PROT 7.9  ALBUMIN 4.5   No results for input(s): LIPASE, AMYLASE in the last 168 hours. No results for input(s): AMMONIA in the last 168 hours. Coagulation Profile: No results for input(s): INR, PROTIME in the last 168 hours. Cardiac Enzymes: No results for input(s): CKTOTAL, CKMB, CKMBINDEX, TROPONINI in the last 168 hours. BNP (last 3 results) No results for input(s): PROBNP in the last 8760 hours. HbA1C: No results for input(s): HGBA1C in the last 72 hours. CBG: No results for input(s): GLUCAP in the last 168 hours. Lipid Profile: No results for input(s): CHOL, HDL, LDLCALC, TRIG, CHOLHDL, LDLDIRECT in the last 72 hours. Thyroid Function Tests: No results for input(s): TSH, T4TOTAL, FREET4, T3FREE, THYROIDAB in the last 72 hours. Anemia Panel: No results for input(s): VITAMINB12,  FOLATE, FERRITIN, TIBC, IRON, RETICCTPCT in the last 72 hours. Urine analysis:    Component Value Date/Time   COLORURINE STRAW (A) 04/13/2020 1216   APPEARANCEUR Cloudy (A) 10/23/2020 1602   LABSPEC 1.020 04/13/2020 1216   LABSPEC 1.010 04/17/2011 2348   PHURINE 7.0 04/13/2020 1216   GLUCOSEU Negative 10/23/2020 1602   GLUCOSEU Negative 04/17/2011 2348   HGBUR NEGATIVE 04/13/2020 1216   BILIRUBINUR Negative 10/23/2020 1602   BILIRUBINUR Negative 04/17/2011 2348   KETONESUR NEGATIVE 04/13/2020 1216   PROTEINUR 1+ (A) 10/23/2020 1602   PROTEINUR NEGATIVE 04/13/2020 1216   UROBILINOGEN 0.2 06/26/2015 1204   NITRITE Negative 10/23/2020 1602   NITRITE POSITIVE (A) 04/13/2020 1216   LEUKOCYTESUR 2+ (A) 10/23/2020 1602   LEUKOCYTESUR TRACE (A) 04/13/2020 1216   LEUKOCYTESUR Negative 04/17/2011 2348   Sepsis Labs: @LABRCNTIP (procalcitonin:4,lacticidven:4)  )  Recent Results (from the past 240 hour(s))  Resp Panel by RT-PCR (Flu A&B, Covid) Nasopharyngeal Swab     Status: None   Collection Time: 11/01/20  6:34 AM   Specimen: Nasopharyngeal Swab; Nasopharyngeal(NP) swabs in vial transport medium  Result Value Ref Range Status   SARS Coronavirus 2 by RT PCR NEGATIVE NEGATIVE Final    Comment: (NOTE) SARS-CoV-2 target nucleic acids are NOT DETECTED.  The SARS-CoV-2 RNA is generally detectable in upper respiratory specimens during the acute phase of infection. The lowest concentration of SARS-CoV-2 viral copies this assay can detect is 138 copies/mL. A negative result does not preclude SARS-Cov-2 infection and should not be used as the sole basis for treatment or other patient management decisions. A negative result may occur with  improper specimen collection/handling, submission of specimen other than nasopharyngeal swab, presence of viral mutation(s) within the areas targeted by this assay, and inadequate number of viral copies(<138 copies/mL). A negative result must be combined  with clinical observations, patient history, and epidemiological information. The expected result is Negative.  Fact Sheet for Patients:  BloggerCourse.com  Fact Sheet for Healthcare Providers:  SeriousBroker.it  This test is no t yet approved or cleared by the [place] FDA and  has been authorized for detection and/or diagnosis of SARS-CoV-2 by FDA under an Emergency Use Authorization (EUA). This EUA will remain  in effect (meaning this test can be used) for the duration of the COVID-19 declaration under Section 564(b)(1) of the Act, 21 U.S.C.section 360bbb-3(b)(1), unless the authorization is terminated  or revoked sooner.       Influenza A by PCR NEGATIVE NEGATIVE Final   Influenza B by PCR NEGATIVE NEGATIVE Final    Comment: (NOTE) The Xpert Xpress SARS-CoV-2/FLU/RSV plus assay is intended as an aid in the diagnosis of influenza from Nasopharyngeal swab specimens and should not be used as a sole basis for treatment. Nasal washings and aspirates are unacceptable for Xpert Xpress SARS-CoV-2/FLU/RSV testing.  Fact Sheet for Patients: BloggerCourse.com  Fact Sheet for Healthcare Providers: SeriousBroker.it  This test is not yet approved or cleared by the [place] FDA and has been authorized for detection and/or diagnosis of SARS-CoV-2 by FDA under an Emergency Use Authorization (EUA). This EUA will remain in effect (meaning this test can be used) for the duration of the COVID-19 declaration under Section 564(b)(1) of the Act, 21 U.S.C. section 360bbb-3(b)(1), unless the authorization is terminated or revoked.  Performed at [facility], [address], [place], [place] [address]          Radiology Studies: No results found.      Scheduled Meds:  acetaminophen  650 mg Oral Q6H   cholecalciferol  5,000 Units Oral Daily   donepezil  5 mg Oral  Daily   enoxaparin (LOVENOX) injection  40 mg Subcutaneous Q24H   lactulose  10 g Oral BID   multivitamin with minerals  1 tablet Oral Daily   pantoprazole  40 mg Oral Daily   polyethylene glycol  17 g Oral Daily   senna  1 tablet Oral Daily   Continuous Infusions:  methocarbamol (ROBAXIN) IV Stopped (11/03/20 2200)     LOS: 1 day    Time spent: 20 min    Silvano Bilis, MD [facility]   If 7PM-7AM, please contact night-coverage [email or web address] Password [facility] 11/04/2020, 11:08 AM

## 2020-11-04 NOTE — Progress Notes (Signed)
   11/04/20 0701  Pain Assessment  Pain Scale 0-10  Pain Score 4  Pain Type Acute pain  Pain Location Wrist  Pain Orientation Left  Pain Descriptors / Indicators Aching;Nagging  Pain Frequency Constant  Pain Onset On-going  Patients Stated Pain Goal 3  Pain Intervention(s) Repositioned  2nd Pain Site  Pain Score 4  Pain Type Acute pain  Pain Location Knee  Pain Orientation Right  Pain Descriptors / Indicators Aching  Pain Frequency Constant  Pain Onset On-going  Patient's Stated Pain Goal 3  POSS Scale ([place] Opioid Sedation Scale)  POSS *See Group Information* 1-Acceptable,Awake and alert  Complaints & Interventions  Complains of Other (Comment) (Nausea)  Interventions Relaxation;Other (comment) (Notified MD)  Provider Notification  Provider Name/Title Shonna Chock, MD  Date Provider Notified 11/04/20  Time Provider Notified 0730  Notification Type Page  Notification Reason Other (Comment) (continous Nausea and No BM)  Provider response Other (Comment) (will address when MD sees patient.)  Date of Provider Response 11/04/20  Time of Provider Response [phone]

## 2020-11-05 LAB — CBC
HCT: 24.9 % — ABNORMAL LOW (ref 36.0–46.0)
Hemoglobin: 8.4 g/dL — ABNORMAL LOW (ref 12.0–15.0)
MCH: 31.8 pg (ref 26.0–34.0)
MCHC: 33.7 g/dL (ref 30.0–36.0)
MCV: 94.3 fL (ref 80.0–100.0)
Platelets: 131 10*3/uL — ABNORMAL LOW (ref 150–400)
RBC: 2.64 MIL/uL — ABNORMAL LOW (ref 3.87–5.11)
RDW: 12.6 % (ref 11.5–15.5)
WBC: 3.5 10*3/uL — ABNORMAL LOW (ref 4.0–10.5)
nRBC: 0 % (ref 0.0–0.2)

## 2020-11-05 LAB — BASIC METABOLIC PANEL
Anion gap: 8 (ref 5–15)
BUN: 15 mg/dL (ref 8–23)
CO2: 26 mmol/L (ref 22–32)
Calcium: 8.9 mg/dL (ref 8.9–10.3)
Chloride: 101 mmol/L (ref 98–111)
Creatinine, Ser: 0.8 mg/dL (ref 0.44–1.00)
GFR, Estimated: >60 mL/min (ref >60)
Glucose, Bld: 102 mg/dL — ABNORMAL HIGH (ref 70–99)
Potassium: 4.2 mmol/L (ref 3.5–5.1)
Sodium: 135 mmol/L (ref 135–145)

## 2020-11-05 LAB — PATHOLOGIST SMEAR REVIEW

## 2020-11-05 MED ORDER — SENNA 8.6 MG PO TABS
2.0000 | ORAL_TABLET | Freq: Every day | ORAL | Status: DC
  Administered 2020-11-06 – 2020-11-08 (×3): 17.2 mg via ORAL
  Filled 2020-11-05 (×4): qty 2

## 2020-11-05 NOTE — Progress Notes (Signed)
PROGRESS NOTE    Alison Price  ZJQ:[id number] DOB: 1945/03/14 DOA: 11/01/2020 PCP: Jaclyn Shaggy, MD     Brief Narrative:   From admission hpi Alison Price is a 75 y.o. female with medical history significant for hypertension, GERD, arthritis who presents to the ER for evaluation following a fall.  Patient states that she had gone outside to use the bathroom and while walking back into the house she tripped on concrete and fell.  She denies any loss of consciousness and denied feeling dizzy or lightheaded prior to the fall.  She complained of pain in her left arm as well as her right knee and was unable to get up. EMS was called and patient was transported to the ER for further evaluation. She denies having any dizziness, no lightheadedness, no chest pain, no shortness of breath, no nausea, vomiting, no diaphoresis, no palpitations, no abdominal pain, no changes in her bowel habits, no urinary symptoms, no fever, no chills, no cough, no leg swelling, no orthopnea or paroxysmal nocturnal dyspnea   Assessment & Plan:   Principal Problem:   Right patella fracture Active Problems:   Essential hypertension   Gastroesophageal reflux disease without esophagitis   Left ulnar fracture   Left radial fracture   Fall   Wrist fracture   # Right patella fracture # Right tibal plateau fracture # Left wrist fracture # Constipation Ortho following. Mechanical trip and fall - WBAT in knee immobilizer - non-weightbearing left hand, WBAT as tolerated through elbow - pt/ot consulted, needs snf, bed search underway - outpt ortho f/u dr. Fayrene Fearing bowers 7-10 days (9/22-9/25) - home oxy prn, tylenol scheduled, miralax standing. Increased lactulose  # Anemia Hgb 8.4 today from baseline 9s-10s, hgt stable from yesterday. No melena or hematochezia or other bleeding. Has moderate right knee swelling, no overt signs of hematoma - monitor  # thrombocytopenia Hx of mild thrombocytopenia in  the past, plts 130s today and stable. Hiv/hcv, smear unremarkable - monitor  # AKI Resolved w/ fluids, now discontinued - monitor  # Dementia Appears to be at baseline - home donepezil  # HTN Here bp wnl today, alert - holding home lisinopril, amlodipine  # bacteriuria Equivocal symptoms. Staph aureus grown on recent culture, therapy options are limited - will monitor, would treat if overt signs uti   DVT prophylaxis: lovenox Code Status: full Family Communication: sister updated telephonically 9/19  Level of care: Med-Surg Status is: inpt  The patient will require care spanning > 2 midnights and should be moved to inpatient because: Unsafe d/c plan  Dispo: The patient is from: Home              Anticipated d/c is to: SNF              Patient currently is medically stable for discharge   Difficult to place patient No     Consultants:  ortho  Procedures: none  Antimicrobials:  none    Subjective: This morning moderate left wrist pain and right knee pain. Has appetite. No bleeding  Objective: Vitals:   11/04/20 1231 11/04/20 2144 11/05/20 0500 11/05/20 0743  BP: (!) 158/79 139/66 (!) 150/72 (!) 142/68  Pulse: 72 81 73 67  Resp: 15 16 17 18   Temp: 99.1 F (37.3 C) 98 F (36.7 C) 98.9 F (37.2 C) 98.8 F (37.1 C)  TempSrc:  Oral Oral   SpO2: 100% 98%  99%  Weight:      Height:  Intake/Output Summary (Last 24 hours) at 11/05/2020 1122 Last data filed at 11/05/2020 0501 Gross per 24 hour  Intake --  Output 1250 ml  Net -1250 ml   Filed Weights   11/01/20 0019  Weight: 68 kg    Examination:  General exam: Appears calm and comfortable  Respiratory system: Clear to auscultation. Respiratory effort normal. Cardiovascular system: S1 & S2 heard, RRR. No JVD, murmurs, rubs, gallops or clicks. No pedal edema. Gastrointestinal system: Abdomen is nondistended, soft and nontender. No organomegaly or masses felt. Normal bowel sounds  heard. Central nervous system: Alert and oriented. No focal neurological deficits. Extremities: moving all 4. Right knee in immobilizer. Left forearm/wrist wrapped. warm Skin: No rashes, lesions or ulcers Psychiatry: confused, pleasant    Data Reviewed: I have personally reviewed following labs and imaging studies  CBC: Recent Labs  Lab 11/01/20 0601 11/02/20 0423 11/03/20 0507 11/04/20 0518 11/05/20 0407  WBC 7.4 6.4 4.8 4.0 3.5*  NEUTROABS 4.3  --   --   --   --   HGB 10.4* 9.2* 8.1* 8.1* 8.4*  HCT 30.1* 25.8* 23.6* 23.6* 24.9*  MCV 96.2 93.1 94.4 93.7 94.3  PLT 148* 137* 117* 118* 131*   Basic Metabolic Panel: Recent Labs  Lab 11/01/20 0601 11/02/20 0423 11/03/20 0507 11/04/20 0518 11/05/20 0407  NA 141 133* 132* 136 135  K 3.9 4.4 4.7 4.1 4.2  CL 102 98 97* 104 101  CO2 29 24 28 28 26   GLUCOSE 101* 117* 100* 106* 102*  BUN 20 19 29* 22 15  CREATININE 0.80 0.93 1.54* 1.02* 0.80  CALCIUM 10.2 9.1 8.8* 8.2* 8.9   GFR: Estimated Creatinine Clearance: 66.2 mL/min (by C-G formula based on SCr of 0.8 mg/dL). Liver Function Tests: Recent Labs  Lab 11/01/20 0601  AST 29  ALT 16  ALKPHOS 54  BILITOT 0.5  PROT 7.9  ALBUMIN 4.5   No results for input(s): LIPASE, AMYLASE in the last 168 hours. No results for input(s): AMMONIA in the last 168 hours. Coagulation Profile: No results for input(s): INR, PROTIME in the last 168 hours. Cardiac Enzymes: No results for input(s): CKTOTAL, CKMB, CKMBINDEX, TROPONINI in the last 168 hours. BNP (last 3 results) No results for input(s): PROBNP in the last 8760 hours. HbA1C: No results for input(s): HGBA1C in the last 72 hours. CBG: No results for input(s): GLUCAP in the last 168 hours. Lipid Profile: No results for input(s): CHOL, HDL, LDLCALC, TRIG, CHOLHDL, LDLDIRECT in the last 72 hours. Thyroid Function Tests: No results for input(s): TSH, T4TOTAL, FREET4, T3FREE, THYROIDAB in the last 72 hours. Anemia Panel: No  results for input(s): VITAMINB12, FOLATE, FERRITIN, TIBC, IRON, RETICCTPCT in the last 72 hours. Urine analysis:    Component Value Date/Time   COLORURINE STRAW (A) 04/13/2020 1216   APPEARANCEUR Cloudy (A) 10/23/2020 1602   LABSPEC 1.020 04/13/2020 1216   LABSPEC 1.010 04/17/2011 2348   PHURINE 7.0 04/13/2020 1216   GLUCOSEU Negative 10/23/2020 1602   GLUCOSEU Negative 04/17/2011 2348   HGBUR NEGATIVE 04/13/2020 1216   BILIRUBINUR Negative 10/23/2020 1602   BILIRUBINUR Negative 04/17/2011 2348   KETONESUR NEGATIVE 04/13/2020 1216   PROTEINUR 1+ (A) 10/23/2020 1602   PROTEINUR NEGATIVE 04/13/2020 1216   UROBILINOGEN 0.2 06/26/2015 1204   NITRITE Negative 10/23/2020 1602   NITRITE POSITIVE (A) 04/13/2020 1216   LEUKOCYTESUR 2+ (A) 10/23/2020 1602   LEUKOCYTESUR TRACE (A) 04/13/2020 1216   LEUKOCYTESUR Negative 04/17/2011 2348   Sepsis Labs: @LABRCNTIP (procalcitonin:4,lacticidven:4)  )  Recent Results (from the past 240 hour(s))  Resp Panel by RT-PCR (Flu A&B, Covid) Nasopharyngeal Swab     Status: None   Collection Time: 11/01/20  6:34 AM   Specimen: Nasopharyngeal Swab; Nasopharyngeal(NP) swabs in vial transport medium  Result Value Ref Range Status   SARS Coronavirus 2 by RT PCR NEGATIVE NEGATIVE Final    Comment: (NOTE) SARS-CoV-2 target nucleic acids are NOT DETECTED.  The SARS-CoV-2 RNA is generally detectable in upper respiratory specimens during the acute phase of infection. The lowest concentration of SARS-CoV-2 viral copies this assay can detect is 138 copies/mL. A negative result does not preclude SARS-Cov-2 infection and should not be used as the sole basis for treatment or other patient management decisions. A negative result may occur with  improper specimen collection/handling, submission of specimen other than nasopharyngeal swab, presence of viral mutation(s) within the areas targeted by this assay, and inadequate number of viral copies(<138 copies/mL). A  negative result must be combined with clinical observations, patient history, and epidemiological information. The expected result is Negative.  Fact Sheet for Patients:  BloggerCourse.com  Fact Sheet for Healthcare Providers:  SeriousBroker.it  This test is no t yet approved or cleared by the [place] FDA and  has been authorized for detection and/or diagnosis of SARS-CoV-2 by FDA under an Emergency Use Authorization (EUA). This EUA will remain  in effect (meaning this test can be used) for the duration of the COVID-19 declaration under Section 564(b)(1) of the Act, 21 U.S.C.section 360bbb-3(b)(1), unless the authorization is terminated  or revoked sooner.       Influenza A by PCR NEGATIVE NEGATIVE Final   Influenza B by PCR NEGATIVE NEGATIVE Final    Comment: (NOTE) The Xpert Xpress SARS-CoV-2/FLU/RSV plus assay is intended as an aid in the diagnosis of influenza from Nasopharyngeal swab specimens and should not be used as a sole basis for treatment. Nasal washings and aspirates are unacceptable for Xpert Xpress SARS-CoV-2/FLU/RSV testing.  Fact Sheet for Patients: BloggerCourse.com  Fact Sheet for Healthcare Providers: SeriousBroker.it  This test is not yet approved or cleared by the [place] FDA and has been authorized for detection and/or diagnosis of SARS-CoV-2 by FDA under an Emergency Use Authorization (EUA). This EUA will remain in effect (meaning this test can be used) for the duration of the COVID-19 declaration under Section 564(b)(1) of the Act, 21 U.S.C. section 360bbb-3(b)(1), unless the authorization is terminated or revoked.  Performed at [facility], [address], [place], [place] [address]          Radiology Studies: No results found.      Scheduled Meds:  acetaminophen  650 mg Oral Q6H   cholecalciferol  5,000 Units  Oral Daily   donepezil  5 mg Oral Daily   enoxaparin (LOVENOX) injection  40 mg Subcutaneous Q24H   lactulose  20 g Oral TID   multivitamin with minerals  1 tablet Oral Daily   pantoprazole  40 mg Oral Daily   polyethylene glycol  17 g Oral Daily   senna  1 tablet Oral Daily   Continuous Infusions:  methocarbamol (ROBAXIN) IV Stopped (11/03/20 2200)     LOS: 2 days    Time spent: 20 min    Silvano Bilis, MD [facility]   If 7PM-7AM, please contact night-coverage [email or web address] Password [facility] 11/05/2020, 11:22 AM

## 2020-11-05 NOTE — TOC Progression Note (Signed)
Transition of Care [facility]) - Progression Note    Patient Details  Name: Alison Price MRN: [id number] Date of Birth: December 18, 1945  Transition of [facility]) CM/SW Contact  Barrie Dunker, RN Phone Number: 11/05/2020, 9:08 AM  Clinical Narrative:     Bed search sent, FL2 completed, PASSR obtained Will review bed offers once obtained       Expected Discharge Plan and Services                                                 Social Determinants of Health (SDOH) Interventions    Readmission Risk Interventions No flowsheet data found.

## 2020-11-05 NOTE — TOC Progression Note (Signed)
Transition of Care [facility]) - Progression Note    Patient Details  Name: Alison Price MRN: [id number] Date of Birth: [birth year]  Transition of [facility]) CM/SW Krebs, RN Phone Number: 11/05/2020, 3:21 PM  Clinical Narrative:   Met with the patient and reviewed in detail the bed offers for STR SNF, we reviewed the Star Ratings with Medicare, She is not happy with the options and stated she would like to go home with home health, I explained that home health only comes a couple fo times per week and is only there approx 45 min to help with PT, she stated understanding, I asked who would be helping her daily and with ADLS, she made a phone call and is discussing with someone, I explained I would come back later to see what she would like to do,  she is agreeable         Expected Discharge Plan and Services                                                 Social Determinants of Health (SDOH) Interventions    Readmission Risk Interventions No flowsheet data found.

## 2020-11-05 NOTE — Progress Notes (Signed)
Physical Therapy Treatment Patient Details Name: ALLESSANDRA Price MRN: [id number] DOB: 1945/06/29 Today's Date: 11/05/2020   History of Present Illness Alison Price is a 75 y.o. female with medical history significant for hypertension, GERD, arthritis who presents to the ER for evaluation following a fall.  Patient states that she had gone outside to use the bathroom and while walking back into the house she tripped on concrete and fell.  She denies any loss of consciousness and denied feeling dizzy or lightheaded prior to the fall.  She complained of pain in her left arm as well as her right knee and was unable to get up.    PT Comments    Pt seen this am. Very pleasant, talkative, and agreeable to OOB activity. Pt received in bed incontinent of urine upon arrival.  L UE sling applied, R knee immobilizer already in place. Pt required ModA to transfer to EOB with HOB raised and use of side rail. Sit to stand, shuffle to bedside chair with MinA for weight shifting and balance. Pt positioned to comfort in reclined position. Nursing notified of skin breakdown on toes from arthritic joint changes, also need for call bell since pt cannot reach bed rail button while up in chair.  Great tolerance for session, 2/10 c/o L UE discomfort. Continue to recommend SNF once medically stable.    Recommendations for follow up therapy are one component of a multi-disciplinary discharge planning process, led by the attending physician.  Recommendations may be updated based on patient status, additional functional criteria and insurance authorization.  Follow Up Recommendations  SNF     Equipment Recommendations       Recommendations for Other Services       Precautions / Restrictions Precautions Precautions: Fall Required Braces or Orthoses: Knee Immobilizer - Right Knee Immobilizer - Right: On at all times Restrictions Weight Bearing Restrictions: Yes LUE Weight Bearing: Non weight bearing (has  sling for OOB) RLE Weight Bearing: Weight bearing as tolerated     Mobility  Bed Mobility Overal bed mobility: Needs Assistance Bed Mobility: Supine to Sit     Supine to sit: HOB elevated;Mod assist     General bed mobility comments:  (Increased time to reduce pain level and maintain focus on task)    Transfers Overall transfer level: Needs assistance Equipment used: 1 person hand held assist Transfers: Sit to/from Stand Sit to Stand: Min assist;From elevated surface         General transfer comment:  (Verbal cues for proper technique and safety)  Ambulation/Gait                 Stairs             Wheelchair Mobility    Modified Rankin (Stroke Patients Only)       Balance Overall balance assessment: Needs assistance Sitting-balance support: No upper extremity supported;Feet supported Sitting balance-Leahy Scale: Good                                      Cognition Arousal/Alertness: Awake/alert Behavior During Therapy: WFL for tasks assessed/performed Overall Cognitive Status: No family/caregiver present to determine baseline cognitive functioning                                 General Comments:  (Very pleasant, talkative, good tolerance for activity)  Exercises Other Exercises Other Exercises:  (Pt educated on weight bearing restrictions and current POC with good understanding)    General Comments General comments (skin integrity, edema, etc.): Pt with skin breakdown on toes from arthritic joint changes. Nursing notified      Pertinent Vitals/Pain Pain Assessment: 0-10 Pain Score: 2  Pain Location: L elbow Pain Descriptors / Indicators: Discomfort;Grimacing Pain Intervention(s): Monitored during session    Home Living                      Prior Function            PT Goals (current goals can now be found in the care plan section) Acute Rehab PT Goals Patient Stated Goal: to go to  rehab Progress towards PT goals: Progressing toward goals    Frequency    7X/week      PT Plan Current plan remains appropriate    Co-evaluation              AM-PAC PT "6 Clicks" Mobility   Outcome Measure  Help needed turning from your back to your side while in a flat bed without using bedrails?: A Little Help needed moving from lying on your back to sitting on the side of a flat bed without using bedrails?: A Lot Help needed moving to and from a bed to a chair (including a wheelchair)?: A Lot Help needed standing up from a chair using your arms (e.g., wheelchair or bedside chair)?: A Lot Help needed to walk in hospital room?: Total Help needed climbing 3-5 steps with a railing? : Total 6 Click Score: 11    End of Session Equipment Utilized During Treatment: Gait belt;Right knee immobilizer (L UE sling) Activity Tolerance: No increased pain;Patient tolerated treatment well Patient left: in chair;with call bell/phone within reach Nurse Communication: Mobility status (Need for call bell in room due to UE limitations) PT Visit Diagnosis: Unsteadiness on feet (R26.81);History of falling (Z91.81);Muscle weakness (generalized) (M62.81);Difficulty in walking, not elsewhere classified (R26.2)     Time: 4696-2952 PT Time Calculation (min) (ACUTE ONLY): 47 min  Charges:  $Therapeutic Activity: 38-52 mins                    Zadie Cleverly, PTA    Jannet Askew 11/05/2020, 9:49 AM

## 2020-11-06 NOTE — Progress Notes (Signed)
PROGRESS NOTE    Alison Price  WNI:[id number] DOB: 16-Sep-1945 DOA: 11/01/2020 PCP: Jaclyn Shaggy, MD     Brief Narrative:   From admission hpi Alison Price is a 75 y.o. female with medical history significant for hypertension, GERD, arthritis who presents to the ER for evaluation following a fall.  Patient states that she had gone outside to use the bathroom and while walking back into the house she tripped on concrete and fell.  She denies any loss of consciousness and denied feeling dizzy or lightheaded prior to the fall.  She complained of pain in her left arm as well as her right knee and was unable to get up. EMS was called and patient was transported to the ER for further evaluation. She denies having any dizziness, no lightheadedness, no chest pain, no shortness of breath, no nausea, vomiting, no diaphoresis, no palpitations, no abdominal pain, no changes in her bowel habits, no urinary symptoms, no fever, no chills, no cough, no leg swelling, no orthopnea or paroxysmal nocturnal dyspnea   Assessment & Plan:   Principal Problem:   Right patella fracture Active Problems:   Essential hypertension   Gastroesophageal reflux disease without esophagitis   Left ulnar fracture   Left radial fracture   Fall   Wrist fracture   # Right patella fracture # Right tibal plateau fracture # Left wrist fracture # Constipation Ortho following. Mechanical trip and fall - WBAT in knee immobilizer - non-weightbearing left hand, WBAT as tolerated through elbow - pt/ot consulted, needs snf, bed search underway - outpt ortho f/u dr. Fayrene Fearing bowers 7-10 days (9/22-9/25) - home oxy prn, tylenol scheduled, miralax standing. Increased lactulose  # Anemia Hgb 8.4 today from baseline 9s-10s, hgt stable from prior. No melena or hematochezia or other bleeding. Has moderate right knee swelling, no overt signs of hematoma - monitor  # thrombocytopenia Hx of mild thrombocytopenia in the  past, plts 130s today and stable. Hiv/hcv, smear unremarkable - monitor  # AKI Resolved w/ fluids, now discontinued - monitor  # Dementia Appears to be at baseline - home donepezil  # HTN Here bp wnl today, alert - holding home lisinopril, amlodipine  # bacteriuria Equivocal symptoms. Staph aureus grown on recent culture, therapy options are limited - will monitor, would treat if overt signs uti   DVT prophylaxis: lovenox Code Status: full Family Communication: sister updated telephonically 9/19  Level of care: Med-Surg Status is: inpt  The patient will require care spanning > 2 midnights and should be moved to inpatient because: Unsafe d/c plan  Dispo: The patient is from: Home              Anticipated d/c is to: SNF              Patient currently is medically stable for discharge   Difficult to place patient No     Consultants:  ortho  Procedures: none  Antimicrobials:  none    Subjective: This morning moderate left wrist pain and right knee pain. Has appetite. Mild nausea  Objective: Vitals:   11/05/20 1542 11/05/20 2026 11/06/20 0521 11/06/20 0822  BP: (!) 142/78 (!) 157/79 135/69 136/71  Pulse: 66 67 (!) 57 63  Resp: 18 16 16 16   Temp: 98 F (36.7 C) 97.7 F (36.5 C) 97.9 F (36.6 C) 98.4 F (36.9 C)  TempSrc:      SpO2: 96% 98% 99% 98%  Weight:      Height:  Intake/Output Summary (Last 24 hours) at 11/06/2020 1208 Last data filed at 11/06/2020 0647 Gross per 24 hour  Intake --  Output 1800 ml  Net -1800 ml   Filed Weights   11/01/20 0019  Weight: 68 kg    Examination:  General exam: Appears calm and comfortable  Respiratory system: Clear to auscultation. Respiratory effort normal. Cardiovascular system: S1 & S2 heard, RRR. No JVD, murmurs, rubs, gallops or clicks. No pedal edema. Gastrointestinal system: Abdomen is nondistended, soft and nontender. No organomegaly or masses felt. Normal bowel sounds heard. Central nervous  system: Alert and oriented. No focal neurological deficits. Extremities: moving all 4. Right knee in immobilizer. Left forearm/wrist wrapped. warm Skin: No rashes, lesions or ulcers Psychiatry: pleasant    Data Reviewed: I have personally reviewed following labs and imaging studies  CBC: Recent Labs  Lab 11/01/20 0601 11/02/20 0423 11/03/20 0507 11/04/20 0518 11/05/20 0407  WBC 7.4 6.4 4.8 4.0 3.5*  NEUTROABS 4.3  --   --   --   --   HGB 10.4* 9.2* 8.1* 8.1* 8.4*  HCT 30.1* 25.8* 23.6* 23.6* 24.9*  MCV 96.2 93.1 94.4 93.7 94.3  PLT 148* 137* 117* 118* 131*   Basic Metabolic Panel: Recent Labs  Lab 11/01/20 0601 11/02/20 0423 11/03/20 0507 11/04/20 0518 11/05/20 0407  NA 141 133* 132* 136 135  K 3.9 4.4 4.7 4.1 4.2  CL 102 98 97* 104 101  CO2 29 24 28 28 26   GLUCOSE 101* 117* 100* 106* 102*  BUN 20 19 29* 22 15  CREATININE 0.80 0.93 1.54* 1.02* 0.80  CALCIUM 10.2 9.1 8.8* 8.2* 8.9   GFR: Estimated Creatinine Clearance: 66.2 mL/min (by C-G formula based on SCr of 0.8 mg/dL). Liver Function Tests: Recent Labs  Lab 11/01/20 0601  AST 29  ALT 16  ALKPHOS 54  BILITOT 0.5  PROT 7.9  ALBUMIN 4.5   No results for input(s): LIPASE, AMYLASE in the last 168 hours. No results for input(s): AMMONIA in the last 168 hours. Coagulation Profile: No results for input(s): INR, PROTIME in the last 168 hours. Cardiac Enzymes: No results for input(s): CKTOTAL, CKMB, CKMBINDEX, TROPONINI in the last 168 hours. BNP (last 3 results) No results for input(s): PROBNP in the last 8760 hours. HbA1C: No results for input(s): HGBA1C in the last 72 hours. CBG: No results for input(s): GLUCAP in the last 168 hours. Lipid Profile: No results for input(s): CHOL, HDL, LDLCALC, TRIG, CHOLHDL, LDLDIRECT in the last 72 hours. Thyroid Function Tests: No results for input(s): TSH, T4TOTAL, FREET4, T3FREE, THYROIDAB in the last 72 hours. Anemia Panel: No results for input(s): VITAMINB12,  FOLATE, FERRITIN, TIBC, IRON, RETICCTPCT in the last 72 hours. Urine analysis:    Component Value Date/Time   COLORURINE STRAW (A) 04/13/2020 1216   APPEARANCEUR Cloudy (A) 10/23/2020 1602   LABSPEC 1.020 04/13/2020 1216   LABSPEC 1.010 04/17/2011 2348   PHURINE 7.0 04/13/2020 1216   GLUCOSEU Negative 10/23/2020 1602   GLUCOSEU Negative 04/17/2011 2348   HGBUR NEGATIVE 04/13/2020 1216   BILIRUBINUR Negative 10/23/2020 1602   BILIRUBINUR Negative 04/17/2011 2348   KETONESUR NEGATIVE 04/13/2020 1216   PROTEINUR 1+ (A) 10/23/2020 1602   PROTEINUR NEGATIVE 04/13/2020 1216   UROBILINOGEN 0.2 06/26/2015 1204   NITRITE Negative 10/23/2020 1602   NITRITE POSITIVE (A) 04/13/2020 1216   LEUKOCYTESUR 2+ (A) 10/23/2020 1602   LEUKOCYTESUR TRACE (A) 04/13/2020 1216   LEUKOCYTESUR Negative 04/17/2011 2348   Sepsis Labs: @LABRCNTIP (procalcitonin:4,lacticidven:4)  ) Recent  Results (from the past 240 hour(s))  Resp Panel by RT-PCR (Flu A&B, Covid) Nasopharyngeal Swab     Status: None   Collection Time: 11/01/20  6:34 AM   Specimen: Nasopharyngeal Swab; Nasopharyngeal(NP) swabs in vial transport medium  Result Value Ref Range Status   SARS Coronavirus 2 by RT PCR NEGATIVE NEGATIVE Final    Comment: (NOTE) SARS-CoV-2 target nucleic acids are NOT DETECTED.  The SARS-CoV-2 RNA is generally detectable in upper respiratory specimens during the acute phase of infection. The lowest concentration of SARS-CoV-2 viral copies this assay can detect is 138 copies/mL. A negative result does not preclude SARS-Cov-2 infection and should not be used as the sole basis for treatment or other patient management decisions. A negative result may occur with  improper specimen collection/handling, submission of specimen other than nasopharyngeal swab, presence of viral mutation(s) within the areas targeted by this assay, and inadequate number of viral copies(<138 copies/mL). A negative result must be combined  with clinical observations, patient history, and epidemiological information. The expected result is Negative.  Fact Sheet for Patients:  BloggerCourse.com  Fact Sheet for Healthcare Providers:  SeriousBroker.it  This test is no t yet approved or cleared by the [place] FDA and  has been authorized for detection and/or diagnosis of SARS-CoV-2 by FDA under an Emergency Use Authorization (EUA). This EUA will remain  in effect (meaning this test can be used) for the duration of the COVID-19 declaration under Section 564(b)(1) of the Act, 21 U.S.C.section 360bbb-3(b)(1), unless the authorization is terminated  or revoked sooner.       Influenza A by PCR NEGATIVE NEGATIVE Final   Influenza B by PCR NEGATIVE NEGATIVE Final    Comment: (NOTE) The Xpert Xpress SARS-CoV-2/FLU/RSV plus assay is intended as an aid in the diagnosis of influenza from Nasopharyngeal swab specimens and should not be used as a sole basis for treatment. Nasal washings and aspirates are unacceptable for Xpert Xpress SARS-CoV-2/FLU/RSV testing.  Fact Sheet for Patients: BloggerCourse.com  Fact Sheet for Healthcare Providers: SeriousBroker.it  This test is not yet approved or cleared by the [place] FDA and has been authorized for detection and/or diagnosis of SARS-CoV-2 by FDA under an Emergency Use Authorization (EUA). This EUA will remain in effect (meaning this test can be used) for the duration of the COVID-19 declaration under Section 564(b)(1) of the Act, 21 U.S.C. section 360bbb-3(b)(1), unless the authorization is terminated or revoked.  Performed at [facility], [address], [place], [place] [address]          Radiology Studies: No results found.      Scheduled Meds:  acetaminophen  650 mg Oral Q6H   cholecalciferol  5,000 Units Oral Daily   donepezil  5 mg Oral  Daily   enoxaparin (LOVENOX) injection  40 mg Subcutaneous Q24H   lactulose  20 g Oral TID   multivitamin with minerals  1 tablet Oral Daily   pantoprazole  40 mg Oral Daily   polyethylene glycol  17 g Oral Daily   senna  2 tablet Oral Daily   Continuous Infusions:  methocarbamol (ROBAXIN) IV Stopped (11/03/20 2200)     LOS: 3 days    Time spent: 20 min    Silvano Bilis, MD [facility]   If 7PM-7AM, please contact night-coverage [email or web address] Password [facility] 11/06/2020, 12:08 PM

## 2020-11-06 NOTE — TOC Progression Note (Signed)
Transition of Care [facility]) - Progression Note    Patient Details  Name: Alison Price MRN: [id number] Date of Birth: 25-Apr-1945  Transition of Care [facility]) CM/SW Contact  Caryn Section, RN Phone Number: 11/06/2020, 2:37 PM  Clinical Narrative:   Patient and sister chose [facility].  Verlon Au at [facility] aware.  Sister is not able to come to facility as she is currently disabled and not able to come to facility.           Expected Discharge Plan and Services                                                 Social Determinants of Health (SDOH) Interventions    Readmission Risk Interventions No flowsheet data found.

## 2020-11-06 NOTE — Progress Notes (Signed)
Physical Therapy Treatment Patient Details Name: Alison Price MRN: [id number] DOB: October 21, 1945 Today's Date: 11/06/2020   History of Present Illness Alison Price is a 75 y.o. female with medical history significant for hypertension, GERD, arthritis who presents to the ER for evaluation following a fall.  Patient states that she had gone outside to use the bathroom and while walking back into the house she tripped on concrete and fell.  She denies any loss of consciousness and denied feeling dizzy or lightheaded prior to the fall.  She complained of pain in her left arm as well as her right knee and was unable to get up.    PT Comments    Patient received in bed, agrees to PT.Seems a bit confused, unable to name where she is, asking questions about her injuries. She reports she is agreeable to rehab as she cannot get around and cannot use left hand. Patient requires min guard for bed mobility with use of bed rail. Mod assist for sit to stand and to shuffle step over to recliner. Cues for WB on R LE needed. Patient will continue to benefit from skilled PT while here to improve functional mobility, independence, safety and strength.     Recommendations for follow up therapy are one component of a multi-disciplinary discharge planning process, led by the attending physician.  Recommendations may be updated based on patient status, additional functional criteria and insurance authorization.  Follow Up Recommendations  SNF;Supervision for mobility/OOB     Equipment Recommendations  Other (comment) (TBD)    Recommendations for Other Services       Precautions / Restrictions Precautions Precautions: Fall Required Braces or Orthoses: Knee Immobilizer - Right Knee Immobilizer - Right: On at all times Restrictions Weight Bearing Restrictions: Yes LUE Weight Bearing: Non weight bearing RLE Weight Bearing: Weight bearing as tolerated     Mobility  Bed Mobility Overal bed  mobility: Needs Assistance Bed Mobility: Supine to Sit     Supine to sit: Min guard;HOB elevated          Transfers Overall transfer level: Needs assistance Equipment used: 1 person hand held assist Transfers: Sit to/from Stand Sit to Stand: Mod assist         General transfer comment: Requires cues to put right foot on floor and bear weight through it. Mod assist for balance  Ambulation/Gait Ambulation/Gait assistance: Mod assist Gait Distance (Feet): 4 Feet Assistive device: 1 person hand held assist Gait Pattern/deviations: Step-to pattern;Shuffle;Decreased step length - right;Decreased step length - left Gait velocity: decr   General Gait Details: Patient with difficulty weight bearing on R LE due to pain, requires mod assist for shuffle steps from bed to recliner.   Stairs             Wheelchair Mobility    Modified Rankin (Stroke Patients Only)       Balance Overall balance assessment: Needs assistance Sitting-balance support: Feet supported Sitting balance-Leahy Scale: Good Sitting balance - Comments: able to sit unsupported, although leaning posteriorly Postural control: Posterior lean Standing balance support: During functional activity;Single extremity supported Standing balance-Leahy Scale: Poor Standing balance comment: pt requires MOD A to maintain static standing balance                            Cognition Arousal/Alertness: Awake/alert Behavior During Therapy: WFL for tasks assessed/performed Overall Cognitive Status: No family/caregiver present to determine baseline cognitive functioning  General Comments: patient not oriented, not able to tell me where she is. Doesn't know what her injuries are.      Exercises Total Joint Exercises Ankle Circles/Pumps: AROM;Both;10 reps    General Comments        Pertinent Vitals/Pain Pain Assessment: Faces Faces Pain Scale: Hurts  little more Pain Location: R knee Pain Descriptors / Indicators: Discomfort;Grimacing;Guarding;Sore Pain Intervention(s): Limited activity within patient's tolerance;Monitored during session;Repositioned    Home Living                      Prior Function            PT Goals (current goals can now be found in the care plan section) Acute Rehab PT Goals Patient Stated Goal: to go to rehab PT Goal Formulation: With patient Time For Goal Achievement: 11/18/20 Potential to Achieve Goals: Good Progress towards PT goals: Progressing toward goals    Frequency    7X/week      PT Plan Current plan remains appropriate    Co-evaluation              AM-PAC PT "6 Clicks" Mobility   Outcome Measure  Help needed turning from your back to your side while in a flat bed without using bedrails?: A Little Help needed moving from lying on your back to sitting on the side of a flat bed without using bedrails?: A Lot Help needed moving to and from a bed to a chair (including a wheelchair)?: A Lot Help needed standing up from a chair using your arms (e.g., wheelchair or bedside chair)?: A Lot Help needed to walk in hospital room?: A Lot Help needed climbing 3-5 steps with a railing? : Total 6 Click Score: 12    End of Session Equipment Utilized During Treatment: Gait belt;Right knee immobilizer Activity Tolerance: Patient limited by pain Patient left: in chair;with bed alarm set;with nursing/sitter in room Nurse Communication: Mobility status;Weight bearing status (patient needs purewick replaced) PT Visit Diagnosis: Unsteadiness on feet (R26.81);Other abnormalities of gait and mobility (R26.89);History of falling (Z91.81);Difficulty in walking, not elsewhere classified (R26.2);Pain Pain - Right/Left: Right Pain - part of body: Knee     Time: 9449-6759 PT Time Calculation (min) (ACUTE ONLY): 20 min  Charges:  $Gait Training: 8-22 mins                     Samayra Hebel, PT, GCS 11/06/20,10:16 AM

## 2020-11-07 ENCOUNTER — Ambulatory Visit: Payer: Medicare Other | Admitting: Urology

## 2020-11-07 DIAGNOSIS — I10 Essential (primary) hypertension: Secondary | ICD-10-CM

## 2020-11-07 DIAGNOSIS — S52502A Unspecified fracture of the lower end of left radius, initial encounter for closed fracture: Secondary | ICD-10-CM

## 2020-11-07 LAB — GLUCOSE, CAPILLARY: Glucose-Capillary: 139 mg/dL — ABNORMAL HIGH (ref 70–99)

## 2020-11-07 LAB — RESP PANEL BY RT-PCR (FLU A&B, COVID) ARPGX2
Influenza A by PCR: NEGATIVE
Influenza B by PCR: NEGATIVE
SARS Coronavirus 2 by RT PCR: NEGATIVE

## 2020-11-07 MED ORDER — LISINOPRIL 20 MG PO TABS
40.0000 mg | ORAL_TABLET | Freq: Every day | ORAL | Status: DC
  Administered 2020-11-07 – 2020-11-08 (×2): 40 mg via ORAL
  Filled 2020-11-07 (×2): qty 2

## 2020-11-07 NOTE — Progress Notes (Signed)
Vitals entered maually ?

## 2020-11-07 NOTE — Progress Notes (Signed)
Occupational Therapy Treatment Patient Details Name: Alison Price MRN: [id number] DOB: 1945-11-07 Today's Date: 11/07/2020   History of present illness KHARIZMA LESNICK is a 75 y.o. female with medical history significant for hypertension, GERD, arthritis who presents to the ER for evaluation following a fall.  Patient states that she had gone outside to use the bathroom and while walking back into the house she tripped on concrete and fell.  She denies any loss of consciousness and denied feeling dizzy or lightheaded prior to the fall.  She complained of pain in her left arm as well as her right knee and was unable to get up.   OT comments  Pt seen for OT treatment on this date. Upon arrival to room, pt awake and seated upright in bed. Pt alert of oriented to self, place, and parts of situation. Pt agreeable to OT tx, however tangential and self-limiting at times, and requiring cues to attempt functional mobility prior to assist provided. Pt currently requires MIN A for bed mobility, MIN A for seated UB dressing, MAX A for sit>stand peri-care, and MOD A for stand pivot transfers d/t current functional impairments (see OT problem list below). Pt continues to benefit from skilled OT services to maximize return to PLOF and minimize risk of future falls, injury, caregiver burden, and readmission. Will continue to follow POC. Discharge recommendation remains appropriate.     Recommendations for follow up therapy are one component of a multi-disciplinary discharge planning process, led by the attending physician.  Recommendations may be updated based on patient status, additional functional criteria and insurance authorization.    Follow Up Recommendations  SNF    Equipment Recommendations  Other (comment) (defer to next venue of care)       Precautions / Restrictions Precautions Precautions: Fall Required Braces or Orthoses: Knee Immobilizer - Right Knee Immobilizer - Right: On at all  times Restrictions Weight Bearing Restrictions: Yes LUE Weight Bearing: Weight bear through elbow only RLE Weight Bearing: Weight bearing as tolerated       Mobility Bed Mobility Overal bed mobility: Needs Assistance Bed Mobility: Supine to Sit     Supine to sit: Min assist;HOB elevated     General bed mobility comments: Requires increased time and cues to maintain attention on task    Transfers Overall transfer level: Needs assistance Equipment used: 1 person hand held assist Transfers: Sit to/from [employer] Sit to Stand: Mod assist Stand pivot transfers: Mod assist       General transfer comment: Requires cues to put right foot on floor and bear weight through it. Mod assist for balance    Balance Overall balance assessment: Needs assistance Sitting-balance support: No upper extremity supported;Feet supported Sitting balance-Leahy Scale: Fair Sitting balance - Comments: able to sit unsupported, although leaning posteriorly Postural control: Posterior lean Standing balance support: During functional activity;Single extremity supported Standing balance-Leahy Scale: Poor Standing balance comment: pt requires MOD A to maintain static standing balance                           ADL either performed or assessed with clinical judgement   ADL Overall ADL's : Needs assistance/impaired     Grooming: Wash/dry hands;Supervision/safety;Set up;Sitting           Upper Body Dressing : Minimal assistance;Sitting Upper Body Dressing Details (indicate cue type and reason): to don/doff hospital gown.         Toileting- Clothing Manipulation and  Hygiene: Maximal assistance;Sit to/from stand              Cognition Arousal/Alertness: Awake/alert Behavior During Therapy: WFL for tasks assessed/performed Overall Cognitive Status: No family/caregiver present to determine baseline cognitive functioning                                  General Comments: Pt oriented to self, place, and some aspects of situation. Unable to recall weightbearing precautions for LUE. Tangential and self-limiting at times.                   Pertinent Vitals/ Pain       Pain Assessment: No/denies pain (pt reporting no pain, just "discomfort")         Frequency  Min 1X/week        Progress Toward Goals  OT Goals(current goals can now be found in the care plan section)  Progress towards OT goals: Progressing toward goals  Acute Rehab OT Goals Patient Stated Goal: to regain independence OT Goal Formulation: With patient Time For Goal Achievement: 11/18/20 Potential to Achieve Goals: Good  Plan Discharge plan remains appropriate;Frequency remains appropriate       AM-PAC OT "6 Clicks" Daily Activity     Outcome Measure   Help from another person eating meals?: A Little Help from another person taking care of personal grooming?: A Little Help from another person toileting, which includes using toliet, bedpan, or urinal?: A Lot Help from another person bathing (including washing, rinsing, drying)?: A Lot Help from another person to put on and taking off regular upper body clothing?: A Little Help from another person to put on and taking off regular lower body clothing?: A Lot 6 Click Score: 15    End of Session    OT Visit Diagnosis: Unsteadiness on feet (R26.81);History of falling (Z91.81);Pain Pain - part of body: Arm;Knee   Activity Tolerance Patient tolerated treatment well   Patient Left in chair;with call bell/phone within reach;with chair alarm set;with nursing/sitter in room   Nurse Communication Mobility status        Time: 1856-3149 OT Time Calculation (min): 26 min  Charges: OT General Charges $OT Visit: 1 Visit OT Treatments $Self Care/Home Management : 23-37 mins  Matthew Folks, OTR/L [facility] [phone]

## 2020-11-07 NOTE — TOC Progression Note (Addendum)
Transition of Care [facility]) - Progression Note    Patient Details  Name: Alison Price MRN: [id number] Date of Birth: Aug 29, 1945  [facility] CM/SW Contact  Caryn Section, RN Phone Number: 11/07/2020, 1:46 PM  Clinical Narrative:   Patient can transfer to [facility] as per Verlon Au.   Addendum 1349: SNF approved: 9/21 - 9/23, next review 9/23.  Plan [name]: [id number], reference ID: [id number].  Care Coordinator: Earlene Plater.  Fax to send updates: [phone].      Expected Discharge Plan and Services                                                 Social Determinants of Health (SDOH) Interventions    Readmission Risk Interventions No flowsheet data found.

## 2020-11-07 NOTE — Care Management Important Message (Signed)
Important Message  Patient Details  Name: Alison Price MRN: [id number] Date of Birth: 1946-01-24   Medicare Important Message Given:  Other (see comment)  I left a message for Iva Boop [phone] to call me back at her convenience so I could review the Important Message from Medicare. Will await a return call.  Alison Price 11/07/2020, 2:26 PM

## 2020-11-07 NOTE — Progress Notes (Signed)
PROGRESS NOTE  NEYSHA Price    DOB: 1945/08/11, 75 y.o.  WUJ:[id number]  PCP: Jaclyn Shaggy, MD   Code Status: Full Code   DOA: 11/01/2020   LOS: 4  Brief Narrative of Current Hospitalization  Alison Price is a 75 y.o. female with a PMH significant for HTN, GERD, arthritis. They presented from home to the ED on 11/01/2020 with fall. In the ED, it was found that they had fracture to right patella, right tibia, left wrist. They were treated with pain control, bracing.  Patient was admitted to medicine service for further workup and management of fractures as outlined in detail below.  11/07/20 -is stable and awaiting transfer to 260 was not  Assessment & Plan  Principal Problem:   Right patella fracture Active Problems:   Essential hypertension   Gastroesophageal reflux disease without esophagitis   Left ulnar fracture   Left radial fracture   Fall   Wrist fracture  Fractures of right patella, right tibial plateau, left wrist.  Patient feels well with no complaints today. -Stable for discharge with projected to SNF tomorrow -Continue to follow with PT OT -Continue Tylenol and Oxy IR as needed for pain -Follow-up with Ortho, Dr. Cassell Smiles (9/22 through 9/25)  Anemia-has been stable hemoglobin 8.4 2 days ago.  No signs of active bleeding around injuries -CBC a.m.  Thrombocytopenia-stable -Monitor on morning CBC  AKI-resolved -BMP in a.m. due to restarting lisinopril  Dementia-stable -Continue donepezil -Delirium precautions  HTN-within normal limits previously and asymptomatic while holding her blood pressure medicines.  Today readings are elevated so we will reinstate her home blood pressure medicines -Restart lisinopril  DVT prophylaxis: enoxaparin (LOVENOX) injection 40 mg Start: 11/01/20 0800  Diet:  Diet Orders (From admission, onward)     Start     Ordered   11/01/20 0757  Diet 2 gram sodium Room service appropriate? Yes; Fluid consistency: Thin   Diet effective now       Question Answer Comment  Room service appropriate? Yes   Fluid consistency: Thin      11/01/20 0757            Subjective 11/07/20    Patient reports feeling well today.  She denies any other questions or concerns.  Disposition Plan & Communication  Status is: Inpatient  Remains inpatient appropriate because:Unsafe d/c plan  Dispo: The patient is from: Home              Anticipated d/c is to: SNF              Patient currently is medically stable to d/c.   Difficult to place patient Yes    Family Communication: sister   Consults, Procedures, Significant Events  Consultants:  Ortho  Procedures/significant events:  None   Antimicrobials:  Anti-infectives (From admission, onward)    None        Objective   Vitals:   11/06/20 0521 11/06/20 0822 11/06/20 1547 11/06/20 2047  BP: 135/69 136/71 123/65 116/69  Pulse: (!) 57 63 65 70  Resp: Temp: 97.9 F (36.6 C) 98.4 F (36.9 C) 98 F (36.7 C) 98 F (36.7 C)  TempSrc:   Oral   SpO2: 99% 98% 100% 97%  Weight:      Height:        Intake/Output Summary (Last 24 hours) at 11/07/2020 0728 Last data filed at 11/06/2020 1810 Gross per 24 hour  Intake --  Output 400 ml  Net -400 ml   Filed Weights   11/01/20 0019  Weight: 68 kg    Patient BMI: Body mass index is 21.52 kg/m.   Physical Exam: General: awake, alert, NAD HEENT: atraumatic, clear conjunctiva, anicteric sclera, moist mucus membranes, hearing grossly normal Respiratorynormal respiratory effort. Cardiovascular: normal S1/S2,  RRR, no JVD, murmurs, rubs, gallops, quick capillary refill  Gastrointestinal: soft, NT, ND, no HSM felt, +bowel sounds. Nervous: A&O x2. no gross focal neurologic deficits, normal speech Extremities: moves all equally, no edema, normal tone Skin: dry, intact, normal temperature, normal color, No rashes, lesions or ulcers Psychiatry: normal mood, congruent affect, judgement and  insight appear normal  Labs   I have personally reviewed following labs and imaging studies Admission on 11/01/2020  Component Date Value Ref Range Status   Sodium 11/01/2020 141  135 - 145 mmol/L Final   Potassium 11/01/2020 3.9  3.5 - 5.1 mmol/L Final   Chloride 11/01/2020 102  98 - 111 mmol/L Final   CO2 11/01/2020 29  22 - 32 mmol/L Final   Glucose, Bld 11/01/2020 101 (A) 70 - 99 mg/dL Final   BUN 67/89/3810 20  8 - 23 mg/dL Final   Creatinine, Ser 11/01/2020 0.80  0.44 - 1.00 mg/dL Final   Calcium 17/51/0258 10.2  8.9 - 10.3 mg/dL Final   Total Protein 52/77/8242 7.9  6.5 - 8.1 g/dL Final   Albumin 35/36/1443 4.5  3.5 - 5.0 g/dL Final   AST 15/40/0867 29  15 - 41 U/L Final   ALT 11/01/2020 16  0 - 44 U/L Final   Alkaline Phosphatase 11/01/2020 54  38 - 126 U/L Final   Total Bilirubin 11/01/2020 0.5  0.3 - 1.2 mg/dL Final   GFR, Estimated 11/01/2020 >60  >60 mL/min Final   Anion gap 11/01/2020 10  5 - 15 Final   WBC 11/01/2020 7.4  4.0 - 10.5 K/uL Final   RBC 11/01/2020 3.13 (A) 3.87 - 5.11 MIL/uL Final   Hemoglobin 11/01/2020 10.4 (A) 12.0 - 15.0 g/dL Final   HCT 61/95/0932 30.1 (A) 36.0 - 46.0 % Final   MCV 11/01/2020 96.2  80.0 - 100.0 fL Final   MCH 11/01/2020 33.2  26.0 - 34.0 pg Final   MCHC 11/01/2020 34.6  30.0 - 36.0 g/dL Final   RDW 67/01/4579 13.2  11.5 - 15.5 % Final   Platelets 11/01/2020 148 (A) 150 - 400 K/uL Final   nRBC 11/01/2020 0.0  0.0 - 0.2 % Final   Neutrophils Relative % 11/01/2020 57  % Final   Neutro Abs 11/01/2020 4.3  1.7 - 7.7 K/uL Final   Lymphocytes Relative 11/01/2020 28  % Final   Lymphs Abs 11/01/2020 2.1  0.7 - 4.0 K/uL Final   Monocytes Relative 11/01/2020 8  % Final   Monocytes Absolute 11/01/2020 0.6  0.1 - 1.0 K/uL Final   Eosinophils Relative 11/01/2020 1  % Final   Eosinophils Absolute 11/01/2020 0.1  0.0 - 0.5 K/uL Final   Basophils Relative 11/01/2020 2  % Final   Basophils Absolute 11/01/2020 0.1  0.0 - 0.1 K/uL Final    Immature Granulocytes 11/01/2020 4  % Final   Abs Immature Granulocytes 11/01/2020 0.26 (A) 0.00 - 0.07 K/uL Final   SARS Coronavirus 2 by RT PCR 11/01/2020 NEGATIVE  NEGATIVE Final   Influenza A by PCR 11/01/2020 NEGATIVE  NEGATIVE Final   Influenza B by PCR 11/01/2020 NEGATIVE  NEGATIVE Final   Sodium 11/02/2020 133 (A) 135 - 145 mmol/L  Final   Potassium 11/02/2020 4.4  3.5 - 5.1 mmol/L Final   Chloride 11/02/2020 98  98 - 111 mmol/L Final   CO2 11/02/2020 24  22 - 32 mmol/L Final   Glucose, Bld 11/02/2020 117 (A) 70 - 99 mg/dL Final   BUN 16/11/9602 19  8 - 23 mg/dL Final   Creatinine, Ser 11/02/2020 0.93  0.44 - 1.00 mg/dL Final   Calcium 54/10/8117 9.1  8.9 - 10.3 mg/dL Final   GFR, Estimated 11/02/2020 >60  >60 mL/min Final   Anion gap 11/02/2020 11  5 - 15 Final   WBC 11/02/2020 6.4  4.0 - 10.5 K/uL Final   RBC 11/02/2020 2.77 (A) 3.87 - 5.11 MIL/uL Final   Hemoglobin 11/02/2020 9.2 (A) 12.0 - 15.0 g/dL Final   HCT 14/78/2956 25.8 (A) 36.0 - 46.0 % Final   MCV 11/02/2020 93.1  80.0 - 100.0 fL Final   MCH 11/02/2020 33.2  26.0 - 34.0 pg Final   MCHC 11/02/2020 35.7  30.0 - 36.0 g/dL Final   RDW 21/30/8657 12.8  11.5 - 15.5 % Final   Platelets 11/02/2020 137 (A) 150 - 400 K/uL Final   nRBC 11/02/2020 0.0  0.0 - 0.2 % Final   Sodium 11/03/2020 132 (A) 135 - 145 mmol/L Final   Potassium 11/03/2020 4.7  3.5 - 5.1 mmol/L Final   Chloride 11/03/2020 97 (A) 98 - 111 mmol/L Final   CO2 11/03/2020 28  22 - 32 mmol/L Final   Glucose, Bld 11/03/2020 100 (A) 70 - 99 mg/dL Final   BUN 84/69/6295 29 (A) 8 - 23 mg/dL Final   Creatinine, Ser 11/03/2020 1.54 (A) 0.44 - 1.00 mg/dL Final   Calcium 28/41/3244 8.8 (A) 8.9 - 10.3 mg/dL Final   GFR, Estimated 11/03/2020 35 (A) >60 mL/min Final   Anion gap 11/03/2020 7  5 - 15 Final   WBC 11/03/2020 4.8  4.0 - 10.5 K/uL Final   RBC 11/03/2020 2.50 (A) 3.87 - 5.11 MIL/uL Final   Hemoglobin 11/03/2020 8.1 (A) 12.0 - 15.0 g/dL Final   HCT 02/19/7251  23.6 (A) 36.0 - 46.0 % Final   MCV 11/03/2020 94.4  80.0 - 100.0 fL Final   MCH 11/03/2020 32.4  26.0 - 34.0 pg Final   MCHC 11/03/2020 34.3  30.0 - 36.0 g/dL Final   RDW 66/44/0347 13.0  11.5 - 15.5 % Final   Platelets 11/03/2020 117 (A) 150 - 400 K/uL Final   nRBC 11/03/2020 0.0  0.0 - 0.2 % Final   Smear Review 11/03/2020 SMEAR STAINED AND AVAILABLE FOR REVIEW   Final   Path Review 11/03/2020 Blood smear is reviewed.   Final   Sodium 11/04/2020 136  135 - 145 mmol/L Final   Potassium 11/04/2020 4.1  3.5 - 5.1 mmol/L Final   Chloride 11/04/2020 104  98 - 111 mmol/L Final   CO2 11/04/2020 28  22 - 32 mmol/L Final   Glucose, Bld 11/04/2020 106 (A) 70 - 99 mg/dL Final   BUN 42/59/5638 22  8 - 23 mg/dL Final   Creatinine, Ser 11/04/2020 1.02 (A) 0.44 - 1.00 mg/dL Final   Calcium 75/64/3329 8.2 (A) 8.9 - 10.3 mg/dL Final   GFR, Estimated 11/04/2020 58 (A) >60 mL/min Final   Anion gap 11/04/2020 4 (A) 5 - 15 Final   WBC 11/04/2020 4.0  4.0 - 10.5 K/uL Final   RBC 11/04/2020 2.52 (A) 3.87 - 5.11 MIL/uL Final   Hemoglobin 11/04/2020 8.1 (A) 12.0 -  15.0 g/dL Final   HCT 51/76/1607 23.6 (A) 36.0 - 46.0 % Final   MCV 11/04/2020 93.7  80.0 - 100.0 fL Final   MCH 11/04/2020 32.1  26.0 - 34.0 pg Final   MCHC 11/04/2020 34.3  30.0 - 36.0 g/dL Final   RDW 37/11/6267 12.7  11.5 - 15.5 % Final   Platelets 11/04/2020 118 (A) 150 - 400 K/uL Final   nRBC 11/04/2020 0.0  0.0 - 0.2 % Final   HCV Ab 11/04/2020 NON REACTIVE  NON REACTIVE Final   HIV Screen 4th Generation wRfx 11/04/2020 Non Reactive  Non Reactive Final   Sodium 11/05/2020 135  135 - 145 mmol/L Final   Potassium 11/05/2020 4.2  3.5 - 5.1 mmol/L Final   Chloride 11/05/2020 101  98 - 111 mmol/L Final   CO2 11/05/2020 26  22 - 32 mmol/L Final   Glucose, Bld 11/05/2020 102 (A) 70 - 99 mg/dL Final   BUN 48/54/6270 15  8 - 23 mg/dL Final   Creatinine, Ser 11/05/2020 0.80  0.44 - 1.00 mg/dL Final   Calcium 35/00/9381 8.9  8.9 - 10.3 mg/dL  Final   GFR, Estimated 11/05/2020 >60  >60 mL/min Final   Anion gap 11/05/2020 8  5 - 15 Final   WBC 11/05/2020 3.5 (A) 4.0 - 10.5 K/uL Final   RBC 11/05/2020 2.64 (A) 3.87 - 5.11 MIL/uL Final   Hemoglobin 11/05/2020 8.4 (A) 12.0 - 15.0 g/dL Final   HCT 82/99/3716 24.9 (A) 36.0 - 46.0 % Final   MCV 11/05/2020 94.3  80.0 - 100.0 fL Final   MCH 11/05/2020 31.8  26.0 - 34.0 pg Final   MCHC 11/05/2020 33.7  30.0 - 36.0 g/dL Final   RDW 96/78/9381 12.6  11.5 - 15.5 % Final   Platelets 11/05/2020 131 (A) 150 - 400 K/uL Final   nRBC 11/05/2020 0.0  0.0 - 0.2 % Final     Recent Results (from the past 240 hour(s))  Resp Panel by RT-PCR (Flu A&B, Covid) Nasopharyngeal Swab     Status: None   Collection Time: 11/01/20  6:34 AM   Specimen: Nasopharyngeal Swab; Nasopharyngeal(NP) swabs in vial transport medium  Result Value Ref Range Status   SARS Coronavirus 2 by RT PCR NEGATIVE NEGATIVE Final    Comment: (NOTE) SARS-CoV-2 target nucleic acids are NOT DETECTED.  The SARS-CoV-2 RNA is generally detectable in upper respiratory specimens during the acute phase of infection. The lowest concentration of SARS-CoV-2 viral copies this assay can detect is 138 copies/mL. A negative result does not preclude SARS-Cov-2 infection and should not be used as the sole basis for treatment or other patient management decisions. A negative result may occur with  improper specimen collection/handling, submission of specimen other than nasopharyngeal swab, presence of viral mutation(s) within the areas targeted by this assay, and inadequate number of viral copies(<138 copies/mL). A negative result must be combined with clinical observations, patient history, and epidemiological information. The expected result is Negative.  Fact Sheet for Patients:  BloggerCourse.com  Fact Sheet for Healthcare Providers:  SeriousBroker.it  This test is no t yet approved or  cleared by the [place] FDA and  has been authorized for detection and/or diagnosis of SARS-CoV-2 by FDA under an Emergency Use Authorization (EUA). This EUA will remain  in effect (meaning this test can be used) for the duration of the COVID-19 declaration under Section 564(b)(1) of the Act, 21 U.S.C.section 360bbb-3(b)(1), unless the authorization is terminated  or revoked sooner.  Influenza A by PCR NEGATIVE NEGATIVE Final   Influenza B by PCR NEGATIVE NEGATIVE Final    Comment: (NOTE) The Xpert Xpress SARS-CoV-2/FLU/RSV plus assay is intended as an aid in the diagnosis of influenza from Nasopharyngeal swab specimens and should not be used as a sole basis for treatment. Nasal washings and aspirates are unacceptable for Xpert Xpress SARS-CoV-2/FLU/RSV testing.  Fact Sheet for Patients: BloggerCourse.com  Fact Sheet for Healthcare Providers: SeriousBroker.it  This test is not yet approved or cleared by the [place] FDA and has been authorized for detection and/or diagnosis of SARS-CoV-2 by FDA under an Emergency Use Authorization (EUA). This EUA will remain in effect (meaning this test can be used) for the duration of the COVID-19 declaration under Section 564(b)(1) of the Act, 21 U.S.C. section 360bbb-3(b)(1), unless the authorization is terminated or revoked.  Performed at [facility], [address] [place], [place] [address]      Imaging Studies  No results found. Medications   Scheduled Meds:  acetaminophen  650 mg Oral Q6H   cholecalciferol  5,000 Units Oral Daily   donepezil  5 mg Oral Daily   enoxaparin (LOVENOX) injection  40 mg Subcutaneous Q24H   lactulose  20 g Oral TID   multivitamin with minerals  1 tablet Oral Daily   pantoprazole  40 mg Oral Daily   polyethylene glycol  17 g Oral Daily   senna  2 tablet Oral Daily   Continuous Infusions:  methocarbamol (ROBAXIN) IV  Stopped (11/03/20 2200)     LOS: 4 days   Time spent: >3min   Leeroy Bock, DO [facility] 11/07/2020, 7:28 AM   To contact the [facility] Attending or Consulting provider for this patient: Check the care team in [facility] for a) attending/consulting TRH provider listed and b) the [facility] team listed Log into [email or web address] and use [place]'s universal password to access. If you do not have the password, please contact the hospital operator. Locate the [facility] provider you are looking for under [facility] and page to a number that you can be directly reached. If you still have difficulty reaching the provider, please page the [facility] (Director on Call) for the Hospitalists listed on amion for assistance.

## 2020-11-07 NOTE — Progress Notes (Signed)
Physical Therapy Treatment Patient Details Name: Alison Price MRN: [id number] DOB: 08-03-1945 Today's Date: 11/07/2020   History of Present Illness Alison Price is a 75 y.o. female with medical history significant for hypertension, GERD, arthritis who presents to the ER for evaluation following a fall.  Patient states that she had gone outside to use the bathroom and while walking back into the house she tripped on concrete and fell.  She denies any loss of consciousness and denied feeling dizzy or lightheaded prior to the fall.  She complained of pain in her left arm as well as her right knee and was unable to get up.    PT Comments    NT reports patient requesting to get back in bed. She is more painful in recliner. Patient is confused. Requires re-orientation to her injuries and WB status/limitations each session. No carry over. Patient requires mod assist for sit to stand with +1 assist. Mod assist +1 to pivot from bed to recliner. Cues needed during mobility to bear weight as able on R LE.  She will continue to benefit from skilled PT while here to improve functional independence and safety with mobility.     Recommendations for follow up therapy are one component of a multi-disciplinary discharge planning process, led by the attending physician.  Recommendations may be updated based on patient status, additional functional criteria and insurance authorization.  Follow Up Recommendations  SNF;Supervision for mobility/OOB     Equipment Recommendations  Other (comment) (TBD next venue)    Recommendations for Other Services       Precautions / Restrictions Precautions Precautions: Fall Required Braces or Orthoses: Knee Immobilizer - Right Knee Immobilizer - Right: On at all times Restrictions Weight Bearing Restrictions: Yes LUE Weight Bearing: Non weight bearing RLE Weight Bearing: Weight bearing as tolerated Other Position/Activity Restrictions: can weight bear  through elbow only on left     Mobility  Bed Mobility Overal bed mobility: Needs Assistance Bed Mobility: Sit to Supine     Supine to sit: Min assist;HOB elevated Sit to supine: Min assist   General bed mobility comments: Min assist to bring R leg up onto bed    Transfers Overall transfer level: Needs assistance Equipment used: 1 person hand held assist Transfers: Sit to/from [employer] Sit to Stand: Mod assist Stand pivot transfers: Mod assist       General transfer comment: Requires cues to put right foot on floor and bear weight through it. Mod assist for balance  Ambulation/Gait             General Gait Details: Patient requires mod assist for standing, and pivot into bed. increased pain after sitting up in recliner. Patient requesting to get back into bed.   Stairs             Wheelchair Mobility    Modified Rankin (Stroke Patients Only)       Balance Overall balance assessment: Needs assistance Sitting-balance support: Feet supported Sitting balance-Leahy Scale: Fair Sitting balance - Comments: able to sit unsupported, needs supervision for decreased safety awareness Postural control: Posterior lean Standing balance support: Single extremity supported;During functional activity Standing balance-Leahy Scale: Poor Standing balance comment: pt requires MOD A to maintain static standing balance                            Cognition Arousal/Alertness: Awake/alert Behavior During Therapy: WFL for tasks assessed/performed Overall Cognitive Status: No family/caregiver  present to determine baseline cognitive functioning                                 General Comments: Patient is confused. No carryover regarding WB precautions or that she is able to WB on R LE. Needs repeated reminders.      Exercises      General Comments        Pertinent Vitals/Pain Pain Assessment: Faces Faces Pain Scale: Hurts  little more Pain Location: R knee Pain Descriptors / Indicators: Discomfort;Grimacing;Guarding;Sore Pain Intervention(s): Monitored during session;Limited activity within patient's tolerance    Home Living                      Prior Function            PT Goals (current goals can now be found in the care plan section) Acute Rehab PT Goals Patient Stated Goal: to regain independence PT Goal Formulation: With patient Time For Goal Achievement: 11/18/20 Potential to Achieve Goals: Good Progress towards PT goals: Progressing toward goals    Frequency    7X/week      PT Plan Current plan remains appropriate    Co-evaluation              AM-PAC PT "6 Clicks" Mobility   Outcome Measure  Help needed turning from your back to your side while in a flat bed without using bedrails?: A Little Help needed moving from lying on your back to sitting on the side of a flat bed without using bedrails?: A Lot Help needed moving to and from a bed to a chair (including a wheelchair)?: A Little Help needed standing up from a chair using your arms (e.g., wheelchair or bedside chair)?: A Lot Help needed to walk in hospital room?: A Lot Help needed climbing 3-5 steps with a railing? : Total 6 Click Score: 13    End of Session Equipment Utilized During Treatment: Gait belt;Right knee immobilizer Activity Tolerance: Patient limited by pain Patient left: in bed;with bed alarm set;with nursing/sitter in room Nurse Communication: Mobility status PT Visit Diagnosis: Unsteadiness on feet (R26.81);Other abnormalities of gait and mobility (R26.89);History of falling (Z91.81);Difficulty in walking, not elsewhere classified (R26.2);Pain Pain - Right/Left: Right Pain - part of body: Knee     Time: 6720-9470 PT Time Calculation (min) (ACUTE ONLY): 19 min  Charges:  $Therapeutic Activity: 8-22 mins                     Shaquil Aldana, PT, GCS 11/07/20,11:56 AM

## 2020-11-08 LAB — CBC
HCT: 24 % — ABNORMAL LOW (ref 36.0–46.0)
Hemoglobin: 8.1 g/dL — ABNORMAL LOW (ref 12.0–15.0)
MCH: 31.3 pg (ref 26.0–34.0)
MCHC: 33.8 g/dL (ref 30.0–36.0)
MCV: 92.7 fL (ref 80.0–100.0)
Platelets: 190 10*3/uL (ref 150–400)
RBC: 2.59 MIL/uL — ABNORMAL LOW (ref 3.87–5.11)
RDW: 12.6 % (ref 11.5–15.5)
WBC: 3.3 10*3/uL — ABNORMAL LOW (ref 4.0–10.5)
nRBC: 0 % (ref 0.0–0.2)

## 2020-11-08 LAB — BASIC METABOLIC PANEL
Anion gap: 9 (ref 5–15)
BUN: 19 mg/dL (ref 8–23)
CO2: 28 mmol/L (ref 22–32)
Calcium: 9.1 mg/dL (ref 8.9–10.3)
Chloride: 98 mmol/L (ref 98–111)
Creatinine, Ser: 1.05 mg/dL — ABNORMAL HIGH (ref 0.44–1.00)
GFR, Estimated: 56 mL/min — ABNORMAL LOW (ref >60)
Glucose, Bld: 98 mg/dL (ref 70–99)
Potassium: 4.2 mmol/L (ref 3.5–5.1)
Sodium: 135 mmol/L (ref 135–145)

## 2020-11-08 MED ORDER — ACETAMINOPHEN 325 MG PO TABS: 650.0000 mg | ORAL_TABLET | Freq: Four times a day (QID) | ORAL | 0 refills | Status: AC

## 2020-11-08 MED ORDER — SENNA 8.6 MG PO TABS: 2.0000 | ORAL_TABLET | Freq: Every day | ORAL | 0 refills | Status: DC

## 2020-11-08 MED ORDER — OXYCODONE HCL 10 MG PO TABS: 10.0000 mg | ORAL_TABLET | Freq: Three times a day (TID) | ORAL | 0 refills | Status: DC | PRN

## 2020-11-08 MED ORDER — POLYETHYLENE GLYCOL 3350 17 G PO PACK: 17.0000 g | PACK | Freq: Every day | ORAL | 0 refills | Status: DC

## 2020-11-08 MED ORDER — LISINOPRIL 40 MG PO TABS: 40.0000 mg | ORAL_TABLET | Freq: Every day | ORAL | Status: DC

## 2020-11-08 NOTE — Plan of Care (Signed)
Patient discharged per MD orders at this time.All discharge instructions,education and medications was reviewed with patient.Patient expressed understanding and will comply with dc instructions.follow up appointments was also communicated to patient.no verbal c/o or any ssx of distress.Pt was discharged to [facility] facility per order for PT/OT,rehab services.report was called to floor nurse Ms.Myrene Buddy before transport. Pt was transported by two EMS personnel on a stretcher.

## 2020-11-08 NOTE — Discharge Summary (Signed)
Physician Discharge Summary  Alison Price GDJ:[id number] DOB: 23-Nov-1945 DOA: 11/01/2020  PCP: Jaclyn Shaggy, MD  Admit date: 11/01/2020 Discharge date: 11/08/2020  Admitted From: home Disposition:  SNF  Recommendations for Outpatient Follow-up:  Follow up with PCP in 1-2 weeks Follow-up with Ortho, Dr. Cassell Smiles (9/22 through 9/25). Will need definitive cast.  Discharge Condition:stable CODE STATUS:full Diet recommendation: regular  Brief/Interim Summary: Patient had multifactorial fall at home resulting in right patella fracture, left radial/ulnar fracture, right tibial plateau fx managed non-surgically, per ortho. Patient otherwise remained stable on home medications throughout duration of hospitalization.   Discharge Diagnoses:  Principal Problem:   Right patella fracture Active Problems:   Essential hypertension   Gastroesophageal reflux disease without esophagitis   Left ulnar fracture   Left radial fracture   Fall   Wrist fracture  Allergies as of 11/08/2020       Reactions   Codeine Nausea And Vomiting   Sulfa Antibiotics Other (See Comments)   STEVEN JOHNSON SYNDROME        Medication List     STOP taking these medications    amLODipine 5 MG tablet Commonly known as: NORVASC   cefUROXime 500 MG tablet Commonly known as: CEFTIN   dimenhyDRINATE 50 MG tablet Commonly known as: DRAMAMINE   Fenofibric Acid 135 MG Cpdr   nystatin 100000 UNIT/ML suspension Commonly known as: MYCOSTATIN   pantoprazole 40 MG tablet Commonly known as: Protonix       TAKE these medications    acetaminophen 325 MG tablet Commonly known as: TYLENOL Take 2 tablets (650 mg total) by mouth every 6 (six) hours.   donepezil 5 MG tablet Commonly known as: ARICEPT Take 10 mg by mouth daily.   fexofenadine 180 MG tablet Commonly known as: ALLEGRA Take 180 mg by mouth daily.   lisinopril 40 MG tablet Commonly known as: ZESTRIL Take 1 tablet (40 mg total) by  mouth daily. What changed:  medication strength how much to take   multivitamin tablet Take 1 tablet by mouth daily.   Oxycodone HCl 10 MG Tabs Take 1 tablet (10 mg total) by mouth 3 (three) times daily as needed. What changed:  when to take this reasons to take this   polyethylene glycol 17 g packet Commonly known as: MIRALAX / GLYCOLAX Take 17 g by mouth daily.   senna 8.6 MG Tabs tablet Commonly known as: SENOKOT Take 2 tablets (17.2 mg total) by mouth daily.   Vitamin D 50 MCG (2000 UT) Caps Take 5,000 Units by mouth daily.        Allergies  Allergen Reactions   Codeine Nausea And Vomiting   Sulfa Antibiotics Other (See Comments)    Trudie Buckler SYNDROME    Consultations: ortho   Procedures/Studies: DG Elbow 2 Views Left  Result Date: 11/01/2020 CLINICAL DATA:  Mechanical fall with left elbow pain. EXAM: LEFT ELBOW - 2 VIEW COMPARISON:  None. FINDINGS: Suspicious lucency through the radial head on the frontal view, but no joint effusion. No malalignment. Generalized osteopenia. IMPRESSION: Lucency at the radial head could reflect a nondepressed fracture but there is no detectable joint effusion. Could immobilize and obtain follow-up depending on level of clinical suspicion. Electronically Signed   By: Tiburcio Pea M.D.   On: 11/01/2020 06:34   DG Wrist Complete Left  Result Date: 11/01/2020 CLINICAL DATA:  Status post fall. EXAM: LEFT WRIST - COMPLETE 3+ VIEW COMPARISON:  None. FINDINGS: An acute, comminuted, mildly displaced fracture deformity is  seen involving the distal left radius. Extension to involve the radiocarpal articulation is noted. An additional acute fracture is seen involving the distal left ulna. There is no evidence of dislocation. Marked severity degenerative changes are seen throughout the left wrist. There is mild diffuse soft tissue swelling. IMPRESSION: Acute fractures of the distal left radius and distal left ulna. Electronically Signed    By: Aram Candela M.D.   On: 11/01/2020 01:03   DG Abd 1 View  Result Date: 10/24/2020 CLINICAL DATA:  History of kidney stones, evaluate for removal of ureter stent EXAM: ABDOMEN - 1 VIEW COMPARISON:  CT abdomen and pelvis dated April 13, 2020 FINDINGS: Nonobstructive bowel gas pattern. Large calcification overlying the expected area the lower pole of the left kidney, measuring up to 2.6 cm. No calcifications seen over the expected course of the ureters. No ureter stents visualized. Levocurvature of the lumbar spine.  Interbody spacers at L4-L5. IMPRESSION: No ureter stents visualized. Large calcification the left hemiabdomen, which correlates with large stone of the lower pole of the left kidney seen on prior CT. Electronically Signed   By: Allegra Lai M.D.   On: 10/24/2020 14:22   CT HEAD WO CONTRAST ( )  Result Date: 11/01/2020 CLINICAL DATA:  Head trauma EXAM: CT HEAD WITHOUT CONTRAST CT MAXILLOFACIAL WITHOUT CONTRAST CT CERVICAL SPINE WITHOUT CONTRAST TECHNIQUE: Multidetector CT imaging of the head, cervical spine, and maxillofacial structures were performed using the standard protocol without intravenous contrast. Multiplanar CT image reconstructions of the cervical spine and maxillofacial structures were also generated. COMPARISON:  None. FINDINGS: CT HEAD FINDINGS Brain: There is no mass, hemorrhage or extra-axial collection. There is generalized atrophy without lobar predilection. There is hypoattenuation of the periventricular white matter, most commonly indicating chronic ischemic microangiopathy. Vascular: No abnormal hyperdensity of the major intracranial arteries or dural venous sinuses. No intracranial atherosclerosis. Skull: The visualized skull base, calvarium and extracranial soft tissues are normal. CT MAXILLOFACIAL FINDINGS Osseous: --Complex facial fracture types: No LeFort, zygomaticomaxillary complex or nasoorbitoethmoidal fracture. --Simple fracture types: None.  --Mandible: No fracture or dislocation. Orbits: The globes are intact. Normal appearance of the intra- and extraconal fat. Symmetric extraocular muscles and optic nerves. Sinuses: No fluid levels or advanced mucosal thickening. Soft tissues: Normal visualized extracranial soft tissues. CT CERVICAL SPINE FINDINGS Alignment: No static subluxation. Facets are aligned. Occipital condyles and the lateral masses of C1-C2 are aligned. Skull base and vertebrae: No acute fracture. Chronic compression deformities at C6 and C7. Soft tissues and spinal canal: No prevertebral fluid or swelling. No visible canal hematoma. Disc levels: Severe diffuse facet arthrosis. This is worse on the left. Upper chest: No pneumothorax, pulmonary nodule or pleural effusion. Other: Normal visualized paraspinal cervical soft tissues. IMPRESSION: 1. Chronic ischemic microangiopathy and generalized atrophy without acute intracranial abnormality. 2. No facial fracture. 3. No acute fracture or static subluxation of the cervical spine. 4. Chronic compression deformities at C6 and C7. Electronically Signed   By: Deatra Robinson M.D.   On: 11/01/2020 01:41   CT Cervical Spine Wo Contrast  Result Date: 11/01/2020 CLINICAL DATA:  Head trauma EXAM: CT HEAD WITHOUT CONTRAST CT MAXILLOFACIAL WITHOUT CONTRAST CT CERVICAL SPINE WITHOUT CONTRAST TECHNIQUE: Multidetector CT imaging of the head, cervical spine, and maxillofacial structures were performed using the standard protocol without intravenous contrast. Multiplanar CT image reconstructions of the cervical spine and maxillofacial structures were also generated. COMPARISON:  None. FINDINGS: CT HEAD FINDINGS Brain: There is no mass, hemorrhage or extra-axial collection. There is generalized  atrophy without lobar predilection. There is hypoattenuation of the periventricular white matter, most commonly indicating chronic ischemic microangiopathy. Vascular: No abnormal hyperdensity of the major intracranial  arteries or dural venous sinuses. No intracranial atherosclerosis. Skull: The visualized skull base, calvarium and extracranial soft tissues are normal. CT MAXILLOFACIAL FINDINGS Osseous: --Complex facial fracture types: No LeFort, zygomaticomaxillary complex or nasoorbitoethmoidal fracture. --Simple fracture types: None. --Mandible: No fracture or dislocation. Orbits: The globes are intact. Normal appearance of the intra- and extraconal fat. Symmetric extraocular muscles and optic nerves. Sinuses: No fluid levels or advanced mucosal thickening. Soft tissues: Normal visualized extracranial soft tissues. CT CERVICAL SPINE FINDINGS Alignment: No static subluxation. Facets are aligned. Occipital condyles and the lateral masses of C1-C2 are aligned. Skull base and vertebrae: No acute fracture. Chronic compression deformities at C6 and C7. Soft tissues and spinal canal: No prevertebral fluid or swelling. No visible canal hematoma. Disc levels: Severe diffuse facet arthrosis. This is worse on the left. Upper chest: No pneumothorax, pulmonary nodule or pleural effusion. Other: Normal visualized paraspinal cervical soft tissues. IMPRESSION: 1. Chronic ischemic microangiopathy and generalized atrophy without acute intracranial abnormality. 2. No facial fracture. 3. No acute fracture or static subluxation of the cervical spine. 4. Chronic compression deformities at C6 and C7. Electronically Signed   By: Deatra Robinson M.D.   On: 11/01/2020 01:41   CT Knee Right Wo Contrast  Result Date: 11/01/2020 CLINICAL DATA:  Right knee pain after fall. EXAM: CT OF THE RIGHT KNEE WITHOUT CONTRAST TECHNIQUE: Multidetector CT imaging of the right knee was performed according to the standard protocol. Multiplanar CT image reconstructions were also generated. COMPARISON:  Right knee x-rays from same day. FINDINGS: Bones/Joint/Cartilage Acute nondisplaced intra-articular fracture of the patella. Possible minimally depressed fracture of the  lateral tibial plateau mesial aspect with cortical buckling (series 5, image 76). No dislocation. Mild medial and lateral compartment joint space narrowing with small marginal osteophytes. Large lipohemarthrosis. Osteopenia. Ligaments Ligaments are suboptimally evaluated by CT. Muscles and Tendons Grossly intact. Soft tissue No fluid collection or hematoma.  No soft tissue mass. IMPRESSION: 1. Acute nondisplaced intra-articular fracture of the patella. 2. Possible minimally depressed fracture of the lateral tibial plateau mesial aspect. 3. Large lipohemarthrosis. Electronically Signed   By: Obie Dredge M.D.   On: 11/01/2020 08:18   DG Knee Complete 4 Views Right  Result Date: 11/01/2020 CLINICAL DATA:  Status post fall. EXAM: RIGHT KNEE - COMPLETE 4+ VIEW COMPARISON:  None. FINDINGS: Acute nondisplaced fracture of the right patella is seen. There is no evidence of dislocation. Patellofemoral compartment space narrowing is seen. Medial and lateral tibiofemoral compartment space narrowing is also noted. There is a small to moderate sized joint effusion. IMPRESSION: 1. Acute nondisplaced fracture of the right patella. 2. Small to moderate sized joint effusion. 3. Tricompartmental degenerative changes. Electronically Signed   By: Aram Candela M.D.   On: 11/01/2020 01:05   CT Maxillofacial Wo Contrast  Result Date: 11/01/2020 CLINICAL DATA:  Head trauma EXAM: CT HEAD WITHOUT CONTRAST CT MAXILLOFACIAL WITHOUT CONTRAST CT CERVICAL SPINE WITHOUT CONTRAST TECHNIQUE: Multidetector CT imaging of the head, cervical spine, and maxillofacial structures were performed using the standard protocol without intravenous contrast. Multiplanar CT image reconstructions of the cervical spine and maxillofacial structures were also generated. COMPARISON:  None. FINDINGS: CT HEAD FINDINGS Brain: There is no mass, hemorrhage or extra-axial collection. There is generalized atrophy without lobar predilection. There is  hypoattenuation of the periventricular white matter, most commonly indicating chronic ischemic microangiopathy.  Vascular: No abnormal hyperdensity of the major intracranial arteries or dural venous sinuses. No intracranial atherosclerosis. Skull: The visualized skull base, calvarium and extracranial soft tissues are normal. CT MAXILLOFACIAL FINDINGS Osseous: --Complex facial fracture types: No LeFort, zygomaticomaxillary complex or nasoorbitoethmoidal fracture. --Simple fracture types: None. --Mandible: No fracture or dislocation. Orbits: The globes are intact. Normal appearance of the intra- and extraconal fat. Symmetric extraocular muscles and optic nerves. Sinuses: No fluid levels or advanced mucosal thickening. Soft tissues: Normal visualized extracranial soft tissues. CT CERVICAL SPINE FINDINGS Alignment: No static subluxation. Facets are aligned. Occipital condyles and the lateral masses of C1-C2 are aligned. Skull base and vertebrae: No acute fracture. Chronic compression deformities at C6 and C7. Soft tissues and spinal canal: No prevertebral fluid or swelling. No visible canal hematoma. Disc levels: Severe diffuse facet arthrosis. This is worse on the left. Upper chest: No pneumothorax, pulmonary nodule or pleural effusion. Other: Normal visualized paraspinal cervical soft tissues. IMPRESSION: 1. Chronic ischemic microangiopathy and generalized atrophy without acute intracranial abnormality. 2. No facial fracture. 3. No acute fracture or static subluxation of the cervical spine. 4. Chronic compression deformities at C6 and C7. Electronically Signed   By: Deatra Robinson M.D.   On: 11/01/2020 01:41    Subjective: Patient is anxious to go home. She is willing to try rehab. Denies any current pain or concerns.   Discharge Exam: Vitals:   11/07/20 2322 11/08/20 0445  BP: 113/71 102/60  Pulse: (!) 55 60  Resp:  16  Temp: 97.7 F (36.5 C) 97.9 F (36.6 C)  SpO2: (!) 89% 97%   Vitals:   11/07/20  1618 11/07/20 1950 11/07/20 2322 11/08/20 0445  BP: (!) 148/62 120/65 113/71 102/60  Pulse: 65 60 (!) 55 60  Resp: Temp: 98.4 F (36.9 C) (!) 97.5 F (36.4 C) 97.7 F (36.5 C) 97.9 F (36.6 C)  TempSrc:  Oral Oral Oral  SpO2: 100% 97% (!) 89% 97%  Weight:      Height:        General: Pt is alert, awake, not in acute distress Cardiovascular: RRR, S1/S2  Respiratory: CTA bilaterally, no wheezing, no rhonchi Abdominal: Soft, NT, ND Extremities: no edema, no cyanosis. Moving all equally. Left wrist in brace.   Microbiology: Recent Results (from the past 240 hour(s))  Resp Panel by RT-PCR (Flu A&B, Covid) Nasopharyngeal Swab     Status: None   Collection Time: 11/01/20  6:34 AM   Specimen: Nasopharyngeal Swab; Nasopharyngeal(NP) swabs in vial transport medium  Result Value Ref Range Status   SARS Coronavirus 2 by RT PCR NEGATIVE NEGATIVE Final    Comment: (NOTE) SARS-CoV-2 target nucleic acids are NOT DETECTED.  The SARS-CoV-2 RNA is generally detectable in upper respiratory specimens during the acute phase of infection. The lowest concentration of SARS-CoV-2 viral copies this assay can detect is 138 copies/mL. A negative result does not preclude SARS-Cov-2 infection and should not be used as the sole basis for treatment or other patient management decisions. A negative result may occur with  improper specimen collection/handling, submission of specimen other than nasopharyngeal swab, presence of viral mutation(s) within the areas targeted by this assay, and inadequate number of viral copies(<138 copies/mL). A negative result must be combined with clinical observations, patient history, and epidemiological information. The expected result is Negative.  Fact Sheet for Patients:  BloggerCourse.com  Fact Sheet for Healthcare Providers:  SeriousBroker.it  This test is no t yet approved or cleared by the  [employer] secretary and  has been authorized for detection and/or diagnosis of SARS-CoV-2 by FDA under an TEFL teacher (EUA). This EUA will remain  in effect (meaning this test can be used) for the duration of the COVID-19 declaration under Section 564(b)(1) of the Act, 21 U.S.C.section 360bbb-3(b)(1), unless the authorization is terminated  or revoked sooner.       Influenza A by PCR NEGATIVE NEGATIVE Final   Influenza B by PCR NEGATIVE NEGATIVE Final    Comment: (NOTE) The Xpert Xpress SARS-CoV-2/FLU/RSV plus assay is intended as an aid in the diagnosis of influenza from Nasopharyngeal swab specimens and should not be used as a sole basis for treatment. Nasal washings and aspirates are unacceptable for Xpert Xpress SARS-CoV-2/FLU/RSV testing.  Fact Sheet for Patients: BloggerCourse.com  Fact Sheet for Healthcare Providers: SeriousBroker.it  This test is not yet approved or cleared by the [place] FDA and has been authorized for detection and/or diagnosis of SARS-CoV-2 by FDA under an Emergency Use Authorization (EUA). This EUA will remain in effect (meaning this test can be used) for the duration of the COVID-19 declaration under Section 564(b)(1) of the Act, 21 U.S.C. section 360bbb-3(b)(1), unless the authorization is terminated or revoked.  Performed at [facility], [address] [place], [place] [address]   Resp Panel by RT-PCR (Flu A&B, Covid) Nasopharyngeal Swab     Status: None   Collection Time: 11/07/20  5:01 PM   Specimen: Nasopharyngeal Swab; Nasopharyngeal(NP) swabs in vial transport medium  Result Value Ref Range Status   SARS Coronavirus 2 by RT PCR NEGATIVE NEGATIVE Final    Comment: (NOTE) SARS-CoV-2 target nucleic acids are NOT DETECTED.  The SARS-CoV-2 RNA is generally detectable in upper respiratory specimens during the acute phase of infection. The lowest concentration of SARS-CoV-2  viral copies this assay can detect is 138 copies/mL. A negative result does not preclude SARS-Cov-2 infection and should not be used as the sole basis for treatment or other patient management decisions. A negative result may occur with  improper specimen collection/handling, submission of specimen other than nasopharyngeal swab, presence of viral mutation(s) within the areas targeted by this assay, and inadequate number of viral copies(<138 copies/mL). A negative result must be combined with clinical observations, patient history, and epidemiological information. The expected result is Negative.  Fact Sheet for Patients:  BloggerCourse.com  Fact Sheet for Healthcare Providers:  SeriousBroker.it  This test is no t yet approved or cleared by the [place] FDA and  has been authorized for detection and/or diagnosis of SARS-CoV-2 by FDA under an Emergency Use Authorization (EUA). This EUA will remain  in effect (meaning this test can be used) for the duration of the COVID-19 declaration under Section 564(b)(1) of the Act, 21 U.S.C.section 360bbb-3(b)(1), unless the authorization is terminated  or revoked sooner.       Influenza A by PCR NEGATIVE NEGATIVE Final   Influenza B by PCR NEGATIVE NEGATIVE Final    Comment: (NOTE) The Xpert Xpress SARS-CoV-2/FLU/RSV plus assay is intended as an aid in the diagnosis of influenza from Nasopharyngeal swab specimens and should not be used as a sole basis for treatment. Nasal washings and aspirates are unacceptable for Xpert Xpress SARS-CoV-2/FLU/RSV testing.  Fact Sheet for Patients: BloggerCourse.com  Fact Sheet for Healthcare Providers: SeriousBroker.it  This test is not yet approved or cleared by the [place] FDA and has been authorized for detection and/or diagnosis of SARS-CoV-2 by FDA under an Emergency Use Authorization  (  EUA). This EUA will remain in effect (meaning this test can be used) for the duration of the COVID-19 declaration under Section 564(b)(1) of the Act, 21 U.S.C. section 360bbb-3(b)(1), unless the authorization is terminated or revoked.  Performed at [facility], [address] [place], [place] [address]      Labs:  Basic Metabolic Panel: Recent Labs  Lab 11/02/20 0423 11/03/20 0507 11/04/20 0518 11/05/20 0407 11/08/20 0350  NA 133* 132* 136 135 135  K 4.4 4.7 4.1 4.2 4.2  CL 98 97* 104 101 98  CO2 GLUCOSE 117* 100* 106* 102* 98  BUN 19 29* CREATININE 0.93 1.54* 1.02* 0.80 1.05*  CALCIUM 9.1 8.8* 8.2* 8.9 9.1   CBC: Recent Labs  Lab 11/02/20 0423 11/03/20 0507 11/04/20 0518 11/05/20 0407 11/08/20 0350  WBC 6.4 4.8 4.0 3.5* 3.3*  HGB 9.2* 8.1* 8.1* 8.4* 8.1*  HCT 25.8* 23.6* 23.6* 24.9* 24.0*  MCV 93.1 94.4 93.7 94.3 92.7  PLT 137* 117* 118* 131* 190   Microbiology Recent Results (from the past 240 hour(s))  Resp Panel by RT-PCR (Flu A&B, Covid) Nasopharyngeal Swab     Status: None   Collection Time: 11/01/20  6:34 AM   Specimen: Nasopharyngeal Swab; Nasopharyngeal(NP) swabs in vial transport medium  Result Value Ref Range Status   SARS Coronavirus 2 by RT PCR NEGATIVE NEGATIVE Final    Comment: (NOTE) SARS-CoV-2 target nucleic acids are NOT DETECTED.  The SARS-CoV-2 RNA is generally detectable in upper respiratory specimens during the acute phase of infection. The lowest concentration of SARS-CoV-2 viral copies this assay can detect is 138 copies/mL. A negative result does not preclude SARS-Cov-2 infection and should not be used as the sole basis for treatment or other patient management decisions. A negative result may occur with  improper specimen collection/handling, submission of specimen other than nasopharyngeal swab, presence of viral mutation(s) within the areas targeted by this assay, and inadequate number of  viral copies(<138 copies/mL). A negative result must be combined with clinical observations, patient history, and epidemiological information. The expected result is Negative.  Fact Sheet for Patients:  BloggerCourse.com  Fact Sheet for Healthcare Providers:  SeriousBroker.it  This test is no t yet approved or cleared by the [place] FDA and  has been authorized for detection and/or diagnosis of SARS-CoV-2 by FDA under an Emergency Use Authorization (EUA). This EUA will remain  in effect (meaning this test can be used) for the duration of the COVID-19 declaration under Section 564(b)(1) of the Act, 21 U.S.C.section 360bbb-3(b)(1), unless the authorization is terminated  or revoked sooner.       Influenza A by PCR NEGATIVE NEGATIVE Final   Influenza B by PCR NEGATIVE NEGATIVE Final    Comment: (NOTE) The Xpert Xpress SARS-CoV-2/FLU/RSV plus assay is intended as an aid in the diagnosis of influenza from Nasopharyngeal swab specimens and should not be used as a sole basis for treatment. Nasal washings and aspirates are unacceptable for Xpert Xpress SARS-CoV-2/FLU/RSV testing.  Fact Sheet for Patients: BloggerCourse.com  Fact Sheet for Healthcare Providers: SeriousBroker.it  This test is not yet approved or cleared by the [place] FDA and has been authorized for detection and/or diagnosis of SARS-CoV-2 by FDA under an Emergency Use Authorization (EUA). This EUA will remain in effect (meaning this test can be used) for the duration of the COVID-19 declaration under Section 564(b)(1) of the Act, 21 U.S.C. section 360bbb-3(b)(1), unless the authorization is  terminated or revoked.  Performed at [facility], [address] [place], [place] [address]   Resp Panel by RT-PCR (Flu A&B, Covid) Nasopharyngeal Swab     Status: None   Collection Time: 11/07/20  5:01  PM   Specimen: Nasopharyngeal Swab; Nasopharyngeal(NP) swabs in vial transport medium  Result Value Ref Range Status   SARS Coronavirus 2 by RT PCR NEGATIVE NEGATIVE Final    Comment: (NOTE) SARS-CoV-2 target nucleic acids are NOT DETECTED.  The SARS-CoV-2 RNA is generally detectable in upper respiratory specimens during the acute phase of infection. The lowest concentration of SARS-CoV-2 viral copies this assay can detect is 138 copies/mL. A negative result does not preclude SARS-Cov-2 infection and should not be used as the sole basis for treatment or other patient management decisions. A negative result may occur with  improper specimen collection/handling, submission of specimen other than nasopharyngeal swab, presence of viral mutation(s) within the areas targeted by this assay, and inadequate number of viral copies(<138 copies/mL). A negative result must be combined with clinical observations, patient history, and epidemiological information. The expected result is Negative.  Fact Sheet for Patients:  BloggerCourse.com  Fact Sheet for Healthcare Providers:  SeriousBroker.it  This test is no t yet approved or cleared by the [place] FDA and  has been authorized for detection and/or diagnosis of SARS-CoV-2 by FDA under an Emergency Use Authorization (EUA). This EUA will remain  in effect (meaning this test can be used) for the duration of the COVID-19 declaration under Section 564(b)(1) of the Act, 21 U.S.C.section 360bbb-3(b)(1), unless the authorization is terminated  or revoked sooner.       Influenza A by PCR NEGATIVE NEGATIVE Final   Influenza B by PCR NEGATIVE NEGATIVE Final    Comment: (NOTE) The Xpert Xpress SARS-CoV-2/FLU/RSV plus assay is intended as an aid in the diagnosis of influenza from Nasopharyngeal swab specimens and should not be used as a sole basis for treatment. Nasal washings and aspirates are  unacceptable for Xpert Xpress SARS-CoV-2/FLU/RSV testing.  Fact Sheet for Patients: BloggerCourse.com  Fact Sheet for Healthcare Providers: SeriousBroker.it  This test is not yet approved or cleared by the [place] FDA and has been authorized for detection and/or diagnosis of SARS-CoV-2 by FDA under an Emergency Use Authorization (EUA). This EUA will remain in effect (meaning this test can be used) for the duration of the COVID-19 declaration under Section 564(b)(1) of the Act, 21 U.S.C. section 360bbb-3(b)(1), unless the authorization is terminated or revoked.  Performed at [facility], [address] [place], [place] [address]      Time coordinating discharge: Over 30 minutes  Leeroy Bock, MD  [facility] 11/08/2020, 7:42 AM Pager   If 7PM-7AM, please contact night-coverage [email or web address] Password [email or web address]

## 2020-11-08 NOTE — Care Management Important Message (Signed)
Important Message  Patient Details  Name: Alison Price MRN: [id number] Date of Birth: 11-15-1945   Medicare Important Message Given:  Other (see comment)  Left a message for Wende Bushy [phone] to call me to review the Important Message from Medicare.  Will await a return call.  Olegario Messier A Margan Elias 11/08/2020, 10:05 AM

## 2020-11-08 NOTE — TOC Progression Note (Signed)
Transition of Care [facility]) - Progression Note    Patient Details  Name: Alison Price MRN: [id number] Date of Birth: 1945-04-21  Transition of Care [facility]) CM/SW Contact  Caryn Section, RN Phone Number: 11/08/2020, 9:40 AM  Clinical Narrative:   Patient to be discharged to [facility], room 404 today as per Verlon Au at facility.  Patient will be transported by [facility] at [facility] as per [employer].           Expected Discharge Plan and Services           Expected Discharge Date: 11/08/20                                     Social Determinants of Health (SDOH) Interventions    Readmission Risk Interventions No flowsheet data found.

## 2020-11-08 NOTE — Care Management Important Message (Signed)
Important Message  Patient Details  Name: Alison Price MRN: [id number] Date of Birth: 1945/12/07   Medicare Important Message Given:  Yes  Have not heard back from [facility] [employer] so I talked with the patient as she and her sister made the choice of the skilled facility. I explained, I have tried contacting her HCPOA but have not heard back from her. She said she was aware of being discharged to [facility] today and was in agreement with that plan. I wished her well and thanked her for her time.  Olegario Messier A [name] 11/08/2020, 12:11 PM

## 2020-11-21 ENCOUNTER — Encounter: Payer: Self-pay | Admitting: Urology

## 2020-11-21 ENCOUNTER — Other Ambulatory Visit: Payer: Self-pay

## 2020-11-21 ENCOUNTER — Ambulatory Visit (INDEPENDENT_AMBULATORY_CARE_PROVIDER_SITE_OTHER): Payer: Medicare Other | Admitting: Urology

## 2020-11-21 VITALS — BP 92/53 | HR 63

## 2020-11-21 DIAGNOSIS — N200 Calculus of kidney: Secondary | ICD-10-CM

## 2020-11-21 DIAGNOSIS — N390 Urinary tract infection, site not specified: Secondary | ICD-10-CM | POA: Diagnosis not present

## 2020-11-21 NOTE — Progress Notes (Signed)
   11/21/2020 3:56 PM   Alison Price 1945/09/01 [id number]  Reason for visit: Follow up nephrolithiasis, recurrent UTI  HPI: I saw Alison Price for the above issues today.  She is a comorbid and frail 75 year old female who underwent right ureteroscopy, laser lithotripsy and stent placement with me in March 2022 for a 1 cm right proximal ureteral stone.  She also has a large 1.5 cm left lower pole nonobstructive stone that has been stable, and under observation.  I personally viewed and interpreted her recent KUB that shows a stable left lower pole stone.  She has some dementia which makes a history of very challenging, and is here alone today.  She also recently had a fall and is currently in rehab and has a cast on her left arm and right leg.  She had a positive urine culture for staph on 10/24/2020 and was treated with antibiotics, really with no significant change in her baseline urinary symptoms of urgency, frequency, and itching.  Unclear if this represents true UTI versus asymptomatic bacteriuria/colonization.  We again discussed options for her large left lower pole nonobstructive stone.  She is averse to any surgery at this time which I think is very reasonable with her comorbidities and current frail state after recent fall.  If she had recurrent culture documented UTIs with symptoms, we may need to consider left ureteroscopy in the future, but observation would be preferable in the setting of her number of comorbidities.  RTC 6 months KUB prior  Sondra Come, MD  [facility] [address] [place], [place] [address] [phone]

## 2020-11-30 ENCOUNTER — Other Ambulatory Visit: Payer: Self-pay

## 2020-11-30 ENCOUNTER — Emergency Department
Admission: EM | Admit: 2020-11-30 | Discharge: 2020-12-03 | Disposition: A | Discharge status: Skilled Nursing Facility | Payer: Medicare Other | Attending: Emergency Medicine | Admitting: Emergency Medicine

## 2020-11-30 DIAGNOSIS — W19XXXD Unspecified fall, subsequent encounter: Secondary | ICD-10-CM | POA: Insufficient documentation

## 2020-11-30 DIAGNOSIS — S82141D Displaced bicondylar fracture of right tibia, subsequent encounter for closed fracture with routine healing: Secondary | ICD-10-CM

## 2020-11-30 DIAGNOSIS — Z87891 Personal history of nicotine dependence: Secondary | ICD-10-CM | POA: Diagnosis not present

## 2020-11-30 DIAGNOSIS — S82201D Unspecified fracture of shaft of right tibia, subsequent encounter for closed fracture with routine healing: Secondary | ICD-10-CM | POA: Diagnosis present

## 2020-11-30 DIAGNOSIS — Z20822 Contact with and (suspected) exposure to covid-19: Secondary | ICD-10-CM | POA: Insufficient documentation

## 2020-11-30 DIAGNOSIS — I10 Essential (primary) hypertension: Secondary | ICD-10-CM | POA: Insufficient documentation

## 2020-11-30 DIAGNOSIS — Z79899 Other long term (current) drug therapy: Secondary | ICD-10-CM | POA: Insufficient documentation

## 2020-11-30 LAB — RESP PANEL BY RT-PCR (FLU A&B, COVID) ARPGX2
Influenza A by PCR: NEGATIVE
Influenza B by PCR: NEGATIVE
SARS Coronavirus 2 by RT PCR: NEGATIVE

## 2020-11-30 MED ORDER — OXYCODONE HCL 5 MG PO TABS
10.0000 mg | ORAL_TABLET | Freq: Three times a day (TID) | ORAL | Status: DC | PRN
  Administered 2020-11-30 – 2020-12-01 (×3): 10 mg via ORAL
  Filled 2020-11-30 (×3): qty 2

## 2020-11-30 MED ORDER — POLYETHYLENE GLYCOL 3350 17 G PO PACK
17.0000 g | PACK | Freq: Every day | ORAL | Status: DC
  Administered 2020-12-01 – 2020-12-03 (×3): 17 g via ORAL
  Filled 2020-11-30 (×3): qty 1

## 2020-11-30 MED ORDER — DONEPEZIL HCL 5 MG PO TABS
10.0000 mg | ORAL_TABLET | Freq: Every day | ORAL | Status: DC
  Administered 2020-11-30 – 2020-12-03 (×4): 10 mg via ORAL
  Filled 2020-11-30 (×4): qty 2

## 2020-11-30 MED ORDER — SENNA 8.6 MG PO TABS
2.0000 | ORAL_TABLET | Freq: Every day | ORAL | Status: DC
  Administered 2020-12-01 – 2020-12-03 (×3): 17.2 mg via ORAL
  Filled 2020-11-30 (×3): qty 2

## 2020-11-30 MED ORDER — ESCITALOPRAM OXALATE 10 MG PO TABS
5.0000 mg | ORAL_TABLET | Freq: Every day | ORAL | Status: DC
  Administered 2020-11-30 – 2020-12-03 (×4): 5 mg via ORAL
  Filled 2020-11-30 (×4): qty 1

## 2020-11-30 MED ORDER — LISINOPRIL 10 MG PO TABS
40.0000 mg | ORAL_TABLET | Freq: Every day | ORAL | Status: DC
  Administered 2020-11-30 – 2020-12-02 (×3): 40 mg via ORAL
  Filled 2020-11-30 (×4): qty 4

## 2020-11-30 MED ORDER — VITAMIN D3 25 MCG (1000 UNIT) PO TABS
5000.0000 [IU] | ORAL_TABLET | Freq: Every day | ORAL | Status: DC
  Administered 2020-11-30 – 2020-12-03 (×4): 5000 [IU] via ORAL
  Filled 2020-11-30 (×6): qty 5

## 2020-11-30 NOTE — ED Notes (Signed)
Pt given meal tray.

## 2020-11-30 NOTE — TOC Initial Note (Signed)
Transition of Care [facility]) - Initial/Assessment Note    Patient Details  Name: Alison Price MRN: [id number] Date of Birth: 1946-01-17  Transition of Care [facility]) CM/SW Contact:    Joseph Art, LCSWA Phone Number: 11/30/2020, 4:14 PM  Clinical Narrative:                  Patient presents to [facility] ED from home due to family's concern to care for patient general pain from fractured left wrist.  Patient's sister Brittiney Dicostanzo [phone] or [phone], stated the patient recently discharged from [facility] after 15 days and was unable to participate in home health PT.  Ms. Mcenaney stated the patient's PCP recommended the patient come to the ED for SNF placement.  Ms. Armentor stated the patient she will be going in to surgery and will be in the hospital for about a week and the patient is unable to care for herself alone at home.  CSW explained the patient may have used all of her Medicare days and may be in co-pay days, which would require her to pay a co-pay for SNF rehab.  Ms. Ridinger stated the patient could not afford that nor could she afford a private care giver.  Ms. Astarita stated the patient does not qualify for Medicaid because she receives too much from social security and retirement pension.  CSW stated I would go ahead and send the patient's information to the different rehab facilities and find out if she has any Medicare days left w/ out a co-pay. CSW stated I would contact Ms. Dohse when I had an update.   Barriers to Discharge: Unsafe home situation   Patient Goals and CMS Choice        Expected Discharge Plan and Services                                                Prior Living Arrangements/Services                       Activities of Daily Living      Permission Sought/Granted                  Emotional Assessment              Admission diagnosis:  pain all over per EMS Patient Active Problem List    Diagnosis Date Noted   Wrist fracture 11/03/2020   Right patella fracture 11/01/2020   Left ulnar fracture 11/01/2020   Left radial fracture 11/01/2020   Fall 11/01/2020   Sepsis (HCC)    Acute pyelonephritis 04/13/2020   Obstructive uropathy 04/13/2020   Normocytic anemia 04/13/2020   Depression    Chronic pain    Protein-calorie malnutrition, severe 07/01/2019   Other chronic pain    Urinary tract infection due to extended-spectrum beta lactamase (ESBL) producing Escherichia coli    Weakness    Constipation    Abdominal pain 06/29/2019   Hypomagnesemia    Gastroesophageal reflux disease without esophagitis    Hip fx (HCC) 08/05/2017   Nausea and vomiting 10/23/2016   Hypokalemia 10/23/2016   Dehydration 10/23/2016   Essential hypertension 10/23/2016   History of total abdominal hysterectomy 06/26/2015   Pelvic pressure in female 06/26/2015   Vaginal atrophy 06/26/2015   Hematuria 06/26/2015   Dysuria 06/26/2015  Pancytopenia (HCC) 04/13/2015   PCP:  Jaclyn Shaggy, MD Pharmacy:   [facility] [address] [place] [address] Phone: [phone] Fax: [phone]  [facility] [phone] [place], [place] - [place] [address] AT [facility] [address] [place] [address] Phone: [phone] Fax: [phone]  [facility] - [place], [place] - [address] [place] [address] Phone: [phone] Fax: [phone]     Social Determinants of Health (SDOH) Interventions    Readmission Risk Interventions No flowsheet data found.

## 2020-11-30 NOTE — Evaluation (Signed)
Physical Therapy Evaluation Patient Details Name: Alison Price MRN: [id number] DOB: August 04, 1945 Today's Date: 11/30/2020  History of Present Illness  Pt is a 75 y/o F with a fall last month where she suffered L wrist fx & R knee injury (treated nonsurgically). Pt initially discharged to [facility] then to her sister's house. Pt's sister/family sent pt to ER because pt's sister may have an upcoming surgery & is unable to care for pt. PMH: anemia, arthritis, chronic pain, GERD, HTN, osteoporosis, stevens-johnson syndrome  Clinical Impression  Pt seen for PT evaluation with pt irritable throughout session. Pt reports she recently returned home following STR at [facility]; pt has sister to assist her but reports she is planning to have a hip replacement this coming week & cannot provide assistance to pt at d/c. Pt is poor historian re: PLOF at home but does endorse she used bed pads to toilet. Pt unaware of WBAT RLE (precautions taken from previous hospital admission). Pt is limited by significant pain when attempting supine>sit but declines PT assisting her. Pt ultimately unable to transfer to sitting EOB & returns supine. Pt would benefit from STR upon d/c to maximize independence with functional mobility & reduce fall risk prior to return home.        Recommendations for follow up therapy are one component of a multi-disciplinary discharge planning process, led by the attending physician.  Recommendations may be updated based on patient status, additional functional criteria and insurance authorization.  Follow Up Recommendations SNF;Supervision/Assistance - 24 hour    Equipment Recommendations  None recommended by PT    Recommendations for Other Services       Precautions / Restrictions Precautions Precautions: Fall Required Braces or Orthoses: Knee Immobilizer - Right Restrictions Weight Bearing Restrictions: Yes LUE Weight Bearing: Weight bear through elbow only RLE  Weight Bearing: Weight bearing as tolerated Other Position/Activity Restrictions: precautions taken from chart during last hospital admission      Mobility  Bed Mobility Overal bed mobility: Needs Assistance Bed Mobility: Supine to Sit;Sit to Supine     Supine to sit: Supervision;HOB elevated Sit to supine: Supervision;HOB elevated   General bed mobility comments: Pt requires MAX cuing/encouragement to attempt supine<>sit, declines PT providing assistance, endorses significant RLE pain, uses RUE to move RLE to EOB. Pt unable to fully sit at EOB, citing significant RLE pain.    Transfers                    Ambulation/Gait                Stairs            Wheelchair Mobility    Modified Rankin (Stroke Patients Only)       Balance Overall balance assessment:  (unable to formally assess as pt does not fully transfer to sitting EOB)                                           Pertinent Vitals/Pain Pain Assessment: Faces Faces Pain Scale: Hurts worst Pain Location: RLE, L wrist Pain Descriptors / Indicators: Grimacing;Guarding Pain Intervention(s): Monitored during session;Repositioned;Limited activity within patient's tolerance    Home Living Family/patient expects to be discharged to:: Private residence Living Arrangements:  (sister) Available Help at Discharge: Available PRN/intermittently Type of Home: House Home Access: Stairs to enter Entrance Stairs-Rails: None Entrance Stairs-Number of Steps:  2 steps to enter house, 1 step once in house Home Layout: One level        Prior Function Level of Independence: Needs assistance         Comments: Pt reported she recently discharged from [facility] to her house with her sister but unsure how long she had been home. Pt reports she was using bed pads for toileting, not getting OOB. Pt reports she wasn't told she could walk on RLE, nor did anyone tell her sister.     Hand  Dominance        Extremity/Trunk Assessment   Upper Extremity Assessment Upper Extremity Assessment:  (LUE forearm/wrist in cast, strength not formally tested, L wrist pain when PT moves covers off extremity)    Lower Extremity Assessment Lower Extremity Assessment:  (RLE in knee immobilizer, pt endorses pt from midthigh distally)       Communication   Communication: No difficulties  Cognition Arousal/Alertness: Awake/alert Behavior During Therapy: WFL for tasks assessed/performed (irritable) Overall Cognitive Status: No family/caregiver present to determine baseline cognitive functioning                                 General Comments: Pt AxO to self, location (after multiple attempts/questions with pt initially unable to report if she was in a hospital vs church but then pt able to report hospital in [place]), somewhat oriented to situation, not oriented to time but reports she has no reason to be concerned with the month/year      General Comments      Exercises     Assessment/Plan    PT Assessment Patient needs continued PT services  PT Problem List Decreased strength;Pain;Decreased range of motion;Decreased cognition;Decreased activity tolerance;Decreased balance;Decreased knowledge of use of DME;Decreased mobility;Decreased knowledge of precautions;Decreased safety awareness       PT Treatment Interventions DME instruction;Therapeutic exercise;Gait training;Balance training;Wheelchair mobility training;Stair training;Neuromuscular re-education;Modalities;Functional mobility training;Cognitive remediation;Patient/family education;Therapeutic activities    PT Goals (Current goals can be found in the Care Plan section)  Acute Rehab PT Goals Patient Stated Goal: decreased pain PT Goal Formulation: With patient Time For Goal Achievement: 12/14/20 Potential to Achieve Goals: Fair    Frequency Min 2X/week   Barriers to discharge Decreased caregiver  support;Inaccessible home environment      Co-evaluation               AM-PAC PT "6 Clicks" Mobility  Outcome Measure Help needed turning from your back to your side while in a flat bed without using bedrails?: A Lot Help needed moving from lying on your back to sitting on the side of a flat bed without using bedrails?: A Lot Help needed moving to and from a bed to a chair (including a wheelchair)?: Total Help needed standing up from a chair using your arms (e.g., wheelchair or bedside chair)?: Total Help needed to walk in hospital room?: Total Help needed climbing 3-5 steps with a railing? : Total 6 Click Score: 8    End of Session Equipment Utilized During Treatment: Right knee immobilizer Activity Tolerance: Patient limited by pain;Treatment limited secondary to agitation Patient left: in bed;with call bell/phone within reach Nurse Communication: Patient requests pain meds (requests to eat lunch) PT Visit Diagnosis: Muscle weakness (generalized) (M62.81);Other abnormalities of gait and mobility (R26.89);Difficulty in walking, not elsewhere classified (R26.2);Pain Pain - Right/Left: Right (L wrist, RLE) Pain - part of body: Leg    Time:  8527-7824 PT Time Calculation (min) (ACUTE ONLY): 13 min   Charges:   PT Evaluation $PT Eval Moderate Complexity: 1 Mod          Aleda Grana, PT, DPT 11/30/20, 2:05 PM   Sandi Mariscal 11/30/2020, 2:03 PM

## 2020-11-30 NOTE — ED Notes (Signed)
MD speaking with pt's sister on phone, then to pt. Bedside to discuss with pt.

## 2020-11-30 NOTE — TOC Progression Note (Signed)
Transition of Care [facility]) - Progression Note    Patient Details  Name: Alison Price MRN: [id number] Date of Birth: [birth year]  Transition of Care [facility]) CM/SW Contact  Marina Goodell Phone Number: [phone] 11/30/2020, 1:32 PM  Clinical Narrative:     Patient presents from home to [facility] ED due to family's concern to care for patient general pain from fractured left wrist.  CSW left voicemail for patient's sister [facility] [phone].    Barriers to Discharge: Unsafe home situation  Expected Discharge Plan and Services                                                 Social Determinants of Health (SDOH) Interventions    Readmission Risk Interventions No flowsheet data found.

## 2020-11-30 NOTE — NC FL2 (Signed)
[place] MEDICAID FL2 LEVEL OF CARE SCREENING TOOL     IDENTIFICATION  Patient Name: Alison Price Birthdate: 11-13-1945 Sex: female Admission Date (Current Location): 11/30/2020  [facility] and [place] Number:  Chiropodist and Address:  [facility], [address] [place], [place] [address]      Provider Number: [id number]  Attending Physician Name and Address:  Jene Every, MD  Relative Name and Phone Number:  [place], Quaranta (Sister)   [phone] [facility]) or [phone]    Current Level of Care: Hospital Recommended Level of Care: Skilled Nursing Facility Prior Approval Number:    Date Approved/Denied:   PASRR Number: Pending  Discharge Plan: SNF    Current Diagnoses: Patient Active Problem List   Diagnosis Date Noted   Wrist fracture 11/03/2020   Right patella fracture 11/01/2020   Left ulnar fracture 11/01/2020   Left radial fracture 11/01/2020   Fall 11/01/2020   Sepsis (HCC)    Acute pyelonephritis 04/13/2020   Obstructive uropathy 04/13/2020   Normocytic anemia 04/13/2020   Depression    Chronic pain    Protein-calorie malnutrition, severe 07/01/2019   Other chronic pain    Urinary tract infection due to extended-spectrum beta lactamase (ESBL) producing Escherichia coli    Weakness    Constipation    Abdominal pain 06/29/2019   Hypomagnesemia    Gastroesophageal reflux disease without esophagitis    Hip fx (HCC) 08/05/2017   Nausea and vomiting 10/23/2016   Hypokalemia 10/23/2016   Dehydration 10/23/2016   Essential hypertension 10/23/2016   History of total abdominal hysterectomy 06/26/2015   Pelvic pressure in female 06/26/2015   Vaginal atrophy 06/26/2015   Hematuria 06/26/2015   Dysuria 06/26/2015   Pancytopenia (HCC) 04/13/2015    Orientation RESPIRATION BLADDER Height & Weight     Self, Time, Situation, Place  Normal Incontinent Weight: 136 lb (61.7 kg) Height:  5\' 10"  (177.8 cm)   BEHAVIORAL SYMPTOMS/MOOD NEUROLOGICAL BOWEL NUTRITION STATUS      Incontinent Diet  AMBULATORY STATUS COMMUNICATION OF NEEDS Skin   Limited Assist Verbally Normal                       Personal Care Assistance Level of Assistance  Bathing, Feeding, Dressing, Total care Bathing Assistance: Limited assistance Feeding assistance: Independent Dressing Assistance: Limited assistance Total Care Assistance: Limited assistance   Functional Limitations Info  Sight, Speech, Hearing Sight Info: Adequate Hearing Info: Adequate Speech Info: Adequate    SPECIAL CARE FACTORS FREQUENCY  PT (By licensed PT), OT (By licensed OT)     PT Frequency: 5X per week OT Frequency: 5X per week            Contractures Contractures Info: Not present    Additional Factors Info          Pfizer COVID-19 Vaccine 03/28/2019           Current Medications (11/30/2020):  This is the current hospital active medication list Current Facility-Administered Medications  Medication Dose Route Frequency Provider Last Rate Last Admin   cholecalciferol (VITAMIN D) tablet 5,000 Units  5,000 Units Oral Daily 12/02/2020, MD   5,000 Units at 11/30/20 1411   donepezil (ARICEPT) tablet 10 mg  10 mg Oral Daily 12/02/20, MD   10 mg at 11/30/20 1418   escitalopram (LEXAPRO) tablet 5 mg  5 mg Oral Daily 12/02/20, MD   5 mg at 11/30/20 1413   lisinopril (ZESTRIL) tablet 40 mg  40 mg Oral Daily Jene Every, MD   40 mg at 11/30/20 1414   oxyCODONE (Oxy IR/ROXICODONE) immediate release tablet 10 mg  10 mg Oral TID PRN Jene Every, MD   10 mg at 11/30/20 1418   polyethylene glycol (MIRALAX / GLYCOLAX) packet 17 g  17 g Oral Daily Jene Every, MD       senna (SENOKOT) tablet 17.2 mg  2 tablet Oral Daily Jene Every, MD       Current Outpatient Medications  Medication Sig Dispense Refill   donepezil (ARICEPT) 10 MG tablet Take 10 mg by mouth daily.     lisinopril (ZESTRIL) 40 MG tablet Take  1 tablet (40 mg total) by mouth daily. 30 tablet    MACROBID 100 MG capsule Take 100 mg by mouth 2 (two) times daily.     Melatonin 10 MG TABS Take 10 mg by mouth at bedtime.     ondansetron (ZOFRAN) 4 MG tablet Take 4 mg by mouth every 8 (eight) hours as needed for nausea or vomiting.     Oxycodone HCl 10 MG TABS Take 1 tablet (10 mg total) by mouth 3 (three) times daily as needed.  0   acetaminophen (TYLENOL) 325 MG tablet Take 2 tablets (650 mg total) by mouth every 6 (six) hours. 15 tablet 0   butalbital-acetaminophen-caffeine (FIORICET) 50-325-40 MG tablet butalbital-acetaminophen-caffeine 50 mg-325 mg-40 mg tablet  TAKE 1 TO 2 TABLETS BY MOUTH EVERY 8 HOURS AS NEEDED FOR HEADACHE     cefUROXime (CEFTIN) 500 MG tablet cefuroxime axetil 500 mg tablet (Patient not taking: No sig reported)     Cholecalciferol (VITAMIN D) 2000 units CAPS Take 5,000 Units by mouth daily. (Patient not taking: Reported on 11/30/2020)     escitalopram (LEXAPRO) 5 MG tablet escitalopram 5 mg tablet  TAKE 1 TABLET BY MOUTH EVERY DAY (Patient not taking: No sig reported)     fexofenadine (ALLEGRA) 180 MG tablet Take 180 mg by mouth daily.     morphine (MS CONTIN) 15 MG 12 hr tablet morphine ER 15 mg tablet,extended release  TAKE 1 TABLET BY MOUTH THREE TIMES DAILY (Patient not taking: No sig reported)     morphine (MS CONTIN) 30 MG 12 hr tablet morphine ER 30 mg tablet,extended release  TAKE 1 TABLET BY MOUTH TWICE DAILY (Patient not taking: No sig reported)     Multiple Vitamin (MULTIVITAMIN) tablet Take 1 tablet by mouth daily. (Patient not taking: Reported on 11/30/2020)     polyethylene glycol (MIRALAX / GLYCOLAX) 17 g packet Take 17 g by mouth daily. 14 each 0   senna (SENOKOT) 8.6 MG TABS tablet Take 2 tablets (17.2 mg total) by mouth daily. 120 tablet 0     Discharge Medications: Please see discharge summary for a list of discharge medications.  Relevant Imaging Results:  Relevant Lab  Results:   Additional Information SS# [id number]  Joseph Art, LCSWA

## 2020-11-30 NOTE — ED Triage Notes (Signed)
C/o generalized pain r/t previous fractured left wrist, arm and knee. Pt. Was sent by PCP from home for inability to manage pain, and family's concern about being able to care for pt at home.

## 2020-11-30 NOTE — ED Notes (Signed)
ED Provider at bedside. 

## 2020-11-30 NOTE — ED Provider Notes (Signed)
[facility] Emergency Department Provider Note   ____________________________________________    I have reviewed the triage vital signs and the nursing notes.   HISTORY  Chief Complaint Fractures     HPI Alison Price is a 75 y.o. female with fall last month for which she suffered fractured left wrist and arm as well as right knee (treated nonsurgically).  She reports she was at [employer] during her initial recovery and then went home and is being cared for by her sister.  She is nonweightbearing on her right knee and therefore needs significant care.  Apparently her family sent her to the emergency department today because her sister may have a surgery coming up and is unable to care for her.  Patient has no complaints, she reports gradually improving pain from her fractures  Past Medical History:  Diagnosis Date   Anemia    Arthritis    Chronic pain    Complication of anesthesia    Dyspnea    GERD (gastroesophageal reflux disease)    History of kidney stones    Hypertension    Osteoporosis    Pneumonia    PONV (postoperative nausea and vomiting)    Stevens-Johnson syndrome [facility])     Patient Active Problem List   Diagnosis Date Noted   Wrist fracture 11/03/2020   Right patella fracture 11/01/2020   Left ulnar fracture 11/01/2020   Left radial fracture 11/01/2020   Fall 11/01/2020   Sepsis (HCC)    Acute pyelonephritis 04/13/2020   Obstructive uropathy 04/13/2020   Normocytic anemia 04/13/2020   Depression    Chronic pain    Protein-calorie malnutrition, severe 07/01/2019   Other chronic pain    Urinary tract infection due to extended-spectrum beta lactamase (ESBL) producing Escherichia coli    Weakness    Constipation    Abdominal pain 06/29/2019   Hypomagnesemia    Gastroesophageal reflux disease without esophagitis    Hip fx (HCC) 08/05/2017   Nausea and vomiting 10/23/2016   Hypokalemia 10/23/2016   Dehydration  10/23/2016   Essential hypertension 10/23/2016   History of total abdominal hysterectomy 06/26/2015   Pelvic pressure in female 06/26/2015   Vaginal atrophy 06/26/2015   Hematuria 06/26/2015   Dysuria 06/26/2015   Pancytopenia (HCC) 04/13/2015    Past Surgical History:  Procedure Laterality Date   ABDOMINAL HYSTERECTOMY     partial   BACK SURGERY     CYSTOSCOPY WITH STENT PLACEMENT Right 04/13/2020   Procedure: CYSTOSCOPY WITH STENT PLACEMENT;  Surgeon: Sondra Come, MD;  Location: [facility] ORS;  Service: Urology;  Laterality: Right;   CYSTOSCOPY/URETEROSCOPY/HOLMIUM LASER/STENT PLACEMENT Right 04/27/2020   Procedure: CYSTOSCOPY/URETEROSCOPY/HOLMIUM LASER/STENT EXCHANGE;  Surgeon: Sondra Come, MD;  Location: [facility] ORS;  Service: Urology;  Laterality: Right;   ESOPHAGOGASTRODUODENOSCOPY N/A 06/30/2019   Procedure: ESOPHAGOGASTRODUODENOSCOPY (EGD);  Surgeon: Toledo, Boykin Nearing, MD;  Location: [facility] ENDOSCOPY;  Service: Gastroenterology;  Laterality: N/A;   FOOT SURGERY     FRACTURE SURGERY     left hip repair   HAND SURGERY     THUMB REPAIR   HEMORRHOID SURGERY     INTRAMEDULLARY (IM) NAIL INTERTROCHANTERIC Left 08/06/2017   Procedure: INTRAMEDULLARY (IM) NAIL INTERTROCHANTRIC;  Surgeon: Signa Kell, MD;  Location: [facility] ORS;  Service: Orthopedics;  Laterality: Left;   POSTERIOR REPAIR      Prior to Admission medications   Medication Sig Start Date End Date Taking? Authorizing Provider  acetaminophen (TYLENOL) 325 MG tablet Take 2 tablets (  650 mg total) by mouth every 6 (six) hours. 11/08/20   Leeroy Bock, MD  butalbital-acetaminophen-caffeine (FIORICET) 50-325-40 MG tablet butalbital-acetaminophen-caffeine 50 mg-325 mg-40 mg tablet  TAKE 1 TO 2 TABLETS BY MOUTH EVERY 8 HOURS AS NEEDED FOR HEADACHE    [provider]  cefUROXime (CEFTIN) 500 MG tablet cefuroxime axetil 500 mg tablet    [provider]  Cholecalciferol (VITAMIN D) 2000 units CAPS Take 5,000  Units by mouth daily.    [provider]  donepezil (ARICEPT) 5 MG tablet Take 10 mg by mouth daily. 10/08/20   [provider]  escitalopram (LEXAPRO) 5 MG tablet escitalopram 5 mg tablet  TAKE 1 TABLET BY MOUTH EVERY DAY    [provider]  fexofenadine (ALLEGRA) 180 MG tablet Take 180 mg by mouth daily.    [provider]  lisinopril (ZESTRIL) 40 MG tablet Take 1 tablet (40 mg total) by mouth daily. 11/08/20   Leeroy Bock, MD  morphine (MS CONTIN) 15 MG 12 hr tablet morphine ER 15 mg tablet,extended release  TAKE 1 TABLET BY MOUTH THREE TIMES DAILY    [provider]  morphine (MS CONTIN) 30 MG 12 hr tablet morphine ER 30 mg tablet,extended release  TAKE 1 TABLET BY MOUTH TWICE DAILY    [provider]  Multiple Vitamin (MULTIVITAMIN) tablet Take 1 tablet by mouth daily.    [provider]  Oxycodone HCl 10 MG TABS Take 1 tablet (10 mg total) by mouth 3 (three) times daily as needed. 11/08/20   Leeroy Bock, MD  polyethylene glycol (MIRALAX / GLYCOLAX) 17 g packet Take 17 g by mouth daily. 11/08/20   Leeroy Bock, MD  senna (SENOKOT) 8.6 MG TABS tablet Take 2 tablets (17.2 mg total) by mouth daily. 11/08/20   Leeroy Bock, MD     Allergies Codeine and Sulfa antibiotics  Family History  Problem Relation Age of Onset   Diabetes Paternal Grandmother    Uterine cancer Maternal Aunt    Stomach cancer Maternal Aunt    Alcohol abuse Father    Breast cancer Neg Hx    Ovarian cancer Neg Hx    Colon cancer Neg Hx    Heart disease Neg Hx     Social History Social History   Tobacco Use   Smoking status: Former    Types: Cigarettes    Quit date: 02/27/1986    Years since quitting: 34.7   Smokeless tobacco: Never  Vaping Use   Vaping Use: Never used  Substance Use Topics   Alcohol use: No   Drug use: No    Review of Systems  Constitutional: No fever/chills Eyes: No visual changes.  ENT: No  sore throat. Cardiovascular: Denies chest pain. Respiratory: Denies shortness of breath. Gastrointestinal: No abdominal pain.  No nausea, no vomiting.   Genitourinary: Negative for dysuria. Musculoskeletal: Painful left wrist and right knee Skin: Negative for rash. Neurological: Negative for headaches or weakness   ____________________________________________   PHYSICAL EXAM:  VITAL SIGNS: ED Triage Vitals  Enc Vitals Group     BP 11/30/20 1133 117/68     Pulse Rate 11/30/20 1133 78     Resp 11/30/20 1133 18     Temp 11/30/20 1133 98.3 F (36.8 C)     Temp Source 11/30/20 1133 Oral     SpO2 11/30/20 1133 100 %     Weight 11/30/20 1135 61.7 kg (136 lb)     Height 11/30/20 1135  1.778 m (5\' 10" )     Head Circumference --      Peak Flow --      Pain Score 11/30/20 1134 10     Pain Loc --      Pain Edu? --      Excl. in GC? --     Constitutional: Alert and oriented. No acute distress. Pleasant and interactive Eyes: Conjunctivae are normal.  Head: Atraumatic. Nose: No congestion/rhinnorhea.  Cardiovascular: Normal rate, regular rhythm. Respiratory: Normal respiratory effort.  No retractions. Lungs CTAB. Gastrointestinal: Soft and nontender. No distention.  No CVA tenderness. Genitourinary: deferred Musculoskeletal: Right knee splinted, left arm cast, warm and well-perfused distally Neurologic:  Normal speech and language. No gross focal neurologic deficits are appreciated.  Skin:  Skin is warm, dry and intact. No rash noted. Psychiatric: Mood and affect are normal. Speech and behavior are normal.  ____________________________________________   LABS (all labs ordered are listed, but only abnormal results are displayed)  Labs Reviewed  RESP PANEL BY RT-PCR (FLU A&B, COVID) [id number]    ____________________________________________  EKG  None ____________________________________________  RADIOLOGY   ____________________________________________   PROCEDURES  Procedure(s) performed: No  Procedures   Critical Care performed: No ____________________________________________   INITIAL IMPRESSION / ASSESSMENT AND PLAN / ED COURSE  Pertinent labs & imaging results that were available during my care of the patient were reviewed by me and considered in my medical decision making (see chart for details).   Patient well-appearing and in no acute distress, vital signs and exam here unremarkable, she feels well and states that she would not be here if her family had not sent her in.  She does admit though that she is unable to care for herself as she is nonweightbearing and need significant assistance  I have consulted social work  Patient does not want to stay but does not appear that there is another acceptable alternative, I discussed with the patient's sister who cannot take care of the patient at home  PT has been consulted, home medications ordered    ____________________________________________   FINAL CLINICAL IMPRESSION(S) / ED DIAGNOSES  Final diagnoses:  Closed fracture of right tibial plateau with routine healing, subsequent encounter        Note:  This document was prepared using Dragon voice recognition software and may include unintentional dictation errors.    12/02/20, MD 11/30/20 [phone]

## 2020-11-30 NOTE — ED Notes (Signed)
Pt. Given ice and grape juice.

## 2020-11-30 NOTE — ED Notes (Signed)
Attempted to call PT for eval at both primary phone number and ascom, unable to reach. Will continue to attempt to contact.

## 2020-12-01 MED ORDER — OXYCODONE HCL 5 MG PO TABS
5.0000 mg | ORAL_TABLET | Freq: Three times a day (TID) | ORAL | Status: DC | PRN
Start: 2020-12-01 — End: 2020-12-03
  Administered 2020-12-02 – 2020-12-03 (×2): 5 mg via ORAL
  Filled 2020-12-01 (×2): qty 1

## 2020-12-01 MED ORDER — ONDANSETRON 4 MG PO TBDP
4.0000 mg | ORAL_TABLET | Freq: Three times a day (TID) | ORAL | Status: DC | PRN
Start: 2020-12-01 — End: 2020-12-03
  Administered 2020-12-01 – 2020-12-03 (×3): 4 mg via ORAL
  Filled 2020-12-01 (×3): qty 1

## 2020-12-01 MED ORDER — ONDANSETRON 4 MG PO TBDP
4.0000 mg | ORAL_TABLET | Freq: Once | ORAL | Status: AC
  Administered 2020-12-01: 4 mg via ORAL
  Filled 2020-12-01: qty 1

## 2020-12-01 NOTE — ED Notes (Signed)
Pt stating to this RN that she needs nausea medication prior to regular medication administration. MD made aware. No new orders at this time.

## 2020-12-01 NOTE — ED Notes (Signed)
Pt calling out for [employer]. RN at bedside. Pt c/o increased swelling and redness to left hand that is in cast. This RN asking pt if she can feel RN touching her fingers to assess for sensation. Pt stating, I feel you mashing my finger you're not touching it. Pt stating she wants it assessed. MD made aware.

## 2020-12-01 NOTE — TOC Progression Note (Signed)
Transition of Care [facility]) - Progression Note    Patient Details  Name: Alison Price MRN: [id number] Date of Birth: [birth year]  Transition of Care [facility]) CM/SW Contact  [place] Cellar, RN Phone Number: 12/01/2020, 1:28 PM  Clinical Narrative:    Insurance authorization started via portal. All clinical submitted. PASSR waiver in place.      Barriers to Discharge: Unsafe home situation  Expected Discharge Plan and Services                                                 Social Determinants of Health (SDOH) Interventions    Readmission Risk Interventions No flowsheet data found.

## 2020-12-01 NOTE — TOC Progression Note (Addendum)
Transition of Care [facility]) - Progression Note    Patient Details  Name: Alison Price MRN: [id number] Date of Birth: [birth year]  Transition of Care [facility]) CM/SW Contact  [place] Cellar, RN Phone Number: 12/01/2020, 12:12 PM  Clinical Narrative:    LVMM for Cabell,Brenda (Sister)  [phone] to present bed offer.  Spoke with [name], Sister who agreed to bed offer at [facility]. Will proceed with insurance authorization.   [name] reports she is having hip replacement Monday and will not be able to assist with care however her hope is to recover and return to caring for her sister. Requested updates be sent to her husband, Dossie Der [phone] after Monday due to her having surgery. Updated will be working for insurance authorization and possible discharge Monday due to facility not accepting on Sundays.       Barriers to Discharge: Unsafe home situation  Expected Discharge Plan and Services                                                 Social Determinants of Health (SDOH) Interventions    Readmission Risk Interventions No flowsheet data found.

## 2020-12-01 NOTE — ED Provider Notes (Signed)
Patient reports feeling nauseated.  States that she gets nauseated when she takes pain medicine.  She is been on 10 mg oxycodone at this point she reports the nausea seems to be more problematic than the arm pain.  Discussed with her we will trial decreasing oxycodone to 5 mg provided additional as needed Zofran at this time.  She is not vomiting she is in no acute distress fully awake and alert.   Sharyn Creamer, MD 12/01/20 1331

## 2020-12-01 NOTE — ED Notes (Signed)
Pt transferred to hospital bed

## 2020-12-01 NOTE — ED Notes (Signed)
Pt given meal tray.

## 2020-12-01 NOTE — ED Notes (Signed)
Pt c/o of nausea. Order parameters not met for PRN zofran. MD made aware.

## 2020-12-01 NOTE — ED Notes (Signed)
MD at bedside to reassess pt

## 2020-12-01 NOTE — ED Provider Notes (Signed)
Today's Vitals   11/30/20 1421 11/30/20 1600 11/30/20 1800 11/30/20 1845  BP: (!) 116/52   (!) 96/52  Pulse: 83   70  Resp: 18   18  Temp: 98.4 F (36.9 C)   98.7 F (37.1 C)  TempSrc: Oral   Oral  SpO2: 98%   99%  Weight:      Height:      PainSc:  Asleep Asleep 6    Body mass index is 19.51 kg/m.  Patient resting comfortably.  No acute complaints.  Awaiting social work placement.   Monique Gift, Layla Maw, DO 12/01/20 [phone]

## 2020-12-02 NOTE — ED Notes (Signed)
Family [name]) called to check on pt. This Rn spoke with her.

## 2020-12-02 NOTE — ED Notes (Signed)
Pt advised she was in pain and asked for medications.

## 2020-12-02 NOTE — ED Notes (Signed)
Rn to bedside to introduce self to pt. Pt awake and asked for  a warm blanket. Purewick in place. Provided a warm blanket.

## 2020-12-02 NOTE — ED Notes (Signed)
Changed wet brief.

## 2020-12-02 NOTE — ED Notes (Signed)
RN changed wet brief and wet gown.

## 2020-12-03 LAB — RESP PANEL BY RT-PCR (FLU A&B, COVID) ARPGX2
Influenza A by PCR: NEGATIVE
Influenza B by PCR: NEGATIVE
SARS Coronavirus 2 by RT PCR: NEGATIVE

## 2020-12-03 NOTE — TOC Transition Note (Signed)
Transition of Care [facility]) - CM/SW Discharge Note   Patient Details  Name: Alison Price MRN: [id number] Date of Birth: 10/23/1945  [facility]) CM/SW Contact:  Joseph Art, LCSWA Phone Number: 12/03/2020, 12:27 PM   Clinical Narrative:     Patient will discharge to [facility] Room# 1105A, report [phone].  CSW requested AVS, faxed to [phone].  Facility will not accept the patient until the AVS has been faxed. EDP/ED Staff updated.  CSW left voicemail with update for patient's brother-in-law Dossie Der [phone].    Barriers to Discharge: Unsafe home situation   Patient Goals and CMS Choice        Discharge Placement                       Discharge Plan and Services                                     Social Determinants of Health (SDOH) Interventions     Readmission Risk Interventions No flowsheet data found.

## 2020-12-03 NOTE — ED Notes (Signed)
Attempted to call report to [facility] x[facility]. Number left for call back

## 2020-12-03 NOTE — TOC Progression Note (Signed)
Transition of Care [facility]) - Progression Note    Patient Details  Name: TAMRA KOOS MRN: [id number] Date of Birth: 12/24/1945  Transition of Care [facility]) CM/SW Contact  Marina Goodell Phone Number: [phone] 12/03/2020, 10:11 AM  Clinical Narrative:     Patient has received insurance approval for [facility].  CSW updated Alvino Chapel at [facility], who stated she would contact this CSW, once she confirmed bed availability.  CSW updated EDP/ED Staff and requested COVID test.     Barriers to Discharge: Unsafe home situation  Expected Discharge Plan and Services                                                 Social Determinants of Health (SDOH) Interventions    Readmission Risk Interventions No flowsheet data found.

## 2020-12-03 NOTE — ED Notes (Signed)
Pt repositioned in bed, wet brief and purewick changed. Pt also given breakfast tray at this time.

## 2020-12-03 NOTE — ED Notes (Signed)
Report given to Nurse Tilman Neat at [facility]

## 2020-12-03 NOTE — TOC Progression Note (Signed)
Transition of Care [facility]) - Progression Note    Patient Details  Name: Alison Price MRN: [id number] Date of Birth: 24-Jun-1945  Transition of Care [facility]) CM/SW Contact  [place] Cellar, RN Phone Number: 12/03/2020, 9:58 AM  Clinical Narrative:    SNF authorization completed. Auth [id number] for 3 days.      Barriers to Discharge: Unsafe home situation  Expected Discharge Plan and Services                                                 Social Determinants of Health (SDOH) Interventions    Readmission Risk Interventions No flowsheet data found.

## 2020-12-03 NOTE — Progress Notes (Signed)
Physical Therapy Treatment Patient Details Name: Alison Price MRN: [id number] DOB: 28-Nov-1945 Today's Date: 12/03/2020   History of Present Illness Pt is a 75 y/o F with a fall last month where she suffered L wrist fx & R knee injury (treated nonsurgically). Pt initially discharged to [facility] then to her sister's house. Pt's sister/family sent pt to ER because pt's sister may have an upcoming surgery & is unable to care for pt. PMH: anemia, arthritis, chronic pain, GERD, HTN, osteoporosis, stevens-johnson syndrome    PT Comments    Pt seen for PT tx with pt much more pleasant & agreeable on this date. Pt is able to initiate supine<>sit but requires min assist 2/2 bed flat. Pt endorses dizziness that worsens once sitting EOB & persists even after returning to supine/semi fowler position in bed. BP noted to be low & nurse made aware. Pt performs RLE strengthening exercises with more tolerance to movement. Pt would benefit from continued PT services to progress transfers & standing/gait as able. Continue to recommend STR upon d/c to maximize independence with functional mobility & reduce fall risk prior to return home.  BP sitting EOB in RUE: 95/51 mmHg MAP 65 BP supine in LUE: 80/54 mmHg MAP 63 BP semi fowler in LUE: 84/65 mmHg MAP 73   Recommendations for follow up therapy are one component of a multi-disciplinary discharge planning process, led by the attending physician.  Recommendations may be updated based on patient status, additional functional criteria and insurance authorization.  Follow Up Recommendations  SNF;Supervision/Assistance - 24 hour     Equipment Recommendations  None recommended by PT    Recommendations for Other Services       Precautions / Restrictions Precautions Precautions: Fall Required Braces or Orthoses: Knee Immobilizer - Right Restrictions Weight Bearing Restrictions: Yes LUE Weight Bearing: Weight bear through elbow only RLE Weight  Bearing: Weight bearing as tolerated Other Position/Activity Restrictions: precautions taken from chart during last hospital admission     Mobility  Bed Mobility Overal bed mobility: Needs Assistance Bed Mobility: Rolling;Supine to Sit;Sit to Supine Rolling: Supervision (extra time, use of bed rails) - pt rolled L<>R to allow PT to don clean/new brief & place new purwick, decreased ability to roll entirely to L   Supine to sit: Min assist (assistance to upright trunk from flat bed) Sit to supine: Min assist (assistance 2/2 c/o dizziness & low BP)   General bed mobility comments: extra time but improved ability to move BLE to EOB    Transfers                    Ambulation/Gait                 Stairs             Wheelchair Mobility    Modified Rankin (Stroke Patients Only)       Balance Overall balance assessment: Needs assistance Sitting-balance support: Feet unsupported;Bilateral upper extremity supported Sitting balance-Leahy Scale: Fair Sitting balance - Comments: close supervision static sitting                                    Cognition Arousal/Alertness: Awake/alert Behavior During Therapy: WFL for tasks assessed/performed Overall Cognitive Status: No family/caregiver present to determine baseline cognitive functioning  General Comments: Pt much more pleasant during session on this date, but does not remember working with PT a few days ago. AxOx3, still unaware of time. More agreeable to mobility.      Exercises General Exercises - Lower Extremity Hip ABduction/ADduction: AAROM;Right;10 reps;Supine (RLE hip abduction slides) Straight Leg Raises: AAROM;Right;10 reps;Supine    General Comments        Pertinent Vitals/Pain Pain Assessment: Faces Faces Pain Scale: Hurts a little bit Pain Location: RLE Pain Descriptors / Indicators: Grimacing;Guarding Pain Intervention(s):  Repositioned;Monitored during session    Home Living                      Prior Function            PT Goals (current goals can now be found in the care plan section) Acute Rehab PT Goals Patient Stated Goal: decreased pain PT Goal Formulation: With patient Time For Goal Achievement: 12/14/20 Potential to Achieve Goals: Fair Progress towards PT goals: Progressing toward goals    Frequency    Min 2X/week      PT Plan Current plan remains appropriate    Co-evaluation              AM-PAC PT "6 Clicks" Mobility   Outcome Measure  Help needed turning from your back to your side while in a flat bed without using bedrails?: A Little Help needed moving from lying on your back to sitting on the side of a flat bed without using bedrails?: A Lot Help needed moving to and from a bed to a chair (including a wheelchair)?: Total Help needed standing up from a chair using your arms (e.g., wheelchair or bedside chair)?: Total Help needed to walk in hospital room?: Total Help needed climbing 3-5 steps with a railing? : Total 6 Click Score: 9    End of Session Equipment Utilized During Treatment: Right knee immobilizer Activity Tolerance: Treatment limited secondary to medical complications (Comment) Patient left: in bed;with call bell/phone within reach;with bed alarm set;with nursing/sitter in room Nurse Communication:  (c/o nausea & low BP, need for assistance to cut up meals) PT Visit Diagnosis: Muscle weakness (generalized) (M62.81);Other abnormalities of gait and mobility (R26.89);Difficulty in walking, not elsewhere classified (R26.2);Pain Pain - Right/Left: Right Pain - part of body: Leg     Time: 0912-0935 PT Time Calculation (min) (ACUTE ONLY): 23 min  Charges:  $Therapeutic Activity: 23-37 mins                     Aleda Grana, PT, DPT 12/03/20, 9:46 AM    Sandi Mariscal 12/03/2020, 9:43 AM

## 2020-12-03 NOTE — ED Notes (Signed)
Report from vanessa, rn 

## 2020-12-03 NOTE — ED Notes (Signed)
Report to olivia, rn.  

## 2021-05-22 ENCOUNTER — Ambulatory Visit: Payer: Medicare Other | Admitting: Urology

## 2021-05-29 ENCOUNTER — Ambulatory Visit: Payer: Medicare Other | Admitting: Urology

## 2021-06-12 ENCOUNTER — Encounter: Payer: Self-pay | Admitting: Urology

## 2021-06-12 ENCOUNTER — Ambulatory Visit: Payer: Medicare Other | Admitting: Urology

## 2022-01-30 ENCOUNTER — Encounter: Payer: Self-pay | Admitting: Oncology

## 2022-02-01 ENCOUNTER — Encounter: Payer: Self-pay | Admitting: Oncology

## 2022-06-18 DIAGNOSIS — N132 Hydronephrosis with renal and ureteral calculous obstruction: Secondary | ICD-10-CM

## 2022-06-18 HISTORY — DX: Hydronephrosis with renal and ureteral calculous obstruction: N13.2

## 2022-06-21 ENCOUNTER — Other Ambulatory Visit: Payer: Self-pay

## 2022-06-21 ENCOUNTER — Inpatient Hospital Stay
Admission: EM | Admit: 2022-06-21 | Discharge: 2022-06-30 | DRG: 853 | Disposition: A | Discharge status: Skilled Nursing Facility | Payer: Medicare Other | Attending: Family Medicine | Admitting: Family Medicine

## 2022-06-21 ENCOUNTER — Emergency Department: Payer: Medicare Other

## 2022-06-21 DIAGNOSIS — N132 Hydronephrosis with renal and ureteral calculous obstruction: Secondary | ICD-10-CM | POA: Diagnosis not present

## 2022-06-21 DIAGNOSIS — Z1152 Encounter for screening for COVID-19: Secondary | ICD-10-CM

## 2022-06-21 DIAGNOSIS — E872 Acidosis, unspecified: Secondary | ICD-10-CM

## 2022-06-21 DIAGNOSIS — R296 Repeated falls: Secondary | ICD-10-CM | POA: Diagnosis present

## 2022-06-21 DIAGNOSIS — J189 Pneumonia, unspecified organism: Secondary | ICD-10-CM | POA: Diagnosis present

## 2022-06-21 DIAGNOSIS — F32A Depression, unspecified: Secondary | ICD-10-CM | POA: Diagnosis present

## 2022-06-21 DIAGNOSIS — N200 Calculus of kidney: Secondary | ICD-10-CM | POA: Diagnosis not present

## 2022-06-21 DIAGNOSIS — R6521 Severe sepsis with septic shock: Secondary | ICD-10-CM | POA: Diagnosis present

## 2022-06-21 DIAGNOSIS — I420 Dilated cardiomyopathy: Secondary | ICD-10-CM | POA: Diagnosis present

## 2022-06-21 DIAGNOSIS — N179 Acute kidney failure, unspecified: Secondary | ICD-10-CM

## 2022-06-21 DIAGNOSIS — D649 Anemia, unspecified: Secondary | ICD-10-CM | POA: Diagnosis present

## 2022-06-21 DIAGNOSIS — Z7901 Long term (current) use of anticoagulants: Secondary | ICD-10-CM | POA: Diagnosis not present

## 2022-06-21 DIAGNOSIS — E876 Hypokalemia: Secondary | ICD-10-CM | POA: Diagnosis present

## 2022-06-21 DIAGNOSIS — K219 Gastro-esophageal reflux disease without esophagitis: Secondary | ICD-10-CM | POA: Diagnosis present

## 2022-06-21 DIAGNOSIS — Z90710 Acquired absence of both cervix and uterus: Secondary | ICD-10-CM

## 2022-06-21 DIAGNOSIS — R7881 Bacteremia: Secondary | ICD-10-CM

## 2022-06-21 DIAGNOSIS — A419 Sepsis, unspecified organism: Secondary | ICD-10-CM | POA: Diagnosis present

## 2022-06-21 DIAGNOSIS — Z22322 Carrier or suspected carrier of Methicillin resistant Staphylococcus aureus: Secondary | ICD-10-CM

## 2022-06-21 DIAGNOSIS — F0390 Unspecified dementia without behavioral disturbance: Secondary | ICD-10-CM | POA: Diagnosis not present

## 2022-06-21 DIAGNOSIS — Z66 Do not resuscitate: Secondary | ICD-10-CM | POA: Diagnosis present

## 2022-06-21 DIAGNOSIS — I5021 Acute systolic (congestive) heart failure: Secondary | ICD-10-CM | POA: Diagnosis not present

## 2022-06-21 DIAGNOSIS — R6520 Severe sepsis without septic shock: Secondary | ICD-10-CM | POA: Diagnosis present

## 2022-06-21 DIAGNOSIS — A4151 Sepsis due to Escherichia coli [E. coli]: Secondary | ICD-10-CM | POA: Diagnosis present

## 2022-06-21 DIAGNOSIS — N390 Urinary tract infection, site not specified: Secondary | ICD-10-CM | POA: Diagnosis not present

## 2022-06-21 DIAGNOSIS — I480 Paroxysmal atrial fibrillation: Secondary | ICD-10-CM

## 2022-06-21 DIAGNOSIS — F0393 Unspecified dementia, unspecified severity, with mood disturbance: Secondary | ICD-10-CM | POA: Diagnosis present

## 2022-06-21 DIAGNOSIS — Z681 Body mass index (BMI) 19 or less, adult: Secondary | ICD-10-CM

## 2022-06-21 DIAGNOSIS — Z79899 Other long term (current) drug therapy: Secondary | ICD-10-CM | POA: Diagnosis not present

## 2022-06-21 DIAGNOSIS — Z885 Allergy status to narcotic agent status: Secondary | ICD-10-CM

## 2022-06-21 DIAGNOSIS — N184 Chronic kidney disease, stage 4 (severe): Secondary | ICD-10-CM | POA: Diagnosis present

## 2022-06-21 DIAGNOSIS — N136 Pyonephrosis: Secondary | ICD-10-CM | POA: Diagnosis present

## 2022-06-21 DIAGNOSIS — N130 Hydronephrosis with ureteropelvic junction obstruction: Secondary | ICD-10-CM | POA: Diagnosis not present

## 2022-06-21 DIAGNOSIS — N170 Acute kidney failure with tubular necrosis: Secondary | ICD-10-CM | POA: Diagnosis present

## 2022-06-21 DIAGNOSIS — Z8049 Family history of malignant neoplasm of other genital organs: Secondary | ICD-10-CM

## 2022-06-21 DIAGNOSIS — Z811 Family history of alcohol abuse and dependence: Secondary | ICD-10-CM

## 2022-06-21 DIAGNOSIS — R636 Underweight: Secondary | ICD-10-CM | POA: Diagnosis present

## 2022-06-21 DIAGNOSIS — B9620 Unspecified Escherichia coli [E. coli] as the cause of diseases classified elsewhere: Secondary | ICD-10-CM

## 2022-06-21 DIAGNOSIS — R4182 Altered mental status, unspecified: Principal | ICD-10-CM

## 2022-06-21 DIAGNOSIS — I130 Hypertensive heart and chronic kidney disease with heart failure and stage 1 through stage 4 chronic kidney disease, or unspecified chronic kidney disease: Secondary | ICD-10-CM | POA: Diagnosis present

## 2022-06-21 DIAGNOSIS — Z882 Allergy status to sulfonamides status: Secondary | ICD-10-CM

## 2022-06-21 DIAGNOSIS — Z833 Family history of diabetes mellitus: Secondary | ICD-10-CM

## 2022-06-21 DIAGNOSIS — M810 Age-related osteoporosis without current pathological fracture: Secondary | ICD-10-CM | POA: Diagnosis present

## 2022-06-21 DIAGNOSIS — Z800 Family history of malignant neoplasm of digestive organs: Secondary | ICD-10-CM

## 2022-06-21 DIAGNOSIS — Z87891 Personal history of nicotine dependence: Secondary | ICD-10-CM

## 2022-06-21 DIAGNOSIS — R32 Unspecified urinary incontinence: Secondary | ICD-10-CM | POA: Diagnosis present

## 2022-06-21 DIAGNOSIS — I4891 Unspecified atrial fibrillation: Secondary | ICD-10-CM | POA: Diagnosis not present

## 2022-06-21 HISTORY — DX: Carrier or suspected carrier of methicillin resistant Staphylococcus aureus: Z22.322

## 2022-06-21 HISTORY — DX: Paroxysmal atrial fibrillation: I48.0

## 2022-06-21 HISTORY — DX: Unspecified Escherichia coli (E. coli) as the cause of diseases classified elsewhere: B96.20

## 2022-06-21 HISTORY — DX: Bacteremia: R78.81

## 2022-06-21 LAB — CBC WITH DIFFERENTIAL/PLATELET
Abs Immature Granulocytes: 1.09 10*3/uL — ABNORMAL HIGH (ref 0.00–0.07)
Basophils Absolute: 0 10*3/uL (ref 0.0–0.1)
Basophils Relative: 0 %
Eosinophils Absolute: 0 10*3/uL (ref 0.0–0.5)
Eosinophils Relative: 0 %
HCT: 35.7 % — ABNORMAL LOW (ref 36.0–46.0)
Hemoglobin: 12.1 g/dL (ref 12.0–15.0)
Immature Granulocytes: 22 %
Lymphocytes Relative: 4 %
Lymphs Abs: 0.2 10*3/uL — ABNORMAL LOW (ref 0.7–4.0)
MCH: 31.6 pg (ref 26.0–34.0)
MCHC: 33.9 g/dL (ref 30.0–36.0)
MCV: 93.2 fL (ref 80.0–100.0)
Monocytes Absolute: 0.1 10*3/uL (ref 0.1–1.0)
Monocytes Relative: 3 %
Neutro Abs: 3.5 10*3/uL (ref 1.7–7.7)
Neutrophils Relative %: 71 %
Platelets: 43 10*3/uL — ABNORMAL LOW (ref 150–400)
RBC: 3.83 MIL/uL — ABNORMAL LOW (ref 3.87–5.11)
RDW: 14.7 % (ref 11.5–15.5)
Smear Review: DECREASED
WBC: 4.9 10*3/uL (ref 4.0–10.5)
nRBC: 0 % (ref 0.0–0.2)

## 2022-06-21 LAB — COMPREHENSIVE METABOLIC PANEL
ALT: 12 U/L (ref 0–44)
AST: 46 U/L — ABNORMAL HIGH (ref 15–41)
Albumin: 3.6 g/dL (ref 3.5–5.0)
Alkaline Phosphatase: 134 U/L — ABNORMAL HIGH (ref 38–126)
Anion gap: 15 (ref 5–15)
BUN: 64 mg/dL — ABNORMAL HIGH (ref 8–23)
CO2: 22 mmol/L (ref 22–32)
Calcium: 8.3 mg/dL — ABNORMAL LOW (ref 8.9–10.3)
Chloride: 104 mmol/L (ref 98–111)
Creatinine, Ser: 3.07 mg/dL — ABNORMAL HIGH (ref 0.44–1.00)
GFR, Estimated: 15 mL/min — ABNORMAL LOW (ref >60)
Glucose, Bld: 83 mg/dL (ref 70–99)
Potassium: 2.9 mmol/L — ABNORMAL LOW (ref 3.5–5.1)
Sodium: 141 mmol/L (ref 135–145)
Total Bilirubin: 1.3 mg/dL — ABNORMAL HIGH (ref 0.3–1.2)
Total Protein: 6.8 g/dL (ref 6.5–8.1)

## 2022-06-21 LAB — URINALYSIS, W/ REFLEX TO CULTURE (INFECTION SUSPECTED)
Bilirubin Urine: NEGATIVE
Glucose, UA: NEGATIVE mg/dL
Ketones, ur: NEGATIVE mg/dL
Nitrite: NEGATIVE
Protein, ur: 100 mg/dL — AB
Specific Gravity, Urine: 1.014 (ref 1.005–1.030)
pH: 7 (ref 5.0–8.0)

## 2022-06-21 LAB — BASIC METABOLIC PANEL
Anion gap: 13 (ref 5–15)
BUN: 64 mg/dL — ABNORMAL HIGH (ref 8–23)
CO2: 20 mmol/L — ABNORMAL LOW (ref 22–32)
Calcium: 8.1 mg/dL — ABNORMAL LOW (ref 8.9–10.3)
Chloride: 104 mmol/L (ref 98–111)
Creatinine, Ser: 2.7 mg/dL — ABNORMAL HIGH (ref 0.44–1.00)
GFR, Estimated: 18 mL/min — ABNORMAL LOW (ref >60)
Glucose, Bld: 97 mg/dL (ref 70–99)
Potassium: 3.6 mmol/L (ref 3.5–5.1)
Sodium: 137 mmol/L (ref 135–145)

## 2022-06-21 LAB — TROPONIN I (HIGH SENSITIVITY): Troponin I (High Sensitivity): 85 ng/L — ABNORMAL HIGH (ref <18)

## 2022-06-21 LAB — RESP PANEL BY RT-PCR (RSV, FLU A&B, COVID)  RVPGX2
Influenza A by PCR: NEGATIVE
Influenza B by PCR: NEGATIVE
Resp Syncytial Virus by PCR: NEGATIVE
SARS Coronavirus 2 by RT PCR: NEGATIVE

## 2022-06-21 LAB — LACTIC ACID, PLASMA
Lactic Acid, Venous: 4.4 mmol/L (ref 0.5–1.9)
Lactic Acid, Venous: 5.5 mmol/L (ref 0.5–1.9)

## 2022-06-21 LAB — D-DIMER, QUANTITATIVE: D-Dimer, Quant: 8.22 ug/mL-FEU — ABNORMAL HIGH (ref 0.00–0.50)

## 2022-06-21 LAB — T4, FREE: Free T4: 0.96 ng/dL (ref 0.61–1.12)

## 2022-06-21 LAB — TSH: TSH: 1.571 u[IU]/mL (ref 0.350–4.500)

## 2022-06-21 LAB — PROCALCITONIN: Procalcitonin: >150 ng/mL

## 2022-06-21 LAB — GLUCOSE, CAPILLARY: Glucose-Capillary: 92 mg/dL (ref 70–99)

## 2022-06-21 MED ORDER — LACTATED RINGERS IV BOLUS: 1000.0000 mL | Freq: Once | INTRAVENOUS | Status: DC

## 2022-06-21 MED ORDER — NOREPINEPHRINE 4 MG/250ML-% IV SOLN: 2.0000 ug/min | INTRAVENOUS | Status: DC

## 2022-06-21 MED ORDER — SODIUM CHLORIDE 0.9 % IV SOLN
2.0000 g | INTRAVENOUS | Status: AC
  Administered 2022-06-22 – 2022-06-27 (×6): 2 g via INTRAVENOUS
  Filled 2022-06-21: qty 20
  Filled 2022-06-21 (×2): qty 2
  Filled 2022-06-21: qty 20
  Filled 2022-06-21: qty 2
  Filled 2022-06-21: qty 20
  Filled 2022-06-21 (×2): qty 2

## 2022-06-21 MED ORDER — SODIUM CHLORIDE 0.9 % IV SOLN
250.0000 mL | INTRAVENOUS | Status: DC
  Administered 2022-06-21: 250 mL via INTRAVENOUS

## 2022-06-21 MED ORDER — AMIODARONE HCL IN DEXTROSE 360-4.14 MG/200ML-% IV SOLN
60.0000 mg/h | INTRAVENOUS | Status: AC
  Administered 2022-06-21 – 2022-06-22 (×3): 60 mg/h via INTRAVENOUS
  Filled 2022-06-21 (×2): qty 200

## 2022-06-21 MED ORDER — HEPARIN SODIUM (PORCINE) 5000 UNIT/ML IJ SOLN
5000.0000 [IU] | Freq: Three times a day (TID) | INTRAMUSCULAR | Status: DC
  Administered 2022-06-21 – 2022-06-27 (×17): 5000 [IU] via SUBCUTANEOUS
  Filled 2022-06-21 (×17): qty 1

## 2022-06-21 MED ORDER — LACTATED RINGERS IV BOLUS
1000.0000 mL | Freq: Once | INTRAVENOUS | Status: AC
  Administered 2022-06-21: 1000 mL via INTRAVENOUS

## 2022-06-21 MED ORDER — LACTATED RINGERS IV SOLN: INTRAVENOUS | Status: DC

## 2022-06-21 MED ORDER — AMIODARONE HCL IN DEXTROSE 360-4.14 MG/200ML-% IV SOLN
30.0000 mg/h | INTRAVENOUS | Status: DC
  Administered 2022-06-22: 30 mg/h via INTRAVENOUS

## 2022-06-21 MED ORDER — SODIUM CHLORIDE 0.9 % IV BOLUS
1000.0000 mL | Freq: Once | INTRAVENOUS | Status: AC
  Administered 2022-06-21: 1000 mL via INTRAVENOUS

## 2022-06-21 MED ORDER — AMIODARONE HCL IN DEXTROSE 360-4.14 MG/200ML-% IV SOLN
INTRAVENOUS | Status: AC
  Administered 2022-06-21: 150 mg via INTRAVENOUS
  Filled 2022-06-21: qty 200

## 2022-06-21 MED ORDER — AMIODARONE LOAD VIA INFUSION
150.0000 mg | Freq: Once | INTRAVENOUS | Status: AC
  Filled 2022-06-21: qty 83.34

## 2022-06-21 MED ORDER — POTASSIUM CHLORIDE 10 MEQ/100ML IV SOLN
10.0000 meq | INTRAVENOUS | Status: AC
  Administered 2022-06-21 (×4): 10 meq via INTRAVENOUS
  Filled 2022-06-21 (×4): qty 100

## 2022-06-21 MED ORDER — VANCOMYCIN HCL IN DEXTROSE 1-5 GM/200ML-% IV SOLN
1000.0000 mg | Freq: Once | INTRAVENOUS | Status: AC
  Administered 2022-06-21: 1000 mg via INTRAVENOUS
  Filled 2022-06-21: qty 200

## 2022-06-21 MED ORDER — SODIUM CHLORIDE 0.9 % IV SOLN
2.0000 g | Freq: Once | INTRAVENOUS | Status: AC
  Administered 2022-06-21: 2 g via INTRAVENOUS
  Filled 2022-06-21: qty 12.5

## 2022-06-21 MED ORDER — POLYETHYLENE GLYCOL 3350 17 G PO PACK: 17.0000 g | PACK | Freq: Every day | ORAL | Status: DC | PRN

## 2022-06-21 MED ORDER — SODIUM CHLORIDE 0.9 % IV SOLN
500.0000 mg | INTRAVENOUS | Status: DC
  Filled 2022-06-21: qty 5

## 2022-06-21 MED ORDER — DOCUSATE SODIUM 100 MG PO CAPS: 100.0000 mg | ORAL_CAPSULE | Freq: Two times a day (BID) | ORAL | Status: DC | PRN

## 2022-06-21 NOTE — Consult Note (Signed)
PHARMACY CONSULT NOTE - FOLLOW UP  Pharmacy Consult for Electrolyte Monitoring and Replacement   Recent Labs: Potassium (mmol/L)  Date Value  06/21/2022 2.9 (L)  05/04/2013 3.7   Magnesium (mg/dL)  Date Value  16/11/9602 2.0  04/20/2011 1.9   Calcium (mg/dL)  Date Value  54/10/8117 8.3 (L)   Calcium, Total (mg/dL)  Date Value  14/78/2956 8.9   Albumin (g/dL)  Date Value  21/30/8657 3.6  01/06/2013 3.6   Phosphorus (mg/dL)  Date Value  84/69/6295 2.9   Sodium (mmol/L)  Date Value  06/21/2022 141  04/19/2011 139    Assessment: 77yo female patient with PMH of amenia, dyspnea, GERD, HTN, and Stevens-Johnson syndrome, presents to ED with AMS, hypotension, and possible sepsis. Pharmacy consulted to manage electrolytes as part of CCM.   Goal of Therapy:  Electrolytes WNL  Plan:  K = 2.9 on admission, Na remains WNL, and no additional electrolytes available at this time. Potassium replacement , Kcl IVPB x 4 doses, already ordered by ED provider. 3rd bag currently in process. Will follow AM labs and replace electrolytes as needed.   Lanie Schelling Rodriguez-Guzman PharmD, BCPS 06/21/2022 9:25 PM

## 2022-06-21 NOTE — Progress Notes (Signed)
Pharmacy generated consult received for US guided IV due to vasopressors being ordered. Vasopressors on hold for now and US guided IV not needed at this time per Shari Prows, [place]. Consult to be placed if vasopressors started.

## 2022-06-21 NOTE — H&P (Incomplete)
NAME:  Alison Price, MRN:  [id number], DOB:  18-Dec-1945, LOS: 1 ADMISSION DATE:  06/21/2022, CONSULTATION DATE: 06/21/2022 REFERRING MD: Phineas Semen, CHIEF COMPLAINT: Altered mental status   HPI  77 y.o female with significant PMH of dementia, hypertension, GERD, arthritis, Stevens-Johnson's syndrome, recurrent falls with recent right patella and left wrist fracture who presented to the ED with chief complaints of altered mental status since this morning.   ED Course: Initial vital signs showed HR of 120 beats/minute, BP 76/54 mm Hg, the RR 21 breaths/minute, and the oxygen saturation 94% on  RA and a temperature of 97.57F (36.5C).   Pertinent Labs/Diagnostics Findings: Na+/ K+: 141/2.9.  Glucose: 83 BUN/Cr.:  64/3.07  AST/ALT: 46/12 WBC: 4.9 Hgb/Hct: 12.1/35.7 plts: 43 UA:+ UTI PCT: >150 Lactic acid: 4.4>5.5 COVID PCR: Negative CXR> patchy bibasilar consolidation consistent with atelectasis or airspace disease  Patient given 30 cc/kg of fluids and started on broad-spectrum antibiotics Vanco AND cefepime  for sepsis with septic shock. Patient remained hypotensive despite IVF boluses therefore was started on Levophed.  (Sepsis reassessment completed). PCCM consulted.  Past Medical History  Dementia, hypertension, GERD, arthritis, Stevens-Johnson's syndrome, recurrent falls with recent right patella and left wrist fracture   Significant Hospital Events   06/21/22: Admitted to ICU with severe sepsis with shock secondary to suspected UTI and probable pneumonia  Consults:  None  Procedures:  None  Significant Diagnostic Tests:  06/21/22: Chest Xray>  IMPRESSION: 1. Low lung volumes, with patchy bibasilar consolidation consistent with atelectasis or airspace disease. 2. Enlarged cardiac silhouett  Interim History / Subjective:  -Remains altered  Micro Data:  5/4: SARS-CoV-2 PCR> negative 5/4: Influenza PCR> negative 5/4: Blood culture x2> 5/4: Urine Culture> 5/4:  MRSA PCR>>  5/4: Strep pneumo urinary antigen> 5/4: Legionella urinary antigen>  Antimicrobials:  Vancomycin 5/4> x 1 in the ED Cefepime 5/4>x 1 in the ED Azithromycin 5/4> Ceftriaxone 5/4>  OBJECTIVE  Blood pressure 98/70, pulse (!) 32, temperature 100 F (37.8 C), resp. rate (!) 29, height 5\' 10"  (1.778 m), weight 58.4 kg, SpO2 96 %.        Intake/Output Summary (Last 24 hours) at 06/22/2022 0142 Last data filed at 06/22/2022 0000 Gross per 24 hour  Intake 1611.15 ml  Output --  Net 1611.15 ml   Filed Weights   06/21/22 1610  Weight: 58.4 kg   Physical Examination  GENERAL:77  year-old critically ill patient lying in the bed in no acute distress EYES: PEERLA. No scleral icterus. Extraocular muscles intact.  HEENT: Head atraumatic, normocephalic. Oropharynx and nasopharynx clear.  NECK:  No JVD, supple  LUNGS: Normal breath sounds bilaterally.  No use of accessory muscles of respiration.  CARDIOVASCULAR: S1, S2 normal. No murmurs, rubs, or gallops.  ABDOMEN: Soft, NTND EXTREMITIES: No swelling or erythema.  Capillary refill is > 3 seconds in all extremities. Pulses palpable distally. NEUROLOGIC:The patient is oriented to self only. No focal neurological deficit. Cranial nerves are intact.  SKIN: No obvious rash, lesion, or ulcer. Warm to touch Labs/imaging that I havepersonally reviewed  (right click and "Reselect all SmartList Selections" daily)     Labs   CBC: Recent Labs  Lab 06/21/22 1601  WBC 4.9  NEUTROABS 3.5  HGB 12.1  HCT 35.7*  MCV 93.2  PLT 43*    Basic Metabolic Panel: Recent Labs  Lab 06/21/22 1601 06/21/22 2301  NA 141 137  K 2.9* 3.6  CL 104 104  CO2 22 20*  GLUCOSE 83 97  BUN 64* 64*  CREATININE 3.07* 2.70*  CALCIUM 8.3* 8.1*   GFR: Estimated Creatinine Clearance: 16.3 mL/min (A) (by C-G formula based on SCr of 2.7 mg/dL (H)). Recent Labs  Lab 06/21/22 1601 06/21/22 1947 06/21/22 2358  PROCALCITON >150.00  --   --   WBC 4.9  --    --   LATICACIDVEN 4.4* 5.5* 2.7*    Liver Function Tests: Recent Labs  Lab 06/21/22 1601  AST 46*  ALT 12  ALKPHOS 134*  BILITOT 1.3*  PROT 6.8  ALBUMIN 3.6   No results for input(s): "LIPASE", "AMYLASE" in the last 168 hours. No results for input(s): "AMMONIA" in the last 168 hours.  ABG No results found for: "PHART", "PCO2ART", "PO2ART", "HCO3", "TCO2", "ACIDBASEDEF", "O2SAT"   Coagulation Profile: No results for input(s): "INR", "PROTIME" in the last 168 hours.  Cardiac Enzymes: No results for input(s): "CKTOTAL", "CKMB", "CKMBINDEX", "TROPONINI" in the last 168 hours.  HbA1C: Hemoglobin A1C  Date/Time Value Ref Range Status  04/19/2011 04:44 AM 5.6 4.2 - 6.3 % Final    Comment:    The American Diabetes Association recommends that a primary goal of therapy should be <7% and that physicians should reevaluate the treatment regimen in patients with HbA1c values consistently >8%.    Hgb A1c MFr Bld  Date/Time Value Ref Range Status  06/29/2019 10:23 AM 5.0 4.8 - 5.6 % Final    Comment:    (NOTE) Pre diabetes:          5.7%-6.4% Diabetes:              >6.4% Glycemic control for   <7.0% adults with diabetes     CBG: Recent Labs  Lab 06/21/22 2243  GLUCAP 92    Review of Systems:   Unable to be obtained secondary to the patient's altered mental status   Past Medical History  She,  has a past medical history of Anemia, Arthritis, Chronic pain, Complication of anesthesia, Dyspnea, GERD (gastroesophageal reflux disease), History of kidney stones, Hypertension, Osteoporosis, Pneumonia, PONV (postoperative nausea and vomiting), and Stevens-Johnson syndrome (HCC).   Surgical History    Past Surgical History:  Procedure Laterality Date   ABDOMINAL HYSTERECTOMY     partial   BACK SURGERY     CYSTOSCOPY WITH STENT PLACEMENT Right 04/13/2020   Procedure: CYSTOSCOPY WITH STENT PLACEMENT;  Surgeon: Sondra Come, MD;  Location: [facility] ORS;  Service: Urology;   Laterality: Right;   CYSTOSCOPY/URETEROSCOPY/HOLMIUM LASER/STENT PLACEMENT Right 04/27/2020   Procedure: CYSTOSCOPY/URETEROSCOPY/HOLMIUM LASER/STENT EXCHANGE;  Surgeon: Sondra Come, MD;  Location: [facility] OR[facility];  Service: Urology;  Laterality: Right;   ESOPHAGOGASTRODUODENOSCOPY N/A 06/30/2019   Procedure: ESOPHAGOGASTRODUODENOSCOPY (EGD);  Surgeon: Toledo, Boykin Nearing, MD;  Location: [facility];  Service: Gastroenterology;  Laterality: N/A;   FOOT SURGERY     FRACTURE SURGERY     left hip repair   HAND SURGERY     THUMB REPAIR   HEMORRHOID SURGERY     INTRAMEDULLARY (IM) NAIL INTERTROCHANTERIC Left 08/06/2017   Procedure: INTRAMEDULLARY (IM) NAIL INTERTROCHANTRIC;  Surgeon: Signa Kell, MD;  Location: [facility] ORS;  Service: Orthopedics;  Laterality: Left;   POSTERIOR REPAIR       Social History   reports that she quit smoking about 36 years ago. Her smoking use included cigarettes. She has never used smokeless tobacco. She reports that she does not drink alcohol and does not use drugs.   Family History   Her family history includes Alcohol abuse in her father;  Diabetes in her paternal grandmother; Stomach cancer in her maternal aunt; Uterine cancer in her maternal aunt. There is no history of Breast cancer, Ovarian cancer, Colon cancer, or Heart disease.   Allergies Allergies  Allergen Reactions   Codeine Nausea And Vomiting   Sulfa Antibiotics Other (See Comments)    STEVEN JOHNSON SYNDROME     Home Medications  Prior to Admission medications   Medication Sig Start Date End Date Taking? Authorizing Provider  acetaminophen (TYLENOL) 325 MG tablet Take 2 tablets (650 mg total) by mouth every 6 (six) hours. 11/08/20  Yes Leeroy Bock, MD  donepezil (ARICEPT) 10 MG tablet Take 10 mg by mouth daily. 10/08/20  Yes [provider]  escitalopram (LEXAPRO) 5 MG tablet escitalopram 5 mg tablet  TAKE 1 TABLET BY MOUTH EVERY DAY   Yes [provider]  Melatonin 10 MG  TABS Take 10 mg by mouth at bedtime.   Yes [provider]  ondansetron (ZOFRAN) 4 MG tablet Take 4 mg by mouth every 8 (eight) hours as needed for nausea or vomiting.   Yes [provider]  Oxycodone HCl 10 MG TABS Take 1 tablet (10 mg total) by mouth 3 (three) times daily as needed. 11/08/20  Yes Leeroy Bock, MD  senna (SENOKOT) 8.6 MG TABS tablet Take 2 tablets (17.2 mg total) by mouth daily. 11/08/20  Yes Leeroy Bock, MD  Cholecalciferol (VITAMIN D) 2000 units CAPS Take 5,000 Units by mouth daily. Patient not taking: Reported on 11/30/2020    [provider]  lisinopril (ZESTRIL) 20 MG tablet Take 1 tablet by mouth daily. Patient not taking: Reported on 06/21/2022    [provider]  Multiple Vitamin (MULTIVITAMIN) tablet Take 1 tablet by mouth daily. Patient not taking: Reported on 11/30/2020    [provider]  polyethylene glycol (MIRALAX / GLYCOLAX) 17 g packet Take 17 g by mouth daily. Patient not taking: Reported on 06/21/2022 11/08/20   Leeroy Bock, MD    Scheduled Meds:  heparin  5,000 Units Subcutaneous Q8H   Continuous Infusions:  sodium chloride 20 mL/hr at 06/22/22 0000   amiodarone 60 mg/hr (06/22/22 0006)   amiodarone     azithromycin     cefTRIAXone (ROCEPHIN)  IV     lactated ringers 125 mL/hr at 06/22/22 0000   norepinephrine (LEVOPHED) Adult infusion Stopped (06/21/22 2255)   PRN Meds:.docusate sodium, polyethylene glycol   Active Hospital Problem list     Assessment & Plan:  #Severe Sepsis w/shock due to suspected UTI +/- Pneumonia Initial interventions/workup included: 4 L of NS/LR & Cefepime/ Vancomycin/ meets SIRS criteria: Heart Rate 121 beats/minute, Respiratory Rate 33 breaths/minute,Temperature 100.0 -Supplemental oxygen as needed, to maintain SpO2 > 90% -F/u cultures, trend lactic/ PCT -Monitor WBC/ fever curve -IV antibiotic Ceftriaxone and Azithromycin -IVF hydration as  needed -Pressors for MAP goal >65 -Strict I/O's  #New onset AFib+RVR Elevated Troponin likely demand ischemia in the setting of Afib -Serial EKGs -Trend Troponins -TSH, FT4 -TTEcho -Start Amiodarone 150 mg IV bolus, then 1mg /min x6 hrs, then 0.5mg /min for 18 hours  -Heparin as above -Cardiology Consult if appropriate    #Acute metabolic encephalopathy in the setting of sepsis Hx of Dementia -Treat underlying sepsis -Avoid sedatives as able -Supportive care   # AKI likely ATN in the setting of above # Hypokalemia -Trend Lactate -Monitor I&O's / urinary output -Follow BMP -Ensure adequate renal perfusion -Avoid nephrotoxic agents as able -Replace electrolytes as indicated   #  Mild Transaminitis ?early ischemic.  -Likely Shock Liver - trend    Best practice:  Diet:  Oral Pain/Anxiety/Delirium protocol (if indicated): No VAP protocol (if indicated): Not indicated DVT prophylaxis: Subcutaneous Heparin GI prophylaxis: H2B Glucose control:  SSI No Central venous access:  N/A Arterial line:  N/A Foley:  Yes, and it is still needed Mobility:  bed rest  PT consulted: N/A Last date of multidisciplinary goals of care discussion 06/21/22 Code Status:  full code Disposition: ICU   = Goals of Care = Code Status Order: FULL   Primary Emergency Contact: River,Brenda Wishes to pursue full aggressive treatment and intervention options, including CPR and intubation, but goals of care will be addressed on going with family if that should become necessary.   Critical care time: 45 minutes        Webb Silversmith DNP, CCRN, FNP-C, AGACNP-BC Acute Care & Family Nurse Practitioner [facility] PCCM on call pager [phone]

## 2022-06-21 NOTE — ED Triage Notes (Signed)
Patient comes in via [facility] from home with suspected sepsis. According to EMS, the family reported that the patient has been altered since this morning., and that it continues to progress. Pt is alert, and responsive to her name. Pt has a history of hypertension, and depression. Patient is with no signs of distress at this time.

## 2022-06-21 NOTE — ED Provider Notes (Signed)
[facility] Provider Note    Event Date/Time   First MD Initiated Contact with Patient 06/21/22 1607     (approximate)   History   Altered Mental Status   HPI  TIA BEDWELL is a 77 y.o. female who presents to the emergency department today because of concerns for altered mental status.  Patient is unable to give any history.  Per report family noticed that patient was altered this morning.  It then progressed throughout the day.      Physical Exam   Triage Vital Signs: ED Triage Vitals  Enc Vitals Group     BP 06/21/22 1607 (!) 76/54     Pulse Rate 06/21/22 1607 (!) 120     Resp 06/21/22 1607 (!) 21     Temp 06/21/22 1607 97.7 F (36.5 C)     Temp Source 06/21/22 1607 Oral     SpO2 06/21/22 1607 94 %     Weight 06/21/22 1610 128 lb 12 oz (58.4 kg)     Height 06/21/22 1610 5\' 10"  (1.778 m)     Head Circumference --      Peak Flow --      Pain Score --      Pain Loc --      Pain Edu? --      Excl. in GC? --     Most recent vital signs: Vitals:   06/21/22 1607  BP: (!) 76/54  Pulse: (!) 120  Resp: (!) 21  Temp: 97.7 F (36.5 C)  SpO2: 94%   General: Awake, alert, only oriented to self CV:  Good peripheral perfusion. Tachycardia. Resp:  Normal effort. Lungs clear. Abd:  No distention.  Other:  Dry mucous membranes.    ED Results / Procedures / Treatments   Labs (all labs ordered are listed, but only abnormal results are displayed) Labs Reviewed  LACTIC ACID, PLASMA - Abnormal; Notable for the following components:      Result Value   Lactic Acid, Venous 4.4 (*)    All other components within normal limits  COMPREHENSIVE METABOLIC PANEL - Abnormal; Notable for the following components:   Potassium 2.9 (*)    BUN 64 (*)    Creatinine, Ser 3.07 (*)    Calcium 8.3 (*)    AST 46 (*)    Alkaline Phosphatase 134 (*)    Total Bilirubin 1.3 (*)    GFR, Estimated 15 (*)    All other components within normal limits  CBC  WITH DIFFERENTIAL/PLATELET - Abnormal; Notable for the following components:   RBC 3.83 (*)    HCT 35.7 (*)    Platelets 43 (*)    Lymphs Abs 0.2 (*)    Abs Immature Granulocytes 1.09 (*)    All other components within normal limits  RESP PANEL BY RT-PCR (RSV, FLU A&B, COVID)  RVPGX2  CULTURE, BLOOD (ROUTINE X 2)  CULTURE, BLOOD (ROUTINE X 2)  LACTIC ACID, PLASMA  URINALYSIS, W/ REFLEX TO CULTURE (INFECTION SUSPECTED)     EKG  I, Phineas Semen, attending physician, personally viewed and interpreted this EKG  EKG Time: 1600 Rate: 123 Rhythm: sinus tachycardia Axis: left axis deviation Intervals: qtc 475 QRS: narrow ST changes: no st elevation Impression: abnormal ekg   RADIOLOGY I independently interpreted and visualized the CXR. My interpretation: No pneumonia Radiology interpretation:  IMPRESSION:  1. Low lung volumes, with patchy bibasilar consolidation consistent  with atelectasis or airspace disease.  2. Enlarged cardiac silhouette.  PROCEDURES:  Critical Care performed: Yes  CRITICAL CARE Performed by: Phineas Semen   Total critical care time: 45 minutes  Critical care time was exclusive of separately billable procedures and treating other patients.  Critical care was necessary to treat or prevent imminent or life-threatening deterioration.  Critical care was time spent personally by me on the following activities: development of treatment plan with patient and/or surrogate as well as nursing, discussions with consultants, evaluation of patient's response to treatment, examination of patient, obtaining history from patient or surrogate, ordering and performing treatments and interventions, ordering and review of laboratory studies, ordering and review of radiographic studies, pulse oximetry and re-evaluation of patient's condition.   Procedures    MEDICATIONS ORDERED IN ED: Medications - No data to display   IMPRESSION / MDM / ASSESSMENT  AND PLAN / ED COURSE  I reviewed the triage vital signs and the nursing notes.                              Differential diagnosis includes, but is not limited to, pneumonia, UTI, dehydration, electrolyte abnormality, intracranial process.  Patient's presentation is most consistent with acute presentation with potential threat to life or bodily function.   The patient is on the cardiac monitor to evaluate for evidence of arrhythmia and/or significant heart rate changes.  Patient presented to the emergency department today because of concerns for altered mental status.  Upon arrival to the emergency department the patient was found to be significantly hypotensive.  She was oriented to self only.  IV fluids were started.  Blood work was concerning for elevated lactic acid level.  Also was concerning for acute kidney injury and hypokalemia.  Chest x-ray without obvious pneumonia.  Urinalysis is concerning for UTI.  Patient had been given multiple broad-spectrum IV antibiotics.  Patient was given multiple IV fluid boluses.  On reassessment this did help improve her blood pressure and MAP however on repeat lactic acid level was worse.  Given worsening lactic acid level discussed with provider O[place] with the ICU team who will plan on admission.    FINAL CLINICAL IMPRESSION(S) / ED DIAGNOSES   Final diagnoses:  Altered mental status, unspecified altered mental status type  AKI (acute kidney injury) (HCC)  Lower urinary tract infectious disease  Lactic acidosis       Note:  This document was prepared using Dragon voice recognition software and may include unintentional dictation errors.    Phineas Semen, MD 06/21/22 2116

## 2022-06-21 NOTE — ED Notes (Signed)
Spoke with patients sister and updated her on the patients care plan. Sister informed me that she is unable to drive, and therefore can't come to visit the patient at this time.

## 2022-06-22 LAB — BASIC METABOLIC PANEL
Anion gap: 12 (ref 5–15)
BUN: 64 mg/dL — ABNORMAL HIGH (ref 8–23)
CO2: 20 mmol/L — ABNORMAL LOW (ref 22–32)
Calcium: 8 mg/dL — ABNORMAL LOW (ref 8.9–10.3)
Chloride: 104 mmol/L (ref 98–111)
Creatinine, Ser: 2.68 mg/dL — ABNORMAL HIGH (ref 0.44–1.00)
GFR, Estimated: 18 mL/min — ABNORMAL LOW (ref >60)
Glucose, Bld: 93 mg/dL (ref 70–99)
Potassium: 4.1 mmol/L (ref 3.5–5.1)
Sodium: 136 mmol/L (ref 135–145)

## 2022-06-22 LAB — BLOOD CULTURE ID PANEL (REFLEXED) - BCID2

## 2022-06-22 LAB — CBC
HCT: 27 % — ABNORMAL LOW (ref 36.0–46.0)
Hemoglobin: 9.3 g/dL — ABNORMAL LOW (ref 12.0–15.0)
MCH: 31.6 pg (ref 26.0–34.0)
MCHC: 34.4 g/dL (ref 30.0–36.0)
MCV: 91.8 fL (ref 80.0–100.0)
Platelets: UNDETERMINED 10*3/uL (ref 150–400)
RBC: 2.94 MIL/uL — ABNORMAL LOW (ref 3.87–5.11)
RDW: 15 % (ref 11.5–15.5)
WBC: 19.8 10*3/uL — ABNORMAL HIGH (ref 4.0–10.5)
nRBC: 0 % (ref 0.0–0.2)

## 2022-06-22 LAB — MAGNESIUM: Magnesium: 1.2 mg/dL — ABNORMAL LOW (ref 1.7–2.4)

## 2022-06-22 LAB — STREP PNEUMONIAE URINARY ANTIGEN: Strep Pneumo Urinary Antigen: NEGATIVE

## 2022-06-22 LAB — PHOSPHORUS: Phosphorus: 3.9 mg/dL (ref 2.5–4.6)

## 2022-06-22 LAB — CULTURE, BLOOD (ROUTINE X 2)

## 2022-06-22 LAB — TROPONIN I (HIGH SENSITIVITY): Troponin I (High Sensitivity): 91 ng/L — ABNORMAL HIGH (ref <18)

## 2022-06-22 LAB — LACTIC ACID, PLASMA: Lactic Acid, Venous: 2.7 mmol/L (ref 0.5–1.9)

## 2022-06-22 LAB — MRSA NEXT GEN BY PCR, NASAL: MRSA by PCR Next Gen: DETECTED — AB

## 2022-06-22 MED ORDER — CHLORHEXIDINE GLUCONATE CLOTH 2 % EX PADS
6.0000 | MEDICATED_PAD | Freq: Every day | CUTANEOUS | Status: AC
  Administered 2022-06-21 – 2022-06-26 (×5): 6 via TOPICAL

## 2022-06-22 MED ORDER — MAGNESIUM SULFATE 4 GM/100ML IV SOLN
4.0000 g | Freq: Once | INTRAVENOUS | Status: AC
  Administered 2022-06-22: 4 g via INTRAVENOUS
  Filled 2022-06-22: qty 100

## 2022-06-22 MED ORDER — MUPIROCIN 2 % EX OINT
1.0000 | TOPICAL_OINTMENT | Freq: Two times a day (BID) | CUTANEOUS | Status: AC
  Administered 2022-06-22 – 2022-06-26 (×10): 1 via NASAL
  Filled 2022-06-22: qty 22

## 2022-06-22 NOTE — Progress Notes (Signed)
   CHIEF COMPLAINT:   Chief Complaint  Patient presents with   Altered Mental Status    Subjective  Admitted for severe sepsis  e coli bacteremia More alert and awake Responded well to IVF's Did NOT start pressors      Objective   Examination:  General exam: Appears calm and comfortable  Respiratory system: Clear to auscultation. Respiratory effort normal. HEENT: [place]/AT, PERRLA, no thrush, no stridor. Cardiovascular system: S1 & S2 heard, RRR. No JVD, murmurs, rubs, gallops or clicks. No pedal edema. Gastrointestinal system: Abdomen is nondistended, soft and nontender. No organomegaly or masses felt. Normal bowel sounds heard. Central nervous system: Alert and oriented. No focal neurological deficits. Extremities: Symmetric 5 x 5 power. Skin: No rashes, lesions or ulcers   VITALS:  height is 5\' 10"  (1.778 m) and weight is 58.4 kg. Her temperature is 100 F (37.8 C). Her blood pressure is 140/85 (abnormal) and her pulse is 81. Her respiration is 25 (abnormal) and oxygen saturation is 97%.   I personally reviewed Labs under Results section.  Radiology Reports DG Chest Port 1 View  Result Date: 06/21/2022 CLINICAL DATA:  Sepsis EXAM: PORTABLE CHEST 1 VIEW COMPARISON:  06/10/2018 FINDINGS: 2 frontal views of the chest demonstrate an enlarged cardiac silhouette. There is patchy bibasilar consolidation, which could reflect atelectasis or airspace disease. Lung volumes are diminished. No effusion or pneumothorax. No acute bony abnormality. IMPRESSION: 1. Low lung volumes, with patchy bibasilar consolidation consistent with atelectasis or airspace disease. 2. Enlarged cardiac silhouette. Electronically Signed   By: Sharlet Salina M.D.   On: 06/21/2022 16:58       Assessment/Plan:  SEVERE SEPSIS E COLI BACTEREMIA IV ABX IVF's  NOT on pressors, not on vent FLow care status Transfer to [facility]     Lucie Leather, M.D.  [facility]  Medical  Director [facility] Medical Director [facility] Cardio-Pulmonary Department

## 2022-06-22 NOTE — Progress Notes (Signed)
eLink Physician-Brief Progress Note Patient Name: Alison Price DOB: 1945-07-13 MRN: [id number]   Date of Service  06/22/2022  HPI/Events of Note  78 yr old female with dementia, HTN, recurrent falls presents presented with altered mental status.  Found to be hypotensive with acute renal insuff and new afib.  Treated for sepsis from UTI and/or pna.  On amiodarone for afib.    eICU Interventions  Chart reviewed     Intervention Category Evaluation Type: New Patient Evaluation  Henry Russel, P 06/22/2022, 3:06 AM

## 2022-06-22 NOTE — Consult Note (Addendum)
PHARMACY CONSULT NOTE - FOLLOW UP  Pharmacy Consult for Electrolyte Monitoring and Replacement   Recent Labs: Potassium (mmol/L)  Date Value  06/22/2022 4.1  05/04/2013 3.7   Magnesium (mg/dL)  Date Value  16/11/9602 1.2 (L)  04/20/2011 1.9   Calcium (mg/dL)  Date Value  54/10/8117 8.0 (L)   Calcium, Total (mg/dL)  Date Value  14/78/2956 8.9   Albumin (g/dL)  Date Value  21/30/8657 3.6  01/06/2013 3.6   Phosphorus (mg/dL)  Date Value  84/69/6295 3.9   Sodium (mmol/L)  Date Value  06/22/2022 136  04/19/2011 139    Assessment: 77yo female patient with PMH of amenia, dyspnea, GERD, HTN, and Stevens-Johnson syndrome, presents to ED with AMS, hypotension, and possible sepsis. Pharmacy consulted to manage electrolytes as part of CCM. On amiodarone.   On LR @ 125 ml/hr.   Goal of Therapy:  K+ 4 Mg > 2  Plan:  Mg 4 g IV x 1 F/u with AM labs.    Paschal Dopp, PharmD, BCPS 06/22/2022 8:10 AM

## 2022-06-22 NOTE — Plan of Care (Signed)
  Problem: Elimination: Goal: Will not experience complications related to urinary retention Outcome: Progressing   Problem: Safety: Goal: Ability to remain free from injury will improve Outcome: Progressing   Problem: Skin Integrity: Goal: Risk for impaired skin integrity will decrease Outcome: Progressing   

## 2022-06-22 NOTE — Progress Notes (Signed)
PHARMACY - PHYSICIAN COMMUNICATION CRITICAL VALUE ALERT - BLOOD CULTURE IDENTIFICATION (BCID)  Results for orders placed or performed during the hospital encounter of 06/21/22  Blood Culture (routine x 2)     Status: None (Preliminary result)   Collection Time: 06/21/22  4:00 PM   Specimen: BLOOD  Result Value Ref Range Status   Specimen Description BLOOD BLOOD RIGHT FOREARM  Final   Special Requests   Final    BOTTLES DRAWN AEROBIC AND ANAEROBIC Blood Culture adequate volume   Culture  Setup Time   Final    ANAEROBIC BOTTLE ONLY GRAM NEGATIVE RODS Organism ID to follow CRITICAL RESULT CALLED TO, READ BACK BY AND VERIFIED WITH: Cordarro Spinnato @ 0519 06/22/22 LFD Performed at [facility], [address] [place], [place] [address]    Culture GRAM NEGATIVE RODS  Final   Report Status PENDING  Incomplete  Blood Culture ID Panel (Reflexed)     Status: Abnormal   Collection Time: 06/21/22  4:00 PM  Result Value Ref Range Status   Enterococcus faecalis NOT DETECTED NOT DETECTED Final   Enterococcus Faecium NOT DETECTED NOT DETECTED Final   Listeria monocytogenes NOT DETECTED NOT DETECTED Final   Staphylococcus species NOT DETECTED NOT DETECTED Final   Staphylococcus aureus (BCID) NOT DETECTED NOT DETECTED Final   Staphylococcus epidermidis NOT DETECTED NOT DETECTED Final   Staphylococcus lugdunensis NOT DETECTED NOT DETECTED Final   Streptococcus species NOT DETECTED NOT DETECTED Final   Streptococcus agalactiae NOT DETECTED NOT DETECTED Final   Streptococcus pneumoniae NOT DETECTED NOT DETECTED Final   Streptococcus pyogenes NOT DETECTED NOT DETECTED Final   A.calcoaceticus-baumannii NOT DETECTED NOT DETECTED Final   Bacteroides fragilis NOT DETECTED NOT DETECTED Final   Enterobacterales DETECTED (A) NOT DETECTED Final    Comment: Enterobacterales represent a large order of gram negative bacteria, not a single organism. CRITICAL RESULT CALLED TO, READ BACK BY AND VERIFIED  WITH: Liani Caris @ 0519 06/22/22 LFD    Enterobacter cloacae complex NOT DETECTED NOT DETECTED Final   Escherichia coli DETECTED (A) NOT DETECTED Final    Comment: CRITICAL RESULT CALLED TO, READ BACK BY AND VERIFIED WITH: Lashawnta Burgert @ 0519 06/22/22 LFD    Klebsiella aerogenes NOT DETECTED NOT DETECTED Final   Klebsiella oxytoca NOT DETECTED NOT DETECTED Final   Klebsiella pneumoniae NOT DETECTED NOT DETECTED Final   Proteus species NOT DETECTED NOT DETECTED Final   Salmonella species NOT DETECTED NOT DETECTED Final   Serratia marcescens NOT DETECTED NOT DETECTED Final   Haemophilus influenzae NOT DETECTED NOT DETECTED Final   Neisseria meningitidis NOT DETECTED NOT DETECTED Final   Pseudomonas aeruginosa NOT DETECTED NOT DETECTED Final   Stenotrophomonas maltophilia NOT DETECTED NOT DETECTED Final   Candida albicans NOT DETECTED NOT DETECTED Final   Candida auris NOT DETECTED NOT DETECTED Final   Candida glabrata NOT DETECTED NOT DETECTED Final   Candida krusei NOT DETECTED NOT DETECTED Final   Candida parapsilosis NOT DETECTED NOT DETECTED Final   Candida tropicalis NOT DETECTED NOT DETECTED Final   Cryptococcus neoformans/gattii NOT DETECTED NOT DETECTED Final   CTX-M ESBL NOT DETECTED NOT DETECTED Final   Carbapenem resistance IMP NOT DETECTED NOT DETECTED Final   Carbapenem resistance KPC NOT DETECTED NOT DETECTED Final   Carbapenem resistance NDM NOT DETECTED NOT DETECTED Final   Carbapenem resist OXA 48 LIKE NOT DETECTED NOT DETECTED Final   Carbapenem resistance VIM NOT DETECTED NOT DETECTED Final    Comment: Performed at [facility], [address] [place], [place] [address]  Blood Culture (routine x 2)     Status: None (Preliminary result)   Collection Time: 06/21/22  4:01 PM   Specimen: BLOOD  Result Value Ref Range Status   Specimen Description BLOOD RIGHT ANTECUBITAL  Final   Special Requests   Final    BOTTLES DRAWN AEROBIC AND ANAEROBIC Blood  Culture results may not be optimal due to an excessive volume of blood received in culture bottles   Culture  Setup Time   Final    GRAM NEGATIVE RODS ANAEROBIC BOTTLE ONLY CRITICAL RESULT CALLED TO, READ BACK BY AND VERIFIED WITHDawayne Cirri 6045 06/22/22 LFD Performed at [facility], [address] [place], [place] [address]    Culture GRAM NEGATIVE RODS  Final   Report Status PENDING  Incomplete  Resp panel by RT-PCR (RSV, Flu A&B, Covid)     Status: None   Collection Time: 06/21/22  4:01 PM  Result Value Ref Range Status   SARS Coronavirus 2 by RT PCR NEGATIVE NEGATIVE Final    Comment: (NOTE) SARS-CoV-2 target nucleic acids are NOT DETECTED.  The SARS-CoV-2 RNA is generally detectable in upper respiratory specimens during the acute phase of infection. The lowest concentration of SARS-CoV-2 viral copies this assay can detect is 138 copies/mL. A negative result does not preclude SARS-Cov-2 infection and should not be used as the sole basis for treatment or other patient management decisions. A negative result may occur with  improper specimen collection/handling, submission of specimen other than nasopharyngeal swab, presence of viral mutation(s) within the areas targeted by this assay, and inadequate number of viral copies(<138 copies/mL). A negative result must be combined with clinical observations, patient history, and epidemiological information. The expected result is Negative.  Fact Sheet for Patients:  BloggerCourse.com  Fact Sheet for Healthcare Providers:  SeriousBroker.it  This test is no t yet approved or cleared by the [place] FDA and  has been authorized for detection and/or diagnosis of SARS-CoV-2 by FDA under an Emergency Use Authorization (EUA). This EUA will remain  in effect (meaning this test can be used) for the duration of the COVID-19 declaration under Section 564(b)(1) of the Act,  21 U.S.C.section 360bbb-3(b)(1), unless the authorization is terminated  or revoked sooner.       Influenza A by PCR NEGATIVE NEGATIVE Final   Influenza B by PCR NEGATIVE NEGATIVE Final    Comment: (NOTE) The Xpert Xpress SARS-CoV-2/FLU/RSV plus assay is intended as an aid in the diagnosis of influenza from Nasopharyngeal swab specimens and should not be used as a sole basis for treatment. Nasal washings and aspirates are unacceptable for Xpert Xpress SARS-CoV-2/FLU/RSV testing.  Fact Sheet for Patients: BloggerCourse.com  Fact Sheet for Healthcare Providers: SeriousBroker.it  This test is not yet approved or cleared by the [place] FDA and has been authorized for detection and/or diagnosis of SARS-CoV-2 by FDA under an Emergency Use Authorization (EUA). This EUA will remain in effect (meaning this test can be used) for the duration of the COVID-19 declaration under Section 564(b)(1) of the Act, 21 U.S.C. section 360bbb-3(b)(1), unless the authorization is terminated or revoked.     Resp Syncytial Virus by PCR NEGATIVE NEGATIVE Final    Comment: (NOTE) Fact Sheet for Patients: BloggerCourse.com  Fact Sheet for Healthcare Providers: SeriousBroker.it  This test is not yet approved or cleared by the [place] FDA and has been authorized for detection and/or diagnosis of SARS-CoV-2 by FDA under an  Emergency Use Authorization (EUA). This EUA will remain in effect (meaning this test can be used) for the duration of the COVID-19 declaration under Section 564(b)(1) of the Act, 21 U.S.C. section 360bbb-3(b)(1), unless the authorization is terminated or revoked.  Performed at [facility], [address] [place], [place] [address]   MRSA Next Gen by PCR, Nasal     Status: Abnormal   Collection Time: 06/21/22 11:02 PM   Specimen: Nasal Mucosa; Nasal Swab   Result Value Ref Range Status   MRSA by PCR Next Gen DETECTED (A) NOT DETECTED Final    Comment: CRITICAL RESULT CALLED TO, READ BACK BY AND VERIFIED WITH: SCRIVNER,IVAN RN @ 0210 06/22/22 LFD Performed at [facility], [address] [place], [place] [address]     BCID Results: 2 (both anaerobic) bottles of 4 with E. Coli, no resistance. Pt already on Ceftriaxone 2 gm for UTI and Azithromycin for CAP.  Name of provider contacted: Reyes Ivan, NP   Changes to prescribed antibiotics required: No changes at this time.  Otelia Sergeant, PharmD, MBA 06/22/2022 5:22 AM

## 2022-06-23 ENCOUNTER — Inpatient Hospital Stay: Payer: Medicare Other

## 2022-06-23 DIAGNOSIS — R6520 Severe sepsis without septic shock: Secondary | ICD-10-CM | POA: Diagnosis not present

## 2022-06-23 DIAGNOSIS — E872 Acidosis, unspecified: Secondary | ICD-10-CM | POA: Diagnosis not present

## 2022-06-23 DIAGNOSIS — A419 Sepsis, unspecified organism: Secondary | ICD-10-CM | POA: Diagnosis not present

## 2022-06-23 LAB — CBC
HCT: 29.4 % — ABNORMAL LOW (ref 36.0–46.0)
Hemoglobin: 9.7 g/dL — ABNORMAL LOW (ref 12.0–15.0)
MCH: 30.8 pg (ref 26.0–34.0)
MCHC: 33 g/dL (ref 30.0–36.0)
MCV: 93.3 fL (ref 80.0–100.0)
Platelets: 33 10*3/uL — ABNORMAL LOW (ref 150–400)
RBC: 3.15 MIL/uL — ABNORMAL LOW (ref 3.87–5.11)
RDW: 14.9 % (ref 11.5–15.5)
WBC: 22.2 10*3/uL — ABNORMAL HIGH (ref 4.0–10.5)
nRBC: 0 % (ref 0.0–0.2)

## 2022-06-23 LAB — BASIC METABOLIC PANEL
Anion gap: 14 (ref 5–15)
BUN: 69 mg/dL — ABNORMAL HIGH (ref 8–23)
CO2: 19 mmol/L — ABNORMAL LOW (ref 22–32)
Calcium: 8 mg/dL — ABNORMAL LOW (ref 8.9–10.3)
Chloride: 106 mmol/L (ref 98–111)
Creatinine, Ser: 2.25 mg/dL — ABNORMAL HIGH (ref 0.44–1.00)
GFR, Estimated: 22 mL/min — ABNORMAL LOW (ref >60)
Glucose, Bld: 81 mg/dL (ref 70–99)
Potassium: 4.1 mmol/L (ref 3.5–5.1)
Sodium: 139 mmol/L (ref 135–145)

## 2022-06-23 LAB — CULTURE, BLOOD (ROUTINE X 2)

## 2022-06-23 LAB — URINE CULTURE

## 2022-06-23 MED ORDER — MELATONIN 10 MG PO TABS: 10.0000 mg | ORAL_TABLET | Freq: Every day | ORAL | Status: DC

## 2022-06-23 MED ORDER — ACETAMINOPHEN 325 MG PO TABS
650.0000 mg | ORAL_TABLET | Freq: Four times a day (QID) | ORAL | Status: DC | PRN
  Administered 2022-06-28: 650 mg via ORAL
  Filled 2022-06-23: qty 2

## 2022-06-23 MED ORDER — ONDANSETRON HCL 4 MG PO TABS: 4.0000 mg | ORAL_TABLET | Freq: Three times a day (TID) | ORAL | Status: DC | PRN

## 2022-06-23 MED ORDER — SENNOSIDES-DOCUSATE SODIUM 8.6-50 MG PO TABS
1.0000 | ORAL_TABLET | Freq: Every day | ORAL | Status: DC
  Administered 2022-06-23 – 2022-06-30 (×8): 1 via ORAL
  Filled 2022-06-23 (×8): qty 1

## 2022-06-23 MED ORDER — ESCITALOPRAM OXALATE 10 MG PO TABS: 5.0000 mg | ORAL_TABLET | Freq: Every day | ORAL | Status: DC

## 2022-06-23 MED ORDER — ADULT MULTIVITAMIN W/MINERALS CH
1.0000 | ORAL_TABLET | Freq: Every day | ORAL | Status: DC
  Administered 2022-06-23 – 2022-06-30 (×8): 1 via ORAL
  Filled 2022-06-23 (×8): qty 1

## 2022-06-23 MED ORDER — SENNOSIDES-DOCUSATE SODIUM 8.6-50 MG PO TABS: 1.0000 | ORAL_TABLET | Freq: Every evening | ORAL | Status: DC | PRN

## 2022-06-23 MED ORDER — MELATONIN 5 MG PO TABS
10.0000 mg | ORAL_TABLET | Freq: Every day | ORAL | Status: DC
  Administered 2022-06-23 – 2022-06-29 (×7): 10 mg via ORAL
  Filled 2022-06-23 (×7): qty 2

## 2022-06-23 MED ORDER — ESCITALOPRAM OXALATE 10 MG PO TABS
5.0000 mg | ORAL_TABLET | Freq: Every day | ORAL | Status: DC
  Administered 2022-06-23 – 2022-06-30 (×8): 5 mg via ORAL
  Filled 2022-06-23 (×8): qty 1

## 2022-06-23 MED ORDER — DONEPEZIL HCL 5 MG PO TABS
10.0000 mg | ORAL_TABLET | Freq: Every day | ORAL | Status: DC
  Administered 2022-06-23 – 2022-06-29 (×7): 10 mg via ORAL
  Filled 2022-06-23 (×7): qty 2

## 2022-06-23 MED ORDER — OXYCODONE HCL 5 MG PO TABS: 10.0000 mg | ORAL_TABLET | Freq: Three times a day (TID) | ORAL | Status: DC | PRN

## 2022-06-23 MED ORDER — VITAMIN D 25 MCG (1000 UNIT) PO TABS
2000.0000 [IU] | ORAL_TABLET | Freq: Every day | ORAL | Status: DC
  Administered 2022-06-23 – 2022-06-30 (×8): 2000 [IU] via ORAL
  Filled 2022-06-23 (×8): qty 2

## 2022-06-23 MED ORDER — FERROUS SULFATE 325 (65 FE) MG PO TABS
324.0000 mg | ORAL_TABLET | Freq: Every day | ORAL | Status: DC
  Administered 2022-06-24 – 2022-06-30 (×7): 324 mg via ORAL
  Filled 2022-06-23 (×7): qty 1

## 2022-06-23 NOTE — TOC Initial Note (Signed)
Transition of Care [facility]) - Initial/Assessment Note    Patient Details  Name: Alison Price MRN: [id number] Date of Birth: Jan 07, 1946  [facility]) CM/SW Contact:    Allena Katz, LCSW Phone Number: 06/23/2022, 10:53 AM  Clinical Narrative:    Pt admitted with Severe sepsis and AMS. TOC following for discharge plans and needs. PT ordered for patient. CSW will discharge plan accordingly once PT recommendation is in for patient.                      Patient Goals and CMS Choice            Expected Discharge Plan and Services                                              Prior Living Arrangements/Services                       Activities of Daily Living      Permission Sought/Granted                  Emotional Assessment              Admission diagnosis:  Lactic acidosis [E87.20] Lower urinary tract infectious disease [N39.0] AKI (acute kidney injury) (HCC) [N17.9] Altered mental status, unspecified altered mental status type [R41.82] Severe sepsis with lactic acidosis (HCC) [A41.9, R65.20, E87.20] Patient Active Problem List   Diagnosis Date Noted   Severe sepsis with lactic acidosis (HCC) 06/21/2022   Wrist fracture 11/03/2020   Right patella fracture 11/01/2020   Left ulnar fracture 11/01/2020   Left radial fracture 11/01/2020   Fall 11/01/2020   Sepsis (HCC)    Acute pyelonephritis 04/13/2020   Obstructive uropathy 04/13/2020   Normocytic anemia 04/13/2020   Depression    Chronic pain    Protein-calorie malnutrition, severe 07/01/2019   Other chronic pain    Urinary tract infection due to extended-spectrum beta lactamase (ESBL) producing Escherichia coli    Weakness    Constipation    Abdominal pain 06/29/2019   Hypomagnesemia    Gastroesophageal reflux disease without esophagitis    Hip fx (HCC) 08/05/2017   Nausea and vomiting 10/23/2016   Hypokalemia 10/23/2016   Dehydration 10/23/2016    Essential hypertension 10/23/2016   History of total abdominal hysterectomy 06/26/2015   Pelvic pressure in female 06/26/2015   Vaginal atrophy 06/26/2015   Hematuria 06/26/2015   Dysuria 06/26/2015   Pancytopenia (HCC) 04/13/2015   PCP:  Jaclyn Shaggy, MD Pharmacy:   [facility] [address] [place] [address] Phone: [phone] Fax: [phone]  [id number] - [place], [place] - [address] AT [facility] [address] [place] [address] Phone: [phone] Fax: [phone]  [facility] - [place], [place] - [address] [place] [address] Phone: [phone] Fax: [phone]     Social Determinants of Health (SDOH) Social History: SDOH Screenings   Tobacco Use: Medium Risk (11/21/2020)   SDOH Interventions:     Readmission Risk Interventions     No data to display

## 2022-06-23 NOTE — Progress Notes (Signed)
Chaplain offered Advanced Directive education to sister. Chaplain evaluated Alison Price's cognitive levels. She reports she is 89. Cannot give detailed information. Family understands Alison Price's wishes to be at home with her supportive family. Sister is supportive and appreciative of education to aid in decision making.      06/23/22 1800  Spiritual Encounters  Type of Visit Initial  Care provided to: Pt and family  Conversation partners present during encounter Nurse  Referral source Nurse (RN/NT/LPN)  Reason for visit Advance directives

## 2022-06-23 NOTE — Evaluation (Signed)
Physical Therapy Evaluation Patient Details Name: Alison Price MRN: [id number] DOB: January 21, 1946 Today's Date: 06/23/2022  History of Present Illness  Patient is a 77 yo female that presented to ED for AMS, workup for sepsis, acute renal insufficiency and afib. PMH of anemia, GERD, HTN, stevens-johnson syndrome.   Clinical Impression  Patient alert but oriented to name only. Did not speak much to PT besides, no, did not answer any questions clearly, unreliable historian. The patient was up in recliner at start of session. Unable/unwilling to stand with one person assist, maxAx2 sit <> stand, and modAx2 to step pivot to EOB, pt fearful and anxious throughout. She was able to sit EOB with min-maxA for RN to provide medication, and maxA to return to supine.   Overall the patient demonstrated deficits (see "PT Problem List") that impede the patient's functional abilities, safety, and mobility and would benefit from skilled PT intervention. Recommendation is continued skilled PT services (<3hours a day) to maximize pt function and independence.        Recommendations for follow up therapy are one component of a multi-disciplinary discharge planning process, led by the attending physician.  Recommendations may be updated based on patient status, additional functional criteria and insurance authorization.  Follow Up Recommendations Can patient physically be transported by private vehicle: No     Assistance Recommended at Discharge Frequent or constant Supervision/Assistance  Patient can return home with the following  Two people to help with walking and/or transfers;Assistance with cooking/housework;Direct supervision/assist for medications management;Assist for transportation;Help with stairs or ramp for entrance;Assistance with feeding;Two people to help with bathing/dressing/bathroom    Equipment Recommendations Other (comment) (TBD at next venue of care)  Recommendations for Other Services        Functional Status Assessment Patient has had a recent decline in their functional status and demonstrates the ability to make significant improvements in function in a reasonable and predictable amount of time.     Precautions / Restrictions Precautions Precautions: Fall Restrictions Weight Bearing Restrictions: No      Mobility  Bed Mobility Overal bed mobility: Needs Assistance Bed Mobility: Sit to Supine       Sit to supine: Max assist, +2 for safety/equipment        Transfers Overall transfer level: Needs assistance Equipment used: Rolling walker (2 wheels) Transfers: Sit to/from Stand, Bed to chair/wheelchair/BSC Sit to Stand: Max assist, +2 physical assistance   Step pivot transfers: Mod assist, +2 physical assistance            Ambulation/Gait               General Gait Details: unable  Stairs            Wheelchair Mobility    Modified Rankin (Stroke Patients Only)       Balance Overall balance assessment: Needs assistance Sitting-balance support: Feet supported Sitting balance-Leahy Scale: Poor Sitting balance - Comments: attempting to lean back/lay down throughout sitting   Standing balance support: Reliant on assistive device for balance Standing balance-Leahy Scale: Poor                               Pertinent Vitals/Pain Pain Assessment Pain Assessment: Faces Faces Pain Scale: Hurts a little bit Pain Location: with mobility Pain Descriptors / Indicators: Grimacing, Moaning Pain Intervention(s): Limited activity within patient's tolerance, Monitored during session, Repositioned    Home Living Family/patient expects to be discharged to:: Private residence  Prior Function Prior Level of Function : Patient poor historian/Family not available                     Hand Dominance        Extremity/Trunk Assessment   Upper Extremity Assessment Upper Extremity  Assessment: Difficult to assess due to impaired cognition    Lower Extremity Assessment Lower Extremity Assessment: Difficult to assess due to impaired cognition    Cervical / Trunk Assessment Cervical / Trunk Assessment: Kyphotic  Communication      Cognition Arousal/Alertness: Awake/alert Behavior During Therapy: Flat affect, Anxious Overall Cognitive Status: No family/caregiver present to determine baseline cognitive functioning                                 General Comments: pt oriented to first name only        General Comments      Exercises Other Exercises Other Exercises: Pt sat EOB with min-maxA for RN to give her medication   Assessment/Plan    PT Assessment Patient needs continued PT services  PT Problem List Decreased strength;Decreased mobility;Decreased activity tolerance;Decreased balance;Decreased knowledge of use of DME;Decreased knowledge of precautions;Decreased cognition;Decreased safety awareness       PT Treatment Interventions DME instruction;Therapeutic activities;Gait training;Therapeutic exercise;Patient/family education;Stair training;Balance training;Functional mobility training;Neuromuscular re-education    PT Goals (Current goals can be found in the Care Plan section)  Acute Rehab PT Goals PT Goal Formulation: With family Time For Goal Achievement: 07/07/22 Potential to Achieve Goals: Fair    Frequency Min 2X/week     Co-evaluation               AM-PAC PT "6 Clicks" Mobility  Outcome Measure Help needed turning from your back to your side while in a flat bed without using bedrails?: A Lot Help needed moving from lying on your back to sitting on the side of a flat bed without using bedrails?: A Lot Help needed moving to and from a bed to a chair (including a wheelchair)?: A Lot Help needed standing up from a chair using your arms (e.g., wheelchair or bedside chair)?: A Lot Help needed to walk in hospital room?: A  Lot Help needed climbing 3-5 steps with a railing? : A Lot 6 Click Score: 12    End of Session   Activity Tolerance: Other (comment) (limited by cognition) Patient left: in bed;with call bell/phone within reach;with bed alarm set Nurse Communication: Mobility status PT Visit Diagnosis: Other abnormalities of gait and mobility (R26.89);Muscle weakness (generalized) (M62.81)    Time: 6213-0865 PT Time Calculation (min) (ACUTE ONLY): 15 min   Charges:   PT Evaluation $PT Eval Low Complexity: 1 Low PT Treatments $Therapeutic Activity: 8-22 mins        Olga Coaster PT, DPT 3:50 PM,06/23/22

## 2022-06-23 NOTE — Consult Note (Signed)
PHARMACY CONSULT NOTE - FOLLOW UP  Pharmacy Consult for Electrolyte Monitoring and Replacement   Recent Labs: Potassium (mmol/L)  Date Value  06/22/2022 4.1  05/04/2013 3.7   Magnesium (mg/dL)  Date Value  16/11/9602 1.2 (L)  04/20/2011 1.9   Calcium (mg/dL)  Date Value  54/10/8117 8.0 (L)   Calcium, Total (mg/dL)  Date Value  14/78/2956 8.9   Albumin (g/dL)  Date Value  21/30/8657 3.6  01/06/2013 3.6   Phosphorus (mg/dL)  Date Value  84/69/6295 3.9   Sodium (mmol/L)  Date Value  06/22/2022 136  04/19/2011 139    Assessment: 77yo female patient with PMH of amenia, dyspnea, GERD, HTN, and Stevens-Johnson syndrome, presents to ED with AMS, hypotension, and possible sepsis. Pharmacy consulted to manage electrolytes as part of CCM.   On LR @ 125 ml/hr. Scr trending down.  Goal of Therapy:  K+ 4 Mg > 2  Plan:  No replacement warranted Pharmacy will sign off of CCM consult, as patient has been transferred out of CCU. Will continue to monitor electrolytes peripherally.  Elliot Gurney, PharmD, BCPS Clinical Pharmacist  06/23/2022 7:48 AM

## 2022-06-23 NOTE — Progress Notes (Signed)
[facility]  - [place] at [facility]   PATIENT NAME: Alison Price    MR#:  [id number]  DATE OF BIRTH:  11/11/1945  SUBJECTIVE:  patient seen earlier. Out in the recliner. Complaining of right down due to urination. No family at bedside. Called and spoke with sister Pryor Ochoa on the phone. [name] tells me patient has dementia. She lives with [name]. Very independent at home walks without a walker fixes her simple meals. Stays calm. Has episodes of depression. At baseline has some dementia.  Patient appears a bit weak. Today removing food from her mouth when fed by RN. Sipping on fluids. Afebrile. Came in with altered mental status found to have E. coli sepsis.    VITALS:  Blood pressure 120/89, pulse (!) 57, temperature 98.2 F (36.8 C), temperature source Oral, resp. rate 16, height 5\' 10"  (1.778 m), weight 58.4 kg, SpO2 96 %.  PHYSICAL EXAMINATION:   GENERAL:  77 y.o.-year-old patient with no acute distress. Dementia at baseline LUNGS: Normal breath sounds bilaterally, no wheezing CARDIOVASCULAR: S1, S2 normal. No murmur   ABDOMEN: Soft, nontender, nondistended. Bowel sounds present.  EXTREMITIES: No  edema b/l.    NEUROLOGIC: nonfocal  patient is alert and awake SKIN: No obvious rash, lesion, or ulcer.   LABORATORY PANEL:  CBC Recent Labs  Lab 06/23/22 0549  WBC 22.2*  HGB 9.7*  HCT 29.4*  PLT 33*    Chemistries  Recent Labs  Lab 06/21/22 1601 06/21/22 2301 06/22/22 0502 06/23/22 0835  NA 141   < > 136 139  K 2.9*   < > 4.1 4.1  CL 104   < > 104 106  CO2 22   < > 20* 19*  GLUCOSE 83   < > 93 81  BUN 64*   < > 64* 69*  CREATININE 3.07*   < > 2.68* 2.25*  CALCIUM 8.3*   < > 8.0* 8.0*  MG  --   --  1.2*  --   AST 46*  --   --   --   ALT 12  --   --   --   ALKPHOS 134*  --   --   --   BILITOT 1.3*  --   --   --    < > = values in this interval not displayed.    RADIOLOGY:  DG Chest Port 1 View  Result Date: 06/21/2022 CLINICAL DATA:   Sepsis EXAM: PORTABLE CHEST 1 VIEW COMPARISON:  06/10/2018 FINDINGS: 2 frontal views of the chest demonstrate an enlarged cardiac silhouette. There is patchy bibasilar consolidation, which could reflect atelectasis or airspace disease. Lung volumes are diminished. No effusion or pneumothorax. No acute bony abnormality. IMPRESSION: 1. Low lung volumes, with patchy bibasilar consolidation consistent with atelectasis or airspace disease. 2. Enlarged cardiac silhouette. Electronically Signed   By: Sharlet Salina M.D.   On: 06/21/2022 16:58    Assessment and Plan 77 y.o female with significant PMH of dementia, hypertension, GERD, arthritis, Stevens-Johnson's syndrome, recurrent falls with recent right patella and left wrist fracture who presented to the ED with chief complaints of altered mental status s   Severe sepsis with shock due to E. coli UTI/bacteremia/Pneumonia H/o Ureteral stone in 2022 (had cystoscopy with stent placement and h/o Lithotripsy) -- patient was in the ICU received IV fluids/Vasopressors -- sepsis improving -- blood culture positive for E. Coli -- continue IV Rocephin change to oral at discharge -- afebrile -- lactic acid trending down --  Pro calcitonin more than 150 -- patient currently on room air. No cough --CT renal study--f/u and consider Urology consult  Leukocytosis -- came in with white count of 4.8---19.8--22K  Tachycardia/new onset a fib in the setting of sepsis now resolved -- was on IV amiodarone drip. --Heart rate 57-70 --EKG nsr  Acute on chronic kidney disease stage IV --baseline creat 0.9--1.22 in sept 2022)-- cannot find any labs thereafter -- patient received IV fluids -- creatinine trending down  Hypomagnesimia -- received IV mag -- pharmacy to follow  Genella Rife -- continue PPI  Generalized weakness with deconditioning -- PT OT to see patient  Dementia w/o behavioral issue  Discussed at length with patient's sister [name] who is next of kin. I  have discussed with sister [name] to do the health care power of attorney paperwork. Discussed code status and overall goals of care with [name] and she is requesting DNR DNI patient lives with [name] at home. She is fairly independent. TOC for discharge planning   Procedures: Family communication :[facility] Consults :none CODE STATUS: DNR/DNI DVT Prophylaxis :Heparin Level of care: Med-Surg Status is: Inpatient Remains inpatient appropriate because: E. coli sepsis, acute on chronic renal failure. PT OT to evaluate. TOC for discharge planning. Follow-up CT renal study    TOTAL TIME TAKING CARE OF THIS PATIENT: 35 minutes.  >50% time spent on counselling and coordination of care  Note: This dictation was prepared with Dragon dictation along with smaller phrase technology. Any transcriptional errors that result from this process are unintentional.  Enedina Finner M.D    [facility]   CC: Primary care physician; Jaclyn Shaggy, MD

## 2022-06-24 ENCOUNTER — Inpatient Hospital Stay: Payer: Medicare Other

## 2022-06-24 DIAGNOSIS — N179 Acute kidney failure, unspecified: Secondary | ICD-10-CM | POA: Diagnosis not present

## 2022-06-24 DIAGNOSIS — N200 Calculus of kidney: Secondary | ICD-10-CM | POA: Diagnosis not present

## 2022-06-24 DIAGNOSIS — E872 Acidosis, unspecified: Secondary | ICD-10-CM | POA: Diagnosis not present

## 2022-06-24 DIAGNOSIS — N132 Hydronephrosis with renal and ureteral calculous obstruction: Secondary | ICD-10-CM

## 2022-06-24 DIAGNOSIS — N390 Urinary tract infection, site not specified: Secondary | ICD-10-CM | POA: Diagnosis not present

## 2022-06-24 DIAGNOSIS — R6520 Severe sepsis without septic shock: Secondary | ICD-10-CM | POA: Diagnosis not present

## 2022-06-24 DIAGNOSIS — A419 Sepsis, unspecified organism: Secondary | ICD-10-CM | POA: Diagnosis not present

## 2022-06-24 LAB — CBC
HCT: 25.9 % — ABNORMAL LOW (ref 36.0–46.0)
Hemoglobin: 8.9 g/dL — ABNORMAL LOW (ref 12.0–15.0)
MCH: 31.7 pg (ref 26.0–34.0)
MCHC: 34.4 g/dL (ref 30.0–36.0)
MCV: 92.2 fL (ref 80.0–100.0)
Platelets: 40 10*3/uL — ABNORMAL LOW (ref 150–400)
RBC: 2.81 MIL/uL — ABNORMAL LOW (ref 3.87–5.11)
RDW: 14.4 % (ref 11.5–15.5)
WBC: 11.6 10*3/uL — ABNORMAL HIGH (ref 4.0–10.5)
nRBC: 0 % (ref 0.0–0.2)

## 2022-06-24 LAB — BASIC METABOLIC PANEL
Anion gap: 11 (ref 5–15)
BUN: 68 mg/dL — ABNORMAL HIGH (ref 8–23)
CO2: 21 mmol/L — ABNORMAL LOW (ref 22–32)
Calcium: 8.2 mg/dL — ABNORMAL LOW (ref 8.9–10.3)
Chloride: 107 mmol/L (ref 98–111)
Creatinine, Ser: 1.93 mg/dL — ABNORMAL HIGH (ref 0.44–1.00)
GFR, Estimated: 27 mL/min — ABNORMAL LOW (ref >60)
Glucose, Bld: 92 mg/dL (ref 70–99)
Potassium: 3.5 mmol/L (ref 3.5–5.1)
Sodium: 139 mmol/L (ref 135–145)

## 2022-06-24 LAB — LEGIONELLA PNEUMOPHILA SEROGP 1 UR AG: L. pneumophila Serogp 1 Ur Ag: NEGATIVE

## 2022-06-24 LAB — CULTURE, BLOOD (ROUTINE X 2): Special Requests: ADEQUATE

## 2022-06-24 LAB — MAGNESIUM: Magnesium: 2.1 mg/dL (ref 1.7–2.4)

## 2022-06-24 MED ORDER — ENSURE ENLIVE PO LIQD
237.0000 mL | Freq: Two times a day (BID) | ORAL | Status: DC
  Administered 2022-06-24 – 2022-06-30 (×8): 237 mL via ORAL

## 2022-06-24 NOTE — Evaluation (Signed)
Occupational Therapy Evaluation Patient Details Name: LEDRA BECERRIL MRN: [id number] DOB: August 07, 1945 Today's Date: 06/24/2022   History of Present Illness Patient is a 77 yo female that presented to ED for AMS, workup for sepsis, acute renal insufficiency and afib. PMH of anemia, GERD, HTN, stevens-johnson syndrome.   Clinical Impression   Patient presenting with decreased cognition, Ind in self care, balance, functional mobility/transfers, endurance, and safety awareness. Patient appears restless in bed. She is oriented to self only. When asked questions about PLOF she states, "honey, I don't know". No family present to discuss PLOF but per chart report she arrived via EMS from home.  Patient becomes more anxious with bed mobility tasks. She rolls with mod A and max A for supine <>sit. Pt is unwilling to attempt stand or transfer this session. She just appears to be very fearful and confused throughout. OT providing pt with warm cloth to wash face and she perseverates on washing R hand needing OT assistance to terminate task after several minutes.  Patient will benefit from acute OT to increase overall independence in the areas of ADLs, functional mobility, and safety awareness in order to safely discharge.     Recommendations for follow up therapy are one component of a multi-disciplinary discharge planning process, led by the attending physician.  Recommendations may be updated based on patient status, additional functional criteria and insurance authorization.   Assistance Recommended at Discharge Frequent or constant Supervision/Assistance  Patient can return home with the following A lot of help with walking and/or transfers;A lot of help with bathing/dressing/bathroom;Assistance with cooking/housework;Assist for transportation;Help with stairs or ramp for entrance;Direct supervision/assist for financial management;Direct supervision/assist for medications management;Assistance with  feeding    Functional Status Assessment  Patient has had a recent decline in their functional status and demonstrates the ability to make significant improvements in function in a reasonable and predictable amount of time.  Equipment Recommendations  Other (comment) (defer to next venue of care)       Precautions / Restrictions Precautions Precautions: Fall      Mobility Bed Mobility Overal bed mobility: Needs Assistance Bed Mobility: Supine to Sit, Sit to Supine, Rolling Rolling: Mod assist   Supine to sit: Max assist Sit to supine: Max assist        Transfers                   General transfer comment: pt refuses      Balance Overall balance assessment: Needs assistance Sitting-balance support: Feet supported Sitting balance-Leahy Scale: Poor                                     ADL either performed or assessed with clinical judgement   ADL Overall ADL's : Needs assistance/impaired     Grooming: Wash/dry hands;Set up;Supervision/safety;Cueing for sequencing                                 General ADL Comments: Pt given washcloth for face washing and begins to wash R hand. She perseverates on wash R hand and needs assistance from therapist to terminate task.     Vision Patient Visual Report: No change from baseline              Pertinent Vitals/Pain Pain Assessment Pain Assessment: Faces Faces Pain Scale: Hurts a little bit Pain Location:  with mobility Pain Descriptors / Indicators: Discomfort, Aching Pain Intervention(s): Monitored during session, Repositioned     Hand Dominance Left   Extremity/Trunk Assessment Upper Extremity Assessment Upper Extremity Assessment: Difficult to assess due to impaired cognition   Lower Extremity Assessment Lower Extremity Assessment: Difficult to assess due to impaired cognition   Cervical / Trunk Assessment Cervical / Trunk Assessment: Kyphotic   Communication  Communication Communication: No difficulties   Cognition Arousal/Alertness: Awake/alert Behavior During Therapy: Restless, Anxious Overall Cognitive Status: No family/caregiver present to determine baseline cognitive functioning                                 General Comments: Pt oriented to self only.                Home Living Family/patient expects to be discharged to:: Private residence                                        Prior Functioning/Environment Prior Level of Function : Patient poor historian/Family not available               ADLs Comments: per chart review, pt lives at home but is very restless and increasingly anxious with home set up questions.        OT Problem List: Decreased activity tolerance;Decreased safety awareness;Impaired balance (sitting and/or standing);Decreased knowledge of use of DME or AE;Decreased strength;Decreased cognition      OT Treatment/Interventions: Self-care/ADL training;Therapeutic exercise;Therapeutic activities;DME and/or AE instruction;Patient/family education;Manual therapy;Balance training;Energy conservation    OT Goals(Current goals can be found in the care plan section) Acute Rehab OT Goals Patient Stated Goal: none stated OT Goal Formulation: Patient unable to participate in goal setting Time For Goal Achievement: 07/08/22 Potential to Achieve Goals: Fair ADL Goals Pt Will Perform Grooming: sitting;with supervision Pt Will Perform Lower Body Dressing: with min assist;sit to/from stand Pt Will Transfer to Toilet: with min assist;bedside commode Pt Will Perform Toileting - Clothing Manipulation and hygiene: with min assist;sit to/from stand  OT Frequency: Min 1X/week       AM-PAC OT "6 Clicks" Daily Activity     Outcome Measure Help from another person eating meals?: A Lot Help from another person taking care of personal grooming?: A Lot Help from another person toileting, which  includes using toliet, bedpan, or urinal?: Total Help from another person bathing (including washing, rinsing, drying)?: A Lot Help from another person to put on and taking off regular upper body clothing?: A Lot Help from another person to put on and taking off regular lower body clothing?: Total 6 Click Score: 10   End of Session Nurse Communication: Mobility status  Activity Tolerance: Other (comment) (limited secondary to cognition) Patient left: in bed;with call bell/phone within reach;with bed alarm set  OT Visit Diagnosis: Unsteadiness on feet (R26.81);Repeated falls (R29.6);Muscle weakness (generalized) (M62.81)                Time: 1020-1045 OT Time Calculation (min): 25 min Charges:  OT General Charges $OT Visit: 1 Visit OT Evaluation $OT Eval Moderate Complexity: 1 Mod OT Treatments $Self Care/Home Management : 8-22 mins  Jackquline Denmark, MS, OTR/L , CBIS ascom [phone]  06/24/22, 11:40 AM

## 2022-06-24 NOTE — Consult Note (Signed)
 Urology Consult  Requesting physician: Pardeep Khatri, MD  Reason for consultation: Obstructing renal calculus   History of Present Illness: Alison Price is a 77 y.o. female with PMH of dementia, hypertension, GERD followed by Dr. Sninsky for nephrolithiasis.  She underwent right ureteral stent placement for an obstructing right proximal ureteral stone with sepsis and February 2022 and subsequently underwent ureteroscopic stone removal 04/2020.  She had nonobstructing left renal calculi and last saw Dr. Sninsky October 2022.  She elected to follow these calculi and a 6-month follow-up was recommended however she did not follow-up.  She was seen in the ED 06/21/2022 with altered mental status.  She had leukocytosis and was hypotensive and admitted to the ICU with severe sepsis with a suspected UTI.  CT was not performed on admission.  Urine culture grew multiple species however blood culture positive for E. coli.  She improved clinically and was transferred out of the ICU on 06/22/2022.  A CT scan was ordered yesterday which showed an obstructing 2 cm left renal pelvic stone with moderate hydronephrosis.  There was a nonobstructing 14 mm lower pole calculus.  She has been afebrile and improving clinically and is presently on a regular diet.  History of dementia though apparently does not have a HCPOA or legal guardian.  She lives with her sister  Has had some mild left upper quadrant abdominal pain   Past Medical History:  Diagnosis Date   Anemia    Arthritis    Chronic pain    Complication of anesthesia    Dyspnea    GERD (gastroesophageal reflux disease)    History of kidney stones    Hypertension    Osteoporosis    Pneumonia    PONV (postoperative nausea and vomiting)    Stevens-Johnson syndrome (HCC)     Past Surgical History:  Procedure Laterality Date   ABDOMINAL HYSTERECTOMY     partial   BACK SURGERY     CYSTOSCOPY WITH STENT PLACEMENT Right 04/13/2020    Procedure: CYSTOSCOPY WITH STENT PLACEMENT;  Surgeon: Sninsky, Brian C, MD;  Location: [facility];  Service: Urology;  Laterality: Right;   CYSTOSCOPY/URETEROSCOPY/HOLMIUM LASER/STENT PLACEMENT Right 04/27/2020   Procedure: CYSTOSCOPY/URETEROSCOPY/HOLMIUM LASER/STENT EXCHANGE;  Surgeon: Sninsky, Brian C, MD;  Location: [facility];  Service: Urology;  Laterality: Right;   ESOPHAGOGASTRODUODENOSCOPY N/A 06/30/2019   Procedure: ESOPHAGOGASTRODUODENOSCOPY (EGD);  Surgeon: Toledo, Teodoro K, MD;  Location: [facility];  Service: Gastroenterology;  Laterality: N/A;   FOOT SURGERY     FRACTURE SURGERY     left hip repair   HAND SURGERY     THUMB REPAIR   HEMORRHOID SURGERY     INTRAMEDULLARY (IM) NAIL INTERTROCHANTERIC Left 08/06/2017   Procedure: INTRAMEDULLARY (IM) NAIL INTERTROCHANTRIC;  Surgeon: Patel, Sunny, MD;  Location: [facility];  Service: Orthopedics;  Laterality: Left;   POSTERIOR REPAIR      Home Medications:  Current Meds  Medication Sig   acetaminophen (TYLENOL) 325 MG tablet Take 2 tablets (650 mg total) by mouth every 6 (six) hours.   cholecalciferol (VITAMIN D3) 25 MCG (1000 UNIT) tablet Take 2,000 Units by mouth daily.   donepezil (ARICEPT) 10 MG tablet Take 10 mg by mouth daily.   escitalopram (LEXAPRO) 5 MG tablet Take 5 mg by mouth daily.   ferrous sulfate 324 MG TBEC Take 324 mg by mouth daily with breakfast.   lisinopril (ZESTRIL) 10 MG tablet Take 10 mg by mouth daily.   Melatonin 10 MG TABS Take 10 mg   by mouth at bedtime.   Multiple Vitamin (MULTIVITAMIN) tablet Take 1 tablet by mouth daily.   ondansetron (ZOFRAN) 4 MG tablet Take 4 mg by mouth every 8 (eight) hours as needed for nausea or vomiting. Takes 1-2 times daily with pain medication   Oxycodone HCl 10 MG TABS Take 1 tablet (10 mg total) by mouth 3 (three) times daily as needed.   sennosides-docusate sodium (SENOKOT-S) 8.6-50 MG tablet Take 1 tablet by mouth daily. Takes with pain medication   [DISCONTINUED] senna  (SENOKOT) 8.6 MG TABS tablet Take 2 tablets (17.2 mg total) by mouth daily.    Allergies:  Allergies  Allergen Reactions   Codeine Nausea And Vomiting   Sulfa Antibiotics Other (See Comments)    STEVEN JOHNSON SYNDROME    Family History  Problem Relation Age of Onset   Diabetes Paternal Grandmother    Uterine cancer Maternal Aunt    Stomach cancer Maternal Aunt    Alcohol abuse Father    Breast cancer Neg Hx    Ovarian cancer Neg Hx    Colon cancer Neg Hx    Heart disease Neg Hx     Social History:  reports that she quit smoking about 36 years ago. Her smoking use included cigarettes. She has never used smokeless tobacco. She reports that she does not drink alcohol and does not use drugs.  ROS: A complete review of systems was performed.  All systems are negative except for pertinent findings as noted.  Physical Exam:  Vital signs in last 24 hours: Temp:  [97.7 F (36.5 C)-98.8 F (37.1 C)] 97.7 F (36.5 C) (05/07 0830) Pulse Rate:  [50-92] 50 (05/07 0830) Resp:  [16-22] 16 (05/07 0830) BP: (106-143)/(77-99) 106/77 (05/07 0830) SpO2:  [91 %-98 %] 97 % (05/07 0830) Weight:  [59 kg] 59 kg (05/07 0500) Constitutional:  Alert, No acute distress HEENT: [place] AT, moist mucus membranes.  Trachea midline, no masses Cardiovascular: Regular rate and rhythm Respiratory: Normal respiratory effort, lungs clear bilaterally GI: Abdomen is soft, nontender, nondistended, no abdominal masses GU: No CVA tenderness Skin: No rashes, bruises or suspicious lesions Neurologic: Grossly intact, no focal deficits, moving all 4 extremities  Laboratory Data:  Recent Labs    06/22/22 0502 06/23/22 0549 06/24/22 0421  WBC 19.8* 22.2* 11.6*  HGB 9.3* 9.7* 8.9*  HCT 27.0* 29.4* 25.9*   Recent Labs    06/22/22 0502 06/23/22 0835 06/24/22 0421  NA 136 139 139  K 4.1 4.1 3.5  CL 104 106 107  CO2 20* 19* 21*  GLUCOSE 93 81 92  BUN 64* 69* 68*  CREATININE 2.68* 2.25* 1.93*  CALCIUM 8.0*  8.0* 8.2*   No results for input(s): "LABPT", "INR" in the last 72 hours. No results for input(s): "LABURIN" in the last 72 hours. Results for orders placed or performed during the hospital encounter of 06/21/22  Blood Culture (routine x 2)     Status: Abnormal   Collection Time: 06/21/22  4:00 PM   Specimen: BLOOD RIGHT FOREARM  Result Value Ref Range Status   Specimen Description   Final    BLOOD RIGHT FOREARM Performed at [facility], [address] [place], [place] [address]    Special Requests   Final    BOTTLES DRAWN AEROBIC AND ANAEROBIC Blood Culture adequate volume Performed at [facility], [address] [place], [place] [address]    Culture  Setup Time   Final    IN BOTH AEROBIC AND   ANAEROBIC BOTTLES GRAM NEGATIVE RODS CRITICAL RESULT CALLED TO, READ BACK BY AND VERIFIED WITH: NATHAN BELUE @ 0519 06/22/22 LFD Performed at [facility], [address] [place], [place] [address]    Culture ESCHERICHIA COLI (A)  Final   Report Status 06/24/2022 FINAL  Final   Organism ID, Bacteria ESCHERICHIA COLI  Final      Susceptibility   Escherichia coli - MIC*    AMPICILLIN 8 SENSITIVE Sensitive     CEFEPIME <=0.12 SENSITIVE Sensitive     CEFTAZIDIME <=1 SENSITIVE Sensitive     CEFTRIAXONE <=0.25 SENSITIVE Sensitive     CIPROFLOXACIN <=0.25 SENSITIVE Sensitive     GENTAMICIN <=1 SENSITIVE Sensitive     IMIPENEM <=0.25 SENSITIVE Sensitive     TRIMETH/SULFA <=20 SENSITIVE Sensitive     AMPICILLIN/SULBACTAM 4 SENSITIVE Sensitive     PIP/TAZO <=4 SENSITIVE Sensitive     * ESCHERICHIA COLI  Blood Culture ID Panel (Reflexed)     Status: Abnormal   Collection Time: 06/21/22  4:00 PM  Result Value Ref Range Status   Enterococcus faecalis NOT DETECTED NOT DETECTED Final   Enterococcus Faecium NOT DETECTED NOT DETECTED Final   Listeria monocytogenes NOT DETECTED NOT DETECTED Final   Staphylococcus species NOT DETECTED NOT DETECTED Final   Staphylococcus  aureus (BCID) NOT DETECTED NOT DETECTED Final   Staphylococcus epidermidis NOT DETECTED NOT DETECTED Final   Staphylococcus lugdunensis NOT DETECTED NOT DETECTED Final   Streptococcus species NOT DETECTED NOT DETECTED Final   Streptococcus agalactiae NOT DETECTED NOT DETECTED Final   Streptococcus pneumoniae NOT DETECTED NOT DETECTED Final   Streptococcus pyogenes NOT DETECTED NOT DETECTED Final   A.calcoaceticus-baumannii NOT DETECTED NOT DETECTED Final   Bacteroides fragilis NOT DETECTED NOT DETECTED Final   Enterobacterales DETECTED (A) NOT DETECTED Final    Comment: Enterobacterales represent a large order of gram negative bacteria, not a single organism. CRITICAL RESULT CALLED TO, READ BACK BY AND VERIFIED WITH: NATHAN BELUE @ 0519 06/22/22 LFD    Enterobacter cloacae complex NOT DETECTED NOT DETECTED Final   Escherichia coli DETECTED (A) NOT DETECTED Final    Comment: CRITICAL RESULT CALLED TO, READ BACK BY AND VERIFIED WITH: NATHAN BELUE @ 0519 06/22/22 LFD    Klebsiella aerogenes NOT DETECTED NOT DETECTED Final   Klebsiella oxytoca NOT DETECTED NOT DETECTED Final   Klebsiella pneumoniae NOT DETECTED NOT DETECTED Final   Proteus species NOT DETECTED NOT DETECTED Final   Salmonella species NOT DETECTED NOT DETECTED Final   Serratia marcescens NOT DETECTED NOT DETECTED Final   Haemophilus influenzae NOT DETECTED NOT DETECTED Final   Neisseria meningitidis NOT DETECTED NOT DETECTED Final   Pseudomonas aeruginosa NOT DETECTED NOT DETECTED Final   Stenotrophomonas maltophilia NOT DETECTED NOT DETECTED Final   Candida albicans NOT DETECTED NOT DETECTED Final   Candida auris NOT DETECTED NOT DETECTED Final   Candida glabrata NOT DETECTED NOT DETECTED Final   Candida krusei NOT DETECTED NOT DETECTED Final   Candida parapsilosis NOT DETECTED NOT DETECTED Final   Candida tropicalis NOT DETECTED NOT DETECTED Final   Cryptococcus neoformans/gattii NOT DETECTED NOT DETECTED Final    CTX-M ESBL NOT DETECTED NOT DETECTED Final   Carbapenem resistance IMP NOT DETECTED NOT DETECTED Final   Carbapenem resistance KPC NOT DETECTED NOT DETECTED Final   Carbapenem resistance NDM NOT DETECTED NOT DETECTED Final   Carbapenem resist OXA 48 LIKE NOT DETECTED NOT DETECTED Final   Carbapenem resistance VIM NOT DETECTED NOT DETECTED Final      Comment: Performed at [facility], [address] [place], [place] [address]  Blood Culture (routine x 2)     Status: Abnormal   Collection Time: 06/21/22  4:01 PM   Specimen: BLOOD  Result Value Ref Range Status   Specimen Description   Final    BLOOD RIGHT ANTECUBITAL Performed at [facility], [address] [place], [place] [address]    Special Requests   Final    BOTTLES DRAWN AEROBIC AND ANAEROBIC Blood Culture results may not be optimal due to an excessive volume of blood received in culture bottles Performed at [facility], [address] [place], [place] [address]    Culture  Setup Time   Final    GRAM NEGATIVE RODS IN BOTH AEROBIC AND ANAEROBIC BOTTLES CRITICAL RESULT CALLED TO, READ BACK BY AND VERIFIED WITH: NATHAN BELUE@ 0519 06/22/22 LFD Performed at  Hospital Lab, [address] [place], [place] [address]    Culture (A)  Final    ESCHERICHIA COLI SUSCEPTIBILITIES PERFORMED ON PREVIOUS CULTURE WITHIN THE LAST 5 DAYS. Performed at [facility], [address] [place], [place] [address]    Report Status 06/24/2022 FINAL  Final  Resp panel by RT-PCR (RSV, Flu A&B, Covid)     Status: None   Collection Time: 06/21/22  4:01 PM  Result Value Ref Range Status   SARS Coronavirus 2 by RT PCR NEGATIVE NEGATIVE Final    Comment: (NOTE) SARS-CoV-2 target nucleic acids are NOT DETECTED.  The SARS-CoV-2 RNA is generally detectable in upper respiratory specimens during the acute phase of infection. The lowest concentration of SARS-CoV-2 viral copies this assay can detect is 138  copies/mL. A negative result does not preclude SARS-Cov-2 infection and should not be used as the sole basis for treatment or other patient management decisions. A negative result may occur with  improper specimen collection/handling, submission of specimen other than nasopharyngeal swab, presence of viral mutation(s) within the areas targeted by this assay, and inadequate number of viral copies(<138 copies/mL). A negative result must be combined with clinical observations, patient history, and epidemiological information. The expected result is Negative.  Fact Sheet for Patients:  https://www.fda.gov/media/152166/download  Fact Sheet for Healthcare Providers:  https://www.fda.gov/media/152162/download  This test is no t yet approved or cleared by the [place] FDA and  has been authorized for detection and/or diagnosis of SARS-CoV-2 by FDA under an Emergency Use Authorization (EUA). This EUA will remain  in effect (meaning this test can be used) for the duration of the COVID-19 declaration under Section 564(b)(1) of the Act, 21 U.S.C.section 360bbb-3(b)(1), unless the authorization is terminated  or revoked sooner.       Influenza A by PCR NEGATIVE NEGATIVE Final   Influenza B by PCR NEGATIVE NEGATIVE Final    Comment: (NOTE) The Xpert Xpress SARS-CoV-2/FLU/RSV plus assay is intended as an aid in the diagnosis of influenza from Nasopharyngeal swab specimens and should not be used as a sole basis for treatment. Nasal washings and aspirates are unacceptable for Xpert Xpress SARS-CoV-2/FLU/RSV testing.  Fact Sheet for Patients: https://www.fda.gov/media/152166/download  Fact Sheet for Healthcare Providers: https://www.fda.gov/media/152162/download  This test is not yet approved or cleared by the [place] FDA and has been authorized for detection and/or diagnosis of SARS-CoV-2 by FDA under an Emergency Use Authorization (EUA). This EUA will remain in effect (meaning  this test can be used) for the duration of the COVID-19 declaration under Section 564(b)(1) of the Act, 21 U.S.C. section 360bbb-3(b)(1),   unless the authorization is terminated or revoked.     Resp Syncytial Virus by PCR NEGATIVE NEGATIVE Final    Comment: (NOTE) Fact Sheet for Patients: https://www.fda.gov/media/152166/download  Fact Sheet for Healthcare Providers: https://www.fda.gov/media/152162/download  This test is not yet approved or cleared by the [place] FDA and has been authorized for detection and/or diagnosis of SARS-CoV-2 by FDA under an Emergency Use Authorization (EUA). This EUA will remain in effect (meaning this test can be used) for the duration of the COVID-19 declaration under Section 564(b)(1) of the Act, 21 U.S.C. section 360bbb-3(b)(1), unless the authorization is terminated or revoked.  Performed at [facility], [address] [place], [place] [address]   Urine Culture     Status: Abnormal   Collection Time: 06/21/22  7:47 PM   Specimen: Urine, Random  Result Value Ref Range Status   Specimen Description   Final    URINE, RANDOM Performed at [facility], [address] [place], [place] [address]    Special Requests   Final    NONE Reflexed from [id number] Performed at [facility], [address] [place], [place] [address]    Culture MULTIPLE SPECIES PRESENT, SUGGEST RECOLLECTION (A)  Final   Report Status 06/23/2022 FINAL  Final  MRSA Next Gen by PCR, Nasal     Status: Abnormal   Collection Time: 06/21/22 11:02 PM   Specimen: Nasal Mucosa; Nasal Swab  Result Value Ref Range Status   MRSA by PCR Next Gen DETECTED (A) NOT DETECTED Final    Comment: CRITICAL RESULT CALLED TO, READ BACK BY AND VERIFIED WITH: SCRIVNER,IVAN RN @ 0210 06/22/22 LFD Performed at [facility], [address] [place], [place] [address]      Radiologic Imaging: CT images were personally reviewed and interpreted  US  Abdomen Limited RUQ (LIVER/GB)  Result Date: 06/24/2022 CLINICAL DATA:  Abdominal pain EXAM: ULTRASOUND ABDOMEN LIMITED RIGHT UPPER QUADRANT COMPARISON:  None Available. FINDINGS: Gallbladder: Dilated gallbladder. Borderline wall thickening. No shadowing stones. There is some sludge. There is some adjacent fluid as well to the gallbladder. Common bile duct: Diameter: 6 mm Liver: Nodular contours of the liver with mild ascites. Right-sided pleural effusion. Portal vein is patent on color Doppler imaging with normal direction of blood flow towards the liver. Other: None. IMPRESSION: Distended gallbladder with sludge.  No stones. There is some wall thickening and adjacent fluid although there is a nodular appearance of the liver with the ascites. Please correlate for chronic liver disease. Overall if there is further concern of acalculous cholecystitis with the dilatation and wall thickening, HIDA scan may be useful as the next step in the workup Electronically Signed   By: Ashok  Gupta M.D.   On: 06/24/2022 12:05   CT RENAL STONE STUDY  Result Date: 06/23/2022 CLINICAL DATA:  Abdominal/flank pain, stone suspected EXAM: CT ABDOMEN AND PELVIS WITHOUT CONTRAST TECHNIQUE: Multidetector CT imaging of the abdomen and pelvis was performed following the standard protocol without IV contrast. RADIATION DOSE REDUCTION: This exam was performed according to the departmental dose-optimization program which includes automated exposure control, adjustment of the mA and/or kV according to patient size and/or use of iterative reconstruction technique. COMPARISON:  Contrast enhanced CT 04/13/2021 FINDINGS: Lower chest: Small to moderate bilateral pleural effusions. Associated bibasilar airspace disease. Additional airspace disease in the lingula and right middle lobe is chronic when compared with prior abdominal CT. There are coronary artery calcifications. Mitral annulus calcifications. The heart is enlarged. Hepatobiliary: Motion  artifact   through the liver, allowing for this, no evidence of focal liver lesion. The gallbladder is distended. Assessment for pericholecystic inflammation is limited due to motion through this region. The tiny gallstones on prior exam are not definitively seen. The common bile duct and biliary tree are difficult to define. There is no obvious biliary dilatation. Pancreas: Parenchymal atrophy. Motion artifact through the proximal pancreas. There is no pancreatic inflammation. Spleen: Normal in size without focal abnormality. Adrenals/Urinary Tract: No adrenal nodule. There is a 2 cm stone in the left renal pelvis extending into the proximal ureter with moderate hydronephrosis. 14 mm no obstructing stone in the lower pole of the left kidney and 3 mm nonobstructing stone in the upper pole of the left kidney. No significant left perinephric edema. Right renal cortical atrophy. Calcification in the posterior mid upper kidney likely represents a combination of parenchymal calcification a nonobstructing stone, linear 11 mm calcification may represent a stone fragment in the renal collecting system. Previous proximal right ureteral stone is no longer seen. There is mild right perirenal scarring. No definite ureteral calculi. Nondistended urinary bladder. No bladder stone. Stomach/Bowel: No bowel obstruction or inflammation. The appendix not confidently visualized, no appendicitis. Small to moderate volume of stool throughout the colon. Occasional colonic diverticula, no diverticulitis. Vascular/Lymphatic: Moderate aortic atherosclerosis. Aortic tortuosity but no aneurysm. Assessment for adenopathy is limited in the absence of contrast and patient motion. No obvious bulky enlarged lymph nodes. Reproductive: Hysterectomy. Small amount of air in the vagina is nonspecific. No adnexal mass. Other: Small volume of free fluid in the pelvis. Trace free fluid in the right upper quadrant. There is mild body wall subcutaneous edema.  No free intra-abdominal air. Musculoskeletal: Interbody spacer at L4-L5. Mild compression deformities versus Schmorl's nodes within superior endplate of T11, L1 and L3, chronic. The bones are subjectively under mineralized. Left proximal femur surgical hardware is partially included. No acute osseous findings. IMPRESSION: 1. Obstructing 2 cm stone in the left renal pelvis extending into the proximal ureter with moderate hydronephrosis. Additional nonobstructing stones in the left kidney. 2. Mild right renal scarring. Calcifications in the right kidney may be combination of parenchymal calcifications and stone fragments. 3. Hydropic gallbladder. Assessment for pericholecystic inflammation is limited due to motion through this region. The tiny gallstones on prior exam are not definitively seen. If there is clinical concern for acute gallbladder pathology, recommend right upper quadrant ultrasound. 4. Small to moderate bilateral pleural effusions with adjacent airspace disease, likely atelectasis. Additional airspace disease in the lingula and right middle lobe is chronic when compared with prior abdominal CT. 5. Small volume of free fluid in the pelvis and right upper quadrant, nonspecific. Mild body wall edema. Aortic Atherosclerosis (ICD10-I70.0). Electronically Signed   By: Melanie  Sanford M.D.   On: 06/23/2022 18:18    Impression/Assessment:  77 y.o. female admitted 06/21/2022 with sepsis from a urinary source.  She responded to antibiotic therapy and continues to improve CT not performed on admission but performed yesterday showing an obstructing left renal pelvic calculus  Plan:  Improving clinically and she is not n.p.o. Recommend cystoscopy with placement left ureteral stent 06/25/2022 We discussed due to infection no attempt will be made to treat her stone and she will need a follow-up procedure for definitive stone treatment All questions were answered and she desires to proceed She asked that I call  her sister and the procedure was also discussed with her sister, Brenda Koke   06/24/2022, 2:31 PM  Ginny Loomer,  MD     

## 2022-06-24 NOTE — Progress Notes (Signed)
Initial Nutrition Assessment  DOCUMENTATION CODES:   Severe malnutrition in context of chronic illness  INTERVENTION:  - Modify to Dys 3 diet.   - Add Ensure Enlive po BID, each supplement provides 350 kcal and 20 grams of protein.  NUTRITION DIAGNOSIS:   Severe Malnutrition related to chronic illness as evidenced by severe muscle depletion, severe fat depletion.  GOAL:   Patient will meet greater than or equal to 90% of their needs  MONITOR:   PO intake, Supplement acceptance  REASON FOR ASSESSMENT:   Consult Assessment of nutrition requirement/status  ASSESSMENT:   77 y.o. female admits related to AMS. PMH includes: dementia, HTN, GERD, arthritis, Stevens-Johnson's syndrome. Pt is currently receiving medical management related to severe sepsis with lactic acidosis.  Meds reviewed: Vit D3, ferrous sulfate, MVI, senokot. Labs reviewed: BUN/Creatinine elevated, GFR low.   The pt is oriented x1. Pt is currently on a Regular diet. Pt unable to provide any nutrition hx. It is noted that the pt does not have teeth. RD will modify diet to soft and bite-sized. Will also add Ensure BID. Will continue to monitor PO intakes.   NUTRITION - FOCUSED PHYSICAL EXAM:  Flowsheet Row Most Recent Value  Orbital Region Severe depletion  Upper Arm Region Moderate depletion  Thoracic and Lumbar Region Unable to assess  Buccal Region Severe depletion  Temple Region Severe depletion  Clavicle Bone Region Moderate depletion  Clavicle and Acromion Bone Region Severe depletion  Scapular Bone Region Unable to assess  Dorsal Hand Moderate depletion  Patellar Region Moderate depletion  Anterior Thigh Region Moderate depletion  Posterior Calf Region Moderate depletion  Edema (RD Assessment) None  Hair Reviewed  Eyes Reviewed  Mouth Unable to assess  Skin Reviewed  Nails Reviewed       Diet Order:   Diet Order             DIET DYS 3 Room service appropriate? No; Fluid consistency:  Thin  Diet effective now                   EDUCATION NEEDS:   Not appropriate for education at this time  Skin:  Skin Assessment: Reviewed RN Assessment  Last BM:  PTA  Height:   Ht Readings from Last 1 Encounters:  06/21/22 5\' 10"  (1.778 m)    Weight:   Wt Readings from Last 1 Encounters:  06/24/22 59 kg    Ideal Body Weight:     BMI:  Body mass index is 18.66 kg/m.  Estimated Nutritional Needs:   Kcal:  1610-9604 kcals  Protein:  85-100 gm  Fluid:  >/= 1.7 L  Bethann Humble, RD, LDN, CNSC.

## 2022-06-24 NOTE — Progress Notes (Signed)
PROGRESS NOTE    Alison Price  OZH:[id number] DOB: 07-Aug-1945 DOA: 06/21/2022 PCP: Jaclyn Shaggy, MD   Brief Narrative:  This 77 yrs old  female with PMH significant of dementia, hypertension, GERD, arthritis, Stevens-Johnson's syndrome, recurrent falls with recent right patella and left wrist fracture who presented to the ED with chief complaints of altered mental status.  She was found to have severe sepsis secondary to UTI.  She was started on IV antibiotics.  She is improving.   Assessment & Plan:   Principal Problem:   Severe sepsis with lactic acidosis (HCC)  Severe sepsis /shock due to E. coli UTI: E. coli bacteremia / pneumonia: H/o Ureteral stone in 2022 (had cystoscopy with stent placement and h/o Lithotripsy): Patient presented with septic shock requiring IV fluids / vasopressors. Sepsis physiology improving. Blood culture positive for E. Coli. Continue IV Rocephin , change to oral at discharge. She remains afebrile.  Lactic acid trending down. Procalcitonin still elevated more than 150. Patient currently on room air. No cough CT renal study shows obstructing stone causing hydronephrosis. Urology is consulted.  Awaiting recommendation.   Leukocytosis: Presented with WBC 4.8 trended up to 19.8--22K. Continue IV ceftriaxone.   Paroxysmal  New onset A. fib in the setting of sepsis >  Now resolved She was started on IV amiodarone drip. Heart rate  now ranges between 57-70 EKG showed normal sinus rhythm.   Acute on chronic kidney disease stage IV: Baseline serum creatinine( 0.9--1.22 in sept 2022).  Could not find any other labs prior to this. Continue IV fluid resuscitation. Avoid nephrotoxic medications.  creatinine is improving 3.07 > 2.70 > 2.68 > 2.25 > 1.93>   Hypomagnesimia Replaced.  Continue to monitor   GERD: Pantoprazole 40 mg daily   Generalized weakness with deconditioning: PT and OT evaluation   Dementia without behavioral problems: Sister  [name] who is next of kin.  She reports patient has dementia. Patient lives with [name] at home. She is fairly independent. TOC for discharge planning    DVT prophylaxis: Heparin Code Status: DNR Family Communication: No family at bedside Disposition Plan:  Status is: Inpatient Remains inpatient appropriate because: Admitted for severe sepsis secondary to E. coli bacteremia, UTI.  CT renal study shows kidney stone causing hydronephrosis.  Urology is consulted.  Continued on IV antibiotics  Consultants:  Urology  Procedures: CT renal study.  Antimicrobials:  Anti-infectives (From admission, onward)    Start     Dose/Rate Route Frequency Ordered Stop   06/22/22 1000  azithromycin (ZITHROMAX) 500 mg in sodium chloride 0.9 % 250 mL IVPB  Status:  Discontinued        500 mg 250 mL/hr over 60 Minutes Intravenous Every 24 hours 06/21/22 2251 06/22/22 0913   06/22/22 0800  cefTRIAXone (ROCEPHIN) 2 g in sodium chloride 0.9 % 100 mL IVPB        2 g 200 mL/hr over 30 Minutes Intravenous Every 24 hours 06/21/22 2251     06/21/22 1700  ceFEPIme (MAXIPIME) 2 g in sodium chloride 0.9 % 100 mL IVPB        2 g 200 mL/hr over 30 Minutes Intravenous  Once 06/21/22 1652 06/21/22 1713   06/21/22 1700  vancomycin (VANCOCIN) IVPB 1000 mg/200 mL premix        1,000 mg 200 mL/hr over 60 Minutes Intravenous  Once 06/21/22 1652 06/21/22 1814       Subjective: Patient was seen and examined at bedside.  Overnight events noted.  Patient has baseline dementia.  She follows commands.  She is oriented to self only.  Objective: Vitals:   06/23/22 2056 06/24/22 0446 06/24/22 0500 06/24/22 0830  BP: (!) 143/98 (!) 141/99  106/77  Pulse: 92 88  (!) 50  Resp: (!) 22 (!) 22  16  Temp: 98.8 F (37.1 C) 98 F (36.7 C)  97.7 F (36.5 C)  TempSrc: Oral Oral    SpO2: 91% 98%  97%  Weight:   59 kg   Height:        Intake/Output Summary (Last 24 hours) at 06/24/2022 1130 Last data filed at 06/24/2022  0829 Gross per 24 hour  Intake 2688.46 ml  Output --  Net 2688.46 ml   Filed Weights   06/21/22 1610 06/22/22 0700 06/24/22 0500  Weight: 58.4 kg 58.4 kg 59 kg    Examination:  General exam: Appears calm and comfortable, deconditioned, not in any acute distress. Respiratory system: Clear to auscultation. Respiratory effort normal.  RR 16. Cardiovascular system: S1 & S2 heard, RRR. No JVD, murmurs, rubs, gallops or clicks.  Gastrointestinal system: Abdomen is soft, non tender, non distended, BS+ Central nervous system: Alert and oriented x 1. No focal neurological deficits. Extremities: No edema, no cyanosis, no clubbing. Skin: No rashes, lesions or ulcers Psychiatry: Judgement and insight appear normal. Mood & affect appropriate.     Data Reviewed: I have personally reviewed following labs and imaging studies  CBC: Recent Labs  Lab 06/21/22 1601 06/22/22 0502 06/23/22 0549 06/24/22 0421  WBC 4.9 19.8* 22.2* 11.6*  NEUTROABS 3.5  --   --   --   HGB 12.1 9.3* 9.7* 8.9*  HCT 35.7* 27.0* 29.4* 25.9*  MCV 93.2 91.8 93.3 92.2  PLT 43* PLATELET CLUMPS NOTED ON SMEAR, UNABLE TO ESTIMATE 33* 40*   Basic Metabolic Panel: Recent Labs  Lab 06/21/22 1601 06/21/22 2301 06/22/22 0502 06/23/22 0835 06/24/22 0421  NA 141 137 136 139 139  K 2.9* 3.6 4.1 4.1 3.5  CL 104 104 104 106 107  CO2 22 20* 20* 19* 21*  GLUCOSE 83 97 93 81 92  BUN 64* 64* 64* 69* 68*  CREATININE 3.07* 2.70* 2.68* 2.25* 1.93*  CALCIUM 8.3* 8.1* 8.0* 8.0* 8.2*  MG  --   --  1.2*  --  2.1  PHOS  --   --  3.9  --   --    GFR: Estimated Creatinine Clearance: 23.1 mL/min (A) (by C-G formula based on SCr of 1.93 mg/dL (H)). Liver Function Tests: Recent Labs  Lab 06/21/22 1601  AST 46*  ALT 12  ALKPHOS 134*  BILITOT 1.3*  PROT 6.8  ALBUMIN 3.6   No results for input(s): "LIPASE", "AMYLASE" in the last 168 hours. No results for input(s): "AMMONIA" in the last 168 hours. Coagulation Profile: No  results for input(s): "INR", "PROTIME" in the last 168 hours. Cardiac Enzymes: No results for input(s): "CKTOTAL", "CKMB", "CKMBINDEX", "TROPONINI" in the last 168 hours. BNP (last 3 results) No results for input(s): "PROBNP" in the last 8760 hours. HbA1C: No results for input(s): "HGBA1C" in the last 72 hours. CBG: Recent Labs  Lab 06/21/22 2243  GLUCAP 92   Lipid Profile: No results for input(s): "CHOL", "HDL", "LDLCALC", "TRIG", "CHOLHDL", "LDLDIRECT" in the last 72 hours. Thyroid Function Tests: Recent Labs    06/21/22 2301  TSH 1.571  FREET4 0.96   Anemia Panel: No results for input(s): "VITAMINB12", "FOLATE", "FERRITIN", "TIBC", "IRON", "RETICCTPCT" in the last 72 hours.  Sepsis Labs: Recent Labs  Lab 06/21/22 1601 06/21/22 1947 06/21/22 2358  PROCALCITON >150.00  --   --   LATICACIDVEN 4.4* 5.5* 2.7*    Recent Results (from the past 240 hour(s))  Blood Culture (routine x 2)     Status: Abnormal   Collection Time: 06/21/22  4:00 PM   Specimen: BLOOD RIGHT FOREARM  Result Value Ref Range Status   Specimen Description   Final    BLOOD RIGHT FOREARM Performed at [facility], [address], [place], [place] [address]    Special Requests   Final    BOTTLES DRAWN AEROBIC AND ANAEROBIC Blood Culture adequate volume Performed at [facility], [address] [place], [place] [address]    Culture  Setup Time   Final    IN BOTH AEROBIC AND ANAEROBIC BOTTLES GRAM NEGATIVE RODS CRITICAL RESULT CALLED TO, READ BACK BY AND VERIFIED WITHDawayne Cirri @ [id number] 06/22/22 LFD Performed at [facility], [address] [place], [place] [address]    Culture ESCHERICHIA COLI (A)  Final   Report Status 06/24/2022 FINAL  Final   Organism ID, Bacteria ESCHERICHIA COLI  Final      Susceptibility   Escherichia coli - MIC*    AMPICILLIN 8 SENSITIVE Sensitive     CEFEPIME <=0.12 SENSITIVE Sensitive     CEFTAZIDIME <=1 SENSITIVE Sensitive     CEFTRIAXONE  <=0.25 SENSITIVE Sensitive     CIPROFLOXACIN <=0.25 SENSITIVE Sensitive     GENTAMICIN <=1 SENSITIVE Sensitive     IMIPENEM <=0.25 SENSITIVE Sensitive     TRIMETH/SULFA <=20 SENSITIVE Sensitive     AMPICILLIN/SULBACTAM 4 SENSITIVE Sensitive     PIP/TAZO <=4 SENSITIVE Sensitive     * ESCHERICHIA COLI  Blood Culture ID Panel (Reflexed)     Status: Abnormal   Collection Time: 06/21/22  4:00 PM  Result Value Ref Range Status   Enterococcus faecalis NOT DETECTED NOT DETECTED Final   Enterococcus Faecium NOT DETECTED NOT DETECTED Final   Listeria monocytogenes NOT DETECTED NOT DETECTED Final   Staphylococcus species NOT DETECTED NOT DETECTED Final   Staphylococcus aureus (BCID) NOT DETECTED NOT DETECTED Final   Staphylococcus epidermidis NOT DETECTED NOT DETECTED Final   Staphylococcus lugdunensis NOT DETECTED NOT DETECTED Final   Streptococcus species NOT DETECTED NOT DETECTED Final   Streptococcus agalactiae NOT DETECTED NOT DETECTED Final   Streptococcus pneumoniae NOT DETECTED NOT DETECTED Final   Streptococcus pyogenes NOT DETECTED NOT DETECTED Final   A.calcoaceticus-baumannii NOT DETECTED NOT DETECTED Final   Bacteroides fragilis NOT DETECTED NOT DETECTED Final   Enterobacterales DETECTED (A) NOT DETECTED Final    Comment: Enterobacterales represent a large order of gram negative bacteria, not a single organism. CRITICAL RESULT CALLED TO, READ BACK BY AND VERIFIED WITH: NATHAN BELUE @ 0519 06/22/22 LFD    Enterobacter cloacae complex NOT DETECTED NOT DETECTED Final   Escherichia coli DETECTED (A) NOT DETECTED Final    Comment: CRITICAL RESULT CALLED TO, READ BACK BY AND VERIFIED WITH: NATHAN BELUE @ 0519 06/22/22 LFD    Klebsiella aerogenes NOT DETECTED NOT DETECTED Final   Klebsiella oxytoca NOT DETECTED NOT DETECTED Final   Klebsiella pneumoniae NOT DETECTED NOT DETECTED Final   Proteus species NOT DETECTED NOT DETECTED Final   Salmonella species NOT DETECTED NOT DETECTED  Final   Serratia marcescens NOT DETECTED NOT DETECTED Final   Haemophilus influenzae NOT DETECTED NOT DETECTED Final   Neisseria meningitidis NOT DETECTED NOT DETECTED Final  Pseudomonas aeruginosa NOT DETECTED NOT DETECTED Final   Stenotrophomonas maltophilia NOT DETECTED NOT DETECTED Final   Candida albicans NOT DETECTED NOT DETECTED Final   Candida auris NOT DETECTED NOT DETECTED Final   Candida glabrata NOT DETECTED NOT DETECTED Final   Candida krusei NOT DETECTED NOT DETECTED Final   Candida parapsilosis NOT DETECTED NOT DETECTED Final   Candida tropicalis NOT DETECTED NOT DETECTED Final   Cryptococcus neoformans/gattii NOT DETECTED NOT DETECTED Final   CTX-M ESBL NOT DETECTED NOT DETECTED Final   Carbapenem resistance IMP NOT DETECTED NOT DETECTED Final   Carbapenem resistance KPC NOT DETECTED NOT DETECTED Final   Carbapenem resistance NDM NOT DETECTED NOT DETECTED Final   Carbapenem resist OXA 48 LIKE NOT DETECTED NOT DETECTED Final   Carbapenem resistance VIM NOT DETECTED NOT DETECTED Final    Comment: Performed at [facility], [address], [place], [place] [address]  Blood Culture (routine x 2)     Status: Abnormal   Collection Time: 06/21/22  4:01 PM   Specimen: BLOOD  Result Value Ref Range Status   Specimen Description   Final    BLOOD RIGHT ANTECUBITAL Performed at [facility], [address] [place], [place] [address]    Special Requests   Final    BOTTLES DRAWN AEROBIC AND ANAEROBIC Blood Culture results may not be optimal due to an excessive volume of blood received in culture bottles Performed at [facility], [address] [place], [place] [address]    Culture  Setup Time   Final    GRAM NEGATIVE RODS IN BOTH AEROBIC AND ANAEROBIC BOTTLES CRITICAL RESULT CALLED TO, READ BACK BY AND VERIFIED WITH: Dawayne Cirri [phone] 06/22/22 LFD Performed at [facility], [address] [place], [place] [address]    Culture (A)   Final    ESCHERICHIA COLI SUSCEPTIBILITIES PERFORMED ON PREVIOUS CULTURE WITHIN THE LAST 5 DAYS. Performed at [facility], [address] [place], [place] [address]    Report Status 06/24/2022 FINAL  Final  Resp panel by RT-PCR (RSV, Flu A&B, Covid)     Status: None   Collection Time: 06/21/22  4:01 PM  Result Value Ref Range Status   SARS Coronavirus 2 by RT PCR NEGATIVE NEGATIVE Final    Comment: (NOTE) SARS-CoV-2 target nucleic acids are NOT DETECTED.  The SARS-CoV-2 RNA is generally detectable in upper respiratory specimens during the acute phase of infection. The lowest concentration of SARS-CoV-2 viral copies this assay can detect is 138 copies/mL. A negative result does not preclude SARS-Cov-2 infection and should not be used as the sole basis for treatment or other patient management decisions. A negative result may occur with  improper specimen collection/handling, submission of specimen other than nasopharyngeal swab, presence of viral mutation(s) within the areas targeted by this assay, and inadequate number of viral copies(<138 copies/mL). A negative result must be combined with clinical observations, patient history, and epidemiological information. The expected result is Negative.  Fact Sheet for Patients:  BloggerCourse.com  Fact Sheet for Healthcare Providers:  SeriousBroker.it  This test is no t yet approved or cleared by the [place] FDA and  has been authorized for detection and/or diagnosis of SARS-CoV-2 by FDA under an Emergency Use Authorization (EUA). This EUA will remain  in effect (meaning this test can be used) for the duration of the COVID-19 declaration under Section 564(b)(1) of the Act, 21 U.S.C.section 360bbb-3(b)(1), unless the authorization is terminated  or revoked sooner.  Influenza A by PCR NEGATIVE NEGATIVE Final   Influenza B by PCR NEGATIVE NEGATIVE Final    Comment:  (NOTE) The Xpert Xpress SARS-CoV-2/FLU/RSV plus assay is intended as an aid in the diagnosis of influenza from Nasopharyngeal swab specimens and should not be used as a sole basis for treatment. Nasal washings and aspirates are unacceptable for Xpert Xpress SARS-CoV-2/FLU/RSV testing.  Fact Sheet for Patients: BloggerCourse.com  Fact Sheet for Healthcare Providers: SeriousBroker.it  This test is not yet approved or cleared by the [place] FDA and has been authorized for detection and/or diagnosis of SARS-CoV-2 by FDA under an Emergency Use Authorization (EUA). This EUA will remain in effect (meaning this test can be used) for the duration of the COVID-19 declaration under Section 564(b)(1) of the Act, 21 U.S.C. section 360bbb-3(b)(1), unless the authorization is terminated or revoked.     Resp Syncytial Virus by PCR NEGATIVE NEGATIVE Final    Comment: (NOTE) Fact Sheet for Patients: BloggerCourse.com  Fact Sheet for Healthcare Providers: SeriousBroker.it  This test is not yet approved or cleared by the [place] FDA and has been authorized for detection and/or diagnosis of SARS-CoV-2 by FDA under an Emergency Use Authorization (EUA). This EUA will remain in effect (meaning this test can be used) for the duration of the COVID-19 declaration under Section 564(b)(1) of the Act, 21 U.S.C. section 360bbb-3(b)(1), unless the authorization is terminated or revoked.  Performed at [facility], [address] [place], [place] [address]   Urine Culture     Status: Abnormal   Collection Time: 06/21/22  7:47 PM   Specimen: Urine, Random  Result Value Ref Range Status   Specimen Description   Final    URINE, RANDOM Performed at [facility], [address] [place], [place] [address]    Special Requests   Final    NONE Reflexed from [phone] Performed at  [facility], [address] [place], [place] [address]    Culture MULTIPLE SPECIES PRESENT, SUGGEST RECOLLECTION (A)  Final   Report Status 06/23/2022 FINAL  Final  MRSA Next Gen by PCR, Nasal     Status: Abnormal   Collection Time: 06/21/22 11:02 PM   Specimen: Nasal Mucosa; Nasal Swab  Result Value Ref Range Status   MRSA by PCR Next Gen DETECTED (A) NOT DETECTED Final    Comment: CRITICAL RESULT CALLED TO, READ BACK BY AND VERIFIED WITH: SCRIVNER,IVAN RN @ 0210 06/22/22 LFD Performed at [facility], [address], [place], [place] [address]     Radiology Studies: CT RENAL STONE STUDY  Result Date: 06/23/2022 CLINICAL DATA:  Abdominal/flank pain, stone suspected EXAM: CT ABDOMEN AND PELVIS WITHOUT CONTRAST TECHNIQUE: Multidetector CT imaging of the abdomen and pelvis was performed following the standard protocol without IV contrast. RADIATION DOSE REDUCTION: This exam was performed according to the departmental dose-optimization program which includes automated exposure control, adjustment of the mA and/or kV according to patient size and/or use of iterative reconstruction technique. COMPARISON:  Contrast enhanced CT 04/13/2021 FINDINGS: Lower chest: Small to moderate bilateral pleural effusions. Associated bibasilar airspace disease. Additional airspace disease in the lingula and right middle lobe is chronic when compared with prior abdominal CT. There are coronary artery calcifications. Mitral annulus calcifications. The heart is enlarged. Hepatobiliary: Motion artifact through the liver, allowing for this, no evidence of focal liver lesion. The gallbladder is distended. Assessment for pericholecystic inflammation is limited due to motion through this region. The tiny gallstones on prior exam are  not definitively seen. The common bile duct and biliary tree are difficult to define. There is no obvious biliary dilatation. Pancreas: Parenchymal atrophy. Motion artifact  through the proximal pancreas. There is no pancreatic inflammation. Spleen: Normal in size without focal abnormality. Adrenals/Urinary Tract: No adrenal nodule. There is a 2 cm stone in the left renal pelvis extending into the proximal ureter with moderate hydronephrosis. 14 mm no obstructing stone in the lower pole of the left kidney and 3 mm nonobstructing stone in the upper pole of the left kidney. No significant left perinephric edema. Right renal cortical atrophy. Calcification in the posterior mid upper kidney likely represents a combination of parenchymal calcification a nonobstructing stone, linear 11 mm calcification may represent a stone fragment in the renal collecting system. Previous proximal right ureteral stone is no longer seen. There is mild right perirenal scarring. No definite ureteral calculi. Nondistended urinary bladder. No bladder stone. Stomach/Bowel: No bowel obstruction or inflammation. The appendix not confidently visualized, no appendicitis. Small to moderate volume of stool throughout the colon. Occasional colonic diverticula, no diverticulitis. Vascular/Lymphatic: Moderate aortic atherosclerosis. Aortic tortuosity but no aneurysm. Assessment for adenopathy is limited in the absence of contrast and patient motion. No obvious bulky enlarged lymph nodes. Reproductive: Hysterectomy. Small amount of air in the vagina is nonspecific. No adnexal mass. Other: Small volume of free fluid in the pelvis. Trace free fluid in the right upper quadrant. There is mild body wall subcutaneous edema. No free intra-abdominal air. Musculoskeletal: Interbody spacer at L4-L5. Mild compression deformities versus Schmorl's nodes within superior endplate of T11, L1 and L3, chronic. The bones are subjectively under mineralized. Left proximal femur surgical hardware is partially included. No acute osseous findings. IMPRESSION: 1. Obstructing 2 cm stone in the left renal pelvis extending into the proximal ureter  with moderate hydronephrosis. Additional nonobstructing stones in the left kidney. 2. Mild right renal scarring. Calcifications in the right kidney may be combination of parenchymal calcifications and stone fragments. 3. Hydropic gallbladder. Assessment for pericholecystic inflammation is limited due to motion through this region. The tiny gallstones on prior exam are not definitively seen. If there is clinical concern for acute gallbladder pathology, recommend right upper quadrant ultrasound. 4. Small to moderate bilateral pleural effusions with adjacent airspace disease, likely atelectasis. Additional airspace disease in the lingula and right middle lobe is chronic when compared with prior abdominal CT. 5. Small volume of free fluid in the pelvis and right upper quadrant, nonspecific. Mild body wall edema. Aortic Atherosclerosis (ICD10-I70.0). Electronically Signed   By: Narda Rutherford M.D.   On: 06/23/2022 18:18    Scheduled Meds:  Chlorhexidine Gluconate Cloth  6 each Topical Q0600   cholecalciferol  2,000 Units Oral Daily   donepezil  10 mg Oral QHS   escitalopram  5 mg Oral Daily   ferrous sulfate  324 mg Oral Q breakfast   heparin  5,000 Units Subcutaneous Q8H   melatonin  10 mg Oral QHS   multivitamin with minerals  1 tablet Oral Daily   mupirocin ointment  1 Application Nasal BID   senna-docusate  1 tablet Oral Daily   Continuous Infusions:  sodium chloride 10 mL/hr at 06/22/22 0900   cefTRIAXone (ROCEPHIN)  IV 2 g (06/24/22 0840)   lactated ringers 75 mL/hr at 06/24/22 0830     LOS: 3 days    Time spent: 50 mins    Willeen Niece, MD [facility]   If 7PM-7AM, please contact night-coverage

## 2022-06-24 NOTE — H&P (View-Only) (Signed)
Urology Consult  Requesting physician: Willeen Niece, MD  Reason for consultation: Obstructing renal calculus   History of Present Illness: Alison Price is a 77 y.o. female with PMH of dementia, hypertension, GERD followed by Dr. Richardo Hanks for nephrolithiasis.  She underwent right ureteral stent placement for an obstructing right proximal ureteral stone with sepsis and February 2022 and subsequently underwent ureteroscopic stone removal 04/2020.  She had nonobstructing left renal calculi and last saw Dr. Richardo Hanks October 2022.  She elected to follow these calculi and a 6-month follow-up was recommended however she did not follow-up.  She was seen in the ED 06/21/2022 with altered mental status.  She had leukocytosis and was hypotensive and admitted to the ICU with severe sepsis with a suspected UTI.  CT was not performed on admission.  Urine culture grew multiple species however blood culture positive for E. coli.  She improved clinically and was transferred out of the ICU on 06/22/2022.  A CT scan was ordered yesterday which showed an obstructing 2 cm left renal pelvic stone with moderate hydronephrosis.  There was a nonobstructing 14 mm lower pole calculus.  She has been afebrile and improving clinically and is presently on a regular diet.  History of dementia though apparently does not have a HCPOA or legal guardian.  She lives with her sister  Has had some mild left upper quadrant abdominal pain   Past Medical History:  Diagnosis Date   Anemia    Arthritis    Chronic pain    Complication of anesthesia    Dyspnea    GERD (gastroesophageal reflux disease)    History of kidney stones    Hypertension    Osteoporosis    Pneumonia    PONV (postoperative nausea and vomiting)    Stevens-Johnson syndrome (HCC)     Past Surgical History:  Procedure Laterality Date   ABDOMINAL HYSTERECTOMY     partial   BACK SURGERY     CYSTOSCOPY WITH STENT PLACEMENT Right 04/13/2020    Procedure: CYSTOSCOPY WITH STENT PLACEMENT;  Surgeon: Sondra Come, MD;  Location: [facility];  Service: Urology;  Laterality: Right;   CYSTOSCOPY/URETEROSCOPY/HOLMIUM LASER/STENT PLACEMENT Right 04/27/2020   Procedure: CYSTOSCOPY/URETEROSCOPY/HOLMIUM LASER/STENT EXCHANGE;  Surgeon: Sondra Come, MD;  Location: [facility];  Service: Urology;  Laterality: Right;   ESOPHAGOGASTRODUODENOSCOPY N/A 06/30/2019   Procedure: ESOPHAGOGASTRODUODENOSCOPY (EGD);  Surgeon: Toledo, Boykin Nearing, MD;  Location: [facility];  Service: Gastroenterology;  Laterality: N/A;   FOOT SURGERY     FRACTURE SURGERY     left hip repair   HAND SURGERY     THUMB REPAIR   HEMORRHOID SURGERY     INTRAMEDULLARY (IM) NAIL INTERTROCHANTERIC Left 08/06/2017   Procedure: INTRAMEDULLARY (IM) NAIL INTERTROCHANTRIC;  Surgeon: Signa Kell, MD;  Location: [facility];  Service: Orthopedics;  Laterality: Left;   POSTERIOR REPAIR      Home Medications:  Current Meds  Medication Sig   acetaminophen (TYLENOL) 325 MG tablet Take 2 tablets (650 mg total) by mouth every 6 (six) hours.   cholecalciferol (VITAMIN D3) 25 MCG (1000 UNIT) tablet Take 2,000 Units by mouth daily.   donepezil (ARICEPT) 10 MG tablet Take 10 mg by mouth daily.   escitalopram (LEXAPRO) 5 MG tablet Take 5 mg by mouth daily.   ferrous sulfate 324 MG TBEC Take 324 mg by mouth daily with breakfast.   lisinopril (ZESTRIL) 10 MG tablet Take 10 mg by mouth daily.   Melatonin 10 MG TABS Take 10 mg  by mouth at bedtime.   Multiple Vitamin (MULTIVITAMIN) tablet Take 1 tablet by mouth daily.   ondansetron (ZOFRAN) 4 MG tablet Take 4 mg by mouth every 8 (eight) hours as needed for nausea or vomiting. Takes 1-2 times daily with pain medication   Oxycodone HCl 10 MG TABS Take 1 tablet (10 mg total) by mouth 3 (three) times daily as needed.   sennosides-docusate sodium (SENOKOT-S) 8.6-50 MG tablet Take 1 tablet by mouth daily. Takes with pain medication   [DISCONTINUED] senna  (SENOKOT) 8.6 MG TABS tablet Take 2 tablets (17.2 mg total) by mouth daily.    Allergies:  Allergies  Allergen Reactions   Codeine Nausea And Vomiting   Sulfa Antibiotics Other (See Comments)    STEVEN JOHNSON SYNDROME    Family History  Problem Relation Age of Onset   Diabetes Paternal Grandmother    Uterine cancer Maternal Aunt    Stomach cancer Maternal Aunt    Alcohol abuse Father    Breast cancer Neg Hx    Ovarian cancer Neg Hx    Colon cancer Neg Hx    Heart disease Neg Hx     Social History:  reports that she quit smoking about 36 years ago. Her smoking use included cigarettes. She has never used smokeless tobacco. She reports that she does not drink alcohol and does not use drugs.  ROS: A complete review of systems was performed.  All systems are negative except for pertinent findings as noted.  Physical Exam:  Vital signs in last 24 hours: Temp:  [97.7 F (36.5 C)-98.8 F (37.1 C)] 97.7 F (36.5 C) (05/07 0830) Pulse Rate:  [50-92] 50 (05/07 0830) Resp:  [16-22] 16 (05/07 0830) BP: (106-143)/(77-99) 106/77 (05/07 0830) SpO2:  [91 %-98 %] 97 % (05/07 0830) Weight:  [59 kg] 59 kg (05/07 0500) Constitutional:  Alert, No acute distress HEENT: [place] AT, moist mucus membranes.  Trachea midline, no masses Cardiovascular: Regular rate and rhythm Respiratory: Normal respiratory effort, lungs clear bilaterally GI: Abdomen is soft, nontender, nondistended, no abdominal masses GU: No CVA tenderness Skin: No rashes, bruises or suspicious lesions Neurologic: Grossly intact, no focal deficits, moving all 4 extremities  Laboratory Data:  Recent Labs    06/22/22 0502 06/23/22 0549 06/24/22 0421  WBC 19.8* 22.2* 11.6*  HGB 9.3* 9.7* 8.9*  HCT 27.0* 29.4* 25.9*   Recent Labs    06/22/22 0502 06/23/22 0835 06/24/22 0421  NA 136 139 139  K 4.1 4.1 3.5  CL 104 106 107  CO2 20* 19* 21*  GLUCOSE 93 81 92  BUN 64* 69* 68*  CREATININE 2.68* 2.25* 1.93*  CALCIUM 8.0*  8.0* 8.2*   No results for input(s): "LABPT", "INR" in the last 72 hours. No results for input(s): "LABURIN" in the last 72 hours. Results for orders placed or performed during the hospital encounter of 06/21/22  Blood Culture (routine x 2)     Status: Abnormal   Collection Time: 06/21/22  4:00 PM   Specimen: BLOOD RIGHT FOREARM  Result Value Ref Range Status   Specimen Description   Final    BLOOD RIGHT FOREARM Performed at [facility], [address] [place], [place] [address]    Special Requests   Final    BOTTLES DRAWN AEROBIC AND ANAEROBIC Blood Culture adequate volume Performed at [facility], [address] [place], [place] [address]    Culture  Setup Time   Final    IN BOTH AEROBIC AND  ANAEROBIC BOTTLES GRAM NEGATIVE RODS CRITICAL RESULT CALLED TO, READ BACK BY AND VERIFIED WITHDawayne Cirri @ 1610 06/22/22 LFD Performed at [facility], [address] [place], [place] [address]    Culture ESCHERICHIA COLI (A)  Final   Report Status 06/24/2022 FINAL  Final   Organism ID, Bacteria ESCHERICHIA COLI  Final      Susceptibility   Escherichia coli - MIC*    AMPICILLIN 8 SENSITIVE Sensitive     CEFEPIME <=0.12 SENSITIVE Sensitive     CEFTAZIDIME <=1 SENSITIVE Sensitive     CEFTRIAXONE <=0.25 SENSITIVE Sensitive     CIPROFLOXACIN <=0.25 SENSITIVE Sensitive     GENTAMICIN <=1 SENSITIVE Sensitive     IMIPENEM <=0.25 SENSITIVE Sensitive     TRIMETH/SULFA <=20 SENSITIVE Sensitive     AMPICILLIN/SULBACTAM 4 SENSITIVE Sensitive     PIP/TAZO <=4 SENSITIVE Sensitive     * ESCHERICHIA COLI  Blood Culture ID Panel (Reflexed)     Status: Abnormal   Collection Time: 06/21/22  4:00 PM  Result Value Ref Range Status   Enterococcus faecalis NOT DETECTED NOT DETECTED Final   Enterococcus Faecium NOT DETECTED NOT DETECTED Final   Listeria monocytogenes NOT DETECTED NOT DETECTED Final   Staphylococcus species NOT DETECTED NOT DETECTED Final   Staphylococcus  aureus (BCID) NOT DETECTED NOT DETECTED Final   Staphylococcus epidermidis NOT DETECTED NOT DETECTED Final   Staphylococcus lugdunensis NOT DETECTED NOT DETECTED Final   Streptococcus species NOT DETECTED NOT DETECTED Final   Streptococcus agalactiae NOT DETECTED NOT DETECTED Final   Streptococcus pneumoniae NOT DETECTED NOT DETECTED Final   Streptococcus pyogenes NOT DETECTED NOT DETECTED Final   A.calcoaceticus-baumannii NOT DETECTED NOT DETECTED Final   Bacteroides fragilis NOT DETECTED NOT DETECTED Final   Enterobacterales DETECTED (A) NOT DETECTED Final    Comment: Enterobacterales represent a large order of gram negative bacteria, not a single organism. CRITICAL RESULT CALLED TO, READ BACK BY AND VERIFIED WITH: NATHAN BELUE @ 0519 06/22/22 LFD    Enterobacter cloacae complex NOT DETECTED NOT DETECTED Final   Escherichia coli DETECTED (A) NOT DETECTED Final    Comment: CRITICAL RESULT CALLED TO, READ BACK BY AND VERIFIED WITH: NATHAN BELUE @ 0519 06/22/22 LFD    Klebsiella aerogenes NOT DETECTED NOT DETECTED Final   Klebsiella oxytoca NOT DETECTED NOT DETECTED Final   Klebsiella pneumoniae NOT DETECTED NOT DETECTED Final   Proteus species NOT DETECTED NOT DETECTED Final   Salmonella species NOT DETECTED NOT DETECTED Final   Serratia marcescens NOT DETECTED NOT DETECTED Final   Haemophilus influenzae NOT DETECTED NOT DETECTED Final   Neisseria meningitidis NOT DETECTED NOT DETECTED Final   Pseudomonas aeruginosa NOT DETECTED NOT DETECTED Final   Stenotrophomonas maltophilia NOT DETECTED NOT DETECTED Final   Candida albicans NOT DETECTED NOT DETECTED Final   Candida auris NOT DETECTED NOT DETECTED Final   Candida glabrata NOT DETECTED NOT DETECTED Final   Candida krusei NOT DETECTED NOT DETECTED Final   Candida parapsilosis NOT DETECTED NOT DETECTED Final   Candida tropicalis NOT DETECTED NOT DETECTED Final   Cryptococcus neoformans/gattii NOT DETECTED NOT DETECTED Final    CTX-M ESBL NOT DETECTED NOT DETECTED Final   Carbapenem resistance IMP NOT DETECTED NOT DETECTED Final   Carbapenem resistance KPC NOT DETECTED NOT DETECTED Final   Carbapenem resistance NDM NOT DETECTED NOT DETECTED Final   Carbapenem resist OXA 48 LIKE NOT DETECTED NOT DETECTED Final   Carbapenem resistance VIM NOT DETECTED NOT DETECTED Final  Comment: Performed at [facility], [address], [place], [place] [address]  Blood Culture (routine x 2)     Status: Abnormal   Collection Time: 06/21/22  4:01 PM   Specimen: BLOOD  Result Value Ref Range Status   Specimen Description   Final    BLOOD RIGHT ANTECUBITAL Performed at [facility], [address] [place], [place] [address]    Special Requests   Final    BOTTLES DRAWN AEROBIC AND ANAEROBIC Blood Culture results may not be optimal due to an excessive volume of blood received in culture bottles Performed at [facility], [address], [place], [place] [address]    Culture  Setup Time   Final    GRAM NEGATIVE RODS IN BOTH AEROBIC AND ANAEROBIC BOTTLES CRITICAL RESULT CALLED TO, READ BACK BY AND VERIFIED WITH: Dawayne Cirri 0102 06/22/22 LFD Performed at [facility], [address] [place], [place] [address]    Culture (A)  Final    ESCHERICHIA COLI SUSCEPTIBILITIES PERFORMED ON PREVIOUS CULTURE WITHIN THE LAST 5 DAYS. Performed at [facility], [address], [place], [place] [address]    Report Status 06/24/2022 FINAL  Final  Resp panel by RT-PCR (RSV, Flu A&B, Covid)     Status: None   Collection Time: 06/21/22  4:01 PM  Result Value Ref Range Status   SARS Coronavirus 2 by RT PCR NEGATIVE NEGATIVE Final    Comment: (NOTE) SARS-CoV-2 target nucleic acids are NOT DETECTED.  The SARS-CoV-2 RNA is generally detectable in upper respiratory specimens during the acute phase of infection. The lowest concentration of SARS-CoV-2 viral copies this assay can detect is 138  copies/mL. A negative result does not preclude SARS-Cov-2 infection and should not be used as the sole basis for treatment or other patient management decisions. A negative result may occur with  improper specimen collection/handling, submission of specimen other than nasopharyngeal swab, presence of viral mutation(s) within the areas targeted by this assay, and inadequate number of viral copies(<138 copies/mL). A negative result must be combined with clinical observations, patient history, and epidemiological information. The expected result is Negative.  Fact Sheet for Patients:  BloggerCourse.com  Fact Sheet for Healthcare Providers:  SeriousBroker.it  This test is no t yet approved or cleared by the [place] FDA and  has been authorized for detection and/or diagnosis of SARS-CoV-2 by FDA under an Emergency Use Authorization (EUA). This EUA will remain  in effect (meaning this test can be used) for the duration of the COVID-19 declaration under Section 564(b)(1) of the Act, 21 U.S.C.section 360bbb-3(b)(1), unless the authorization is terminated  or revoked sooner.       Influenza A by PCR NEGATIVE NEGATIVE Final   Influenza B by PCR NEGATIVE NEGATIVE Final    Comment: (NOTE) The Xpert Xpress SARS-CoV-2/FLU/RSV plus assay is intended as an aid in the diagnosis of influenza from Nasopharyngeal swab specimens and should not be used as a sole basis for treatment. Nasal washings and aspirates are unacceptable for Xpert Xpress SARS-CoV-2/FLU/RSV testing.  Fact Sheet for Patients: BloggerCourse.com  Fact Sheet for Healthcare Providers: SeriousBroker.it  This test is not yet approved or cleared by the [place] FDA and has been authorized for detection and/or diagnosis of SARS-CoV-2 by FDA under an Emergency Use Authorization (EUA). This EUA will remain in effect (meaning  this test can be used) for the duration of the COVID-19 declaration under Section 564(b)(1) of the Act, 21 U.S.C. section 360bbb-3(b)(1),  unless the authorization is terminated or revoked.     Resp Syncytial Virus by PCR NEGATIVE NEGATIVE Final    Comment: (NOTE) Fact Sheet for Patients: BloggerCourse.com  Fact Sheet for Healthcare Providers: SeriousBroker.it  This test is not yet approved or cleared by the [place] FDA and has been authorized for detection and/or diagnosis of SARS-CoV-2 by FDA under an Emergency Use Authorization (EUA). This EUA will remain in effect (meaning this test can be used) for the duration of the COVID-19 declaration under Section 564(b)(1) of the Act, 21 U.S.C. section 360bbb-3(b)(1), unless the authorization is terminated or revoked.  Performed at [facility], [address] [place], [place] [address]   Urine Culture     Status: Abnormal   Collection Time: 06/21/22  7:47 PM   Specimen: Urine, Random  Result Value Ref Range Status   Specimen Description   Final    URINE, RANDOM Performed at [facility], [address] [place], [place] [address]    Special Requests   Final    NONE Reflexed from [phone] Performed at [facility], [address] [place], [place] [address]    Culture MULTIPLE SPECIES PRESENT, SUGGEST RECOLLECTION (A)  Final   Report Status 06/23/2022 FINAL  Final  MRSA Next Gen by PCR, Nasal     Status: Abnormal   Collection Time: 06/21/22 11:02 PM   Specimen: Nasal Mucosa; Nasal Swab  Result Value Ref Range Status   MRSA by PCR Next Gen DETECTED (A) NOT DETECTED Final    Comment: CRITICAL RESULT CALLED TO, READ BACK BY AND VERIFIED WITH: SCRIVNER,IVAN RN @ 0210 06/22/22 LFD Performed at [facility], [address] [place], [place] [address]      Radiologic Imaging: CT images were personally reviewed and interpreted  US  Abdomen Limited RUQ (LIVER/GB)  Result Date: 06/24/2022 CLINICAL DATA:  Abdominal pain EXAM: ULTRASOUND ABDOMEN LIMITED RIGHT UPPER QUADRANT COMPARISON:  None Available. FINDINGS: Gallbladder: Dilated gallbladder. Borderline wall thickening. No shadowing stones. There is some sludge. There is some adjacent fluid as well to the gallbladder. Common bile duct: Diameter: 6 mm Liver: Nodular contours of the liver with mild ascites. Right-sided pleural effusion. Portal vein is patent on color Doppler imaging with normal direction of blood flow towards the liver. Other: None. IMPRESSION: Distended gallbladder with sludge.  No stones. There is some wall thickening and adjacent fluid although there is a nodular appearance of the liver with the ascites. Please correlate for chronic liver disease. Overall if there is further concern of acalculous cholecystitis with the dilatation and wall thickening, HIDA scan may be useful as the next step in the workup Electronically Signed   By: Karen Kays M.D.   On: 06/24/2022 12:05   CT RENAL STONE STUDY  Result Date: 06/23/2022 CLINICAL DATA:  Abdominal/flank pain, stone suspected EXAM: CT ABDOMEN AND PELVIS WITHOUT CONTRAST TECHNIQUE: Multidetector CT imaging of the abdomen and pelvis was performed following the standard protocol without IV contrast. RADIATION DOSE REDUCTION: This exam was performed according to the departmental dose-optimization program which includes automated exposure control, adjustment of the mA and/or kV according to patient size and/or use of iterative reconstruction technique. COMPARISON:  Contrast enhanced CT 04/13/2021 FINDINGS: Lower chest: Small to moderate bilateral pleural effusions. Associated bibasilar airspace disease. Additional airspace disease in the lingula and right middle lobe is chronic when compared with prior abdominal CT. There are coronary artery calcifications. Mitral annulus calcifications. The heart is enlarged. Hepatobiliary: Motion  artifact  through the liver, allowing for this, no evidence of focal liver lesion. The gallbladder is distended. Assessment for pericholecystic inflammation is limited due to motion through this region. The tiny gallstones on prior exam are not definitively seen. The common bile duct and biliary tree are difficult to define. There is no obvious biliary dilatation. Pancreas: Parenchymal atrophy. Motion artifact through the proximal pancreas. There is no pancreatic inflammation. Spleen: Normal in size without focal abnormality. Adrenals/Urinary Tract: No adrenal nodule. There is a 2 cm stone in the left renal pelvis extending into the proximal ureter with moderate hydronephrosis. 14 mm no obstructing stone in the lower pole of the left kidney and 3 mm nonobstructing stone in the upper pole of the left kidney. No significant left perinephric edema. Right renal cortical atrophy. Calcification in the posterior mid upper kidney likely represents a combination of parenchymal calcification a nonobstructing stone, linear 11 mm calcification may represent a stone fragment in the renal collecting system. Previous proximal right ureteral stone is no longer seen. There is mild right perirenal scarring. No definite ureteral calculi. Nondistended urinary bladder. No bladder stone. Stomach/Bowel: No bowel obstruction or inflammation. The appendix not confidently visualized, no appendicitis. Small to moderate volume of stool throughout the colon. Occasional colonic diverticula, no diverticulitis. Vascular/Lymphatic: Moderate aortic atherosclerosis. Aortic tortuosity but no aneurysm. Assessment for adenopathy is limited in the absence of contrast and patient motion. No obvious bulky enlarged lymph nodes. Reproductive: Hysterectomy. Small amount of air in the vagina is nonspecific. No adnexal mass. Other: Small volume of free fluid in the pelvis. Trace free fluid in the right upper quadrant. There is mild body wall subcutaneous edema.  No free intra-abdominal air. Musculoskeletal: Interbody spacer at L4-L5. Mild compression deformities versus Schmorl's nodes within superior endplate of T11, L1 and L3, chronic. The bones are subjectively under mineralized. Left proximal femur surgical hardware is partially included. No acute osseous findings. IMPRESSION: 1. Obstructing 2 cm stone in the left renal pelvis extending into the proximal ureter with moderate hydronephrosis. Additional nonobstructing stones in the left kidney. 2. Mild right renal scarring. Calcifications in the right kidney may be combination of parenchymal calcifications and stone fragments. 3. Hydropic gallbladder. Assessment for pericholecystic inflammation is limited due to motion through this region. The tiny gallstones on prior exam are not definitively seen. If there is clinical concern for acute gallbladder pathology, recommend right upper quadrant ultrasound. 4. Small to moderate bilateral pleural effusions with adjacent airspace disease, likely atelectasis. Additional airspace disease in the lingula and right middle lobe is chronic when compared with prior abdominal CT. 5. Small volume of free fluid in the pelvis and right upper quadrant, nonspecific. Mild body wall edema. Aortic Atherosclerosis (ICD10-I70.0). Electronically Signed   By: Narda Rutherford M.D.   On: 06/23/2022 18:18    Impression/Assessment:  77 y.o. female admitted 06/21/2022 with sepsis from a urinary source.  She responded to antibiotic therapy and continues to improve CT not performed on admission but performed yesterday showing an obstructing left renal pelvic calculus  Plan:  Improving clinically and she is not n.p.o. Recommend cystoscopy with placement left ureteral stent 06/25/2022 We discussed due to infection no attempt will be made to treat her stone and she will need a follow-up procedure for definitive stone treatment All questions were answered and she desires to proceed She asked that I call  her sister and the procedure was also discussed with her sister, Samay Rolfe   06/24/2022, 2:31 PM  Irineo Axon,  MD

## 2022-06-24 NOTE — TOC Progression Note (Signed)
Transition of Care [facility]) - Progression Note    Patient Details  Name: Alison Price MRN: [id number] Date of Birth: [birth year]  [facility] of [facility] CM/SW Contact  Allena Katz, LCSW Phone Number: 06/24/2022, 1:44 PM  Clinical Narrative:  CSW awaiting urology to consult. Recommendations are for rehab for patient. CSW will follow up with family on if they would like to pursue this.           Expected Discharge Plan and Services                                               Social Determinants of Health (SDOH) Interventions SDOH Screenings   Tobacco Use: Medium Risk (11/21/2020)    Readmission Risk Interventions     No data to display

## 2022-06-25 ENCOUNTER — Inpatient Hospital Stay: Payer: Medicare Other | Admitting: Certified Registered"

## 2022-06-25 ENCOUNTER — Encounter
Admission: EM | Disposition: A | Discharge status: Skilled Nursing Facility | Payer: Self-pay | Source: Home / Self Care | Attending: Family Medicine

## 2022-06-25 ENCOUNTER — Inpatient Hospital Stay: Payer: Medicare Other

## 2022-06-25 ENCOUNTER — Encounter: Payer: Self-pay | Admitting: *Deleted

## 2022-06-25 DIAGNOSIS — R4182 Altered mental status, unspecified: Secondary | ICD-10-CM

## 2022-06-25 DIAGNOSIS — E872 Acidosis, unspecified: Secondary | ICD-10-CM | POA: Diagnosis not present

## 2022-06-25 DIAGNOSIS — R6520 Severe sepsis without septic shock: Secondary | ICD-10-CM | POA: Diagnosis not present

## 2022-06-25 DIAGNOSIS — N200 Calculus of kidney: Secondary | ICD-10-CM | POA: Diagnosis not present

## 2022-06-25 DIAGNOSIS — N132 Hydronephrosis with renal and ureteral calculous obstruction: Secondary | ICD-10-CM

## 2022-06-25 DIAGNOSIS — I480 Paroxysmal atrial fibrillation: Secondary | ICD-10-CM

## 2022-06-25 DIAGNOSIS — F0390 Unspecified dementia without behavioral disturbance: Secondary | ICD-10-CM

## 2022-06-25 DIAGNOSIS — A419 Sepsis, unspecified organism: Secondary | ICD-10-CM | POA: Diagnosis not present

## 2022-06-25 HISTORY — PX: CYSTOSCOPY WITH URETEROSCOPY AND STENT PLACEMENT: SHX6377

## 2022-06-25 LAB — BASIC METABOLIC PANEL
Anion gap: 9 (ref 5–15)
BUN: 63 mg/dL — ABNORMAL HIGH (ref 8–23)
CO2: 23 mmol/L (ref 22–32)
Calcium: 8.2 mg/dL — ABNORMAL LOW (ref 8.9–10.3)
Chloride: 109 mmol/L (ref 98–111)
Creatinine, Ser: 1.62 mg/dL — ABNORMAL HIGH (ref 0.44–1.00)
GFR, Estimated: 33 mL/min — ABNORMAL LOW (ref >60)
Glucose, Bld: 94 mg/dL (ref 70–99)
Potassium: 3.5 mmol/L (ref 3.5–5.1)
Sodium: 141 mmol/L (ref 135–145)

## 2022-06-25 LAB — CBC
HCT: 28.7 % — ABNORMAL LOW (ref 36.0–46.0)
Hemoglobin: 9.5 g/dL — ABNORMAL LOW (ref 12.0–15.0)
MCH: 31 pg (ref 26.0–34.0)
MCHC: 33.1 g/dL (ref 30.0–36.0)
MCV: 93.8 fL (ref 80.0–100.0)
Platelets: 72 10*3/uL — ABNORMAL LOW (ref 150–400)
RBC: 3.06 MIL/uL — ABNORMAL LOW (ref 3.87–5.11)
RDW: 14.5 % (ref 11.5–15.5)
WBC: 8.7 10*3/uL (ref 4.0–10.5)
nRBC: 0 % (ref 0.0–0.2)

## 2022-06-25 LAB — LACTIC ACID, PLASMA: Lactic Acid, Venous: 1.2 mmol/L (ref 0.5–1.9)

## 2022-06-25 LAB — PROCALCITONIN: Procalcitonin: 33.99 ng/mL

## 2022-06-25 LAB — PHOSPHORUS: Phosphorus: 3.8 mg/dL (ref 2.5–4.6)

## 2022-06-25 LAB — MAGNESIUM: Magnesium: 1.9 mg/dL (ref 1.7–2.4)

## 2022-06-25 SURGERY — CYSTOURETEROSCOPY, WITH STENT INSERTION
Anesthesia: General | Site: Ureter | Laterality: Left

## 2022-06-25 MED ORDER — FENTANYL CITRATE (PF) 100 MCG/2ML IJ SOLN
INTRAMUSCULAR | Status: AC
  Filled 2022-06-25: qty 2

## 2022-06-25 MED ORDER — SODIUM CHLORIDE 0.9 % IR SOLN
Status: DC | PRN
  Administered 2022-06-25: 3000 mL

## 2022-06-25 MED ORDER — OXYCODONE HCL 5 MG/5ML PO SOLN: 5.0000 mg | Freq: Once | ORAL | Status: DC | PRN

## 2022-06-25 MED ORDER — FENTANYL CITRATE (PF) 100 MCG/2ML IJ SOLN
INTRAMUSCULAR | Status: DC | PRN
  Administered 2022-06-25: 25 ug via INTRAVENOUS
  Administered 2022-06-25: 50 ug via INTRAVENOUS
  Administered 2022-06-25: 25 ug via INTRAVENOUS

## 2022-06-25 MED ORDER — STERILE WATER FOR IRRIGATION IR SOLN
Status: DC | PRN
  Administered 2022-06-25: 500 mL

## 2022-06-25 MED ORDER — PROPOFOL 1000 MG/100ML IV EMUL
INTRAVENOUS | Status: AC
  Filled 2022-06-25: qty 100

## 2022-06-25 MED ORDER — METOPROLOL TARTRATE 5 MG/5ML IV SOLN
INTRAVENOUS | Status: AC
  Filled 2022-06-25: qty 5

## 2022-06-25 MED ORDER — OXYCODONE HCL 5 MG PO TABS: 5.0000 mg | ORAL_TABLET | Freq: Once | ORAL | Status: DC | PRN

## 2022-06-25 MED ORDER — LIDOCAINE HCL (CARDIAC) PF 100 MG/5ML IV SOSY
PREFILLED_SYRINGE | INTRAVENOUS | Status: DC | PRN
  Administered 2022-06-25: 40 mg via INTRAVENOUS
  Administered 2022-06-25: 60 mg via INTRAVENOUS

## 2022-06-25 MED ORDER — PROPOFOL 10 MG/ML IV BOLUS
INTRAVENOUS | Status: AC
  Filled 2022-06-25: qty 20

## 2022-06-25 MED ORDER — METOPROLOL TARTRATE 5 MG/5ML IV SOLN
5.0000 mg | Freq: Once | INTRAVENOUS | Status: AC
  Administered 2022-06-25: 5 mg via INTRAVENOUS

## 2022-06-25 MED ORDER — AMIODARONE HCL 200 MG PO TABS
400.0000 mg | ORAL_TABLET | Freq: Two times a day (BID) | ORAL | Status: DC
  Administered 2022-06-25 – 2022-06-28 (×7): 400 mg via ORAL
  Filled 2022-06-25 (×7): qty 2

## 2022-06-25 MED ORDER — ESMOLOL HCL 100 MG/10ML IV SOLN
INTRAVENOUS | Status: DC | PRN
  Administered 2022-06-25: 30 mg via INTRAVENOUS

## 2022-06-25 MED ORDER — IOHEXOL 180 MG/ML  SOLN
INTRAMUSCULAR | Status: DC | PRN
  Administered 2022-06-25: 10 mL

## 2022-06-25 MED ORDER — PROPOFOL 10 MG/ML IV BOLUS
INTRAVENOUS | Status: DC | PRN
  Administered 2022-06-25: 100 ug/kg/min via INTRAVENOUS
  Administered 2022-06-25: 30 mg via INTRAVENOUS

## 2022-06-25 MED ORDER — FENTANYL CITRATE (PF) 100 MCG/2ML IJ SOLN: 25.0000 ug | INTRAMUSCULAR | Status: DC | PRN

## 2022-06-25 MED ORDER — ONDANSETRON HCL 4 MG/2ML IJ SOLN
INTRAMUSCULAR | Status: DC | PRN
  Administered 2022-06-25: 4 mg via INTRAVENOUS

## 2022-06-25 MED ORDER — PHENYLEPHRINE HCL (PRESSORS) 10 MG/ML IV SOLN
INTRAVENOUS | Status: DC | PRN
  Administered 2022-06-25 (×2): 80 ug via INTRAVENOUS

## 2022-06-25 SURGICAL SUPPLY — 18 items
BRUSH SCRUB EZ 1% IODOPHOR (MISCELLANEOUS) ×1 IMPLANT
CATH URETL OPEN 5X70 (CATHETERS) IMPLANT
GLOVE BIOGEL PI IND STRL 7.5 (GLOVE) ×1 IMPLANT
GOWN STRL REUS W/ TWL LRG LVL3 (GOWN DISPOSABLE) ×1 IMPLANT
GOWN STRL REUS W/ TWL XL LVL3 (GOWN DISPOSABLE) ×1 IMPLANT
GOWN STRL REUS W/TWL LRG LVL3 (GOWN DISPOSABLE) ×1
GOWN STRL REUS W/TWL XL LVL3 (GOWN DISPOSABLE) ×1
GUIDEWIRE STR DUAL SENSOR (WIRE) ×1 IMPLANT
IV NS IRRIG 3000ML ARTHROMATIC (IV SOLUTION) ×1 IMPLANT
KIT TURNOVER CYSTO (KITS) ×1 IMPLANT
PACK CYSTO AR (MISCELLANEOUS) ×1 IMPLANT
SET CYSTO W/LG BORE CLAMP LF (SET/KITS/TRAYS/PACK) ×1 IMPLANT
STENT URET 6FRX24 CONTOUR (STENTS) IMPLANT
STENT URET 6FRX26 CONTOUR (STENTS) IMPLANT
SURGILUBE 2OZ TUBE FLIPTOP (MISCELLANEOUS) ×1 IMPLANT
SYR 10ML LL (SYRINGE) ×1 IMPLANT
TRAP FLUID SMOKE EVACUATOR (MISCELLANEOUS) ×1 IMPLANT
WATER STERILE IRR 500ML POUR (IV SOLUTION) ×1 IMPLANT

## 2022-06-25 NOTE — Plan of Care (Signed)

## 2022-06-25 NOTE — Anesthesia Procedure Notes (Signed)
Procedure Name: LMA Insertion Date/Time: 06/25/2022 7:25 AM  Performed by: Jaye Beagle, CRNAPre-anesthesia Checklist: Patient identified, Emergency Drugs available, Suction available and Patient being monitored Patient Re-evaluated:Patient Re-evaluated prior to induction Oxygen Delivery Method: Circle system utilized Preoxygenation: Pre-oxygenation with 100% oxygen Induction Type: IV induction Ventilation: Mask ventilation without difficulty LMA Size: 4.0 Tube type: Oral Number of attempts: 1 Airway Equipment and Method: Stylet and Oral airway Placement Confirmation: ETT inserted through vocal cords under direct vision, positive ETCO2 and breath sounds checked- equal and bilateral Tube secured with: Tape Dental Injury: Teeth and Oropharynx as per pre-operative assessment

## 2022-06-25 NOTE — Progress Notes (Signed)
Patient is on LR 13mL/hr has bilateral coarse crackles to the bases of her lungs. RN placed the patient on 2L [place] as it appeared she was having an increased work of breathing while RN and NT were cleaning her up and she was laying fairly flat. Patient's O2 was 90 -91% RA now 95-100 on 2L [place].   RN notified RT and Bishop Limbo, NP of the above information.   Orders Received: notify NP of RT's suggestions  RT states the patient does not appear to be in acute distress, RR and O2 saturation WNL. Crackles heard to bases of lungs.   RN notified Bishop Limbo, NP of the RT's assessment.  Orders Received: RN to monitor.

## 2022-06-25 NOTE — Anesthesia Postprocedure Evaluation (Signed)
Anesthesia Post Note  Patient: [employer]  Procedure(s) Performed: CYSTOSCOPY WITH URETEROSCOPY AND STENT PLACEMENT (Left: Ureter)  Patient location during evaluation: PACU Anesthesia Type: General Level of consciousness: awake and alert Pain management: pain level controlled Vital Signs Assessment: post-procedure vital signs reviewed and stable Respiratory status: spontaneous breathing, nonlabored ventilation, respiratory function stable and patient connected to nasal cannula oxygen Cardiovascular status: blood pressure returned to baseline and stable Postop Assessment: no apparent nausea or vomiting Anesthetic complications: no Comments: Developed afib w/ RVR post-op, received 10mg  metoprolol, HR 60s-70s. Sent back to floor on telemetry    There were no known notable events for this encounter.   Last Vitals:  Vitals:   06/25/22 0849 06/25/22 0850  BP:  125/82  Pulse: 75 66  Resp: 17 14  Temp:    SpO2: 97% 99%    Last Pain:  Vitals:   06/25/22 0131  TempSrc: Oral                 Louie Boston

## 2022-06-25 NOTE — Progress Notes (Signed)
Occupational Therapy Treatment Patient Details Name: Alison Price MRN: [id number] DOB: Dec 02, 1945 Today's Date: 06/25/2022   History of present illness Patient is a 77 yo female that presented to ED for AMS, workup for sepsis, acute renal insufficiency and afib. PMH of anemia, GERD, HTN, stevens-johnson syndrome.  S/P CYSTOSCOPY WITH URETEROSCOPY AND STENT PLACEMENT 5/8.   OT comments  Patient received in bed and agreeable to OT. Pt oriented to first name only and inconsistently followed single step commands. OT noting L lateral lean while in bed and provided Max A to correct posture. +2 required to scoot pt up toward [facility]. Pt was then instructed in BUE AROM exercises while sitting up in bed (see details below). Pt demonstrated limited ability to participate in mobility this date due to cognition. Pillows provided to encourage upright midline position at end of session. Pt left as received with all needs in reach. Pt is making progress toward goal completion. D/C recommendation remains appropriate. OT will continue to follow acutely.    Recommendations for follow up therapy are one component of a multi-disciplinary discharge planning process, led by the attending physician.  Recommendations may be updated based on patient status, additional functional criteria and insurance authorization.    Assistance Recommended at Discharge Frequent or constant Supervision/Assistance  Patient can return home with the following  A lot of help with bathing/dressing/bathroom;Assistance with cooking/housework;Assist for transportation;Help with stairs or ramp for entrance;Direct supervision/assist for financial management;Direct supervision/assist for medications management;Assistance with feeding;Two people to help with walking and/or transfers   Equipment Recommendations  Other (comment) (defer to next venue of care)    Recommendations for Other Services      Precautions / Restrictions  Precautions Precautions: Fall Restrictions Weight Bearing Restrictions: No       Mobility Bed Mobility Overal bed mobility: Needs Assistance Bed Mobility: Rolling Rolling: Max assist         General bed mobility comments: pt with limited ability to participate in mobility    Transfers Overall transfer level: Needs assistance                 General transfer comment: deferred     Balance Overall balance assessment: Needs assistance     Sitting balance - Comments: Pt with L lateral lean while supine in bed almost hanging over bed rail. Max A required to correct posture and then +2 to scoot pt up toward HOB. Pillows provided to encourage upright midline position.     ADL either performed or assessed with clinical judgement   ADL Overall ADL's : Needs assistance/impaired            Extremity/Trunk Assessment              Vision Patient Visual Report: No change from baseline     Perception     Praxis      Cognition Arousal/Alertness: Awake/alert Behavior During Therapy: Restless, Anxious Overall Cognitive Status: History of cognitive impairments - at baseline     General Comments: Pt oriented to first name only, unable to tell therapist DOB or how old she is. Inconsistently followed single step commands, perseverating on going home.        Exercises Other Exercises Other Exercises: OT attempted to instruct pt in BUE AROM exercises while sitting up in bed. Physical assistance required to initiate along with modeling by therapist. Pt completed shoulder flexion, elbow flexion/extension, and shoulder horizontal abduction/adduction x10 each.    Shoulder Instructions       General  Comments      Pertinent Vitals/ Pain       Pain Assessment Pain Assessment: Faces Faces Pain Scale: No hurt  Home Living            Prior Functioning/Environment              Frequency  Min 1X/week        Progress Toward Goals  OT Goals(current  goals can now be found in the care plan section)  Progress towards OT goals: OT to reassess next treatment  Acute Rehab OT Goals Patient Stated Goal: none stated OT Goal Formulation: Patient unable to participate in goal setting Time For Goal Achievement: 07/08/22 Potential to Achieve Goals: Fair  Plan Discharge plan remains appropriate;Frequency remains appropriate    Co-evaluation                 AM-PAC OT "6 Clicks" Daily Activity     Outcome Measure   Help from another person eating meals?: A Lot Help from another person taking care of personal grooming?: A Lot Help from another person toileting, which includes using toliet, bedpan, or urinal?: Total Help from another person bathing (including washing, rinsing, drying)?: A Lot Help from another person to put on and taking off regular upper body clothing?: A Lot Help from another person to put on and taking off regular lower body clothing?: Total 6 Click Score: 10    End of Session    OT Visit Diagnosis: Unsteadiness on feet (R26.81);Repeated falls (R29.6);Muscle weakness (generalized) (M62.81)   Activity Tolerance Other (comment) (limited 2/2 cognition)   Patient Left in bed;with call bell/phone within reach;with bed alarm set   Nurse Communication Mobility status        Time: 4696-2952 OT Time Calculation (min): 12 min  Charges: OT General Charges $OT Visit: 1 Visit OT Treatments $Therapeutic Activity: 8-22 mins  [facility] MS, OTR/L ascom [phone]  06/25/22, 5:12 PM

## 2022-06-25 NOTE — Consult Note (Signed)
Cardiology Consultation   Patient ID: NAKERIA MARSHALL MRN: [id number]; DOB: 1945/10/28  Admit date: 06/21/2022 Date of Consult: 06/25/2022  PCP:  Jaclyn Shaggy, MD   [facility] Providers Cardiologist:  None   {   Patient Profile:   Alison Price is a 77 y.o. female with a hx of dementia, HTN, GERD, nephrolithiasis, recurrent falls who is being seen 06/25/2022 for the evaluation of Afib at the request of Dr. Idelle Leech.  History of Present Illness:   Ms. Rowinski has not been seen by cardiology in the past. History obtained by chart review given dementia.  The patient underwent right uretal stent placement for an obstructing right proximal uretal stone with sepsis in Feb 2022, and  subsequently underwent ureteroscopic stone removal 04/2020.   She presented to the ED 06/21/22 with AMS. She was hypotensive, tachycardic noted to have leukocytosis and AKI and was admitted to the ICU for severe sepsis from UTI, new onset Afib, AMS, AKI and possible PNA.  Afib with heart rates in the 170s, started on IV amiodarone drip with conversion to NSR. IV amio was subsequently stopped 5/5. Urine culture showed E.Coli. She was transferred out of the ICU 06/22/22. CT scan showed obstructing 2 cm left renal stone with moderate hydronephrosis. She underwent left placement today 5/8.  Post-procedure she was noted to have recurrent Afib given IV metoprolol with conversion to NSR.   Past Medical History:  Diagnosis Date   Anemia    Arthritis    Chronic pain    Complication of anesthesia    Dyspnea    GERD (gastroesophageal reflux disease)    History of kidney stones    Hypertension    Osteoporosis    Pneumonia    PONV (postoperative nausea and vomiting)    Stevens-Johnson syndrome (HCC)     Past Surgical History:  Procedure Laterality Date   ABDOMINAL HYSTERECTOMY     partial   BACK SURGERY     CYSTOSCOPY WITH STENT PLACEMENT Right 04/13/2020   Procedure: CYSTOSCOPY WITH STENT  PLACEMENT;  Surgeon: Sondra Come, MD;  Location: [facility] ORS;  Service: Urology;  Laterality: Right;   CYSTOSCOPY/URETEROSCOPY/HOLMIUM LASER/STENT PLACEMENT Right 04/27/2020   Procedure: CYSTOSCOPY/URETEROSCOPY/HOLMIUM LASER/STENT EXCHANGE;  Surgeon: Sondra Come, MD;  Location: [facility] ORS;  Service: Urology;  Laterality: Right;   ESOPHAGOGASTRODUODENOSCOPY N/A 06/30/2019   Procedure: ESOPHAGOGASTRODUODENOSCOPY (EGD);  Surgeon: Toledo, Boykin Nearing, MD;  Location: [facility] ENDOSCOPY;  Service: Gastroenterology;  Laterality: N/A;   FOOT SURGERY     FRACTURE SURGERY     left hip repair   HAND SURGERY     THUMB REPAIR   HEMORRHOID SURGERY     INTRAMEDULLARY (IM) NAIL INTERTROCHANTERIC Left 08/06/2017   Procedure: INTRAMEDULLARY (IM) NAIL INTERTROCHANTRIC;  Surgeon: Signa Kell, MD;  Location: [facility] ORS;  Service: Orthopedics;  Laterality: Left;   POSTERIOR REPAIR       Home Medications:  Prior to Admission medications   Medication Sig Start Date End Date Taking? Authorizing Provider  acetaminophen (TYLENOL) 325 MG tablet Take 2 tablets (650 mg total) by mouth every 6 (six) hours. 11/08/20  Yes Leeroy Bock, MD  cholecalciferol (VITAMIN D3) 25 MCG (1000 UNIT) tablet Take 2,000 Units by mouth daily.   Yes [provider]  donepezil (ARICEPT) 10 MG tablet Take 10 mg by mouth daily. 10/08/20  Yes [provider]  escitalopram (LEXAPRO) 5 MG tablet Take 5 mg by mouth daily.   Yes [provider]  ferrous  sulfate 324 MG TBEC Take 324 mg by mouth daily with breakfast.   Yes [provider]  lisinopril (ZESTRIL) 10 MG tablet Take 10 mg by mouth daily.   Yes [provider]  Melatonin 10 MG TABS Take 10 mg by mouth at bedtime.   Yes [provider]  Multiple Vitamin (MULTIVITAMIN) tablet Take 1 tablet by mouth daily.   Yes [provider]  ondansetron (ZOFRAN) 4 MG tablet Take 4 mg by mouth every 8 (eight) hours as needed for nausea or  vomiting. Takes 1-2 times daily with pain medication   Yes [provider]  Oxycodone HCl 10 MG TABS Take 1 tablet (10 mg total) by mouth 3 (three) times daily as needed. 11/08/20  Yes Leeroy Bock, MD  sennosides-docusate sodium (SENOKOT-S) 8.6-50 MG tablet Take 1 tablet by mouth daily. Takes with pain medication   Yes [provider]    Inpatient Medications: Scheduled Meds:  Chlorhexidine Gluconate Cloth  6 each Topical Q0600   cholecalciferol  2,000 Units Oral Daily   donepezil  10 mg Oral QHS   escitalopram  5 mg Oral Daily   feeding supplement  237 mL Oral BID BM   ferrous sulfate  324 mg Oral Q breakfast   heparin  5,000 Units Subcutaneous Q8H   melatonin  10 mg Oral QHS   multivitamin with minerals  1 tablet Oral Daily   mupirocin ointment  1 Application Nasal BID   senna-docusate  1 tablet Oral Daily   Continuous Infusions:  sodium chloride 10 mL/hr at 06/22/22 0900   cefTRIAXone (ROCEPHIN)  IV 2 g (06/25/22 0944)   lactated ringers 75 mL/hr at 06/25/22 1021   PRN Meds: acetaminophen, ondansetron, oxyCODONE, polyethylene glycol, senna-docusate  Allergies:    Allergies  Allergen Reactions   Codeine Nausea And Vomiting   Sulfa Antibiotics Other (See Comments)    Trudie Buckler SYNDROME    Social History:   Social History   Socioeconomic History   Marital status: Divorced    Spouse name: Not on file   Number of children: Not on file   Years of education: Not on file   Highest education level: Not on file  Occupational History   Not on file  Tobacco Use   Smoking status: Former    Types: Cigarettes    Quit date: 02/27/1986    Years since quitting: 36.3   Smokeless tobacco: Never  Vaping Use   Vaping Use: Never used  Substance and Sexual Activity   Alcohol use: No   Drug use: No   Sexual activity: Not Currently    Birth control/protection: Surgical, None  Other Topics Concern   Not on file  Social History Narrative   Not on file    Social Determinants of Health   Financial Resource Strain: Not on file  Food Insecurity: Not on file  Transportation Needs: Not on file  Physical Activity: Not on file  Stress: Not on file  Social Connections: Not on file  Intimate Partner Violence: Not on file    Family History:    Family History  Problem Relation Age of Onset   Diabetes Paternal Grandmother    Uterine cancer Maternal Aunt    Stomach cancer Maternal Aunt    Alcohol abuse Father    Breast cancer Neg Hx    Ovarian cancer Neg Hx    Colon cancer Neg Hx    Heart disease Neg Hx      ROS:  Please  see the history of present illness.   All other ROS reviewed and negative.     Physical Exam/Data:   Vitals:   06/25/22 0845 06/25/22 0846 06/25/22 0849 06/25/22 0850  BP: 106/80 99/87  125/82  Pulse: (!) 132 (!) 130 75 66  Resp: 16 13 17 14   Temp:    (!) 97 F (36.1 C)  TempSrc:      SpO2: 100% 100% 97% 99%  Weight:      Height:        Intake/Output Summary (Last 24 hours) at 06/25/2022 1124 Last data filed at 06/25/2022 0746 Gross per 24 hour  Intake 1840.59 ml  Output --  Net 1840.59 ml      06/24/2022    5:00 AM 06/22/2022    7:00 AM 06/21/2022    4:10 PM  Last 3 Weights  Weight (lbs) 130 lb 1.1 oz 128 lb 12 oz 128 lb 12 oz  Weight (kg) 59 kg 58.4 kg 58.4 kg     Body mass index is 18.66 kg/m.  General:  Well nourished, well developed, in no acute distress HEENT: normal Neck: no JVD Vascular: No carotid bruits; Distal pulses 2+ bilaterally Cardiac:  normal S1, S2; RRR; no murmur  Lungs:  clear to auscultation bilaterally, no wheezing, rhonchi or rales  Abd: soft, nontender, no hepatomegaly  Ext: no edema Musculoskeletal:  No deformities, BUE and BLE strength normal and equal Skin: warm and dry  Neuro:  CNs 2-12 intact, no focal abnormalities noted Psych:  Normal affect   EKG:  The EKG was personally reviewed and demonstrates:  EKG from 171bpm,ST depression likely rare related Telemetry:   Telemetry was personally reviewed and demonstrates:  NSR  Relevant CV Studies:  Echo ordered  Laboratory Data:  High Sensitivity Troponin:   Recent Labs  Lab 06/21/22 2301 06/21/22 2358  TROPONINIHS 85* 91*     Chemistry Recent Labs  Lab 06/22/22 0502 06/23/22 0835 06/24/22 0421 06/25/22 0622  NA 136 139 139 141  K 4.1 4.1 3.5 3.5  CL 104 106 107 109  CO2 20* 19* 21* 23  GLUCOSE 93 81 92 94  BUN 64* 69* 68* 63*  CREATININE 2.68* 2.25* 1.93* 1.62*  CALCIUM 8.0* 8.0* 8.2* 8.2*  MG 1.2*  --  2.1 1.9  GFRNONAA 18* 22* 27* 33*  ANIONGAP 12 14 11 9     Recent Labs  Lab 06/21/22 1601  PROT 6.8  ALBUMIN 3.6  AST 46*  ALT 12  ALKPHOS 134*  BILITOT 1.3*   Lipids No results for input(s): "CHOL", "TRIG", "HDL", "LABVLDL", "LDLCALC", "CHOLHDL" in the last 168 hours.  Hematology Recent Labs  Lab 06/23/22 0549 06/24/22 0421 06/25/22 0622  WBC 22.2* 11.6* 8.7  RBC 3.15* 2.81* 3.06*  HGB 9.7* 8.9* 9.5*  HCT 29.4* 25.9* 28.7*  MCV 93.3 92.2 93.8  MCH 30.8 31.7 31.0  MCHC 33.0 34.4 33.1  RDW 14.9 14.4 14.5  PLT 33* 40* 72*   Thyroid  Recent Labs  Lab 06/21/22 2301  TSH 1.571  FREET4 0.96    BNPNo results for input(s): "BNP", "PROBNP" in the last 168 hours.  DDimer  Recent Labs  Lab 06/21/22 2301  DDIMER 8.22*     Radiology/Studies:  DG C-Arm 1-60 Min-No Report  Result Date: 06/25/2022 Fluoroscopy was utilized by the requesting physician.  No radiographic interpretation.   US Abdomen Limited RUQ (LIVER/GB)  Result Date: 06/24/2022 CLINICAL DATA:  Abdominal pain EXAM: ULTRASOUND ABDOMEN LIMITED RIGHT UPPER QUADRANT COMPARISON:  None Available. FINDINGS: Gallbladder: Dilated gallbladder. Borderline wall thickening. No shadowing stones. There is some sludge. There is some adjacent fluid as well to the gallbladder. Common bile duct: Diameter: 6 mm Liver: Nodular contours of the liver with mild ascites. Right-sided pleural effusion. Portal vein is patent on  color Doppler imaging with normal direction of blood flow towards the liver. Other: None. IMPRESSION: Distended gallbladder with sludge.  No stones. There is some wall thickening and adjacent fluid although there is a nodular appearance of the liver with the ascites. Please correlate for chronic liver disease. Overall if there is further concern of acalculous cholecystitis with the dilatation and wall thickening, HIDA scan may be useful as the next step in the workup Electronically Signed   By: Karen Kays M.D.   On: 06/24/2022 12:05   CT RENAL STONE STUDY  Result Date: 06/23/2022 CLINICAL DATA:  Abdominal/flank pain, stone suspected EXAM: CT ABDOMEN AND PELVIS WITHOUT CONTRAST TECHNIQUE: Multidetector CT imaging of the abdomen and pelvis was performed following the standard protocol without IV contrast. RADIATION DOSE REDUCTION: This exam was performed according to the departmental dose-optimization program which includes automated exposure control, adjustment of the mA and/or kV according to patient size and/or use of iterative reconstruction technique. COMPARISON:  Contrast enhanced CT 04/13/2021 FINDINGS: Lower chest: Small to moderate bilateral pleural effusions. Associated bibasilar airspace disease. Additional airspace disease in the lingula and right middle lobe is chronic when compared with prior abdominal CT. There are coronary artery calcifications. Mitral annulus calcifications. The heart is enlarged. Hepatobiliary: Motion artifact through the liver, allowing for this, no evidence of focal liver lesion. The gallbladder is distended. Assessment for pericholecystic inflammation is limited due to motion through this region. The tiny gallstones on prior exam are not definitively seen. The common bile duct and biliary tree are difficult to define. There is no obvious biliary dilatation. Pancreas: Parenchymal atrophy. Motion artifact through the proximal pancreas. There is no pancreatic inflammation.  Spleen: Normal in size without focal abnormality. Adrenals/Urinary Tract: No adrenal nodule. There is a 2 cm stone in the left renal pelvis extending into the proximal ureter with moderate hydronephrosis. 14 mm no obstructing stone in the lower pole of the left kidney and 3 mm nonobstructing stone in the upper pole of the left kidney. No significant left perinephric edema. Right renal cortical atrophy. Calcification in the posterior mid upper kidney likely represents a combination of parenchymal calcification a nonobstructing stone, linear 11 mm calcification may represent a stone fragment in the renal collecting system. Previous proximal right ureteral stone is no longer seen. There is mild right perirenal scarring. No definite ureteral calculi. Nondistended urinary bladder. No bladder stone. Stomach/Bowel: No bowel obstruction or inflammation. The appendix not confidently visualized, no appendicitis. Small to moderate volume of stool throughout the colon. Occasional colonic diverticula, no diverticulitis. Vascular/Lymphatic: Moderate aortic atherosclerosis. Aortic tortuosity but no aneurysm. Assessment for adenopathy is limited in the absence of contrast and patient motion. No obvious bulky enlarged lymph nodes. Reproductive: Hysterectomy. Small amount of air in the vagina is nonspecific. No adnexal mass. Other: Small volume of free fluid in the pelvis. Trace free fluid in the right upper quadrant. There is mild body wall subcutaneous edema. No free intra-abdominal air. Musculoskeletal: Interbody spacer at L4-L5. Mild compression deformities versus Schmorl's nodes within superior endplate of T11, L1 and L3, chronic. The bones are subjectively under mineralized. Left proximal femur surgical hardware is partially included. No acute osseous findings. IMPRESSION: 1. Obstructing 2 cm stone  in the left renal pelvis extending into the proximal ureter with moderate hydronephrosis. Additional nonobstructing stones in the  left kidney. 2. Mild right renal scarring. Calcifications in the right kidney may be combination of parenchymal calcifications and stone fragments. 3. Hydropic gallbladder. Assessment for pericholecystic inflammation is limited due to motion through this region. The tiny gallstones on prior exam are not definitively seen. If there is clinical concern for acute gallbladder pathology, recommend right upper quadrant ultrasound. 4. Small to moderate bilateral pleural effusions with adjacent airspace disease, likely atelectasis. Additional airspace disease in the lingula and right middle lobe is chronic when compared with prior abdominal CT. 5. Small volume of free fluid in the pelvis and right upper quadrant, nonspecific. Mild body wall edema. Aortic Atherosclerosis (ICD10-I70.0). Electronically Signed   By: Narda Rutherford M.D.   On: 06/23/2022 18:18   DG Chest Port 1 View  Result Date: 06/21/2022 CLINICAL DATA:  Sepsis EXAM: PORTABLE CHEST 1 VIEW COMPARISON:  06/10/2018 FINDINGS: 2 frontal views of the chest demonstrate an enlarged cardiac silhouette. There is patchy bibasilar consolidation, which could reflect atelectasis or airspace disease. Lung volumes are diminished. No effusion or pneumothorax. No acute bony abnormality. IMPRESSION: 1. Low lung volumes, with patchy bibasilar consolidation consistent with atelectasis or airspace disease. 2. Enlarged cardiac silhouette. Electronically Signed   By: Sharlet Salina M.D.   On: 06/21/2022 16:58     Assessment and Plan:   New onset Afib - noted on admission 5/4 in the setting of severe sepsis 2/2 UTI/PNA - started on IV amio with conversion to NSR, IV amio stopped 5/5 - recurrent afib RVR post-procedure 5/7 given IV metoprolol with conversion to NSR - she appears comfortable on exam - CHADSVASC (female, age, HTN, aortic atherosclerosis). Does not appear to be a good candidate for anticoagulation given recurrent falls and chronic anemia - restart oral  amiodarone 400mg  BID x 7 days, 200mg  BID x 7 days, 200mg  daily thereafter  - echo ordered - Keep Mag>2 and K>4 - TSH normal - unsure BP will tolerate rate control medication   For questions or updates, please contact [facility] Please consult [email or web address] for contact info under    Signed, Cambrea Kirt David Stall, PA-C  06/25/2022 11:24 AM

## 2022-06-25 NOTE — Anesthesia Preprocedure Evaluation (Addendum)
Anesthesia Evaluation  Patient identified by MRN, date of birth, ID band Patient awake    Reviewed: Allergy & Precautions, NPO status , Patient's Chart, lab work & pertinent test results  History of Anesthesia Complications (+) PONV and history of anesthetic complications  Airway Mallampati: III  TM Distance: >3 FB Neck ROM: full    Dental  (+) Edentulous Lower, Edentulous Upper   Pulmonary former smoker   Pulmonary exam normal        Cardiovascular Exercise Tolerance: Poor hypertension, Normal cardiovascular exam     Neuro/Psych  PSYCHIATRIC DISORDERS  Depression   Dementia negative neurological ROS     GI/Hepatic Neg liver ROS,GERD  Medicated,,  Endo/Other  negative endocrine ROS    Renal/GU Renal disease     Musculoskeletal  (+) Arthritis ,  Hx of Viviann Spare Johnson's syndrome with sulfa antibiotics    Abdominal   Peds  Hematology  (+) Blood dyscrasia, anemia   Anesthesia Other Findings Past Medical History: No date: Anemia No date: Arthritis No date: Chronic pain No date: Complication of anesthesia No date: Dyspnea No date: GERD (gastroesophageal reflux disease) No date: History of kidney stones No date: Hypertension No date: Osteoporosis No date: Pneumonia No date: PONV (postoperative nausea and vomiting) No date: Stevens-Johnson syndrome (HCC)  Past Surgical History: No date: ABDOMINAL HYSTERECTOMY     Comment:  partial No date: BACK SURGERY 04/13/2020: CYSTOSCOPY WITH STENT PLACEMENT; Right     Comment:  Procedure: CYSTOSCOPY WITH STENT PLACEMENT;  Surgeon:               Sondra Come, MD;  Location: [facility] ORS;  Service:               Urology;  Laterality: Right; 04/27/2020: CYSTOSCOPY/URETEROSCOPY/HOLMIUM LASER/STENT PLACEMENT;  Right     Comment:  Procedure: CYSTOSCOPY/URETEROSCOPY/HOLMIUM LASER/STENT               EXCHANGE;  Surgeon: Sondra Come, MD;  Location: [facility]              ORS;   Service: Urology;  Laterality: Right; 06/30/2019: ESOPHAGOGASTRODUODENOSCOPY; N/A     Comment:  Procedure: ESOPHAGOGASTRODUODENOSCOPY (EGD);  Surgeon:               Toledo, Boykin Nearing, MD;  Location: [facility] ENDOSCOPY;                Service: Gastroenterology;  Laterality: N/A; No date: FOOT SURGERY No date: FRACTURE SURGERY     Comment:  left hip repair No date: HAND SURGERY     Comment:  THUMB REPAIR No date: HEMORRHOID SURGERY 08/06/2017: INTRAMEDULLARY (IM) NAIL INTERTROCHANTERIC; Left     Comment:  Procedure: INTRAMEDULLARY (IM) NAIL INTERTROCHANTRIC;                Surgeon: Signa Kell, MD;  Location: [facility] ORS;  Service:              Orthopedics;  Laterality: Left; No date: POSTERIOR REPAIR  BMI    Body Mass Index: 18.66 kg/m      Reproductive/Obstetrics negative OB ROS                             Anesthesia Physical Anesthesia Plan  ASA: 3  Anesthesia Plan: General LMA   Post-op Pain Management: Ofirmev IV (intra-op)*   Induction: Intravenous  PONV Risk Score and Plan: Dexamethasone, Ondansetron, Midazolam and Treatment may vary due to age or  medical condition  Airway Management Planned: LMA  Additional Equipment:   Intra-op Plan:   Post-operative Plan: Extubation in OR  Informed Consent: I have reviewed the patients History and Physical, chart, labs and discussed the procedure including the risks, benefits and alternatives for the proposed anesthesia with the patient or authorized representative who has indicated his/her understanding and acceptance.    Discussed DNR with power of attorney and Continue DNR.   Dental Advisory Given and Consent reviewed with POA  Plan Discussed with: Anesthesiologist, CRNA and Surgeon  Anesthesia Plan Comments: (Patient consented for risks of anesthesia including but not limited to:  - adverse reactions to medications - damage to eyes, teeth, lips or other oral mucosa - nerve damage due to positioning   - sore throat or hoarseness - Damage to heart, brain, nerves, lungs, other parts of body or loss of life  Patient voiced understanding.)        Anesthesia Quick Evaluation

## 2022-06-25 NOTE — TOC Progression Note (Signed)
Transition of Care [facility]) - Progression Note    Patient Details  Name: Alison Price MRN: [id number] Date of Birth: [birth year]  Transition of Care [facility]) CM/SW Contact  Allena Katz, LCSW Phone Number: 06/25/2022, 3:13 PM  Clinical Narrative:   CSW spoke with sister to speak about rehab. Sister reports pt has been to [facility] and [facility]. She reports she does not want patient going to [facility] but is interested in a referral to [facility] and rehab. Referral sent. Message sent to [facility] to review.          Expected Discharge Plan and Services                                               Social Determinants of Health (SDOH) Interventions SDOH Screenings   Tobacco Use: Medium Risk (06/25/2022)    Readmission Risk Interventions     No data to display

## 2022-06-25 NOTE — Transfer of Care (Signed)
Immediate Anesthesia Transfer of Care Note  Patient: [facility]  Procedure(s) Performed: CYSTOSCOPY WITH URETEROSCOPY AND STENT PLACEMENT (Left: Ureter)  Patient Location: PACU  Anesthesia Type:General  Level of Consciousness: awake and alert   Airway & Oxygen Therapy: Patient Spontanous Breathing and Patient connected to nasal cannula oxygen  Post-op Assessment: Report given to RN and Post -op Vital signs reviewed and stable  Post vital signs: stable  Last Vitals:  Vitals Value Taken Time  BP 91/75 06/25/22 0815  Temp    Pulse 73 06/25/22 0815  Resp 19 06/25/22 0815  SpO2 98 % 06/25/22 0815  Vitals shown include unvalidated device data.  Last Pain:  Vitals:   06/25/22 0131  TempSrc: Oral         Complications: No notable events documented.

## 2022-06-25 NOTE — Progress Notes (Signed)
RN gave Aliene Altes handoff to OR RN. RN was notified that the OR would send for the patient around 0615.

## 2022-06-25 NOTE — Interval H&P Note (Signed)
History and Physical Interval Note:  CV:rrr Lungs:clear  06/25/2022 6:55 AM  [facility]  has presented today for surgery, with the diagnosis of left ureteral calculus.  The various methods of treatment have been discussed with the patient and family. After consideration of risks, benefits and other options for treatment, the patient has consented to  Procedure(s): CYSTOSCOPY WITH URETEROSCOPY AND STENT PLACEMENT (Left) as a surgical intervention.  The patient's history has been reviewed, patient examined, no change in status, stable for surgery.  I have reviewed the patient's chart and labs.  Questions were answered to the patient's satisfaction.     Alison Price C Elijan Googe

## 2022-06-25 NOTE — NC FL2 (Signed)
[place] MEDICAID FL2 LEVEL OF CARE FORM     IDENTIFICATION  Patient Name: Alison Price Birthdate: 1945-05-10 Sex: female Admission Date (Current Location): 06/21/2022  [facility] and [place] Number:  Chiropodist and Address:  [facility], [address], [place], [place] [address]      Provider Number: [id number]  Attending Physician Name and Address:  Willeen Niece, MD  Relative Name and Phone Number:  Sundus, [place] (Sister) [phone]    Current Level of Care: Hospital Recommended Level of Care: Skilled Nursing Facility Prior Approval Number:    Date Approved/Denied:   PASRR Number: [id number]  Discharge Plan: SNF    Current Diagnoses: Patient Active Problem List   Diagnosis Date Noted   Hydronephrosis with urinary obstruction due to renal calculus 06/24/2022   Nephrolithiasis 06/24/2022   AKI (acute kidney injury) (HCC) 06/24/2022   Lower urinary tract infectious disease 06/24/2022   Severe sepsis with lactic acidosis (HCC) 06/21/2022   Wrist fracture 11/03/2020   Right patella fracture 11/01/2020   Left ulnar fracture 11/01/2020   Left radial fracture 11/01/2020   Fall 11/01/2020   Sepsis (HCC)    Acute pyelonephritis 04/13/2020   Obstructive uropathy 04/13/2020   Normocytic anemia 04/13/2020   Depression    Chronic pain    Protein-calorie malnutrition, severe 07/01/2019   Other chronic pain    Urinary tract infection due to extended-spectrum beta lactamase (ESBL) producing Escherichia coli    Weakness    Constipation    Abdominal pain 06/29/2019   Hypomagnesemia    Gastroesophageal reflux disease without esophagitis    Hip fx (HCC) 08/05/2017   Nausea and vomiting 10/23/2016   Hypokalemia 10/23/2016   Dehydration 10/23/2016   Essential hypertension 10/23/2016   History of total abdominal hysterectomy 06/26/2015   Pelvic pressure in female 06/26/2015   Vaginal atrophy 06/26/2015   Hematuria 06/26/2015    Dysuria 06/26/2015   Pancytopenia (HCC) 04/13/2015    Orientation RESPIRATION BLADDER Height & Weight     Self  O2 Incontinent (stent) Weight: 130 lb 1.1 oz (59 kg) Height:  5\' 10"  (177.8 cm)  BEHAVIORAL SYMPTOMS/MOOD NEUROLOGICAL BOWEL NUTRITION STATUS      Incontinent Diet (DYS 3)  AMBULATORY STATUS COMMUNICATION OF NEEDS Skin   Extensive Assist Verbally Normal                       Personal Care Assistance Level of Assistance  Bathing, Dressing, Feeding Bathing Assistance: Maximum assistance Feeding assistance: Maximum assistance Dressing Assistance: Maximum assistance     Functional Limitations Info  Sight, Hearing, Speech Sight Info: Impaired Hearing Info: Impaired Speech Info: Impaired    SPECIAL CARE FACTORS FREQUENCY  PT (By licensed PT), OT (By licensed OT)     PT Frequency: 5 Times a wewek OT Frequency: 5 times a week            Contractures Contractures Info: Not present    Additional Factors Info  Code Status, Allergies, Isolation Precautions Code Status Info: DNR Allergies Info: Codeine  Sulfa Antibiotics     Isolation Precautions Info: ESBL, MRSA     Current Medications (06/25/2022):  This is the current hospital active medication list Current Facility-Administered Medications  Medication Dose Route Frequency Provider Last Rate Last Admin   0.9 %  sodium chloride infusion  250 mL Intravenous Continuous Jimmye Norman, NP 10 mL/hr at 06/22/22 0900 Infusion Verify at 06/22/22 0900   acetaminophen (TYLENOL) tablet 650 mg  650 mg Oral Q6H PRN Jaynie Bream, RPH       amiodarone (PACERONE) tablet 400 mg  400 mg Oral BID Antonieta Iba, MD   400 mg at 06/25/22 1407   cefTRIAXone (ROCEPHIN) 2 g in sodium chloride 0.9 % 100 mL IVPB  2 g Intravenous Q24H Jimmye Norman, NP 200 mL/hr at 06/25/22 0944 2 g at 06/25/22 0944   Chlorhexidine Gluconate Cloth 2 % PADS 6 each  6 each Topical Q0600 Erin Fulling, MD   6 each at 06/25/22  0518   cholecalciferol (VITAMIN D3) 25 MCG (1000 UNIT) tablet 2,000 Units  2,000 Units Oral Daily Enedina Finner, MD   2,000 Units at 06/25/22 0933   donepezil (ARICEPT) tablet 10 mg  10 mg Oral QHS Jaynie Bream, RPH   10 mg at 06/24/22 2111   escitalopram (LEXAPRO) tablet 5 mg  5 mg Oral Daily Jaynie Bream, RPH   5 mg at 06/25/22 9604   feeding supplement (ENSURE ENLIVE / ENSURE PLUS) liquid 237 mL  237 mL Oral BID BM Idelle Leech, Pardeep, MD   237 mL at 06/25/22 1407   ferrous sulfate tablet 324 mg  324 mg Oral Q breakfast Enedina Finner, MD   324 mg at 06/25/22 0933   heparin injection 5,000 Units  5,000 Units Subcutaneous Q8H Jimmye Norman, NP   5,000 Units at 06/25/22 1407   lactated ringers infusion   Intravenous Continuous Enedina Finner, MD 75 mL/hr at 06/25/22 1021 New Bag at 06/25/22 1021   melatonin tablet 10 mg  10 mg Oral QHS Jaynie Bream, RPH   10 mg at 06/24/22 2111   multivitamin with minerals tablet 1 tablet  1 tablet Oral Daily Enedina Finner, MD   1 tablet at 06/25/22 0934   mupirocin ointment (BACTROBAN) 2 % 1 Application  1 Application Nasal BID Erin Fulling, MD   1 Application at 06/25/22 1005   ondansetron (ZOFRAN) tablet 4 mg  4 mg Oral Q8H PRN Jaynie Bream, RPH       oxyCODONE (Oxy IR/ROXICODONE) immediate release tablet 10 mg  10 mg Oral Q8H PRN Jaynie Bream, RPH       polyethylene glycol (MIRALAX / GLYCOLAX) packet 17 g  17 g Oral Daily PRN Jimmye Norman, NP       senna-docusate (Senokot-S) tablet 1 tablet  1 tablet Oral QHS PRN Jaynie Bream, RPH       senna-docusate (Senokot-S) tablet 1 tablet  1 tablet Oral Daily Enedina Finner, MD   1 tablet at 06/25/22 5409     Discharge Medications: Please see discharge summary for a list of discharge medications.  Relevant Imaging Results:  Relevant Lab Results:   Additional Information SS- [id number]  Allena Katz, LCSW

## 2022-06-25 NOTE — Progress Notes (Signed)
Physical Therapy Treatment Patient Details Name: Alison Price MRN: [id number] DOB: 1945-06-21 Today's Date: 06/25/2022   History of Present Illness Patient is a 77 yo female that presented to ED for AMS, workup for sepsis, acute renal insufficiency and afib. PMH of anemia, GERD, HTN, stevens-johnson syndrome.  S/P CYSTOSCOPY WITH URETEROSCOPY AND STENT PLACEMENT 5/8.    PT Comments    Pt pleasant but confused and needed consistent reminders not to pull on IV, bandages, etc (nursing notified).  She struggled to initiate any verbally cued activities, did show some effort after direct tactile cuing/assist though still this was limited.  She was able to get to standing at EOB with PT assist and managed to take a few heavily cued/assisted side steps along EOB but her cognition did not make ambulation away from the bed safe or practical.  Pt continued to be far from her baseline, will benefit from continued PT per POC.    Recommendations for follow up therapy are one component of a multi-disciplinary discharge planning process, led by the attending physician.  Recommendations may be updated based on patient status, additional functional criteria and insurance authorization.  Follow Up Recommendations  Can patient physically be transported by private vehicle: No    Assistance Recommended at Discharge Frequent or constant Supervision/Assistance  Patient can return home with the following Two people to help with walking and/or transfers;Assistance with cooking/housework;Direct supervision/assist for medications management;Assist for transportation;Help with stairs or ramp for entrance;Assistance with feeding;Two people to help with bathing/dressing/bathroom   Equipment Recommendations   (TBD)    Recommendations for Other Services       Precautions / Restrictions Precautions Precautions: Fall Restrictions Weight Bearing Restrictions: No     Mobility  Bed Mobility Overal bed mobility:  Needs Assistance Bed Mobility: Sidelying to Sit, Supine to Sit     Supine to sit: Mod assist Sit to supine: Max assist   General bed mobility comments: Despite much cuing and encouragement pt could not initiate movement toward EOB, did show some minimal effort once PT started assisting but this was functionally limited (due to mental status)    Transfers Overall transfer level: Needs assistance Equipment used: Rolling walker (2 wheels) Transfers: Sit to/from Stand Sit to Stand: Mod assist           General transfer comment: Pt again struggled to initiate movement despite much cuing and encouragement, showed some effort once tactile cuing was added to verbal cues - consistent assist to insure fully attaining upright though she did give some effort.    Ambulation/Gait               General Gait Details: unable to ambulate; managed to take a few very small, unsteady, labored side steps with excessive cuing   Stairs             Wheelchair Mobility    Modified Rankin (Stroke Patients Only)       Balance Overall balance assessment: Needs assistance Sitting-balance support: Feet supported Sitting balance-Leahy Scale: Fair Sitting balance - Comments: initially losing balance backward, unable to self correct.  Once assisted fully to EOB she maintained static sitting with hunched posture occasionally leaning fully over onto elbow   Standing balance support: Reliant on assistive device for balance Standing balance-Leahy Scale: Poor Standing balance comment: Once standing pt was able to maintain static standing but due to poor awareness and ability to follow basic cuing needed consistent heavy assist to maintain stafety  Cognition Arousal/Alertness: Awake/alert Behavior During Therapy: Restless Overall Cognitive Status: No family/caregiver present to determine baseline cognitive functioning                                  General Comments: Pt oriented to self only, struggled to meaningfully engage in conversation or follow even simple instruction        Exercises      General Comments General comments (skin integrity, edema, etc.): Pt struggled to initiate much movement despite heavy cuing and encouargement, limited funcional mobility on display today.  Pt self selected taking O2 off despite PT encouragement to remain on - SpO2 dropped to mid 80s with light activity and PT reapplied (slowly back up to the 90s on 2L)      Pertinent Vitals/Pain Pain Assessment Pain Assessment:  (unable to answer, does not indicate pain)    Home Living                          Prior Function            PT Goals (current goals can now be found in the care plan section) Progress towards PT goals: Progressing toward goals    Frequency    Min 2X/week      PT Plan Current plan remains appropriate    Co-evaluation              AM-PAC PT "6 Clicks" Mobility   Outcome Measure  Help needed turning from your back to your side while in a flat bed without using bedrails?: A Lot Help needed moving from lying on your back to sitting on the side of a flat bed without using bedrails?: A Lot Help needed moving to and from a bed to a chair (including a wheelchair)?: A Lot Help needed standing up from a chair using your arms (e.g., wheelchair or bedside chair)?: A Lot Help needed to walk in hospital room?: A Lot Help needed climbing 3-5 steps with a railing? : Total 6 Click Score: 11    End of Session Equipment Utilized During Treatment: Gait belt;Oxygen Activity Tolerance:  (limited by cognition) Patient left: in bed;with call bell/phone within reach;with bed alarm set Nurse Communication: Mobility status (BM clean up) PT Visit Diagnosis: Other abnormalities of gait and mobility (R26.89);Muscle weakness (generalized) (M62.81)     Time: 5621-3086 PT Time Calculation (min) (ACUTE ONLY): 11  min  Charges:  $Therapeutic Activity: 8-22 mins                     Malachi Pro, DPT 06/25/2022, 1:19 PM

## 2022-06-25 NOTE — Progress Notes (Signed)
Patients necklace and earrings returned to patient

## 2022-06-25 NOTE — Progress Notes (Signed)
PROGRESS NOTE    Alison Price  ZOX:[id number] DOB: 1945/07/14 DOA: 06/21/2022 PCP: Jaclyn Shaggy, MD   Brief Narrative:  This 77 yrs old  female with PMH significant of dementia, hypertension, GERD, arthritis, Stevens-Johnson's syndrome, recurrent falls with recent right patella and left wrist fracture who presented to the ED with chief complaints of altered mental status.  She was found to have severe sepsis secondary to UTI.  She was started on IV antibiotics.  CT renal study shows obstructing stone causing hydronephrosis.  Urology is consulted,  Patient underwent cystoscopy with left ureteral stent placement.  Post procedure patient developed A-fib with RVR and was given a dose of metoprolol and she converted to normal sinus rhythm.  Cardiology is consulted.   Assessment & Plan:   Principal Problem:   Severe sepsis with lactic acidosis (HCC) Active Problems:   Hydronephrosis with urinary obstruction due to renal calculus   Nephrolithiasis   AKI (acute kidney injury) (HCC)   Lower urinary tract infectious disease  Severe sepsis /shock due to E. coli UTI: E. coli bacteremia / pneumonia: H/o Ureteral stone in 2022 (had cystoscopy with stent placement and h/o Lithotripsy): Patient presented with septic shock requiring IV fluids / vasopressors. Sepsis physiology improving. Blood culture positive for E. Coli. Continue IV Rocephin , change to oral at discharge. She remains afebrile.  Lactic acid normalized with IV hydration. Procalcitonin trended down 150 > 33.99. Patient currently on room air. No cough  Leukocytosis: Presented with WBC 4.8 trended up to 19.8--22K. Likely secondary to UTI, continue IV ceftriaxone.   Paroxysmal  New onset A. fib in the setting of sepsis >  Now resolved She was started on IV amiodarone drip. Heart rate  now ranges between 57-70 EKG showed normal sinus rhythm. Post procedure patient went into A-fib again with RVR , given a dose of metoprolol   She converted to normal sinus rhythm. She is not a candidate for anticoagulation given recurrent falls.   Acute on chronic kidney disease stage IV: Baseline serum creatinine( 0.9--1.22 in sept 2022).  Could not find any other labs prior to this. Continue IV fluid resuscitation. Avoid nephrotoxic medications. Creatinine is improving 3.07 > 2.70 > 2.68 > 2.25 > 1.93>1.62  Left hydronephrosis : CT renal study shows obstructing stone causing hydronephrosis. Urology is consulted.  Patient underwent cystoscopy with left ureteral stent placement. Continue antibiotics and urology follow-up.   Hypomagnesimia Replaced.  Resolved.   GERD: Pantoprazole 40 mg daily.   Generalized weakness with deconditioning: PT and OT evaluation   Dementia without behavioral problems: Sister [name] who is next of kin.  She reports patient has dementia. Patient lives with [name] at home. She is fairly independent. TOC for discharge planning    DVT prophylaxis: Heparin Code Status: DNR Family Communication: No family at bedside Disposition Plan:  Status is: Inpatient Remains inpatient appropriate because: Admitted for severe sepsis secondary to E. coli bacteremia, UTI.  CT renal study shows kidney stone causing hydronephrosis.  Urology is consulted.  Continued on IV antibiotics.  Underwent cystoscopy with left ureteral stent placement.  Consultants:  Urology  Procedures: CT renal study.  Antimicrobials:  Anti-infectives (From admission, onward)    Start     Dose/Rate Route Frequency Ordered Stop   06/22/22 1000  azithromycin (ZITHROMAX) 500 mg in sodium chloride 0.9 % 250 mL IVPB  Status:  Discontinued        500 mg 250 mL/hr over 60 Minutes Intravenous Every 24 hours 06/21/22 2251 06/22/22  1610   06/22/22 0800  cefTRIAXone (ROCEPHIN) 2 g in sodium chloride 0.9 % 100 mL IVPB        2 g 200 mL/hr over 30 Minutes Intravenous Every 24 hours 06/21/22 2251     06/21/22 1700  ceFEPIme (MAXIPIME) 2 g  in sodium chloride 0.9 % 100 mL IVPB        2 g 200 mL/hr over 30 Minutes Intravenous  Once 06/21/22 1652 06/21/22 1713   06/21/22 1700  vancomycin (VANCOCIN) IVPB 1000 mg/200 mL premix        1,000 mg 200 mL/hr over 60 Minutes Intravenous  Once 06/21/22 1652 06/21/22 1814       Subjective: Patient was seen and examined at bedside.  Overnight events noted.   Patient has baseline dementia. She follows commands.  She is oriented to self only. She underwent cystoscopy with left ureteral stent placement.   Postprocedure she went into A-fib with RVR and was given a dose of metoprolol. She is now converted to normal sinus rhythm.   Objective: Vitals:   06/25/22 0845 06/25/22 0846 06/25/22 0849 06/25/22 0850  BP: 106/80 99/87  125/82  Pulse: (!) 132 (!) 130 75 66  Resp: 16 13 17 14   Temp:    (!) 97 F (36.1 C)  TempSrc:      SpO2: 100% 100% 97% 99%  Weight:      Height:        Intake/Output Summary (Last 24 hours) at 06/25/2022 1348 Last data filed at 06/25/2022 0746 Gross per 24 hour  Intake 1573.09 ml  Output --  Net 1573.09 ml   Filed Weights   06/21/22 1610 06/22/22 0700 06/24/22 0500  Weight: 58.4 kg 58.4 kg 59 kg    Examination:  General exam: Appears calm and comfortable, deconditioned, not in any acute distress. Respiratory system: CTA bilaterally, respiratory effort normal.  RR 16. Cardiovascular system: S1 & S2 heard, RRR. No JVD, murmurs, rubs, gallops or clicks.  Gastrointestinal system: Abdomen is soft, non tender, non distended, BS+ Central nervous system: Alert and oriented x 1. No focal neurological deficits. Extremities: No edema, no cyanosis, no clubbing. Skin: No rashes, lesions or ulcers Psychiatry: Judgement and insight appear normal. Mood & affect appropriate.     Data Reviewed: I have personally reviewed following labs and imaging studies  CBC: Recent Labs  Lab 06/21/22 1601 06/22/22 0502 06/23/22 0549 06/24/22 0421 06/25/22 0622  WBC 4.9  19.8* 22.2* 11.6* 8.7  NEUTROABS 3.5  --   --   --   --   HGB 12.1 9.3* 9.7* 8.9* 9.5*  HCT 35.7* 27.0* 29.4* 25.9* 28.7*  MCV 93.2 91.8 93.3 92.2 93.8  PLT 43* PLATELET CLUMPS NOTED ON SMEAR, UNABLE TO ESTIMATE 33* 40* 72*   Basic Metabolic Panel: Recent Labs  Lab 06/21/22 2301 06/22/22 0502 06/23/22 0835 06/24/22 0421 06/25/22 0622  NA 137 136 139 139 141  K 3.6 4.1 4.1 3.5 3.5  CL 104 104 106 107 109  CO2 20* 20* 19* 21* 23  GLUCOSE 97 93 81 92 94  BUN 64* 64* 69* 68* 63*  CREATININE 2.70* 2.68* 2.25* 1.93* 1.62*  CALCIUM 8.1* 8.0* 8.0* 8.2* 8.2*  MG  --  1.2*  --  2.1 1.9  PHOS  --  3.9  --   --  3.8   GFR: Estimated Creatinine Clearance: 27.5 mL/min (A) (by C-G formula based on SCr of 1.62 mg/dL (H)). Liver Function Tests: Recent Labs  Lab 06/21/22  1601  AST 46*  ALT 12  ALKPHOS 134*  BILITOT 1.3*  PROT 6.8  ALBUMIN 3.6   No results for input(s): "LIPASE", "AMYLASE" in the last 168 hours. No results for input(s): "AMMONIA" in the last 168 hours. Coagulation Profile: No results for input(s): "INR", "PROTIME" in the last 168 hours. Cardiac Enzymes: No results for input(s): "CKTOTAL", "CKMB", "CKMBINDEX", "TROPONINI" in the last 168 hours. BNP (last 3 results) No results for input(s): "PROBNP" in the last 8760 hours. HbA1C: No results for input(s): "HGBA1C" in the last 72 hours. CBG: Recent Labs  Lab 06/21/22 2243  GLUCAP 92   Lipid Profile: No results for input(s): "CHOL", "HDL", "LDLCALC", "TRIG", "CHOLHDL", "LDLDIRECT" in the last 72 hours. Thyroid Function Tests: No results for input(s): "TSH", "T4TOTAL", "FREET4", "T3FREE", "THYROIDAB" in the last 72 hours.  Anemia Panel: No results for input(s): "VITAMINB12", "FOLATE", "FERRITIN", "TIBC", "IRON", "RETICCTPCT" in the last 72 hours. Sepsis Labs: Recent Labs  Lab 06/21/22 1601 06/21/22 1947 06/21/22 2358 06/25/22 0622  PROCALCITON >150.00  --   --  33.99  LATICACIDVEN 4.4* 5.5* 2.7* 1.2     Recent Results (from the past 240 hour(s))  Blood Culture (routine x 2)     Status: Abnormal   Collection Time: 06/21/22  4:00 PM   Specimen: BLOOD RIGHT FOREARM  Result Value Ref Range Status   Specimen Description   Final    BLOOD RIGHT FOREARM Performed at [facility], [address], [place], [place] [address]    Special Requests   Final    BOTTLES DRAWN AEROBIC AND ANAEROBIC Blood Culture adequate volume Performed at [facility], [address] [place], [place] [address]    Culture  Setup Time   Final    IN BOTH AEROBIC AND ANAEROBIC BOTTLES GRAM NEGATIVE RODS CRITICAL RESULT CALLED TO, READ BACK BY AND VERIFIED WITHDawayne Cirri @ 0981 06/22/22 LFD Performed at [facility], [address] [place], [place] [address]    Culture ESCHERICHIA COLI (A)  Final   Report Status 06/24/2022 FINAL  Final   Organism ID, Bacteria ESCHERICHIA COLI  Final      Susceptibility   Escherichia coli - MIC*    AMPICILLIN 8 SENSITIVE Sensitive     CEFEPIME <=0.12 SENSITIVE Sensitive     CEFTAZIDIME <=1 SENSITIVE Sensitive     CEFTRIAXONE <=0.25 SENSITIVE Sensitive     CIPROFLOXACIN <=0.25 SENSITIVE Sensitive     GENTAMICIN <=1 SENSITIVE Sensitive     IMIPENEM <=0.25 SENSITIVE Sensitive     TRIMETH/SULFA <=20 SENSITIVE Sensitive     AMPICILLIN/SULBACTAM 4 SENSITIVE Sensitive     PIP/TAZO <=4 SENSITIVE Sensitive     * ESCHERICHIA COLI  Blood Culture ID Panel (Reflexed)     Status: Abnormal   Collection Time: 06/21/22  4:00 PM  Result Value Ref Range Status   Enterococcus faecalis NOT DETECTED NOT DETECTED Final   Enterococcus Faecium NOT DETECTED NOT DETECTED Final   Listeria monocytogenes NOT DETECTED NOT DETECTED Final   Staphylococcus species NOT DETECTED NOT DETECTED Final   Staphylococcus aureus (BCID) NOT DETECTED NOT DETECTED Final   Staphylococcus epidermidis NOT DETECTED NOT DETECTED Final   Staphylococcus lugdunensis NOT DETECTED NOT DETECTED Final    Streptococcus species NOT DETECTED NOT DETECTED Final   Streptococcus agalactiae NOT DETECTED NOT DETECTED Final   Streptococcus pneumoniae NOT DETECTED NOT DETECTED Final   Streptococcus pyogenes NOT DETECTED NOT DETECTED Final   A.calcoaceticus-baumannii NOT DETECTED NOT DETECTED Final  Bacteroides fragilis NOT DETECTED NOT DETECTED Final   Enterobacterales DETECTED (A) NOT DETECTED Final    Comment: Enterobacterales represent a large order of gram negative bacteria, not a single organism. CRITICAL RESULT CALLED TO, READ BACK BY AND VERIFIED WITH: NATHAN BELUE @ 0519 06/22/22 LFD    Enterobacter cloacae complex NOT DETECTED NOT DETECTED Final   Escherichia coli DETECTED (A) NOT DETECTED Final    Comment: CRITICAL RESULT CALLED TO, READ BACK BY AND VERIFIED WITH: NATHAN BELUE @ 0519 06/22/22 LFD    Klebsiella aerogenes NOT DETECTED NOT DETECTED Final   Klebsiella oxytoca NOT DETECTED NOT DETECTED Final   Klebsiella pneumoniae NOT DETECTED NOT DETECTED Final   Proteus species NOT DETECTED NOT DETECTED Final   Salmonella species NOT DETECTED NOT DETECTED Final   Serratia marcescens NOT DETECTED NOT DETECTED Final   Haemophilus influenzae NOT DETECTED NOT DETECTED Final   Neisseria meningitidis NOT DETECTED NOT DETECTED Final   Pseudomonas aeruginosa NOT DETECTED NOT DETECTED Final   Stenotrophomonas maltophilia NOT DETECTED NOT DETECTED Final   Candida albicans NOT DETECTED NOT DETECTED Final   Candida auris NOT DETECTED NOT DETECTED Final   Candida glabrata NOT DETECTED NOT DETECTED Final   Candida krusei NOT DETECTED NOT DETECTED Final   Candida parapsilosis NOT DETECTED NOT DETECTED Final   Candida tropicalis NOT DETECTED NOT DETECTED Final   Cryptococcus neoformans/gattii NOT DETECTED NOT DETECTED Final   CTX-M ESBL NOT DETECTED NOT DETECTED Final   Carbapenem resistance IMP NOT DETECTED NOT DETECTED Final   Carbapenem resistance KPC NOT DETECTED NOT DETECTED Final    Carbapenem resistance NDM NOT DETECTED NOT DETECTED Final   Carbapenem resist OXA 48 LIKE NOT DETECTED NOT DETECTED Final   Carbapenem resistance VIM NOT DETECTED NOT DETECTED Final    Comment: Performed at [facility], [address] [place], [place] [address]  Blood Culture (routine x 2)     Status: Abnormal   Collection Time: 06/21/22  4:01 PM   Specimen: BLOOD  Result Value Ref Range Status   Specimen Description   Final    BLOOD RIGHT ANTECUBITAL Performed at [facility], [address] [place], [place] [address]    Special Requests   Final    BOTTLES DRAWN AEROBIC AND ANAEROBIC Blood Culture results may not be optimal due to an excessive volume of blood received in culture bottles Performed at [facility], [address] [place], [place] [address]    Culture  Setup Time   Final    GRAM NEGATIVE RODS IN BOTH AEROBIC AND ANAEROBIC BOTTLES CRITICAL RESULT CALLED TO, READ BACK BY AND VERIFIED WITH: Dawayne Cirri 9147 06/22/22 LFD Performed at [facility], [address] [place], [place] [address]    Culture (A)  Final    ESCHERICHIA COLI SUSCEPTIBILITIES PERFORMED ON PREVIOUS CULTURE WITHIN THE LAST 5 DAYS. Performed at [facility], [address] [place], [place] [address]    Report Status 06/24/2022 FINAL  Final  Resp panel by RT-PCR (RSV, Flu A&B, Covid)     Status: None   Collection Time: 06/21/22  4:01 PM  Result Value Ref Range Status   SARS Coronavirus 2 by RT PCR NEGATIVE NEGATIVE Final    Comment: (NOTE) SARS-CoV-2 target nucleic acids are NOT DETECTED.  The SARS-CoV-2 RNA is generally detectable in upper respiratory specimens during the acute phase of infection. The lowest concentration of SARS-CoV-2 viral copies this assay can detect is 138 copies/mL. A negative result  does not preclude SARS-Cov-2 infection and should not be used as the sole basis for treatment or other patient management decisions. A  negative result may occur with  improper specimen collection/handling, submission of specimen other than nasopharyngeal swab, presence of viral mutation(s) within the areas targeted by this assay, and inadequate number of viral copies(<138 copies/mL). A negative result must be combined with clinical observations, patient history, and epidemiological information. The expected result is Negative.  Fact Sheet for Patients:  BloggerCourse.com  Fact Sheet for Healthcare Providers:  SeriousBroker.it  This test is no t yet approved or cleared by the [place] FDA and  has been authorized for detection and/or diagnosis of SARS-CoV-2 by FDA under an Emergency Use Authorization (EUA). This EUA will remain  in effect (meaning this test can be used) for the duration of the COVID-19 declaration under Section 564(b)(1) of the Act, 21 U.S.C.section 360bbb-3(b)(1), unless the authorization is terminated  or revoked sooner.       Influenza A by PCR NEGATIVE NEGATIVE Final   Influenza B by PCR NEGATIVE NEGATIVE Final    Comment: (NOTE) The Xpert Xpress SARS-CoV-2/FLU/RSV plus assay is intended as an aid in the diagnosis of influenza from Nasopharyngeal swab specimens and should not be used as a sole basis for treatment. Nasal washings and aspirates are unacceptable for Xpert Xpress SARS-CoV-2/FLU/RSV testing.  Fact Sheet for Patients: BloggerCourse.com  Fact Sheet for Healthcare Providers: SeriousBroker.it  This test is not yet approved or cleared by the [place] FDA and has been authorized for detection and/or diagnosis of SARS-CoV-2 by FDA under an Emergency Use Authorization (EUA). This EUA will remain in effect (meaning this test can be used) for the duration of the COVID-19 declaration under Section 564(b)(1) of the Act, 21 U.S.C. section 360bbb-3(b)(1), unless the authorization  is terminated or revoked.     Resp Syncytial Virus by PCR NEGATIVE NEGATIVE Final    Comment: (NOTE) Fact Sheet for Patients: BloggerCourse.com  Fact Sheet for Healthcare Providers: SeriousBroker.it  This test is not yet approved or cleared by the [place] FDA and has been authorized for detection and/or diagnosis of SARS-CoV-2 by FDA under an Emergency Use Authorization (EUA). This EUA will remain in effect (meaning this test can be used) for the duration of the COVID-19 declaration under Section 564(b)(1) of the Act, 21 U.S.C. section 360bbb-3(b)(1), unless the authorization is terminated or revoked.  Performed at [facility], [address], [place], [place] [address]   Urine Culture     Status: Abnormal   Collection Time: 06/21/22  7:47 PM   Specimen: Urine, Random  Result Value Ref Range Status   Specimen Description   Final    URINE, RANDOM Performed at [facility], [address] [place], [place] [address]    Special Requests   Final    NONE Reflexed from [id number] Performed at [facility], [address] [place], [place] [address]    Culture MULTIPLE SPECIES PRESENT, SUGGEST RECOLLECTION (A)  Final   Report Status 06/23/2022 FINAL  Final  MRSA Next Gen by PCR, Nasal     Status: Abnormal   Collection Time: 06/21/22 11:02 PM   Specimen: Nasal Mucosa; Nasal Swab  Result Value Ref Range Status   MRSA by PCR Next Gen DETECTED (A) NOT DETECTED Final    Comment: CRITICAL RESULT CALLED TO, READ BACK BY AND VERIFIED WITH: SCRIVNER,IVAN RN @ 0210 06/22/22 LFD Performed at [facility], [address], [place], [place]  [address]     Radiology Studies: DG C-Arm 1-60 Min-No Report  Result Date: 06/25/2022 Fluoroscopy was utilized by the requesting physician.  No radiographic interpretation.   US Abdomen Limited RUQ (LIVER/GB)  Result Date: 06/24/2022 CLINICAL DATA:  Abdominal  pain EXAM: ULTRASOUND ABDOMEN LIMITED RIGHT UPPER QUADRANT COMPARISON:  None Available. FINDINGS: Gallbladder: Dilated gallbladder. Borderline wall thickening. No shadowing stones. There is some sludge. There is some adjacent fluid as well to the gallbladder. Common bile duct: Diameter: 6 mm Liver: Nodular contours of the liver with mild ascites. Right-sided pleural effusion. Portal vein is patent on color Doppler imaging with normal direction of blood flow towards the liver. Other: None. IMPRESSION: Distended gallbladder with sludge.  No stones. There is some wall thickening and adjacent fluid although there is a nodular appearance of the liver with the ascites. Please correlate for chronic liver disease. Overall if there is further concern of acalculous cholecystitis with the dilatation and wall thickening, HIDA scan may be useful as the next step in the workup Electronically Signed   By: Karen Kays M.D.   On: 06/24/2022 12:05   CT RENAL STONE STUDY  Result Date: 06/23/2022 CLINICAL DATA:  Abdominal/flank pain, stone suspected EXAM: CT ABDOMEN AND PELVIS WITHOUT CONTRAST TECHNIQUE: Multidetector CT imaging of the abdomen and pelvis was performed following the standard protocol without IV contrast. RADIATION DOSE REDUCTION: This exam was performed according to the departmental dose-optimization program which includes automated exposure control, adjustment of the mA and/or kV according to patient size and/or use of iterative reconstruction technique. COMPARISON:  Contrast enhanced CT 04/13/2021 FINDINGS: Lower chest: Small to moderate bilateral pleural effusions. Associated bibasilar airspace disease. Additional airspace disease in the lingula and right middle lobe is chronic when compared with prior abdominal CT. There are coronary artery calcifications. Mitral annulus calcifications. The heart is enlarged. Hepatobiliary: Motion artifact through the liver, allowing for this, no evidence of focal liver lesion.  The gallbladder is distended. Assessment for pericholecystic inflammation is limited due to motion through this region. The tiny gallstones on prior exam are not definitively seen. The common bile duct and biliary tree are difficult to define. There is no obvious biliary dilatation. Pancreas: Parenchymal atrophy. Motion artifact through the proximal pancreas. There is no pancreatic inflammation. Spleen: Normal in size without focal abnormality. Adrenals/Urinary Tract: No adrenal nodule. There is a 2 cm stone in the left renal pelvis extending into the proximal ureter with moderate hydronephrosis. 14 mm no obstructing stone in the lower pole of the left kidney and 3 mm nonobstructing stone in the upper pole of the left kidney. No significant left perinephric edema. Right renal cortical atrophy. Calcification in the posterior mid upper kidney likely represents a combination of parenchymal calcification a nonobstructing stone, linear 11 mm calcification may represent a stone fragment in the renal collecting system. Previous proximal right ureteral stone is no longer seen. There is mild right perirenal scarring. No definite ureteral calculi. Nondistended urinary bladder. No bladder stone. Stomach/Bowel: No bowel obstruction or inflammation. The appendix not confidently visualized, no appendicitis. Small to moderate volume of stool throughout the colon. Occasional colonic diverticula, no diverticulitis. Vascular/Lymphatic: Moderate aortic atherosclerosis. Aortic tortuosity but no aneurysm. Assessment for adenopathy is limited in the absence of contrast and patient motion. No obvious bulky enlarged lymph nodes. Reproductive: Hysterectomy. Small amount of air in the vagina is nonspecific. No adnexal mass. Other: Small volume of free fluid in the pelvis. Trace free fluid in the right upper quadrant. There is mild  body wall subcutaneous edema. No free intra-abdominal air. Musculoskeletal: Interbody spacer at L4-L5. Mild  compression deformities versus Schmorl's nodes within superior endplate of T11, L1 and L3, chronic. The bones are subjectively under mineralized. Left proximal femur surgical hardware is partially included. No acute osseous findings. IMPRESSION: 1. Obstructing 2 cm stone in the left renal pelvis extending into the proximal ureter with moderate hydronephrosis. Additional nonobstructing stones in the left kidney. 2. Mild right renal scarring. Calcifications in the right kidney may be combination of parenchymal calcifications and stone fragments. 3. Hydropic gallbladder. Assessment for pericholecystic inflammation is limited due to motion through this region. The tiny gallstones on prior exam are not definitively seen. If there is clinical concern for acute gallbladder pathology, recommend right upper quadrant ultrasound. 4. Small to moderate bilateral pleural effusions with adjacent airspace disease, likely atelectasis. Additional airspace disease in the lingula and right middle lobe is chronic when compared with prior abdominal CT. 5. Small volume of free fluid in the pelvis and right upper quadrant, nonspecific. Mild body wall edema. Aortic Atherosclerosis (ICD10-I70.0). Electronically Signed   By: Narda Rutherford M.D.   On: 06/23/2022 18:18    Scheduled Meds:  amiodarone  400 mg Oral BID   Chlorhexidine Gluconate Cloth  6 each Topical Q0600   cholecalciferol  2,000 Units Oral Daily   donepezil  10 mg Oral QHS   escitalopram  5 mg Oral Daily   feeding supplement  237 mL Oral BID BM   ferrous sulfate  324 mg Oral Q breakfast   heparin  5,000 Units Subcutaneous Q8H   melatonin  10 mg Oral QHS   multivitamin with minerals  1 tablet Oral Daily   mupirocin ointment  1 Application Nasal BID   senna-docusate  1 tablet Oral Daily   Continuous Infusions:  sodium chloride 10 mL/hr at 06/22/22 0900   cefTRIAXone (ROCEPHIN)  IV 2 g (06/25/22 0944)   lactated ringers 75 mL/hr at 06/25/22 1021     LOS: 4  days    Time spent: 35 mins    Willeen Niece, MD [facility]   If 7PM-7AM, please contact night-coverage

## 2022-06-25 NOTE — Progress Notes (Signed)
OR RN's transported patient to OR via bed.

## 2022-06-26 ENCOUNTER — Encounter: Payer: Self-pay | Admitting: Urology

## 2022-06-26 ENCOUNTER — Telehealth: Payer: Self-pay | Admitting: Urology

## 2022-06-26 ENCOUNTER — Inpatient Hospital Stay (HOSPITAL_COMMUNITY)
Admit: 2022-06-26 | Discharge: 2022-06-26 | Disposition: A | Discharge status: Home / Self Care | Payer: Medicare Other | Attending: Cardiovascular Disease | Admitting: Cardiovascular Disease

## 2022-06-26 DIAGNOSIS — I420 Dilated cardiomyopathy: Secondary | ICD-10-CM

## 2022-06-26 DIAGNOSIS — N130 Hydronephrosis with ureteropelvic junction obstruction: Secondary | ICD-10-CM

## 2022-06-26 DIAGNOSIS — R6520 Severe sepsis without septic shock: Secondary | ICD-10-CM | POA: Diagnosis not present

## 2022-06-26 DIAGNOSIS — E872 Acidosis, unspecified: Secondary | ICD-10-CM | POA: Diagnosis not present

## 2022-06-26 DIAGNOSIS — A419 Sepsis, unspecified organism: Secondary | ICD-10-CM | POA: Diagnosis not present

## 2022-06-26 DIAGNOSIS — I480 Paroxysmal atrial fibrillation: Secondary | ICD-10-CM

## 2022-06-26 DIAGNOSIS — I4891 Unspecified atrial fibrillation: Secondary | ICD-10-CM | POA: Diagnosis not present

## 2022-06-26 DIAGNOSIS — I5020 Unspecified systolic (congestive) heart failure: Secondary | ICD-10-CM

## 2022-06-26 HISTORY — DX: Dilated cardiomyopathy: I42.0

## 2022-06-26 HISTORY — DX: Unspecified systolic (congestive) heart failure: I50.20

## 2022-06-26 LAB — ECHOCARDIOGRAM COMPLETE
AR max vel: 2.31 cm2
AV Area VTI: 2.73 cm2
AV Area mean vel: 2.13 cm2
AV Mean grad: 2 mmHg
AV Peak grad: 3.7 mmHg
Ao pk vel: 0.97 m/s
Area-P 1/2: 6.07 cm2
Calc EF: 28.7 %
Height: 70 in
MV VTI: 2.14 cm2
S' Lateral: 3.5 cm
Single Plane A2C EF: 29 %
Single Plane A4C EF: 37.3 %
Weight: 3675.51 oz

## 2022-06-26 LAB — PHOSPHORUS: Phosphorus: 3.7 mg/dL (ref 2.5–4.6)

## 2022-06-26 LAB — CBC
HCT: 27.2 % — ABNORMAL LOW (ref 36.0–46.0)
Hemoglobin: 9.2 g/dL — ABNORMAL LOW (ref 12.0–15.0)
MCH: 31.8 pg (ref 26.0–34.0)
MCHC: 33.8 g/dL (ref 30.0–36.0)
MCV: 94.1 fL (ref 80.0–100.0)
Platelets: 114 10*3/uL — ABNORMAL LOW (ref 150–400)
RBC: 2.89 MIL/uL — ABNORMAL LOW (ref 3.87–5.11)
RDW: 14.3 % (ref 11.5–15.5)
WBC: 10.9 10*3/uL — ABNORMAL HIGH (ref 4.0–10.5)
nRBC: 0 % (ref 0.0–0.2)

## 2022-06-26 LAB — BASIC METABOLIC PANEL
Anion gap: 10 (ref 5–15)
BUN: 46 mg/dL — ABNORMAL HIGH (ref 8–23)
CO2: 22 mmol/L (ref 22–32)
Calcium: 7.9 mg/dL — ABNORMAL LOW (ref 8.9–10.3)
Chloride: 105 mmol/L (ref 98–111)
Creatinine, Ser: 1.22 mg/dL — ABNORMAL HIGH (ref 0.44–1.00)
GFR, Estimated: 46 mL/min — ABNORMAL LOW (ref >60)
Glucose, Bld: 102 mg/dL — ABNORMAL HIGH (ref 70–99)
Potassium: 3.2 mmol/L — ABNORMAL LOW (ref 3.5–5.1)
Sodium: 137 mmol/L (ref 135–145)

## 2022-06-26 LAB — MAGNESIUM: Magnesium: 1.6 mg/dL — ABNORMAL LOW (ref 1.7–2.4)

## 2022-06-26 LAB — URINE CULTURE

## 2022-06-26 MED ORDER — AMIODARONE HCL 200 MG PO TABS: 200.0000 mg | ORAL_TABLET | Freq: Two times a day (BID) | ORAL | Status: DC

## 2022-06-26 MED ORDER — POTASSIUM CHLORIDE CRYS ER 20 MEQ PO TBCR
40.0000 meq | EXTENDED_RELEASE_TABLET | Freq: Once | ORAL | Status: AC
  Administered 2022-06-26: 40 meq via ORAL
  Filled 2022-06-26: qty 2

## 2022-06-26 MED ORDER — FUROSEMIDE 10 MG/ML IJ SOLN
40.0000 mg | Freq: Once | INTRAMUSCULAR | Status: AC
  Administered 2022-06-26: 40 mg via INTRAVENOUS
  Filled 2022-06-26: qty 4

## 2022-06-26 MED ORDER — FUROSEMIDE 10 MG/ML IJ SOLN
40.0000 mg | Freq: Two times a day (BID) | INTRAMUSCULAR | Status: DC
  Administered 2022-06-26 – 2022-06-28 (×4): 40 mg via INTRAVENOUS
  Filled 2022-06-26 (×4): qty 4

## 2022-06-26 MED ORDER — FUROSEMIDE 10 MG/ML IJ SOLN: 40.0000 mg | Freq: Once | INTRAMUSCULAR | Status: DC

## 2022-06-26 MED ORDER — MAGNESIUM SULFATE 2 GM/50ML IV SOLN
2.0000 g | Freq: Once | INTRAVENOUS | Status: AC
  Administered 2022-06-26: 2 g via INTRAVENOUS
  Filled 2022-06-26: qty 50

## 2022-06-26 MED ORDER — METOPROLOL TARTRATE 5 MG/5ML IV SOLN
5.0000 mg | Freq: Once | INTRAVENOUS | Status: AC
  Administered 2022-06-26: 5 mg via INTRAVENOUS
  Filled 2022-06-26: qty 5

## 2022-06-26 MED ORDER — METOPROLOL TARTRATE 25 MG PO TABS
25.0000 mg | ORAL_TABLET | Freq: Two times a day (BID) | ORAL | Status: DC
  Administered 2022-06-26 – 2022-06-27 (×3): 25 mg via ORAL
  Filled 2022-06-26 (×3): qty 1

## 2022-06-26 MED ORDER — METOPROLOL TARTRATE 5 MG/5ML IV SOLN
5.0000 mg | INTRAVENOUS | Status: DC | PRN
  Administered 2022-06-26: 5 mg via INTRAVENOUS
  Filled 2022-06-26: qty 5

## 2022-06-26 MED ORDER — METOPROLOL TARTRATE 5 MG/5ML IV SOLN: 5.0000 mg | INTRAVENOUS | Status: DC | PRN

## 2022-06-26 NOTE — Progress Notes (Signed)
PROGRESS NOTE    Alison Price  AVW:[id number] DOB: [birth year] DOA: 06/21/2022 PCP: Jaclyn Shaggy, MD   Brief Narrative:  This 77 yrs old  female with PMH significant of dementia, hypertension, GERD, arthritis, Stevens-Johnson's syndrome, recurrent falls with recent right patella and left wrist fracture who presented to the ED with chief complaints of altered mental status.  She was found to have severe sepsis secondary to UTI.  She was started on IV antibiotics.  CT renal study showed obstructing stone causing hydronephrosis. Urology is consulted,  Patient underwent cystoscopy with left ureteral stent placement.  Post procedure patient developed A-fib with RVR and was given a dose of metoprolol and she converted to normal sinus rhythm.  Cardiology is consulted.   Assessment & Plan:   Principal Problem:   Severe sepsis with lactic acidosis (HCC) Active Problems:   Hydronephrosis with urinary obstruction due to renal calculus   Nephrolithiasis   AKI (acute kidney injury) (HCC)   Lower urinary tract infectious disease   Altered mental status   Lactic acidosis   PAF (paroxysmal atrial fibrillation) (HCC)  Severe sepsis /shock due to E. coli UTI: E. coli bacteremia / pneumonia: H/o Ureteral stone in 2022 (had cystoscopy with stent placement and h/o Lithotripsy): Patient presented with septic shock requiring IV fluids / vasopressors. Sepsis physiology improving. Blood culture positive for E. Coli. Continue IV Rocephin , change to oral at discharge. She remains afebrile.  Lactic acid normalized with IV hydration. Procalcitonin trended down 150 > 33.99. Patient currently on room air. No cough  Leukocytosis: Presented with WBC 4.8 trended up to 19.8--22K. Likely secondary to UTI, continue IV ceftriaxone. Leucocytosis resolved.   Paroxysmal  New onset A. fib in the setting of sepsis >  Now resolved She was started on IV amiodarone drip. Heart rate  now ranges between 57-70 Post  procedure patient went into A-fib again with RVR , given a dose of metoprolol  She converted to normal sinus rhythm. She is not a candidate for anticoagulation given recurrent falls. Overnight patient has intermittent A-fib converted to sinus rhythm this morning. Continue amiodarone 400 twice daily and metoprolol 25 mg bid.  Acute on chronic kidney disease stage IV: Baseline serum creatinine( 0.9--1.22 in sept 2022).  Could not find any other labs prior to this. Continue IV fluid resuscitation. Avoid nephrotoxic medications.  Creatinine is improving 3.07 > 2.70 > 2.68 > 2.25 > 1.93>1.62 >1.22  Left hydronephrosis : CT renal study shows obstructing stone causing hydronephrosis. Urology is consulted.  Patient underwent cystoscopy with left ureteral stent placement. Continue antibiotics and urology follow-up. Urology recommended 7 to 10 days of culture specific antibiotics and outpatient follow-up.   Hypomagnesimia Replaced. Continue to monitor   GERD: Pantoprazole 40 mg daily.   Generalized weakness with deconditioning: PT and OT evaluation.   Dementia without behavioral problems: Sister Alison Price who is next of kin.  She reports patient has dementia. Patient lives with Alison Price at home. She is fairly independent. TOC for discharge planning   Hypokalemia: Replaced. Continue to monitor  DVT prophylaxis: Heparin Code Status: DNR Family Communication: No family at bedside Disposition Plan:  Status is: Inpatient Remains inpatient appropriate because: Admitted for severe sepsis secondary to E. coli bacteremia, UTI.  CT renal study shows kidney stone causing hydronephrosis.  Urology is consulted.  Continued on IV antibiotics.  Underwent cystoscopy with left ureteral stent placement.  Consultants:  Urology  Procedures: CT renal study.  Antimicrobials:  Anti-infectives (From admission, onward)  Start     Dose/Rate Route Frequency Ordered Stop   06/22/22 1000  azithromycin  (ZITHROMAX) 500 mg in sodium chloride 0.9 % 250 mL IVPB  Status:  Discontinued        500 mg 250 mL/hr over 60 Minutes Intravenous Every 24 hours 06/21/22 2251 06/22/22 0913   06/22/22 0800  cefTRIAXone (ROCEPHIN) 2 g in sodium chloride 0.9 % 100 mL IVPB        2 g 200 mL/hr over 30 Minutes Intravenous Every 24 hours 06/21/22 2251     06/21/22 1700  ceFEPIme (MAXIPIME) 2 g in sodium chloride 0.9 % 100 mL IVPB        2 g 200 mL/hr over 30 Minutes Intravenous  Once 06/21/22 1652 06/21/22 1713   06/21/22 1700  vancomycin (VANCOCIN) IVPB 1000 mg/200 mL premix        1,000 mg 200 mL/hr over 60 Minutes Intravenous  Once 06/21/22 1652 06/21/22 1814       Subjective: Patient was seen and examined at bedside.  Overnight events noted.   Patient has baseline dementia. She follows commands.  She is oriented to self only. She underwent cystoscopy with left ureteral stent placement.   Overnight she remains in A-fib requiring several doses of metoprolol. She is now converted to normal sinus rhythm.   Objective: Vitals:   06/26/22 0527 06/26/22 0603 06/26/22 0807 06/26/22 1202  BP: 137/79  (!) 137/92 123/79  Pulse:   69 62  Resp: 20  17 16   Temp: 97.9 F (36.6 C)  97.7 F (36.5 C) 98.2 F (36.8 C)  TempSrc:      SpO2: 100%  100% 100%  Weight:  104.2 kg    Height:        Intake/Output Summary (Last 24 hours) at 06/26/2022 1352 Last data filed at 06/26/2022 1300 Gross per 24 hour  Intake 680 ml  Output 3200 ml  Net -2520 ml   Filed Weights   06/22/22 0700 06/24/22 0500 06/26/22 0603  Weight: 58.4 kg 59 kg 104.2 kg    Examination:  General exam: Appears comfortable, deconditioned, not in any acute distress. Respiratory system: CTA bilaterally, respiratory effort normal.  RR 15. Cardiovascular system: S1 & S2 heard, RRR. No JVD, murmurs, rubs, gallops or clicks.  Gastrointestinal system: Abdomen is soft, non tender, non distended, BS+ Central nervous system: Alert and oriented x 1.  No focal neurological deficits. Extremities: No edema, no cyanosis, no clubbing. Skin: No rashes, lesions or ulcers Psychiatry: Judgement and insight appear normal. Mood & affect appropriate.     Data Reviewed: I have personally reviewed following labs and imaging studies  CBC: Recent Labs  Lab 06/21/22 1601 06/22/22 0502 06/23/22 0549 06/24/22 0421 06/25/22 0622 06/26/22 0540  WBC 4.9 19.8* 22.2* 11.6* 8.7 10.9*  NEUTROABS 3.5  --   --   --   --   --   HGB 12.1 9.3* 9.7* 8.9* 9.5* 9.2*  HCT 35.7* 27.0* 29.4* 25.9* 28.7* 27.2*  MCV 93.2 91.8 93.3 92.2 93.8 94.1  PLT 43* PLATELET CLUMPS NOTED ON SMEAR, UNABLE TO ESTIMATE 33* 40* 72* 114*   Basic Metabolic Panel: Recent Labs  Lab 06/22/22 0502 06/23/22 0835 06/24/22 0421 06/25/22 0622 06/26/22 0540  NA 136 139 139 141 137  K 4.1 4.1 3.5 3.5 3.2*  CL 104 106 107 109 105  CO2 20* 19* 21* 23 22  GLUCOSE 93 81 92 94 102*  BUN 64* 69* 68* 63* 46*  CREATININE 2.68*  2.25* 1.93* 1.62* 1.22*  CALCIUM 8.0* 8.0* 8.2* 8.2* 7.9*  MG 1.2*  --  2.1 1.9 1.6*  PHOS 3.9  --   --  3.8 3.7   GFR: Estimated Creatinine Clearance: 51.3 mL/min (A) (by C-G formula based on SCr of 1.22 mg/dL (H)). Liver Function Tests: Recent Labs  Lab 06/21/22 1601  AST 46*  ALT 12  ALKPHOS 134*  BILITOT 1.3*  PROT 6.8  ALBUMIN 3.6   No results for input(s): "LIPASE", "AMYLASE" in the last 168 hours. No results for input(s): "AMMONIA" in the last 168 hours. Coagulation Profile: No results for input(s): "INR", "PROTIME" in the last 168 hours. Cardiac Enzymes: No results for input(s): "CKTOTAL", "CKMB", "CKMBINDEX", "TROPONINI" in the last 168 hours. BNP (last 3 results) No results for input(s): "PROBNP" in the last 8760 hours. HbA1C: No results for input(s): "HGBA1C" in the last 72 hours. CBG: Recent Labs  Lab 06/21/22 2243  GLUCAP 92   Lipid Profile: No results for input(s): "CHOL", "HDL", "LDLCALC", "TRIG", "CHOLHDL", "LDLDIRECT" in  the last 72 hours. Thyroid Function Tests: No results for input(s): "TSH", "T4TOTAL", "FREET4", "T3FREE", "THYROIDAB" in the last 72 hours.  Anemia Panel: No results for input(s): "VITAMINB12", "FOLATE", "FERRITIN", "TIBC", "IRON", "RETICCTPCT" in the last 72 hours. Sepsis Labs: Recent Labs  Lab 06/21/22 1601 06/21/22 1947 06/21/22 2358 06/25/22 0622  PROCALCITON >150.00  --   --  33.99  LATICACIDVEN 4.4* 5.5* 2.7* 1.2    Recent Results (from the past 240 hour(s))  Blood Culture (routine x 2)     Status: Abnormal   Collection Time: 06/21/22  4:00 PM   Specimen: BLOOD RIGHT FOREARM  Result Value Ref Range Status   Specimen Description   Final    BLOOD RIGHT FOREARM Performed at [facility], [address], [place], [place] [address]    Special Requests   Final    BOTTLES DRAWN AEROBIC AND ANAEROBIC Blood Culture adequate volume Performed at [facility], [address] [place], [place] [address]    Culture  Setup Time   Final    IN BOTH AEROBIC AND ANAEROBIC BOTTLES GRAM NEGATIVE RODS CRITICAL RESULT CALLED TO, READ BACK BY AND VERIFIED WITHDawayne Cirri @ 0981 06/22/22 LFD Performed at [facility], [address], [place], [place] [address]    Culture ESCHERICHIA COLI (A)  Final   Report Status 06/24/2022 FINAL  Final   Organism ID, Bacteria ESCHERICHIA COLI  Final      Susceptibility   Escherichia coli - MIC*    AMPICILLIN 8 SENSITIVE Sensitive     CEFEPIME <=0.12 SENSITIVE Sensitive     CEFTAZIDIME <=1 SENSITIVE Sensitive     CEFTRIAXONE <=0.25 SENSITIVE Sensitive     CIPROFLOXACIN <=0.25 SENSITIVE Sensitive     GENTAMICIN <=1 SENSITIVE Sensitive     IMIPENEM <=0.25 SENSITIVE Sensitive     TRIMETH/SULFA <=20 SENSITIVE Sensitive     AMPICILLIN/SULBACTAM 4 SENSITIVE Sensitive     PIP/TAZO <=4 SENSITIVE Sensitive     * ESCHERICHIA COLI  Blood Culture ID Panel (Reflexed)     Status: Abnormal   Collection Time: 06/21/22  4:00 PM  Result  Value Ref Range Status   Enterococcus faecalis NOT DETECTED NOT DETECTED Final   Enterococcus Faecium NOT DETECTED NOT DETECTED Final   Listeria monocytogenes NOT DETECTED NOT DETECTED Final   Staphylococcus species NOT DETECTED NOT DETECTED Final   Staphylococcus aureus (BCID) NOT DETECTED NOT DETECTED Final   Staphylococcus epidermidis  NOT DETECTED NOT DETECTED Final   Staphylococcus lugdunensis NOT DETECTED NOT DETECTED Final   Streptococcus species NOT DETECTED NOT DETECTED Final   Streptococcus agalactiae NOT DETECTED NOT DETECTED Final   Streptococcus pneumoniae NOT DETECTED NOT DETECTED Final   Streptococcus pyogenes NOT DETECTED NOT DETECTED Final   A.calcoaceticus-baumannii NOT DETECTED NOT DETECTED Final   Bacteroides fragilis NOT DETECTED NOT DETECTED Final   Enterobacterales DETECTED (A) NOT DETECTED Final    Comment: Enterobacterales represent a large order of gram negative bacteria, not a single organism. CRITICAL RESULT CALLED TO, READ BACK BY AND VERIFIED WITH: NATHAN BELUE @ 0519 06/22/22 LFD    Enterobacter cloacae complex NOT DETECTED NOT DETECTED Final   Escherichia coli DETECTED (A) NOT DETECTED Final    Comment: CRITICAL RESULT CALLED TO, READ BACK BY AND VERIFIED WITH: NATHAN BELUE @ 0519 06/22/22 LFD    Klebsiella aerogenes NOT DETECTED NOT DETECTED Final   Klebsiella oxytoca NOT DETECTED NOT DETECTED Final   Klebsiella pneumoniae NOT DETECTED NOT DETECTED Final   Proteus species NOT DETECTED NOT DETECTED Final   Salmonella species NOT DETECTED NOT DETECTED Final   Serratia marcescens NOT DETECTED NOT DETECTED Final   Haemophilus influenzae NOT DETECTED NOT DETECTED Final   Neisseria meningitidis NOT DETECTED NOT DETECTED Final   Pseudomonas aeruginosa NOT DETECTED NOT DETECTED Final   Stenotrophomonas maltophilia NOT DETECTED NOT DETECTED Final   Candida albicans NOT DETECTED NOT DETECTED Final   Candida auris NOT DETECTED NOT DETECTED Final   Candida  glabrata NOT DETECTED NOT DETECTED Final   Candida krusei NOT DETECTED NOT DETECTED Final   Candida parapsilosis NOT DETECTED NOT DETECTED Final   Candida tropicalis NOT DETECTED NOT DETECTED Final   Cryptococcus neoformans/gattii NOT DETECTED NOT DETECTED Final   CTX-M ESBL NOT DETECTED NOT DETECTED Final   Carbapenem resistance IMP NOT DETECTED NOT DETECTED Final   Carbapenem resistance KPC NOT DETECTED NOT DETECTED Final   Carbapenem resistance NDM NOT DETECTED NOT DETECTED Final   Carbapenem resist OXA 48 LIKE NOT DETECTED NOT DETECTED Final   Carbapenem resistance VIM NOT DETECTED NOT DETECTED Final    Comment: Performed at [facility], [address] [place], [place] [address]  Blood Culture (routine x 2)     Status: Abnormal   Collection Time: 06/21/22  4:01 PM   Specimen: BLOOD  Result Value Ref Range Status   Specimen Description   Final    BLOOD RIGHT ANTECUBITAL Performed at [facility], [address] [place], [place] [address]    Special Requests   Final    BOTTLES DRAWN AEROBIC AND ANAEROBIC Blood Culture results may not be optimal due to an excessive volume of blood received in culture bottles Performed at [facility], [address] [place], [place] [address]    Culture  Setup Time   Final    GRAM NEGATIVE RODS IN BOTH AEROBIC AND ANAEROBIC BOTTLES CRITICAL RESULT CALLED TO, READ BACK BY AND VERIFIED WITH: Dawayne Cirri [id number] 06/22/22 LFD Performed at [facility], [address] [place], [place] [address]    Culture (A)  Final    ESCHERICHIA COLI SUSCEPTIBILITIES PERFORMED ON PREVIOUS CULTURE WITHIN THE LAST 5 DAYS. Performed at [facility], [address], [place], [place] [address]    Report Status 06/24/2022 FINAL  Final  Resp panel by RT-PCR (RSV, Flu A&B, Covid)     Status: None   Collection Time: 06/21/22  4:01 PM  Result Value  Ref Range Status   SARS Coronavirus 2 by RT PCR NEGATIVE NEGATIVE Final     Comment: (NOTE) SARS-CoV-2 target nucleic acids are NOT DETECTED.  The SARS-CoV-2 RNA is generally detectable in upper respiratory specimens during the acute phase of infection. The lowest concentration of SARS-CoV-2 viral copies this assay can detect is 138 copies/mL. A negative result does not preclude SARS-Cov-2 infection and should not be used as the sole basis for treatment or other patient management decisions. A negative result may occur with  improper specimen collection/handling, submission of specimen other than nasopharyngeal swab, presence of viral mutation(s) within the areas targeted by this assay, and inadequate number of viral copies(<138 copies/mL). A negative result must be combined with clinical observations, patient history, and epidemiological information. The expected result is Negative.  Fact Sheet for Patients:  BloggerCourse.com  Fact Sheet for Healthcare Providers:  SeriousBroker.it  This test is no t yet approved or cleared by the [place] FDA and  has been authorized for detection and/or diagnosis of SARS-CoV-2 by FDA under an Emergency Use Authorization (EUA). This EUA will remain  in effect (meaning this test can be used) for the duration of the COVID-19 declaration under Section 564(b)(1) of the Act, 21 U.S.C.section 360bbb-3(b)(1), unless the authorization is terminated  or revoked sooner.       Influenza A by PCR NEGATIVE NEGATIVE Final   Influenza B by PCR NEGATIVE NEGATIVE Final    Comment: (NOTE) The Xpert Xpress SARS-CoV-2/FLU/RSV plus assay is intended as an aid in the diagnosis of influenza from Nasopharyngeal swab specimens and should not be used as a sole basis for treatment. Nasal washings and aspirates are unacceptable for Xpert Xpress SARS-CoV-2/FLU/RSV testing.  Fact Sheet for Patients: BloggerCourse.com  Fact Sheet for Healthcare  Providers: SeriousBroker.it  This test is not yet approved or cleared by the [place] FDA and has been authorized for detection and/or diagnosis of SARS-CoV-2 by FDA under an Emergency Use Authorization (EUA). This EUA will remain in effect (meaning this test can be used) for the duration of the COVID-19 declaration under Section 564(b)(1) of the Act, 21 U.S.C. section 360bbb-3(b)(1), unless the authorization is terminated or revoked.     Resp Syncytial Virus by PCR NEGATIVE NEGATIVE Final    Comment: (NOTE) Fact Sheet for Patients: BloggerCourse.com  Fact Sheet for Healthcare Providers: SeriousBroker.it  This test is not yet approved or cleared by the [place] FDA and has been authorized for detection and/or diagnosis of SARS-CoV-2 by FDA under an Emergency Use Authorization (EUA). This EUA will remain in effect (meaning this test can be used) for the duration of the COVID-19 declaration under Section 564(b)(1) of the Act, 21 U.S.C. section 360bbb-3(b)(1), unless the authorization is terminated or revoked.  Performed at [facility], [address], [place], [place] [address]   Urine Culture     Status: Abnormal   Collection Time: 06/21/22  7:47 PM   Specimen: Urine, Random  Result Value Ref Range Status   Specimen Description   Final    URINE, RANDOM Performed at [facility], [address] [place], [place] [address]    Special Requests   Final    NONE Reflexed from [phone] Performed at [facility], [address] [place], [place] [address]    Culture MULTIPLE SPECIES PRESENT, SUGGEST RECOLLECTION (A)  Final   Report Status 06/23/2022 FINAL  Final  MRSA Next Gen by PCR, Nasal     Status: Abnormal   Collection  Time: 06/21/22 11:02 PM   Specimen: Nasal Mucosa; Nasal Swab  Result Value Ref Range Status   MRSA by PCR Next Gen DETECTED (A) NOT DETECTED  Final    Comment: CRITICAL RESULT CALLED TO, READ BACK BY AND VERIFIED WITH: SCRIVNER,IVAN RN @ 0210 06/22/22 LFD Performed at [facility], [address] [place], [place] [address]   Urine Culture     Status: Abnormal (Preliminary result)   Collection Time: 06/25/22  7:54 AM   Specimen: Urine, Cystoscope  Result Value Ref Range Status   Specimen Description   Final    KIDNEY Performed at [facility], [address], [place], [place] [address]    Special Requests   Final    URINE FROM LEFT RENAL PELVIS Performed at [facility], [address] [place], [place] [address]    Culture (A)  Final    10,000 COLONIES/mL ESCHERICHIA COLI SUSCEPTIBILITIES TO FOLLOW Performed at [facility], [address] [place], [place] [address]    Report Status PENDING  Incomplete    Radiology Studies: ECHOCARDIOGRAM COMPLETE  Result Date: 06/26/2022    ECHOCARDIOGRAM REPORT   Patient Name:   Alison Price Date of Exam: 06/26/2022 Medical Rec #:  [id number]           Height:       70.0 in Accession #:    [id number]          Weight:       229.7 lb Date of Birth:  [birth year]           BSA:          2.214 m Patient Age:    77 years            BP:           137/92 mmHg Patient Gender: F                   HR:           69 bpm. Exam Location:  [facility] Procedure: 2D Echo, Cardiac Doppler and Color Doppler Indications:     Atrial Fibrillation I48.9  History:         Patient has no prior history of Echocardiogram examinations.                  Signs/Symptoms:Dyspnea; Risk Factors:Hypertension. Pneumonia.  Sonographer:     Cristela Blue Referring Phys:  [id number] Antonieta Iba Diagnosing Phys: Julien Nordmann MD IMPRESSIONS  1. Left ventricular ejection fraction, by estimation, is 25 to 30%. Left ventricular ejection fraction by 2D MOD biplane is 28.7 %. The left ventricle has severely decreased function. The left ventricle demonstrates global hypokinesis. Left ventricular diastolic  parameters are consistent with Grade II diastolic dysfunction (pseudonormalization).  2. Right ventricular systolic function is mildly reduced. The right ventricular size is normal. There is mildly elevated pulmonary artery systolic pressure. The estimated right ventricular systolic pressure is 37.0 mmHg.  3. Left atrial size was moderately dilated.  4. A small pericardial effusion is present. There is no evidence of cardiac tamponade. Moderate pleural effusion in the left lateral region.  5. The mitral valve is normal in structure. Moderate mitral valve regurgitation. No evidence of mitral stenosis.  6. The aortic valve is normal in structure. Aortic valve regurgitation is trivial. No aortic stenosis is present.  7. The inferior vena cava is normal in size with greater than 50% respiratory variability, suggesting right atrial  pressure of 3 mmHg. FINDINGS  Left Ventricle: Left ventricular ejection fraction, by estimation, is 25 to 30%. Left ventricular ejection fraction by 2D MOD biplane is 28.7 %. The left ventricle has severely decreased function. The left ventricle demonstrates global hypokinesis. The left ventricular internal cavity size was normal in size. There is no left ventricular hypertrophy. Left ventricular diastolic parameters are consistent with Grade II diastolic dysfunction (pseudonormalization). Right Ventricle: The right ventricular size is normal. No increase in right ventricular wall thickness. Right ventricular systolic function is mildly reduced. There is mildly elevated pulmonary artery systolic pressure. The tricuspid regurgitant velocity  is 2.60 m/s, and with an assumed right atrial pressure of 10 mmHg, the estimated right ventricular systolic pressure is 37.0 mmHg. Left Atrium: Left atrial size was moderately dilated. Right Atrium: Right atrial size was normal in size. Pericardium: A small pericardial effusion is present. There is no evidence of cardiac tamponade. Mitral Valve: The mitral  valve is normal in structure. Moderate mitral valve regurgitation. No evidence of mitral valve stenosis. MV peak gradient, 3.9 mmHg. The mean mitral valve gradient is 1.0 mmHg. Tricuspid Valve: The tricuspid valve is normal in structure. Tricuspid valve regurgitation is mild . No evidence of tricuspid stenosis. Aortic Valve: The aortic valve is normal in structure. Aortic valve regurgitation is trivial. No aortic stenosis is present. Aortic valve mean gradient measures 2.0 mmHg. Aortic valve peak gradient measures 3.7 mmHg. Aortic valve area, by VTI measures 2.73 cm. Pulmonic Valve: The pulmonic valve was normal in structure. Pulmonic valve regurgitation is not visualized. No evidence of pulmonic stenosis. Aorta: The aortic root is normal in size and structure. Venous: The inferior vena cava is normal in size with greater than 50% respiratory variability, suggesting right atrial pressure of 3 mmHg. IAS/Shunts: No atrial level shunt detected by color flow Doppler. Additional Comments: There is a moderate pleural effusion in the left lateral region.  LEFT VENTRICLE PLAX 2D                        Biplane EF (MOD) LVIDd:         4.40 cm         LV Biplane EF:   Left LVIDs:         3.50 cm                          ventricular LV PW:         1.70 cm                          ejection LV IVS:        1.20 cm                          fraction by LVOT diam:     2.00 cm                          2D MOD LV SV:         45                               biplane is LV SV Index:   20  28.7 %. LVOT Area:     3.14 cm                                Diastology                                LV e' medial:    4.57 cm/s LV Volumes (MOD)               LV E/e' medial:  21.9 LV vol d, MOD    94.9 ml       LV e' lateral:   6.31 cm/s A2C:                           LV E/e' lateral: 15.8 LV vol d, MOD    113.0 ml A4C: LV vol s, MOD    67.4 ml A2C: LV vol s, MOD    70.9 ml A4C: LV SV MOD A2C:   27.5 ml LV SV MOD A4C:    113.0 ml LV SV MOD BP:    28.4 ml RIGHT VENTRICLE RV Basal diam:  4.50 cm RV Mid diam:    3.30 cm RV S prime:     11.70 cm/s TAPSE (M-mode): 2.2 cm LEFT ATRIUM             Index        RIGHT ATRIUM           Index LA diam:        4.80 cm 2.17 cm/m   RA Area:     17.90 cm LA Vol (A2C):   33.6 ml 15.18 ml/m  RA Volume:   48.90 ml  22.09 ml/m LA Vol (A4C):   46.1 ml 20.82 ml/m LA Biplane Vol: 40.8 ml 18.43 ml/m  AORTIC VALVE AV Area (Vmax):    2.31 cm AV Area (Vmean):   2.13 cm AV Area (VTI):     2.73 cm AV Vmax:           96.60 cm/s AV Vmean:          65.000 cm/s AV VTI:            0.166 m AV Peak Grad:      3.7 mmHg AV Mean Grad:      2.0 mmHg LVOT Vmax:         71.10 cm/s LVOT Vmean:        44.100 cm/s LVOT VTI:          0.144 m LVOT/AV VTI ratio: 0.87  AORTA Ao Root diam: 3.10 cm MITRAL VALVE                TRICUSPID VALVE MV Area (PHT): 6.07 cm     TR Peak grad:   27.0 mmHg MV Area VTI:   2.14 cm     TR Vmax:        260.00 cm/s MV Peak grad:  3.9 mmHg MV Mean grad:  1.0 mmHg     SHUNTS MV Vmax:       0.98 m/s     Systemic VTI:  0.14 m MV Vmean:      56.5 cm/s    Systemic Diam: 2.00 cm MV Decel Time: 125 msec MV E velocity: 100.00 cm/s MV A velocity: 71.30 cm/s MV E/A ratio:  1.40 Marcial Pacas  Mariah Milling MD Electronically signed by Julien Nordmann MD Signature Date/Time: 06/26/2022/12:33:49 PM    Final    DG C-Arm 1-60 Min-No Report  Result Date: 06/25/2022 Fluoroscopy was utilized by the requesting physician.  No radiographic interpretation.    Scheduled Meds:  amiodarone  400 mg Oral BID   cholecalciferol  2,000 Units Oral Daily   donepezil  10 mg Oral QHS   escitalopram  5 mg Oral Daily   feeding supplement  237 mL Oral BID BM   ferrous sulfate  324 mg Oral Q breakfast   furosemide  40 mg Intravenous BID   heparin  5,000 Units Subcutaneous Q8H   melatonin  10 mg Oral QHS   metoprolol tartrate  25 mg Oral BID   multivitamin with minerals  1 tablet Oral Daily   senna-docusate  1 tablet Oral Daily    Continuous Infusions:  sodium chloride 10 mL/hr at 06/26/22 1300   cefTRIAXone (ROCEPHIN)  IV 200 mL/hr at 06/26/22 1300     LOS: 5 days    Time spent: 35 mins    Willeen Niece, MD [facility]   If 7PM-7AM, please contact night-coverage

## 2022-06-26 NOTE — Progress Notes (Signed)
Urology Consult Follow Up  Subjective: POD # 1 cystoscopy with left ureteral stent placement  VSS afebrile on nasal cannula.  Having issues with sustained afib managed by cardiology  Her serum creatinine has decreased from 1.62 with a GFR of 33 yesterday to a 1.22 and GFR 46 this a.m.  Her WBC has ticked up a bit to 10.9 from 8.7 yesterday but this is likely due to stent manipulation.  Her hemoglobin hematocrit are stable.  Her urine culture is pending.  She has a history of dementia and was able to answer questions.   Anti-infectives: Anti-infectives (From admission, onward)    Start     Dose/Rate Route Frequency Ordered Stop   06/22/22 1000  azithromycin (ZITHROMAX) 500 mg in sodium chloride 0.9 % 250 mL IVPB  Status:  Discontinued        500 mg 250 mL/hr over 60 Minutes Intravenous Every 24 hours 06/21/22 2251 06/22/22 0913   06/22/22 0800  cefTRIAXone (ROCEPHIN) 2 g in sodium chloride 0.9 % 100 mL IVPB        2 g 200 mL/hr over 30 Minutes Intravenous Every 24 hours 06/21/22 2251     06/21/22 1700  ceFEPIme (MAXIPIME) 2 g in sodium chloride 0.9 % 100 mL IVPB        2 g 200 mL/hr over 30 Minutes Intravenous  Once 06/21/22 1652 06/21/22 1713   06/21/22 1700  vancomycin (VANCOCIN) IVPB 1000 mg/200 mL premix        1,000 mg 200 mL/hr over 60 Minutes Intravenous  Once 06/21/22 1652 06/21/22 1814       Current Facility-Administered Medications  Medication Dose Route Frequency Provider Last Rate Last Admin   0.9 %  sodium chloride infusion  250 mL Intravenous Continuous Stoioff, Scott C, MD 10 mL/hr at 06/22/22 0900 Infusion Verify at 06/22/22 0900   acetaminophen (TYLENOL) tablet 650 mg  650 mg Oral Q6H PRN Stoioff, Scott C, MD       amiodarone (PACERONE) tablet 400 mg  400 mg Oral BID Ronnald Ramp, RPH   400 mg at 06/26/22 0005   cefTRIAXone (ROCEPHIN) 2 g in sodium chloride 0.9 % 100 mL IVPB  2 g Intravenous Q24H Stoioff, Scott C, MD 200 mL/hr at 06/25/22 0944 2 g at 06/25/22  0944   cholecalciferol (VITAMIN D3) 25 MCG (1000 UNIT) tablet 2,000 Units  2,000 Units Oral Daily Stoioff, Scott C, MD   2,000 Units at 06/25/22 0933   donepezil (ARICEPT) tablet 10 mg  10 mg Oral QHS Stoioff, Scott C, MD   10 mg at 06/25/22 2149   escitalopram (LEXAPRO) tablet 5 mg  5 mg Oral Daily Stoioff, Scott C, MD   5 mg at 06/25/22 0934   feeding supplement (ENSURE ENLIVE / ENSURE PLUS) liquid 237 mL  237 mL Oral BID BM Stoioff, Scott C, MD   237 mL at 06/25/22 1407   ferrous sulfate tablet 324 mg  324 mg Oral Q breakfast Stoioff, Scott C, MD   324 mg at 06/25/22 0933   furosemide (LASIX) injection 40 mg  40 mg Intravenous Once Willeen Niece, MD       heparin injection 5,000 Units  5,000 Units Subcutaneous Q8H Stoioff, Scott C, MD   5,000 Units at 06/26/22 1610   magnesium sulfate IVPB 2 g 50 mL  2 g Intravenous Once Ronnald Ramp, RPH       melatonin tablet 10 mg  10 mg Oral QHS Stoioff, Verna Czech, MD  10 mg at 06/25/22 2149   metoprolol tartrate (LOPRESSOR) injection 5 mg  5 mg Intravenous Q5 min PRN Manuela Schwartz, NP   5 mg at 06/26/22 0435   metoprolol tartrate (LOPRESSOR) tablet 25 mg  25 mg Oral BID Antonieta Iba, MD       multivitamin with minerals tablet 1 tablet  1 tablet Oral Daily Riki Altes, MD   1 tablet at 06/25/22 0934   mupirocin ointment (BACTROBAN) 2 % 1 Application  1 Application Nasal BID Riki Altes, MD   1 Application at 06/25/22 2149   ondansetron (ZOFRAN) tablet 4 mg  4 mg Oral Q8H PRN Stoioff, Verna Czech, MD       oxyCODONE (Oxy IR/ROXICODONE) immediate release tablet 10 mg  10 mg Oral Q8H PRN Stoioff, Scott C, MD       polyethylene glycol (MIRALAX / GLYCOLAX) packet 17 g  17 g Oral Daily PRN Stoioff, Scott C, MD       potassium chloride SA (KLOR-CON M) CR tablet 40 mEq  40 mEq Oral Once Ronnald Ramp, RPH       senna-docusate (Senokot-S) tablet 1 tablet  1 tablet Oral QHS PRN Stoioff, Scott C, MD       senna-docusate (Senokot-S) tablet 1 tablet   1 tablet Oral Daily Stoioff, Scott C, MD   1 tablet at 06/25/22 0934     Objective: Vital signs in last 24 hours: Temp:  [97 F (36.1 C)-97.9 F (36.6 C)] 97.9 F (36.6 C) (05/09 0527) Pulse Rate:  [62-150] 136 (05/09 0336) Resp:  [13-26] 20 (05/09 0527) BP: (91-147)/(67-107) 137/79 (05/09 0527) SpO2:  [84 %-100 %] 100 % (05/09 0527) Weight:  [104.2 kg] 104.2 kg (05/09 0603)  Intake/Output from previous day: 05/08 0701 - 05/09 0700 In: 320 [P.O.:120; I.V.:200] Out: 750 [Urine:750] Intake/Output this shift: No intake/output data recorded.   Physical Exam Constitutional:      General: She is not in acute distress.    Appearance: She is not ill-appearing, toxic-appearing or diaphoretic.     Comments: She is somnolent but arousable.  She was unable to answer my questions.  HENT:     Head: Normocephalic and atraumatic.     Nose: Nose normal.  Eyes:     Extraocular Movements: Extraocular movements intact.     Pupils: Pupils are equal, round, and reactive to light.  Pulmonary:     Effort: Pulmonary effort is normal.  Skin:    General: Skin is warm and dry.  Neurological:     Mental Status: She is disoriented.     Lab Results:  Recent Labs    06/25/22 0622 06/26/22 0540  WBC 8.7 10.9*  HGB 9.5* 9.2*  HCT 28.7* 27.2*  PLT 72* 114*   BMET Recent Labs    06/25/22 0622 06/26/22 0540  NA 141 137  K 3.5 3.2*  CL 109 105  CO2 23 22  GLUCOSE 94 102*  BUN 63* 46*  CREATININE 1.62* 1.22*  CALCIUM 8.2* 7.9*   PT/INR No results for input(s): "LABPROT", "INR" in the last 72 hours. ABG No results for input(s): "PHART", "HCO3" in the last 72 hours.  Invalid input(s): "PCO2", "PO2"  Studies/Results: DG C-Arm 1-60 Min-No Report  Result Date: 06/25/2022 Fluoroscopy was utilized by the requesting physician.  No radiographic interpretation.   US Abdomen Limited RUQ (LIVER/GB)  Result Date: 06/24/2022 CLINICAL DATA:  Abdominal pain EXAM: ULTRASOUND ABDOMEN LIMITED  RIGHT UPPER QUADRANT COMPARISON:  None Available.  FINDINGS: Gallbladder: Dilated gallbladder. Borderline wall thickening. No shadowing stones. There is some sludge. There is some adjacent fluid as well to the gallbladder. Common bile duct: Diameter: 6 mm Liver: Nodular contours of the liver with mild ascites. Right-sided pleural effusion. Portal vein is patent on color Doppler imaging with normal direction of blood flow towards the liver. Other: None. IMPRESSION: Distended gallbladder with sludge.  No stones. There is some wall thickening and adjacent fluid although there is a nodular appearance of the liver with the ascites. Please correlate for chronic liver disease. Overall if there is further concern of acalculous cholecystitis with the dilatation and wall thickening, HIDA scan may be useful as the next step in the workup Electronically Signed   By: Karen Kays M.D.   On: 06/24/2022 12:05     Assessment and Plan: 77 year old female with past medical history of dementia, hypertension, GERD followed by Dr. Richardo Hanks for nephrolithiasis who underwent a left ureteral stent placement yesterday with Dr. Lonna Cobb for a 2 cm left renal pelvic stone with moderate hydronephrosis and she also has a nonobstructing 14 mm lower pole calculus.  She continues to be afebrile and improving clinically.  Urine culture is still pending.  She will need to be on culture appropriate antibiotics for 7 to 10 days.  She will definitely need outpatient management for definitive stone treatment in the future.  We will schedule follow-up appointment in the office to discuss this further and hopefully a family member will be able to attend that appointment.    LOS: 5 days    [facility] 06/26/2022

## 2022-06-26 NOTE — Plan of Care (Signed)

## 2022-06-26 NOTE — Progress Notes (Signed)
Cardiology Progress Note   Patient Name: Alison Price Date of Encounter: 06/26/2022  Primary Cardiologist: Julien Nordmann, MD  Subjective   Notified by RN that pt w/ increase WOB this AM.  Has already received a dose of IV lasix w/ good response thus far.  Currently in no acute distress.  Confused - intermittently asking to go home and then not to go home.  Intermittent afib overnight, currently sinus.  Inpatient Medications    Scheduled Meds:  amiodarone  400 mg Oral BID   cholecalciferol  2,000 Units Oral Daily   donepezil  10 mg Oral QHS   escitalopram  5 mg Oral Daily   feeding supplement  237 mL Oral BID BM   ferrous sulfate  324 mg Oral Q breakfast   heparin  5,000 Units Subcutaneous Q8H   melatonin  10 mg Oral QHS   metoprolol tartrate  25 mg Oral BID   multivitamin with minerals  1 tablet Oral Daily   senna-docusate  1 tablet Oral Daily   Continuous Infusions:  sodium chloride 10 mL/hr at 06/22/22 0900   cefTRIAXone (ROCEPHIN)  IV 2 g (06/26/22 0922)   PRN Meds: acetaminophen, metoprolol tartrate, ondansetron, oxyCODONE, polyethylene glycol, senna-docusate   Vital Signs    Vitals:   06/26/22 0448 06/26/22 0527 06/26/22 0603 06/26/22 0807  BP: 118/70 137/79  (!) 137/92  Pulse:    69  Resp: (!) 21 20  17   Temp:  97.9 F (36.6 C)  97.7 F (36.5 C)  TempSrc:      SpO2: 96% 100%  100%  Weight:   104.2 kg   Height:        Intake/Output Summary (Last 24 hours) at 06/26/2022 1126 Last data filed at 06/26/2022 0020 Gross per 24 hour  Intake 120 ml  Output 750 ml  Net -630 ml   Filed Weights   06/22/22 0700 06/24/22 0500 06/26/22 0603  Weight: 58.4 kg 59 kg 104.2 kg    Physical Exam   GEN: Well nourished, well developed, in no acute distress.  HEENT: Grossly normal.  Neck: Supple, no JVD, carotid bruits, or masses. Cardiac: RRR, no murmurs, rubs, or gallops. No clubbing, cyanosis, edema.  Radials 2+, DP/PT 2+ and equal bilaterally.  Respiratory:   Respirations regular and unlabored, diminished breath sounds bilat. GI: Soft, nontender, nondistended, BS + x 4. MS: no deformity or atrophy. Skin: warm and dry, no rash. Neuro:  Strength and sensation are intact. Psych: AAOx3.  Normal affect.  Labs    Chemistry Recent Labs  Lab 06/21/22 1601 06/21/22 2301 06/24/22 0421 06/25/22 0622 06/26/22 0540  NA 141   < > 139 141 137  K 2.9*   < > 3.5 3.5 3.2*  CL 104   < > 107 109 105  CO2 22   < > 21* 23 22  GLUCOSE 83   < > 92 94 102*  BUN 64*   < > 68* 63* 46*  CREATININE 3.07*   < > 1.93* 1.62* 1.22*  CALCIUM 8.3*   < > 8.2* 8.2* 7.9*  PROT 6.8  --   --   --   --   ALBUMIN 3.6  --   --   --   --   AST 46*  --   --   --   --   ALT 12  --   --   --   --   ALKPHOS 134*  --   --   --   --  BILITOT 1.3*  --   --   --   --   GFRNONAA 15*   < > 27* 33* 46*  ANIONGAP 15   < > 11 9 10    < > = values in this interval not displayed.     Hematology Recent Labs  Lab 06/24/22 0421 06/25/22 0622 06/26/22 0540  WBC 11.6* 8.7 10.9*  RBC 2.81* 3.06* 2.89*  HGB 8.9* 9.5* 9.2*  HCT 25.9* 28.7* 27.2*  MCV 92.2 93.8 94.1  MCH 31.7 31.0 31.8  MCHC 34.4 33.1 33.8  RDW 14.4 14.5 14.3  PLT 40* 72* 114*    Cardiac Enzymes  Recent Labs  Lab 06/21/22 2301 06/21/22 2358  TROPONINIHS 85* 91*      BNP    Component Value Date/Time   BNP 767 (H) 04/19/2011 0444   DDimer  Recent Labs  Lab 06/21/22 2301  DDIMER 8.22*     Lipids  Lab Results  Component Value Date   CHOL 240 (H) 06/11/2018   HDL 79 06/11/2018   LDLCALC 150 (H) 06/11/2018   TRIG 53 06/11/2018   CHOLHDL 3.0 06/11/2018    HbA1c  Lab Results  Component Value Date   HGBA1C 5.0 06/29/2019    Radiology    DG C-Arm 1-60 Min-No Report  Result Date: 06/25/2022 Fluoroscopy was utilized by the requesting physician.  No radiographic interpretation.   US Abdomen Limited RUQ (LIVER/GB)  Result Date: 06/24/2022 CLINICAL DATA:  Abdominal pain EXAM: ULTRASOUND  ABDOMEN LIMITED RIGHT UPPER QUADRANT COMPARISON:  None Available. FINDINGS: Gallbladder: Dilated gallbladder. Borderline wall thickening. No shadowing stones. There is some sludge. There is some adjacent fluid as well to the gallbladder. Common bile duct: Diameter: 6 mm Liver: Nodular contours of the liver with mild ascites. Right-sided pleural effusion. Portal vein is patent on color Doppler imaging with normal direction of blood flow towards the liver. Other: None. IMPRESSION: Distended gallbladder with sludge.  No stones. There is some wall thickening and adjacent fluid although there is a nodular appearance of the liver with the ascites. Please correlate for chronic liver disease. Overall if there is further concern of acalculous cholecystitis with the dilatation and wall thickening, HIDA scan may be useful as the next step in the workup Electronically Signed   By: Karen Kays M.D.   On: 06/24/2022 12:05   CT RENAL STONE STUDY  Result Date: 06/23/2022 CLINICAL DATA:  Abdominal/flank pain, stone suspected EXAM: CT ABDOMEN AND PELVIS WITHOUT CONTRAST TECHNIQUE: Multidetector CT imaging of the abdomen and pelvis was performed following the standard protocol without IV contrast. RADIATION DOSE REDUCTION: This exam was performed according to the departmental dose-optimization program which includes automated exposure control, adjustment of the mA and/or kV according to patient size and/or use of iterative reconstruction technique. COMPARISON:  Contrast enhanced CT 04/13/2021 FINDINGS: Lower chest: Small to moderate bilateral pleural effusions. Associated bibasilar airspace disease. Additional airspace disease in the lingula and right middle lobe is chronic when compared with prior abdominal CT. There are coronary artery calcifications. Mitral annulus calcifications. The heart is enlarged. Hepatobiliary: Motion artifact through the liver, allowing for this, no evidence of focal liver lesion. The gallbladder is  distended. Assessment for pericholecystic inflammation is limited due to motion through this region. The tiny gallstones on prior exam are not definitively seen. The common bile duct and biliary tree are difficult to define. There is no obvious biliary dilatation. Pancreas: Parenchymal atrophy. Motion artifact through the proximal pancreas. There is no pancreatic inflammation. Spleen:  Normal in size without focal abnormality. Adrenals/Urinary Tract: No adrenal nodule. There is a 2 cm stone in the left renal pelvis extending into the proximal ureter with moderate hydronephrosis. 14 mm no obstructing stone in the lower pole of the left kidney and 3 mm nonobstructing stone in the upper pole of the left kidney. No significant left perinephric edema. Right renal cortical atrophy. Calcification in the posterior mid upper kidney likely represents a combination of parenchymal calcification a nonobstructing stone, linear 11 mm calcification may represent a stone fragment in the renal collecting system. Previous proximal right ureteral stone is no longer seen. There is mild right perirenal scarring. No definite ureteral calculi. Nondistended urinary bladder. No bladder stone. Stomach/Bowel: No bowel obstruction or inflammation. The appendix not confidently visualized, no appendicitis. Small to moderate volume of stool throughout the colon. Occasional colonic diverticula, no diverticulitis. Vascular/Lymphatic: Moderate aortic atherosclerosis. Aortic tortuosity but no aneurysm. Assessment for adenopathy is limited in the absence of contrast and patient motion. No obvious bulky enlarged lymph nodes. Reproductive: Hysterectomy. Small amount of air in the vagina is nonspecific. No adnexal mass. Other: Small volume of free fluid in the pelvis. Trace free fluid in the right upper quadrant. There is mild body wall subcutaneous edema. No free intra-abdominal air. Musculoskeletal: Interbody spacer at L4-L5. Mild compression deformities  versus Schmorl's nodes within superior endplate of T11, L1 and L3, chronic. The bones are subjectively under mineralized. Left proximal femur surgical hardware is partially included. No acute osseous findings. IMPRESSION: 1. Obstructing 2 cm stone in the left renal pelvis extending into the proximal ureter with moderate hydronephrosis. Additional nonobstructing stones in the left kidney. 2. Mild right renal scarring. Calcifications in the right kidney may be combination of parenchymal calcifications and stone fragments. 3. Hydropic gallbladder. Assessment for pericholecystic inflammation is limited due to motion through this region. The tiny gallstones on prior exam are not definitively seen. If there is clinical concern for acute gallbladder pathology, recommend right upper quadrant ultrasound. 4. Small to moderate bilateral pleural effusions with adjacent airspace disease, likely atelectasis. Additional airspace disease in the lingula and right middle lobe is chronic when compared with prior abdominal CT. 5. Small volume of free fluid in the pelvis and right upper quadrant, nonspecific. Mild body wall edema. Aortic Atherosclerosis (ICD10-I70.0). Electronically Signed   By: Narda Rutherford M.D.   On: 06/23/2022 18:18    Telemetry    Predominantly sinus rhythm w/ intermittent Afib into the 120's overnight - Personally Reviewed  Cardiac Studies   5/9 - echo pending  Patient Profile     77 year old woman with history of dementia, hypertension, GERD, kidney stones, falls presenting to the hospital with sepsis, mental status changes, AKI/renal stone/hydronephrosis, hypotension, tachycardia and PAF.   Assessment & Plan    1.  PAF:  in setting of sepsis, AKI, nephrolithiasis/hydronephrosis s/p ureteral stenting, and dementia.  Now on PO amio.  Intermittent Afib overnight - sinus this AM.  Cont current PO amio load (400 bid) and metoprolol.  Poor candidate for OAC due to high fall risk/debility  (CHA2DS2VASc = 5).  Would reconsider if pt discharged to SNF.  2.  Acute CHF:  Pt w/ increased WOB this AM - has responded well to IV lasix.  Diminished breat sounds on exam and w/o acute distress.  Does not appear to be markedly volume overloaded.  Suspect PAF w/ tachy overnight playing a role.  In setting of AKI/sepsis, she has had significant fluid resuscitation - + 6.4L for admission.  Echo pending this AM.  Will re-dose lasix this afternoon.    3.  Hypokalemia: supplementation ordered.  4.  AKI/L hydronephrosis:  s/p stent placement.  Creat cont to improve @ 1.22.  5.  Normocytic anemia:  stable.  Signed, Nicolasa Ducking, NP  06/26/2022, 11:26 AM    For questions or updates, please contact   Please consult [email or web address] for contact info under Cardiology/STEMI.

## 2022-06-26 NOTE — Telephone Encounter (Signed)
She is still in the hospital, but she will need an appointment with Dr. Richardo Hanks upon discharge to discuss definitive stone management.  It would be preferable if she would bring a family member as she does suffer with dementia.

## 2022-06-26 NOTE — Progress Notes (Signed)
Mobility Specialist - Progress Note   06/26/22 1437  Mobility  Activity Transferred from chair to bed  Level of Assistance Minimal assist, patient does 75% or more  Assistive Device Front wheel walker  Distance Ambulated (ft) 2 ft  Activity Response Tolerated well  $Mobility charge 1 Mobility  Mobility Specialist Start Time (ACUTE ONLY) 1422  Mobility Specialist Stop Time (ACUTE ONLY) 1434  Mobility Specialist Time Calculation (min) (ACUTE ONLY) 12 min   Pt sitting in the recliner upon entry, [place] doffed-- unsure how long. Pt STS to RW MinA and transferred to bed. Pt left supine on 4L [place] with alarm set and needs within reach. RN made aware of doffed [place].   Zetta Bills Mobility Specialist 06/26/22 2:44 PM

## 2022-06-26 NOTE — Progress Notes (Signed)
       CROSS COVER NOTE  NAME: AVIANNAH BOY MRN: [id number] DOB : 11/30/1945    HPI/Events of Note   Report: multiple episodes of paroxysmal non sustained afib with RVR in 140's then became sustained around   On review of chart:patient admitted with sepsis from e coli UTI and bacteremia. She developed new onset paroxysmal atrial fib with RVR  i managed with amiodarone infusion than changed to oral per cardiology whom is managing this    Assessment and  Interventions   Assessment: Per nursing staff, cardiology informed of sustatined a fib and awaiting return call BP stable Plan: Metoprolol 5 mg every x 2 doses for sustained rate above 125 heart rate improved in 60s with stable pressure       Donnie Mesa NP [facility]

## 2022-06-26 NOTE — TOC Progression Note (Signed)
Transition of Care [facility]) - Progression Note    Patient Details  Name: Alison Price MRN: [id number] Date of Birth: 1945/12/24  Transition of Care [facility]) CM/SW Contact  Allena Katz, LCSW Phone Number: 06/26/2022, 1:17 PM  Clinical Narrative:   [name] started for [facility] health and rehab for 5/10.         Expected Discharge Plan and Services                                               Social Determinants of Health (SDOH) Interventions SDOH Screenings   Tobacco Use: Medium Risk (06/26/2022)    Readmission Risk Interventions     No data to display

## 2022-06-26 NOTE — Progress Notes (Signed)
Mobility Specialist - Progress Note  During mobility: SpO2 96 Post-mobility:SPO2 97   06/26/22 1202  Therapy Vitals  Temp 98.2 F (36.8 C)  Pulse Rate 62  Resp 16  BP 123/79  Patient Position (if appropriate) Sitting  Oxygen Therapy  SpO2 100 %  O2 Device Room Air  Mobility  Activity Stood at bedside;Transferred from bed to chair;Ambulated with assistance in hallway  Level of Assistance Minimal assist, patient does 75% or more  Assistive Device Front wheel walker  Distance Ambulated (ft) 2 ft  Activity Response Tolerated well  $Mobility charge 1 Mobility  Mobility Specialist Start Time (ACUTE ONLY) 1135  Mobility Specialist Stop Time (ACUTE ONLY) 1202  Mobility Specialist Time Calculation (min) (ACUTE ONLY) 27 min   Pt in simi fowler position upon entry with [place] doffed, utilizing RA-- unsure how long. Pt able to state first and last name, as well as DOB. Pt agreeable to transfer to recliner, however states "I'm ready to go home" multiple times throughout the transfer. Pt completed bed mob MinA for trunk support and to fully bring LLE to the ground. Pt STS to RW ModA +1, requiring tactile cuing for hand placement of the RW. Pt completed a amb transfer, taking lateral and backwards steps with Min Vcs for sequencing and movement of the walker. Pt left seated in the recliner with alarm set and needs within reach.   Alison Price Mobility Specialist 06/26/22 12:43 PM

## 2022-06-26 NOTE — Progress Notes (Signed)
*  PRELIMINARY RESULTS* Echocardiogram 2D Echocardiogram has been performed.  Alison Price 06/26/2022, 10:04 AM

## 2022-06-27 ENCOUNTER — Other Ambulatory Visit (HOSPITAL_COMMUNITY): Payer: Self-pay

## 2022-06-27 ENCOUNTER — Encounter: Payer: Self-pay | Admitting: Oncology

## 2022-06-27 ENCOUNTER — Telehealth (HOSPITAL_COMMUNITY): Payer: Self-pay | Admitting: Pharmacy Technician

## 2022-06-27 DIAGNOSIS — E872 Acidosis, unspecified: Secondary | ICD-10-CM | POA: Diagnosis not present

## 2022-06-27 DIAGNOSIS — R6520 Severe sepsis without septic shock: Secondary | ICD-10-CM | POA: Diagnosis not present

## 2022-06-27 DIAGNOSIS — A419 Sepsis, unspecified organism: Secondary | ICD-10-CM | POA: Diagnosis not present

## 2022-06-27 LAB — CBC
HCT: 31.3 % — ABNORMAL LOW (ref 36.0–46.0)
Hemoglobin: 10.4 g/dL — ABNORMAL LOW (ref 12.0–15.0)
MCH: 31 pg (ref 26.0–34.0)
MCHC: 33.2 g/dL (ref 30.0–36.0)
MCV: 93.2 fL (ref 80.0–100.0)
Platelets: 153 10*3/uL (ref 150–400)
RBC: 3.36 MIL/uL — ABNORMAL LOW (ref 3.87–5.11)
RDW: 14.2 % (ref 11.5–15.5)
WBC: 9.9 10*3/uL (ref 4.0–10.5)
nRBC: 0 % (ref 0.0–0.2)

## 2022-06-27 LAB — URINE CULTURE: Culture: 10000 — AB

## 2022-06-27 LAB — BASIC METABOLIC PANEL
Anion gap: 10 (ref 5–15)
BUN: 35 mg/dL — ABNORMAL HIGH (ref 8–23)
CO2: 29 mmol/L (ref 22–32)
Calcium: 8.2 mg/dL — ABNORMAL LOW (ref 8.9–10.3)
Chloride: 103 mmol/L (ref 98–111)
Creatinine, Ser: 1.21 mg/dL — ABNORMAL HIGH (ref 0.44–1.00)
GFR, Estimated: 46 mL/min — ABNORMAL LOW (ref >60)
Glucose, Bld: 110 mg/dL — ABNORMAL HIGH (ref 70–99)
Potassium: 3.3 mmol/L — ABNORMAL LOW (ref 3.5–5.1)
Sodium: 142 mmol/L (ref 135–145)

## 2022-06-27 LAB — MAGNESIUM: Magnesium: 1.3 mg/dL — ABNORMAL LOW (ref 1.7–2.4)

## 2022-06-27 LAB — PHOSPHORUS: Phosphorus: 3.5 mg/dL (ref 2.5–4.6)

## 2022-06-27 LAB — PROCALCITONIN: Procalcitonin: 6.43 ng/mL

## 2022-06-27 MED ORDER — METOPROLOL SUCCINATE ER 50 MG PO TB24
50.0000 mg | ORAL_TABLET | Freq: Every day | ORAL | Status: DC
  Administered 2022-06-29 – 2022-06-30 (×2): 50 mg via ORAL
  Filled 2022-06-27 (×3): qty 1

## 2022-06-27 MED ORDER — MAGNESIUM SULFATE 2 GM/50ML IV SOLN
2.0000 g | Freq: Once | INTRAVENOUS | Status: AC
  Administered 2022-06-27: 2 g via INTRAVENOUS
  Filled 2022-06-27: qty 50

## 2022-06-27 MED ORDER — METOPROLOL TARTRATE 25 MG PO TABS
25.0000 mg | ORAL_TABLET | Freq: Two times a day (BID) | ORAL | Status: AC
  Administered 2022-06-27: 25 mg via ORAL
  Filled 2022-06-27: qty 1

## 2022-06-27 MED ORDER — SACUBITRIL-VALSARTAN 24-26 MG PO TABS
1.0000 | ORAL_TABLET | Freq: Two times a day (BID) | ORAL | Status: DC
  Administered 2022-06-27 – 2022-06-30 (×7): 1 via ORAL
  Filled 2022-06-27 (×7): qty 1

## 2022-06-27 MED ORDER — ENOXAPARIN SODIUM 60 MG/0.6ML IJ SOSY: 50.0000 mg | PREFILLED_SYRINGE | INTRAMUSCULAR | Status: DC

## 2022-06-27 MED ORDER — APIXABAN 5 MG PO TABS
5.0000 mg | ORAL_TABLET | Freq: Two times a day (BID) | ORAL | Status: DC
  Administered 2022-06-27 – 2022-06-30 (×7): 5 mg via ORAL
  Filled 2022-06-27 (×7): qty 1

## 2022-06-27 MED ORDER — POTASSIUM CHLORIDE 20 MEQ PO PACK
40.0000 meq | PACK | Freq: Once | ORAL | Status: AC
  Administered 2022-06-27: 40 meq via ORAL
  Filled 2022-06-27: qty 2

## 2022-06-27 MED ORDER — CEFADROXIL 500 MG PO CAPS
1000.0000 mg | ORAL_CAPSULE | Freq: Two times a day (BID) | ORAL | Status: DC
  Administered 2022-06-28 – 2022-06-30 (×5): 1000 mg via ORAL
  Filled 2022-06-27 (×5): qty 2

## 2022-06-27 MED ORDER — AMIODARONE HCL 200 MG PO TABS: 200.0000 mg | ORAL_TABLET | Freq: Two times a day (BID) | ORAL | Status: DC

## 2022-06-27 NOTE — Progress Notes (Signed)
PROGRESS NOTE    Alison Price  ZOX:[id number] DOB: October 25, 1945 DOA: 06/21/2022 PCP: Alison Shaggy, MD   Brief Narrative:  This 77 yrs old  female with PMH significant of dementia, hypertension, GERD, arthritis, Stevens-Johnson's syndrome, recurrent falls with recent right patella and left wrist fracture who presented to the ED with chief complaints of altered mental status.  She was found to have severe sepsis secondary to UTI.  She was started on IV antibiotics.  CT renal study showed obstructing stone causing hydronephrosis. Urology is consulted,  Patient underwent cystoscopy with left ureteral stent placement.  Post procedure patient developed A-fib with RVR and was given a dose of metoprolol and she converted to normal sinus rhythm.  Cardiology is consulted. Echo showed LVEF 25-30%. Not a good candidate for ischemic evaluation given age and multiple co morbid conditions.   Assessment & Plan:   Principal Problem:   Severe sepsis with lactic acidosis (HCC) Active Problems:   Hydronephrosis with urinary obstruction due to renal calculus   Nephrolithiasis   AKI (acute kidney injury) (HCC)   Lower urinary tract infectious disease   Altered mental status   Lactic acidosis   PAF (paroxysmal atrial fibrillation) (HCC)   Dilated cardiomyopathy (HCC)  Severe sepsis /shock due to E. coli UTI: E. coli bacteremia / pneumonia: H/o Ureteral stone in 2022 (had cystoscopy with stent placement and h/o Lithotripsy): Patient presented with septic shock requiring IV fluids / vasopressors. Blood culture positive for E. Coli. Initiated on IV Rocephin , changed to Duricef  1g bid for 3 days. She remains afebrile.  Lactic acid normalized with IV hydration. Procalcitonin trended down 150 > 33.99 > 6.43. Patient currently on room air. No cough, Sepsis physiology improving.  Leukocytosis: Presented with WBC 4.8 trended up to 19.8--22K. Likely secondary to UTI, continue antibiotics. Leucocytosis  resolved.   Paroxysmal  New onset A. fib in the setting of sepsis >  Now resolved She was started on IV amiodarone drip. Heart rate now ranges between 57-70 Post procedure patient went into A-fib again with RVR , given a dose of metoprolol  She converted to normal sinus rhythm. She is not a candidate for anticoagulation given recurrent falls. Continue amiodarone 400 twice daily and metoprolol 25 mg bid.  Dilated cardiomyopathy: Echo shows LVEF 25 to 30%. Not a good candidate for ischemic workup given age and multiple comorbid conditions. Neurology recommended continue IV Lasix twice daily. Consider ARB if renal functions improves.  AKI on chronic kidney disease stage IV: Baseline serum creatinine( 0.9--1.22 in sept 2022).  Continue IV fluid resuscitation. Avoid nephrotoxic medications.  Creatinine is improving 3.07 > 2.70 > 2.68 > 2.25 > 1.93>1.62 >1.22 >1.21  Left hydronephrosis : CT renal study shows obstructing stone causing hydronephrosis on left side Urology is consulted.  Patient underwent cystoscopy with left ureteral stent placement. Continue antibiotics and urology follow-up. Urology recommended 7 to 10 days of culture specific antibiotics and outpatient follow-up.   Hypomagnesimia Replaced. Continue to monitor   GERD: Pantoprazole 40 mg daily.   Generalized weakness with deconditioning: PT and OT evaluation.   Dementia without behavioral problems: Sister Alison Price who is next of kin.  She reports patient has dementia. Patient lives with Alison Price at home. She is fairly independent. TOC for discharge planning   Hypokalemia: Replaced. Continue to monitor  DVT prophylaxis: Lovenox Code Status: DNR Family Communication: No family at bedside Disposition Plan:  Status is: Inpatient Remains inpatient appropriate because: Admitted for severe sepsis secondary to E.  coli bacteremia, UTI.  CT renal study shows kidney stone causing hydronephrosis.  Urology is consulted.   Continued on IV antibiotics.  Underwent cystoscopy with left ureteral stent placement.  Consultants:  Urology  Procedures: CT renal study.  Antimicrobials:  Anti-infectives (From admission, onward)    Start     Dose/Rate Route Frequency Ordered Stop   06/28/22 1000  cefadroxil (DURICEF) capsule 1,000 mg        1,000 mg Oral 2 times daily 06/27/22 0847 07/01/22 0959   06/22/22 1000  azithromycin (ZITHROMAX) 500 mg in sodium chloride 0.9 % 250 mL IVPB  Status:  Discontinued        500 mg 250 mL/hr over 60 Minutes Intravenous Every 24 hours 06/21/22 2251 06/22/22 0913   06/22/22 0800  cefTRIAXone (ROCEPHIN) 2 g in sodium chloride 0.9 % 100 mL IVPB        2 g 200 mL/hr over 30 Minutes Intravenous Every 24 hours 06/21/22 2251 06/27/22 1013   06/21/22 1700  ceFEPIme (MAXIPIME) 2 g in sodium chloride 0.9 % 100 mL IVPB        2 g 200 mL/hr over 30 Minutes Intravenous  Once 06/21/22 1652 06/21/22 1713   06/21/22 1700  vancomycin (VANCOCIN) IVPB 1000 mg/200 mL premix        1,000 mg 200 mL/hr over 60 Minutes Intravenous  Once 06/21/22 1652 06/21/22 1814       Subjective: Patient was seen and examined at bedside.  Overnight events noted.   Patient has baseline dementia. She appears very deconditioned and weak. She follows commands and she is oriented to self only. Her heart rate remains in normal sinus rhythm.  Objective: Vitals:   06/26/22 2014 06/26/22 2348 06/27/22 0342 06/27/22 0908  BP: (!) 143/85 126/75 125/72 133/77  Pulse: 75 68 74 68  Resp: 20 20 18 14   Temp: 98.1 F (36.7 C) 97.7 F (36.5 C) 98.2 F (36.8 C) 97.6 F (36.4 C)  TempSrc:    Oral  SpO2: 95% 98% 96% 97%  Weight:      Height:        Intake/Output Summary (Last 24 hours) at 06/27/2022 1107 Last data filed at 06/27/2022 0501 Gross per 24 hour  Intake 560 ml  Output 3150 ml  Net -2590 ml   Filed Weights   06/22/22 0700 06/24/22 0500 06/26/22 0603  Weight: 58.4 kg 59 kg 104.2 kg     Examination:  General exam: Appears comfortable, deconditioned, weak, not in any acute distress. Respiratory system: CTA bilaterally, respiratory effort normal.  RR 14. Cardiovascular system: S1 & S2 heard, RRR. No JVD, murmurs, rubs, gallops or clicks.  Gastrointestinal system: Abdomen is soft, non tender, non distended, BS+ Central nervous system: Alert and oriented x 1. No focal neurological deficits. Extremities: No edema, no cyanosis, no clubbing. Skin: No rashes, lesions or ulcers Psychiatry: Mood & affect appropriate.     Data Reviewed: I have personally reviewed following labs and imaging studies  CBC: Recent Labs  Lab 06/21/22 1601 06/22/22 0502 06/23/22 0549 06/24/22 0421 06/25/22 0622 06/26/22 0540 06/27/22 0514  WBC 4.9   < > 22.2* 11.6* 8.7 10.9* 9.9  NEUTROABS 3.5  --   --   --   --   --   --   HGB 12.1   < > 9.7* 8.9* 9.5* 9.2* 10.4*  HCT 35.7*   < > 29.4* 25.9* 28.7* 27.2* 31.3*  MCV 93.2   < > 93.3 92.2 93.8 94.1 93.2  PLT 43*   < > 33* 40* 72* 114* 153   < > = values in this interval not displayed.   Basic Metabolic Panel: Recent Labs  Lab 06/22/22 0502 06/23/22 0835 06/24/22 0421 06/25/22 0622 06/26/22 0540 06/27/22 0514  NA 136 139 139 141 137 142  K 4.1 4.1 3.5 3.5 3.2* 3.3*  CL 104 106 107 109 105 103  CO2 20* 19* 21* 23 22 29   GLUCOSE 93 81 92 94 102* 110*  BUN 64* 69* 68* 63* 46* 35*  CREATININE 2.68* 2.25* 1.93* 1.62* 1.22* 1.21*  CALCIUM 8.0* 8.0* 8.2* 8.2* 7.9* 8.2*  MG 1.2*  --  2.1 1.9 1.6* 1.3*  PHOS 3.9  --   --  3.8 3.7 3.5   GFR: Estimated Creatinine Clearance: 51.7 mL/min (A) (by C-G formula based on SCr of 1.21 mg/dL (H)). Liver Function Tests: Recent Labs  Lab 06/21/22 1601  AST 46*  ALT 12  ALKPHOS 134*  BILITOT 1.3*  PROT 6.8  ALBUMIN 3.6   No results for input(s): "LIPASE", "AMYLASE" in the last 168 hours. No results for input(s): "AMMONIA" in the last 168 hours. Coagulation Profile: No results for  input(s): "INR", "PROTIME" in the last 168 hours. Cardiac Enzymes: No results for input(s): "CKTOTAL", "CKMB", "CKMBINDEX", "TROPONINI" in the last 168 hours. BNP (last 3 results) No results for input(s): "PROBNP" in the last 8760 hours. HbA1C: No results for input(s): "HGBA1C" in the last 72 hours. CBG: Recent Labs  Lab 06/21/22 2243  GLUCAP 92   Lipid Profile: No results for input(s): "CHOL", "HDL", "LDLCALC", "TRIG", "CHOLHDL", "LDLDIRECT" in the last 72 hours. Thyroid Function Tests: No results for input(s): "TSH", "T4TOTAL", "FREET4", "T3FREE", "THYROIDAB" in the last 72 hours.  Anemia Panel: No results for input(s): "VITAMINB12", "FOLATE", "FERRITIN", "TIBC", "IRON", "RETICCTPCT" in the last 72 hours. Sepsis Labs: Recent Labs  Lab 06/21/22 1601 06/21/22 1947 06/21/22 2358 06/25/22 0622 06/27/22 0819  PROCALCITON >150.00  --   --  33.99 6.43  LATICACIDVEN 4.4* 5.5* 2.7* 1.2  --     Recent Results (from the past 240 hour(s))  Blood Culture (routine x 2)     Status: Abnormal   Collection Time: 06/21/22  4:00 PM   Specimen: BLOOD RIGHT FOREARM  Result Value Ref Range Status   Specimen Description   Final    BLOOD RIGHT FOREARM Performed at [facility], [address], [place], [place] [address]    Special Requests   Final    BOTTLES DRAWN AEROBIC AND ANAEROBIC Blood Culture adequate volume Performed at [facility], [address] [place], [place] [address]    Culture  Setup Time   Final    IN BOTH AEROBIC AND ANAEROBIC BOTTLES GRAM NEGATIVE RODS CRITICAL RESULT CALLED TO, READ BACK BY AND VERIFIED WITHDawayne Cirri @ 0981 06/22/22 LFD Performed at [facility], [address], [place], [place] [address]    Culture ESCHERICHIA COLI (A)  Final   Report Status 06/24/2022 FINAL  Final   Organism ID, Bacteria ESCHERICHIA COLI  Final      Susceptibility   Escherichia coli - MIC*    AMPICILLIN 8 SENSITIVE Sensitive     CEFEPIME <=0.12  SENSITIVE Sensitive     CEFTAZIDIME <=1 SENSITIVE Sensitive     CEFTRIAXONE <=0.25 SENSITIVE Sensitive     CIPROFLOXACIN <=0.25 SENSITIVE Sensitive     GENTAMICIN <=1 SENSITIVE Sensitive     IMIPENEM <=0.25 SENSITIVE Sensitive  TRIMETH/SULFA <=20 SENSITIVE Sensitive     AMPICILLIN/SULBACTAM 4 SENSITIVE Sensitive     PIP/TAZO <=4 SENSITIVE Sensitive     * ESCHERICHIA COLI  Blood Culture ID Panel (Reflexed)     Status: Abnormal   Collection Time: 06/21/22  4:00 PM  Result Value Ref Range Status   Enterococcus faecalis NOT DETECTED NOT DETECTED Final   Enterococcus Faecium NOT DETECTED NOT DETECTED Final   Listeria monocytogenes NOT DETECTED NOT DETECTED Final   Staphylococcus species NOT DETECTED NOT DETECTED Final   Staphylococcus aureus (BCID) NOT DETECTED NOT DETECTED Final   Staphylococcus epidermidis NOT DETECTED NOT DETECTED Final   Staphylococcus lugdunensis NOT DETECTED NOT DETECTED Final   Streptococcus species NOT DETECTED NOT DETECTED Final   Streptococcus agalactiae NOT DETECTED NOT DETECTED Final   Streptococcus pneumoniae NOT DETECTED NOT DETECTED Final   Streptococcus pyogenes NOT DETECTED NOT DETECTED Final   A.calcoaceticus-baumannii NOT DETECTED NOT DETECTED Final   Bacteroides fragilis NOT DETECTED NOT DETECTED Final   Enterobacterales DETECTED (A) NOT DETECTED Final    Comment: Enterobacterales represent a large order of gram negative bacteria, not a single organism. CRITICAL RESULT CALLED TO, READ BACK BY AND VERIFIED WITH: NATHAN BELUE @ 0519 06/22/22 LFD    Enterobacter cloacae complex NOT DETECTED NOT DETECTED Final   Escherichia coli DETECTED (A) NOT DETECTED Final    Comment: CRITICAL RESULT CALLED TO, READ BACK BY AND VERIFIED WITH: NATHAN BELUE @ 0519 06/22/22 LFD    Klebsiella aerogenes NOT DETECTED NOT DETECTED Final   Klebsiella oxytoca NOT DETECTED NOT DETECTED Final   Klebsiella pneumoniae NOT DETECTED NOT DETECTED Final   Proteus species NOT  DETECTED NOT DETECTED Final   Salmonella species NOT DETECTED NOT DETECTED Final   Serratia marcescens NOT DETECTED NOT DETECTED Final   Haemophilus influenzae NOT DETECTED NOT DETECTED Final   Neisseria meningitidis NOT DETECTED NOT DETECTED Final   Pseudomonas aeruginosa NOT DETECTED NOT DETECTED Final   Stenotrophomonas maltophilia NOT DETECTED NOT DETECTED Final   Candida albicans NOT DETECTED NOT DETECTED Final   Candida auris NOT DETECTED NOT DETECTED Final   Candida glabrata NOT DETECTED NOT DETECTED Final   Candida krusei NOT DETECTED NOT DETECTED Final   Candida parapsilosis NOT DETECTED NOT DETECTED Final   Candida tropicalis NOT DETECTED NOT DETECTED Final   Cryptococcus neoformans/gattii NOT DETECTED NOT DETECTED Final   CTX-M ESBL NOT DETECTED NOT DETECTED Final   Carbapenem resistance IMP NOT DETECTED NOT DETECTED Final   Carbapenem resistance KPC NOT DETECTED NOT DETECTED Final   Carbapenem resistance NDM NOT DETECTED NOT DETECTED Final   Carbapenem resist OXA 48 LIKE NOT DETECTED NOT DETECTED Final   Carbapenem resistance VIM NOT DETECTED NOT DETECTED Final    Comment: Performed at [facility], [address] [place], [place] [address]  Blood Culture (routine x 2)     Status: Abnormal   Collection Time: 06/21/22  4:01 PM   Specimen: BLOOD  Result Value Ref Range Status   Specimen Description   Final    BLOOD RIGHT ANTECUBITAL Performed at [facility], [address] [place], [place] [address]    Special Requests   Final    BOTTLES DRAWN AEROBIC AND ANAEROBIC Blood Culture results may not be optimal due to an excessive volume of blood received in culture bottles Performed at [facility], [address] [place], [place] [address]    Culture  Setup Time   Final    GRAM NEGATIVE RODS  IN BOTH AEROBIC AND ANAEROBIC BOTTLES CRITICAL RESULT CALLED TO, READ BACK BY AND VERIFIED WITH: Dawayne Cirri 1610 06/22/22 LFD Performed at [facility], [address] [place], [place] [address]    Culture (A)  Final    ESCHERICHIA COLI SUSCEPTIBILITIES PERFORMED ON PREVIOUS CULTURE WITHIN THE LAST 5 DAYS. Performed at [facility], [address] [place], [place] [address]    Report Status 06/24/2022 FINAL  Final  Resp panel by RT-PCR (RSV, Flu A&B, Covid)     Status: None   Collection Time: 06/21/22  4:01 PM  Result Value Ref Range Status   SARS Coronavirus 2 by RT PCR NEGATIVE NEGATIVE Final    Comment: (NOTE) SARS-CoV-2 target nucleic acids are NOT DETECTED.  The SARS-CoV-2 RNA is generally detectable in upper respiratory specimens during the acute phase of infection. The lowest concentration of SARS-CoV-2 viral copies this assay can detect is 138 copies/mL. A negative result does not preclude SARS-Cov-2 infection and should not be used as the sole basis for treatment or other patient management decisions. A negative result may occur with  improper specimen collection/handling, submission of specimen other than nasopharyngeal swab, presence of viral mutation(s) within the areas targeted by this assay, and inadequate number of viral copies(<138 copies/mL). A negative result must be combined with clinical observations, patient history, and epidemiological information. The expected result is Negative.  Fact Sheet for Patients:  BloggerCourse.com  Fact Sheet for Healthcare Providers:  SeriousBroker.it  This test is no t yet approved or cleared by the [place] FDA and  has been authorized for detection and/or diagnosis of SARS-CoV-2 by FDA under an Emergency Use Authorization (EUA). This EUA will remain  in effect (meaning this test can be used) for the duration of the COVID-19 declaration under Section 564(b)(1) of the Act, 21 U.S.C.section 360bbb-3(b)(1), unless the authorization is terminated  or revoked sooner.       Influenza A by PCR NEGATIVE  NEGATIVE Final   Influenza B by PCR NEGATIVE NEGATIVE Final    Comment: (NOTE) The Xpert Xpress SARS-CoV-2/FLU/RSV plus assay is intended as an aid in the diagnosis of influenza from Nasopharyngeal swab specimens and should not be used as a sole basis for treatment. Nasal washings and aspirates are unacceptable for Xpert Xpress SARS-CoV-2/FLU/RSV testing.  Fact Sheet for Patients: BloggerCourse.com  Fact Sheet for Healthcare Providers: SeriousBroker.it  This test is not yet approved or cleared by the [place] FDA and has been authorized for detection and/or diagnosis of SARS-CoV-2 by FDA under an Emergency Use Authorization (EUA). This EUA will remain in effect (meaning this test can be used) for the duration of the COVID-19 declaration under Section 564(b)(1) of the Act, 21 U.S.C. section 360bbb-3(b)(1), unless the authorization is terminated or revoked.     Resp Syncytial Virus by PCR NEGATIVE NEGATIVE Final    Comment: (NOTE) Fact Sheet for Patients: BloggerCourse.com  Fact Sheet for Healthcare Providers: SeriousBroker.it  This test is not yet approved or cleared by the [place] FDA and has been authorized for detection and/or diagnosis of SARS-CoV-2 by FDA under an Emergency Use Authorization (EUA). This EUA will remain in effect (meaning this test can be used) for the duration of the COVID-19 declaration under Section 564(b)(1) of the Act, 21 U.S.C. section 360bbb-3(b)(1), unless the authorization is terminated or revoked.  Performed at [facility], [address], [place], [place] [address]   Urine Culture     Status: Abnormal   Collection  Time: 06/21/22  7:47 PM   Specimen: Urine, Random  Result Value Ref Range Status   Specimen Description   Final    URINE, RANDOM Performed at [facility], [address] [place], [place]  [address]    Special Requests   Final    NONE Reflexed from [id number] Performed at [facility], [address] [place], [place] [address]    Culture MULTIPLE SPECIES PRESENT, SUGGEST RECOLLECTION (A)  Final   Report Status 06/23/2022 FINAL  Final  MRSA Next Gen by PCR, Nasal     Status: Abnormal   Collection Time: 06/21/22 11:02 PM   Specimen: Nasal Mucosa; Nasal Swab  Result Value Ref Range Status   MRSA by PCR Next Gen DETECTED (A) NOT DETECTED Final    Comment: CRITICAL RESULT CALLED TO, READ BACK BY AND VERIFIED WITH: SCRIVNER,IVAN RN @ 0210 06/22/22 LFD Performed at [facility], [address] [place], [place] [address]   Urine Culture     Status: Abnormal   Collection Time: 06/25/22  7:54 AM   Specimen: Urine, Cystoscope  Result Value Ref Range Status   Specimen Description   Final    KIDNEY Performed at [facility], [address] [place], [place] [address]    Special Requests   Final    URINE FROM LEFT RENAL PELVIS Performed at [facility], [address] [place], [place] [address]    Culture 10,000 COLONIES/mL ESCHERICHIA COLI (A)  Final   Report Status 06/27/2022 FINAL  Final   Organism ID, Bacteria ESCHERICHIA COLI (A)  Final      Susceptibility   Escherichia coli - MIC*    AMPICILLIN 8 SENSITIVE Sensitive     CEFEPIME <=0.12 SENSITIVE Sensitive     CEFTAZIDIME <=1 SENSITIVE Sensitive     CEFTRIAXONE <=0.25 SENSITIVE Sensitive     CIPROFLOXACIN <=0.25 SENSITIVE Sensitive     GENTAMICIN <=1 SENSITIVE Sensitive     IMIPENEM <=0.25 SENSITIVE Sensitive     TRIMETH/SULFA <=20 SENSITIVE Sensitive     AMPICILLIN/SULBACTAM 4 SENSITIVE Sensitive     PIP/TAZO <=4 SENSITIVE Sensitive     * 10,000 COLONIES/mL ESCHERICHIA COLI    Radiology Studies: ECHOCARDIOGRAM COMPLETE  Result Date: 06/26/2022    ECHOCARDIOGRAM REPORT   Patient Name:   Emanuelle A Raso Date of Exam: 06/26/2022 Medical Rec #:  [id number]           Height:       70.0  in Accession #:    [id number]          Weight:       229.7 lb Date of Birth:  [birth year]           BSA:          2.214 m Patient Age:    77 years            BP:           137/92 mmHg Patient Gender: F                   HR:           69 bpm. Exam Location:  [facility] Procedure: 2D Echo, Cardiac Doppler and Color Doppler Indications:     Atrial Fibrillation I48.9  History:         Patient has no prior history of Echocardiogram examinations.  Signs/Symptoms:Dyspnea; Risk Factors:Hypertension. Pneumonia.  Sonographer:     Cristela Blue Referring Phys:  [id number] Antonieta Iba Diagnosing Phys: Julien Nordmann MD IMPRESSIONS  1. Left ventricular ejection fraction, by estimation, is 25 to 30%. Left ventricular ejection fraction by 2D MOD biplane is 28.7 %. The left ventricle has severely decreased function. The left ventricle demonstrates global hypokinesis. Left ventricular diastolic parameters are consistent with Grade II diastolic dysfunction (pseudonormalization).  2. Right ventricular systolic function is mildly reduced. The right ventricular size is normal. There is mildly elevated pulmonary artery systolic pressure. The estimated right ventricular systolic pressure is 37.0 mmHg.  3. Left atrial size was moderately dilated.  4. A small pericardial effusion is present. There is no evidence of cardiac tamponade. Moderate pleural effusion in the left lateral region.  5. The mitral valve is normal in structure. Moderate mitral valve regurgitation. No evidence of mitral stenosis.  6. The aortic valve is normal in structure. Aortic valve regurgitation is trivial. No aortic stenosis is present.  7. The inferior vena cava is normal in size with greater than 50% respiratory variability, suggesting right atrial pressure of 3 mmHg. FINDINGS  Left Ventricle: Left ventricular ejection fraction, by estimation, is 25 to 30%. Left ventricular ejection fraction by 2D MOD biplane is 28.7 %. The left ventricle has severely  decreased function. The left ventricle demonstrates global hypokinesis. The left ventricular internal cavity size was normal in size. There is no left ventricular hypertrophy. Left ventricular diastolic parameters are consistent with Grade II diastolic dysfunction (pseudonormalization). Right Ventricle: The right ventricular size is normal. No increase in right ventricular wall thickness. Right ventricular systolic function is mildly reduced. There is mildly elevated pulmonary artery systolic pressure. The tricuspid regurgitant velocity  is 2.60 m/s, and with an assumed right atrial pressure of 10 mmHg, the estimated right ventricular systolic pressure is 37.0 mmHg. Left Atrium: Left atrial size was moderately dilated. Right Atrium: Right atrial size was normal in size. Pericardium: A small pericardial effusion is present. There is no evidence of cardiac tamponade. Mitral Valve: The mitral valve is normal in structure. Moderate mitral valve regurgitation. No evidence of mitral valve stenosis. MV peak gradient, 3.9 mmHg. The mean mitral valve gradient is 1.0 mmHg. Tricuspid Valve: The tricuspid valve is normal in structure. Tricuspid valve regurgitation is mild . No evidence of tricuspid stenosis. Aortic Valve: The aortic valve is normal in structure. Aortic valve regurgitation is trivial. No aortic stenosis is present. Aortic valve mean gradient measures 2.0 mmHg. Aortic valve peak gradient measures 3.7 mmHg. Aortic valve area, by VTI measures 2.73 cm. Pulmonic Valve: The pulmonic valve was normal in structure. Pulmonic valve regurgitation is not visualized. No evidence of pulmonic stenosis. Aorta: The aortic root is normal in size and structure. Venous: The inferior vena cava is normal in size with greater than 50% respiratory variability, suggesting right atrial pressure of 3 mmHg. IAS/Shunts: No atrial level shunt detected by color flow Doppler. Additional Comments: There is a moderate pleural effusion in the  left lateral region.  LEFT VENTRICLE PLAX 2D                        Biplane EF (MOD) LVIDd:         4.40 cm         LV Biplane EF:   Left LVIDs:         3.50 cm  ventricular LV PW:         1.70 cm                          ejection LV IVS:        1.20 cm                          fraction by LVOT diam:     2.00 cm                          2D MOD LV SV:         45                               biplane is LV SV Index:   20                               28.7 %. LVOT Area:     3.14 cm                                Diastology                                LV e' medial:    4.57 cm/s LV Volumes (MOD)               LV E/e' medial:  21.9 LV vol d, MOD    94.9 ml       LV e' lateral:   6.31 cm/s A2C:                           LV E/e' lateral: 15.8 LV vol d, MOD    113.0 ml A4C: LV vol s, MOD    67.4 ml A2C: LV vol s, MOD    70.9 ml A4C: LV SV MOD A2C:   27.5 ml LV SV MOD A4C:   113.0 ml LV SV MOD BP:    28.4 ml RIGHT VENTRICLE RV Basal diam:  4.50 cm RV Mid diam:    3.30 cm RV S prime:     11.70 cm/s TAPSE (M-mode): 2.2 cm LEFT ATRIUM             Index        RIGHT ATRIUM           Index LA diam:        4.80 cm 2.17 cm/m   RA Area:     17.90 cm LA Vol (A2C):   33.6 ml 15.18 ml/m  RA Volume:   48.90 ml  22.09 ml/m LA Vol (A4C):   46.1 ml 20.82 ml/m LA Biplane Vol: 40.8 ml 18.43 ml/m  AORTIC VALVE AV Area (Vmax):    2.31 cm AV Area (Vmean):   2.13 cm AV Area (VTI):     2.73 cm AV Vmax:           96.60 cm/s AV Vmean:          65.000 cm/s AV VTI:            0.166 m AV Peak Grad:  3.7 mmHg AV Mean Grad:      2.0 mmHg LVOT Vmax:         71.10 cm/s LVOT Vmean:        44.100 cm/s LVOT VTI:          0.144 m LVOT/AV VTI ratio: 0.87  AORTA Ao Root diam: 3.10 cm MITRAL VALVE                TRICUSPID VALVE MV Area (PHT): 6.07 cm     TR Peak grad:   27.0 mmHg MV Area VTI:   2.14 cm     TR Vmax:        260.00 cm/s MV Peak grad:  3.9 mmHg MV Mean grad:  1.0 mmHg     SHUNTS MV Vmax:       0.98 m/s      Systemic VTI:  0.14 m MV Vmean:      56.5 cm/s    Systemic Diam: 2.00 cm MV Decel Time: 125 msec MV E velocity: 100.00 cm/s MV A velocity: 71.30 cm/s MV E/A ratio:  1.40 Julien Nordmann MD Electronically signed by Julien Nordmann MD Signature Date/Time: 06/26/2022/12:33:49 PM    Final     Scheduled Meds:  amiodarone  400 mg Oral BID   [START ON 06/28/2022] cefadroxil  1,000 mg Oral BID   cholecalciferol  2,000 Units Oral Daily   donepezil  10 mg Oral QHS   enoxaparin (LOVENOX) injection  50 mg Subcutaneous Q24H   escitalopram  5 mg Oral Daily   feeding supplement  237 mL Oral BID BM   ferrous sulfate  324 mg Oral Q breakfast   furosemide  40 mg Intravenous BID   melatonin  10 mg Oral QHS   metoprolol tartrate  25 mg Oral BID   multivitamin with minerals  1 tablet Oral Daily   potassium chloride  40 mEq Oral Once   senna-docusate  1 tablet Oral Daily   Continuous Infusions:  sodium chloride 10 mL/hr at 06/26/22 1300   magnesium sulfate bolus IVPB       LOS: 6 days    Time spent: 35 mins    Willeen Niece, MD [facility]   If 7PM-7AM, please contact night-coverage

## 2022-06-27 NOTE — Telephone Encounter (Signed)
Pharmacy Patient Advocate Encounter  Insurance verification completed.    The patient is insured through [employer] Part D   The patient is currently admitted and ran test claims for the following: Entresto  Copays and coinsurance results were relayed to Inpatient clinical team. 

## 2022-06-27 NOTE — Op Note (Signed)
   Preoperative diagnosis:  Left renal pelvic calculus-obstructing  Postoperative diagnosis:  Same Pyonephrosis  Procedure:  Cystoscopy Left ureteral stent placement (27F/26 cm)  Left retrograde pyelography with interpretation   Surgeon: Lorin Picket C. Reon Hunley, M.D.  Anesthesia: General  Complications: None  Intraoperative findings:  Cystoscopy-bladder mucosa normal in appearance without erythema, solid or papillary lesions.  UOs normal-appearing bilaterally Left retrograde pyelogram-obstructing left renal pelvic calculus  EBL: Minimal  Specimens: Urine left renal pelvis for culture  Indication: Alison Price is a 77 y.o. female with a prior history of stone disease admitted 06/21/2022 with septic shock from a urinary source.  She was admitted to the ICU and responded clinically.  A CT scan was ordered 06/23/2022 which showed left hydronephrosis secondary to a 2 cm obstructing left UPJ calculus.  After reviewing the management options for treatment, she elected to proceed with the above surgical procedure(s). We have discussed the potential benefits and risks of the procedure, side effects of the proposed treatment, the likelihood of the patient achieving the goals of the procedure, and any potential problems that might occur during the procedure or recuperation. Informed consent has been obtained.  Description of procedure:  The patient was taken to the operating room and general anesthesia was induced.  The patient was placed in the dorsal lithotomy position, prepped and draped in the usual sterile fashion, and preoperative antibiotics were administered. A preoperative time-out was performed.   A 21 French scope was lubricated and passed per urethra.  Panendoscopy was performed with findings as described above.  Attention then turned to the left ureteral orifice and a ureteral catheter was positioned at the left UO and a 0.038 sensor wire was placed at the catheter and into the left  ureter.  The wire was advanced and able to be negotiated past the stone without difficulty under C arm fluoroscopy.  The open-ended ureteral catheter was then advanced over the guidewire proximal to the calculus.  The guidewire was removed and 10 cc of purulent urine was aspirated and sent for culture.  Retrograde pyelogram was then performed through the ureteral catheter with findings as described above.  The guidewire was replaced and the ureteral catheter was removed.  A 27F/26 cm contour ureteral stent was then advanced over the guidewire without difficulty.  Good positioning was noted in an upper pole calyx under C-arm fluoroscopy and the distal end of the stent was well-positioned in the bladder under direct vision.  The bladder was emptied and cystoscope was removed.  After anesthetic reversal she was transported to the PACU in able condition.  Plan: She will need follow-up definitive stone treatment in the next few weeks   Irineo Axon, MD

## 2022-06-27 NOTE — Progress Notes (Signed)
Physical Therapy Treatment Patient Details Name: Alison Price MRN: [id number] DOB: 02-08-1946 Today's Date: 06/27/2022   History of Present Illness Patient is a 77 yo female that presented to ED for AMS, workup for sepsis, acute renal insufficiency and afib. PMH of anemia, GERD, HTN, stevens-johnson syndrome.  S/P CYSTOSCOPY WITH URETEROSCOPY AND STENT PLACEMENT 5/8.    PT Comments    Modified session due to significant dementia affecting pt's tolerance/understanding of PT purpose. Attempted mobility after standing with Mod/MaxA at [facility], pt became tearful due to fear of not knowing where she is and current circumstances. Attempted strengthening in sitting, again limited. Pt repositioned to comfort with lunch in front, however would not eat. Chair alarm on, nursing in room upon completion   Recommendations for follow up therapy are one component of a multi-disciplinary discharge planning process, led by the attending physician.  Recommendations may be updated based on patient status, additional functional criteria and insurance authorization.  Follow Up Recommendations  Can patient physically be transported by private vehicle: No    Assistance Recommended at Discharge Frequent or constant Supervision/Assistance  Patient can return home with the following Two people to help with walking and/or transfers;Assistance with cooking/housework;Direct supervision/assist for medications management;Assist for transportation;Help with stairs or ramp for entrance;Assistance with feeding;Two people to help with bathing/dressing/bathroom   Equipment Recommendations  Other (comment) (TBD)    Recommendations for Other Services       Precautions / Restrictions Precautions Precautions: Fall Restrictions Weight Bearing Restrictions: No     Mobility  Bed Mobility               General bed mobility comments:  (Pt up in chair pre/post session)    Transfers Overall transfer level: Needs  assistance Equipment used: Rolling walker (2 wheels) Transfers: Sit to/from Stand Sit to Stand: Mod assist           General transfer comment:  (Unable to progress due to dementia and tearful throughout session)    Ambulation/Gait               General Gait Details: unable to ambulate; managed to take a few very small, unsteady, labored side steps with excessive cuing   Stairs             Wheelchair Mobility    Modified Rankin (Stroke Patients Only)       Balance                                            Cognition Arousal/Alertness: Awake/alert Behavior During Therapy: Restless, Anxious (Tearful due to confusion) Overall Cognitive Status: History of cognitive impairments - at baseline                                 General Comments:  (Pt only oriented to self)        Exercises      General Comments General comments (skin integrity, edema, etc.):  (Increased time to encourage pt to participate due to her confusion level.)      Pertinent Vitals/Pain Pain Assessment Pain Assessment: No/denies pain    Home Living                          Prior Function  PT Goals (current goals can now be found in the care plan section)      Frequency    Min 2X/week      PT Plan Current plan remains appropriate    Co-evaluation              AM-PAC PT "6 Clicks" Mobility   Outcome Measure  Help needed turning from your back to your side while in a flat bed without using bedrails?: A Lot Help needed moving from lying on your back to sitting on the side of a flat bed without using bedrails?: A Lot Help needed moving to and from a bed to a chair (including a wheelchair)?: A Lot Help needed standing up from a chair using your arms (e.g., wheelchair or bedside chair)?: A Lot Help needed to walk in hospital room?: A Lot Help needed climbing 3-5 steps with a railing? : Total 6 Click Score:  11    End of Session Equipment Utilized During Treatment: Gait belt Activity Tolerance: Other (comment) (limited by cognition) Patient left: in chair;with chair alarm set;with call bell/phone within reach Nurse Communication: Mobility status (cognition/tearful/fearful) PT Visit Diagnosis: Other abnormalities of gait and mobility (R26.89);Muscle weakness (generalized) (M62.81)     Time: 4098-1191 PT Time Calculation (min) (ACUTE ONLY): 19 min  Charges:  $Therapeutic Activity: 8-22 mins                    Zadie Cleverly, PTA   Jannet Askew 06/27/2022, 1:58 PM

## 2022-06-27 NOTE — Progress Notes (Signed)
Nutrition Follow-up  DOCUMENTATION CODES:   Severe malnutrition in context of chronic illness  INTERVENTION:   -MVI with minerals daily -Continue Ensure Enlive po BID, each supplement provides 350 kcal and 20 grams of protein -Magic cup TID with meals, each supplement provides 290 kcal and 9 grams of protein   NUTRITION DIAGNOSIS:   Severe Malnutrition related to chronic illness as evidenced by severe muscle depletion, severe fat depletion.  Ongoing  GOAL:   Patient will meet greater than or equal to 90% of their needs  Unmet  MONITOR:   PO intake, Supplement acceptance  REASON FOR ASSESSMENT:   Consult Assessment of nutrition requirement/status  ASSESSMENT:   77 y.o. female admits related to AMS. PMH includes: dementia, HTN, GERD, arthritis, Stevens-Johnson's syndrome. Pt is currently receiving medical management related to severe sepsis with lactic acidosis.  5/5- s/p cystoscopy with lt uretal stent placement   Per urology notes, pt with obstructing lt renal pelvic calculus.   Pt receiving personal care at time of visit.   Pt on a dysphagia 3 diet. Pt with poor oral intake. Noted meal completions 0%. Pt is variably accepting of Ensure supplements.   Suspect wt from 06/26/22 is inaccurate.   Plan d/c to SNF tomorrow.   Medications reviewed and include ferrous sulfate, lasix, melatonin, potassium chloride, magnesium sulfateand senokot.   Labs reviewed: K: 3.3 (on supplementation), Mg: 1.3 (on supplementation), CBGS: 92.    Diet Order:   Diet Order             DIET DYS 3 Room service appropriate? Yes; Fluid consistency: Thin  Diet effective now                   EDUCATION NEEDS:   Not appropriate for education at this time  Skin:  Skin Assessment: Reviewed RN Assessment  Last BM:  06/25/22  Height:   Ht Readings from Last 1 Encounters:  06/21/22 5\' 10"  (1.778 m)    Weight:   Wt Readings from Last 1 Encounters:  06/26/22 104.2 kg   BMI:   Body mass index is 32.96 kg/m.  Estimated Nutritional Needs:   Kcal:  1750-1950  Protein:  85-100 grams  Fluid:  > 1.7 L    Levada Schilling, RD, LDN, CDCES Registered Dietitian II Certified Diabetes Care and Education Specialist Please refer to [facility] for RD and/or RD on-call/weekend/after hours pager

## 2022-06-27 NOTE — Progress Notes (Signed)
Mobility Specialist - Progress Note   06/27/22 1114  Mobility  Activity Stood at bedside;Transferred to/from [facility];Ambulated with assistance in room;Transferred from bed to chair  Level of Assistance Standby assist, set-up cues, supervision of patient - no hands on  Assistive Device Front wheel walker  Distance Ambulated (ft) 6 ft  Activity Response Tolerated well  $Mobility charge 1 Mobility  Mobility Specialist Start Time (ACUTE ONLY) 1030  Mobility Specialist Stop Time (ACUTE ONLY) 1111  Mobility Specialist Time Calculation (min) (ACUTE ONLY) 41 min   Pt supine upon entry w/ soaked clothing and lien, utilizing RA. Pt agreeable to pedi care and transfer to the recliner. Pt actively completed one supine glute bridge while MS doffed underwear, completed bed mob with MinA-- truck support to transfer EOB. Once seated EOB, Pt STS to RW ModA +2-- for safety, requiring min Vcs for hand placement and manual movement of the RW. Upon standing, Pt having active BM, transferred to the [facility] while MS completed pedi care-- actively completed one Bilat seated march while MS donned underwear. Pt transferred from the [facility] to the recliner SBA, left seated with alarm set and needs within reach.   Zetta Bills Mobility Specialist 06/27/22 11:25 AM

## 2022-06-27 NOTE — TOC Benefit Eligibility Note (Addendum)
Patient Product/process development scientist completed.    The patient is currently admitted and upon discharge could be taking Entresto 24-26 mg.  The current 30 day co-pay is $47.00.   The patient is currently admitted and upon discharge could be taking Eliquis 5 mg.  The current 30 day co-pay is $47.00.   The patient is insured through [employer] Part D   This test claim was processed through [facility]- copay amounts may vary at other pharmacies due to pharmacy/plan contracts, or as the patient moves through the different stages of their insurance plan.  Roland Earl, CPHT Pharmacy Patient Advocate Specialist [facility] Patient Advocate Team Direct Number: [phone]  Fax: [phone]

## 2022-06-27 NOTE — Consult Note (Signed)
PHARMACY CONSULT NOTE  Pharmacy Consult for Electrolyte Monitoring and Replacement   Recent Labs: Potassium (mmol/L)  Date Value  06/27/2022 3.3 (L)  05/04/2013 3.7   Magnesium (mg/dL)  Date Value  40/98/1191 1.3 (L)  04/20/2011 1.9   Calcium (mg/dL)  Date Value  47/82/9562 8.2 (L)   Calcium, Total (mg/dL)  Date Value  13/09/6576 8.9   Albumin (g/dL)  Date Value  46/96/2952 3.6  01/06/2013 3.6   Phosphorus (mg/dL)  Date Value  84/13/2440 3.5   Sodium (mmol/L)  Date Value  06/27/2022 142  04/19/2011 139   Assessment: 77 y/o F with medical history including dementia, HTN, GERD, arthritis, Stevens-Johnson's syndrome (Bactrim), recurrent falls with recent right patella and left wrist fracture who was admitted 5/4 with septic shock secondary to UTI and new-onset Afib with RVR. Pharmacy consulted to assist with electrolyte monitoring and replacement as indicated.  Diuresis: IV Lasix 40 mg BID  Goal of Therapy:  K > 4, Mg > 2  Plan:  --K 3.3, Kcl 40 mEq PO x 1 dose ordered per provider. Will order one more dose this evening given ongoing diuresis --Mg 1.3, magnesium sulfate 2 g IV x 1 ordered per provider. Will give another dose this evening given ongoing diuresis --Follow-up electrolytes with AM labs tomorrow  Tressie Ellis 06/27/2022 8:50 AM

## 2022-06-27 NOTE — TOC Progression Note (Signed)
Transition of Care [facility]) - Progression Note    Patient Details  Name: Alison Price MRN: [id number] Date of Birth: 08-18-1945  [facility] CM/SW Contact  Allena Katz, LCSW Phone Number: 06/27/2022, 8:42 AM  Clinical Narrative:   [name] obtained for [facility]. MD states pt will not be ready until tomorrow. CSW has spoke with Marcelino Duster at [facility] who is able to take patient tomorrow.          Expected Discharge Plan and Services                                               Social Determinants of Health (SDOH) Interventions SDOH Screenings   Tobacco Use: Medium Risk (06/26/2022)    Readmission Risk Interventions     No data to display

## 2022-06-27 NOTE — Care Management Important Message (Signed)
Important Message  Patient Details  Name: Alison Price MRN: [id number] Date of Birth: [birth year]   Medicare Important Message Given:  N/A - LOS <3 / Initial given by admissions     Olegario Messier A Suzette Flagler 06/27/2022, 9:01 AM

## 2022-06-27 NOTE — Progress Notes (Signed)
Cardiology Progress Note   Patient Name: Alison Price Date of Encounter: 06/27/2022  Primary Cardiologist: Julien Nordmann, MD  Subjective   Pt sitting in chair @ bedside.  Family present.  No chest pain or dyspnea.  Inpatient Medications    Scheduled Meds:  [START ON 07/02/2022] amiodarone  200 mg Oral BID   amiodarone  400 mg Oral BID   apixaban  5 mg Oral BID   [START ON 06/28/2022] cefadroxil  1,000 mg Oral BID   cholecalciferol  2,000 Units Oral Daily   donepezil  10 mg Oral QHS   escitalopram  5 mg Oral Daily   feeding supplement  237 mL Oral BID BM   ferrous sulfate  324 mg Oral Q breakfast   furosemide  40 mg Intravenous BID   melatonin  10 mg Oral QHS   metoprolol tartrate  25 mg Oral BID   multivitamin with minerals  1 tablet Oral Daily   potassium chloride  40 mEq Oral Once   sacubitril-valsartan  1 tablet Oral BID   senna-docusate  1 tablet Oral Daily   Continuous Infusions:  sodium chloride 10 mL/hr at 06/26/22 1300   magnesium sulfate bolus IVPB     PRN Meds: acetaminophen, metoprolol tartrate, ondansetron, oxyCODONE, polyethylene glycol, senna-docusate   Vital Signs    Vitals:   06/26/22 2348 06/27/22 0342 06/27/22 0908 06/27/22 1141  BP: 126/75 125/72 133/77 120/72  Pulse: 68 74 68 (!) 59  Resp: 20 18 14 16   Temp: 97.7 F (36.5 C) 98.2 F (36.8 C) 97.6 F (36.4 C) 97.8 F (36.6 C)  TempSrc:   Oral Oral  SpO2: 98% 96% 97% 99%  Weight:      Height:        Intake/Output Summary (Last 24 hours) at 06/27/2022 1220 Last data filed at 06/27/2022 0501 Gross per 24 hour  Intake 560 ml  Output 3150 ml  Net -2590 ml   Filed Weights   06/22/22 0700 06/24/22 0500 06/26/22 0603  Weight: 58.4 kg 59 kg 104.2 kg    Physical Exam   GEN: Well nourished, well developed, in no acute distress.  HEENT: Grossly normal.  Neck: Supple, no JVD, carotid bruits, or masses. Cardiac: RRR, no murmurs, rubs, or gallops. No clubbing, cyanosis, edema.   Radials 2+, DP/PT 2+ and equal bilaterally.  Respiratory:  Respirations regular and unlabored, diminished breath sounds bilat. GI: Soft, nontender, nondistended, BS + x 4. MS: no deformity or atrophy. Skin: warm and dry, no rash. Neuro:  Strength and sensation are intact. Psych: Flat affect. Disoriented to time/place.  Labs    Chemistry Recent Labs  Lab 06/21/22 1601 06/21/22 2301 06/25/22 0622 06/26/22 0540 06/27/22 0514  NA 141   < > 141 137 142  K 2.9*   < > 3.5 3.2* 3.3*  CL 104   < > 109 105 103  CO2 22   < > 23 22 29   GLUCOSE 83   < > 94 102* 110*  BUN 64*   < > 63* 46* 35*  CREATININE 3.07*   < > 1.62* 1.22* 1.21*  CALCIUM 8.3*   < > 8.2* 7.9* 8.2*  PROT 6.8  --   --   --   --   ALBUMIN 3.6  --   --   --   --   AST 46*  --   --   --   --   ALT 12  --   --   --   --  ALKPHOS 134*  --   --   --   --   BILITOT 1.3*  --   --   --   --   GFRNONAA 15*   < > 33* 46* 46*  ANIONGAP 15   < > 9 10 10    < > = values in this interval not displayed.     Hematology Recent Labs  Lab 06/25/22 0622 06/26/22 0540 06/27/22 0514  WBC 8.7 10.9* 9.9  RBC 3.06* 2.89* 3.36*  HGB 9.5* 9.2* 10.4*  HCT 28.7* 27.2* 31.3*  MCV 93.8 94.1 93.2  MCH 31.0 31.8 31.0  MCHC 33.1 33.8 33.2  RDW 14.5 14.3 14.2  PLT 72* 114* 153    Cardiac Enzymes  Recent Labs  Lab 06/21/22 2301 06/21/22 2358  TROPONINIHS 85* 91*      BNP    Component Value Date/Time   BNP 767 (H) 04/19/2011 0444    DDimer  Recent Labs  Lab 06/21/22 2301  DDIMER 8.22*     Lipids  Lab Results  Component Value Date   CHOL 240 (H) 06/11/2018   HDL 79 06/11/2018   LDLCALC 150 (H) 06/11/2018   TRIG 53 06/11/2018   CHOLHDL 3.0 06/11/2018    HbA1c  Lab Results  Component Value Date   HGBA1C 5.0 06/29/2019    Radiology    US Abdomen Limited RUQ (LIVER/GB)  Result Date: 06/24/2022 CLINICAL DATA:  Abdominal pain EXAM: ULTRASOUND ABDOMEN LIMITED RIGHT UPPER QUADRANT COMPARISON:  None Available.  FINDINGS: Gallbladder: Dilated gallbladder. Borderline wall thickening. No shadowing stones. There is some sludge. There is some adjacent fluid as well to the gallbladder. Common bile duct: Diameter: 6 mm Liver: Nodular contours of the liver with mild ascites. Right-sided pleural effusion. Portal vein is patent on color Doppler imaging with normal direction of blood flow towards the liver. Other: None. IMPRESSION: Distended gallbladder with sludge.  No stones. There is some wall thickening and adjacent fluid although there is a nodular appearance of the liver with the ascites. Please correlate for chronic liver disease. Overall if there is further concern of acalculous cholecystitis with the dilatation and wall thickening, HIDA scan may be useful as the next step in the workup Electronically Signed   By: Karen Kays M.D.   On: 06/24/2022 12:05   CT RENAL STONE STUDY  Result Date: 06/23/2022 CLINICAL DATA:  Abdominal/flank pain, stone suspected EXAM: CT ABDOMEN AND PELVIS WITHOUT CONTRAST TECHNIQUE: Multidetector CT imaging of the abdomen and pelvis was performed following the standard protocol without IV contrast. RADIATION DOSE REDUCTION: This exam was performed according to the departmental dose-optimization program which includes automated exposure control, adjustment of the mA and/or kV according to patient size and/or use of iterative reconstruction technique. COMPARISON:  Contrast enhanced CT 04/13/2021 FINDINGS: Lower chest: Small to moderate bilateral pleural effusions. Associated bibasilar airspace disease. Additional airspace disease in the lingula and right middle lobe is chronic when compared with prior abdominal CT. There are coronary artery calcifications. Mitral annulus calcifications. The heart is enlarged. Hepatobiliary: Motion artifact through the liver, allowing for this, no evidence of focal liver lesion. The gallbladder is distended. Assessment for pericholecystic inflammation is limited due  to motion through this region. The tiny gallstones on prior exam are not definitively seen. The common bile duct and biliary tree are difficult to define. There is no obvious biliary dilatation. Pancreas: Parenchymal atrophy. Motion artifact through the proximal pancreas. There is no pancreatic inflammation. Spleen: Normal in size without focal abnormality.  Adrenals/Urinary Tract: No adrenal nodule. There is a 2 cm stone in the left renal pelvis extending into the proximal ureter with moderate hydronephrosis. 14 mm no obstructing stone in the lower pole of the left kidney and 3 mm nonobstructing stone in the upper pole of the left kidney. No significant left perinephric edema. Right renal cortical atrophy. Calcification in the posterior mid upper kidney likely represents a combination of parenchymal calcification a nonobstructing stone, linear 11 mm calcification may represent a stone fragment in the renal collecting system. Previous proximal right ureteral stone is no longer seen. There is mild right perirenal scarring. No definite ureteral calculi. Nondistended urinary bladder. No bladder stone. Stomach/Bowel: No bowel obstruction or inflammation. The appendix not confidently visualized, no appendicitis. Small to moderate volume of stool throughout the colon. Occasional colonic diverticula, no diverticulitis. Vascular/Lymphatic: Moderate aortic atherosclerosis. Aortic tortuosity but no aneurysm. Assessment for adenopathy is limited in the absence of contrast and patient motion. No obvious bulky enlarged lymph nodes. Reproductive: Hysterectomy. Small amount of air in the vagina is nonspecific. No adnexal mass. Other: Small volume of free fluid in the pelvis. Trace free fluid in the right upper quadrant. There is mild body wall subcutaneous edema. No free intra-abdominal air. Musculoskeletal: Interbody spacer at L4-L5. Mild compression deformities versus Schmorl's nodes within superior endplate of T11, L1 and L3,  chronic. The bones are subjectively under mineralized. Left proximal femur surgical hardware is partially included. No acute osseous findings. IMPRESSION: 1. Obstructing 2 cm stone in the left renal pelvis extending into the proximal ureter with moderate hydronephrosis. Additional nonobstructing stones in the left kidney. 2. Mild right renal scarring. Calcifications in the right kidney may be combination of parenchymal calcifications and stone fragments. 3. Hydropic gallbladder. Assessment for pericholecystic inflammation is limited due to motion through this region. The tiny gallstones on prior exam are not definitively seen. If there is clinical concern for acute gallbladder pathology, recommend right upper quadrant ultrasound. 4. Small to moderate bilateral pleural effusions with adjacent airspace disease, likely atelectasis. Additional airspace disease in the lingula and right middle lobe is chronic when compared with prior abdominal CT. 5. Small volume of free fluid in the pelvis and right upper quadrant, nonspecific. Mild body wall edema. Aortic Atherosclerosis (ICD10-I70.0). Electronically Signed   By: Narda Rutherford M.D.   On: 06/23/2022 18:18    Telemetry    Brief run of SVT, otw maintaining sinus rhythm - Personally Reviewed  Cardiac Studies   2D Echocardiogram 06/27/2022  1. Left ventricular ejection fraction, by estimation, is 25 to 30%. Left  ventricular ejection fraction by 2D MOD biplane is 28.7 %. The left  ventricle has severely decreased function. The left ventricle demonstrates  global hypokinesis. Left ventricular  diastolic parameters are consistent with Grade II diastolic dysfunction  (pseudonormalization).   2. Right ventricular systolic function is mildly reduced. The right  ventricular size is normal. There is mildly elevated pulmonary artery  systolic pressure. The estimated right ventricular systolic pressure is  37.0 mmHg.   3. Left atrial size was moderately dilated.    4. A small pericardial effusion is present. There is no evidence of  cardiac tamponade. Moderate pleural effusion in the left lateral region.   5. The mitral valve is normal in structure. Moderate mitral valve  regurgitation. No evidence of mitral stenosis.   6. The aortic valve is normal in structure. Aortic valve regurgitation is  trivial. No aortic stenosis is present.   7. The inferior vena cava is  normal in size with greater than 50%  respiratory variability, suggesting right atrial pressure of 3 mmHg.   Patient Profile     77 year old woman with history of dementia, hypertension, GERD, kidney stones, falls presenting to the hospital with sepsis, mental status changes, AKI/renal stone/hydronephrosis, hypotension, tachycardia and PAF.   Assessment & Plan    1.  PAF: in setting of sepsis, AKI, nephrolithiasis/hydronephrosis s/p ureteral stenting, and dementia. Maintaining sinus on PO amio and metoprolol.  Has received 2.6 gram load up to this point.  Will cont 400 bid, w/ goal of 5 gram load given PAF this admission, followed by 200 bid.  Discussed OAC w/ family present today.  No falls in several years.  Likely going to rehab for short peirod. CHA2DS2VASc = 6. Will add eliquis 5 mg bid.  2.  Acute systolic CHF:  Echo 5/9 w/ EF of 25-30%, GrII diast dysfxn, RVSP , small pericardial eff, mod MR, mod L lateral pleural effusion.  IV lasix added 5/9 w/ good response.  Minus 4.3 recorded and other notes indicate that bed was soaked as well.  Wt not checked today.  Does not appear to be significantly volume overloaded, though renal fxn improving w/ diuresis.  Will cont IV lasix today. Will likely need at least 40 PO daily @ discharge.  Cont ? blocker - will transition to toprol xl tomorrow.  Adding low-dose entresto.  Consider MRA.  Likely poor SGLT2i candidate 2/2 incontinence.  Poor candidate for ischemic eval.  3.  Hypokalemia:  remains hypokalemic.  Supplementation ordered.  4. AKI/L  hydronephrosis:  s/p stent placment.  Creat cont to improve.  5.  Normocytic anemia:  H/H stable.  Follow on eliquis.  Signed, Nicolasa Ducking, NP  06/27/2022, 12:20 PM    For questions or updates, please contact   Please consult [email or web address] for contact info under Cardiology/STEMI.

## 2022-06-28 DIAGNOSIS — E872 Acidosis, unspecified: Secondary | ICD-10-CM | POA: Diagnosis not present

## 2022-06-28 DIAGNOSIS — A419 Sepsis, unspecified organism: Secondary | ICD-10-CM | POA: Diagnosis not present

## 2022-06-28 DIAGNOSIS — R6520 Severe sepsis without septic shock: Secondary | ICD-10-CM | POA: Diagnosis not present

## 2022-06-28 LAB — BASIC METABOLIC PANEL
Anion gap: 9 (ref 5–15)
BUN: 26 mg/dL — ABNORMAL HIGH (ref 8–23)
CO2: 31 mmol/L (ref 22–32)
Calcium: 8.1 mg/dL — ABNORMAL LOW (ref 8.9–10.3)
Chloride: 98 mmol/L (ref 98–111)
Creatinine, Ser: 1.07 mg/dL — ABNORMAL HIGH (ref 0.44–1.00)
GFR, Estimated: 54 mL/min — ABNORMAL LOW (ref >60)
Glucose, Bld: 110 mg/dL — ABNORMAL HIGH (ref 70–99)
Potassium: 3.1 mmol/L — ABNORMAL LOW (ref 3.5–5.1)
Sodium: 138 mmol/L (ref 135–145)

## 2022-06-28 LAB — MAGNESIUM: Magnesium: 2.1 mg/dL (ref 1.7–2.4)

## 2022-06-28 LAB — PHOSPHORUS: Phosphorus: 2.9 mg/dL (ref 2.5–4.6)

## 2022-06-28 MED ORDER — POTASSIUM CHLORIDE 20 MEQ PO PACK
40.0000 meq | PACK | ORAL | Status: AC
  Administered 2022-06-28 (×2): 40 meq via ORAL
  Filled 2022-06-28 (×2): qty 2

## 2022-06-28 MED ORDER — AMIODARONE HCL 200 MG PO TABS
200.0000 mg | ORAL_TABLET | Freq: Every day | ORAL | Status: DC
  Administered 2022-06-29 – 2022-06-30 (×2): 200 mg via ORAL
  Filled 2022-06-28 (×2): qty 1

## 2022-06-28 MED ORDER — FUROSEMIDE 40 MG PO TABS
40.0000 mg | ORAL_TABLET | Freq: Every day | ORAL | Status: DC
  Administered 2022-06-29 – 2022-06-30 (×2): 40 mg via ORAL
  Filled 2022-06-28 (×2): qty 1

## 2022-06-28 MED ORDER — POTASSIUM CHLORIDE 20 MEQ PO PACK: 40.0000 meq | PACK | Freq: Once | ORAL | Status: DC

## 2022-06-28 NOTE — Progress Notes (Signed)
Cardiology Progress Note   Patient Name: Alison Price Date of Encounter: 06/28/2022  Primary Cardiologist: Julien Nordmann, MD  Subjective   Denies dyspnea.  Eager to go home/out of hospital.  Maintaining sinus rhythm.  Inpatient Medications    Scheduled Meds:  [START ON 07/02/2022] amiodarone  200 mg Oral BID   amiodarone  400 mg Oral BID   apixaban  5 mg Oral BID   cefadroxil  1,000 mg Oral BID   cholecalciferol  2,000 Units Oral Daily   donepezil  10 mg Oral QHS   escitalopram  5 mg Oral Daily   feeding supplement  237 mL Oral BID BM   ferrous sulfate  324 mg Oral Q breakfast   furosemide  40 mg Intravenous BID   melatonin  10 mg Oral QHS   metoprolol succinate  50 mg Oral Daily   multivitamin with minerals  1 tablet Oral Daily   potassium chloride  40 mEq Oral Q4H   sacubitril-valsartan  1 tablet Oral BID   senna-docusate  1 tablet Oral Daily   Continuous Infusions:  sodium chloride 10 mL/hr at 06/26/22 1300   PRN Meds: acetaminophen, metoprolol tartrate, ondansetron, oxyCODONE, polyethylene glycol, senna-docusate   Vital Signs    Vitals:   06/27/22 2353 06/28/22 0500 06/28/22 0514 06/28/22 0805  BP: (!) 101/51  128/73 (!) 119/57  Pulse: (!) 57  (!) 59 (!) 44  Resp: 19  16 16   Temp: 98.7 F (37.1 C)  98.3 F (36.8 C)   TempSrc: Oral  Oral   SpO2: 98%  96% (!) 80%  Weight:  99.9 kg    Height:        Intake/Output Summary (Last 24 hours) at 06/28/2022 0825 Last data filed at 06/28/2022 0556 Gross per 24 hour  Intake 0 ml  Output 1500 ml  Net -1500 ml   Filed Weights   06/26/22 0603 06/27/22 0700 06/28/22 0500  Weight: 104.2 kg 104.2 kg 99.9 kg    Physical Exam   GEN: Well nourished, well developed, in no acute distress.  More talkative today. HEENT: Grossly normal.  Neck: Supple, no JVD, carotid bruits, or masses. Cardiac: RRR, no murmurs, rubs, or gallops. No clubbing, cyanosis, edema.  Radials 2+, DP/PT 2+ and equal bilaterally.   Respiratory:  Respirations regular and unlabored, clear to auscultation bilaterally. GI: Soft, nontender, nondistended, BS + x 4. MS: no deformity or atrophy. Skin: warm and dry, no rash. Neuro:  Strength and sensation are intact. Psych: Disoriented to time.  Normal affect.  Labs    Chemistry Recent Labs  Lab 06/21/22 1601 06/21/22 2301 06/26/22 0540 06/27/22 0514 06/28/22 0433  NA 141   < > 137 142 138  K 2.9*   < > 3.2* 3.3* 3.1*  CL 104   < > 105 103 98  CO2 22   < > 22 29 31   GLUCOSE 83   < > 102* 110* 110*  BUN 64*   < > 46* 35* 26*  CREATININE 3.07*   < > 1.22* 1.21* 1.07*  CALCIUM 8.3*   < > 7.9* 8.2* 8.1*  PROT 6.8  --   --   --   --   ALBUMIN 3.6  --   --   --   --   AST 46*  --   --   --   --   ALT 12  --   --   --   --   ALKPHOS 134*  --   --   --   --  BILITOT 1.3*  --   --   --   --   GFRNONAA 15*   < > 46* 46* 54*  ANIONGAP 15   < > 10 10 9    < > = values in this interval not displayed.     Hematology Recent Labs  Lab 06/25/22 0622 06/26/22 0540 06/27/22 0514  WBC 8.7 10.9* 9.9  RBC 3.06* 2.89* 3.36*  HGB 9.5* 9.2* 10.4*  HCT 28.7* 27.2* 31.3*  MCV 93.8 94.1 93.2  MCH 31.0 31.8 31.0  MCHC 33.1 33.8 33.2  RDW 14.5 14.3 14.2  PLT 72* 114* 153    Cardiac Enzymes  Recent Labs  Lab 06/21/22 2301 06/21/22 2358  TROPONINIHS 85* 91*      BNP    Component Value Date/Time   BNP 767 (H) 04/19/2011 0444   DDimer  Recent Labs  Lab 06/21/22 2301  DDIMER 8.22*     Lipids  Lab Results  Component Value Date   CHOL 240 (H) 06/11/2018   HDL 79 06/11/2018   LDLCALC 150 (H) 06/11/2018   TRIG 53 06/11/2018   CHOLHDL 3.0 06/11/2018    HbA1c  Lab Results  Component Value Date   HGBA1C 5.0 06/29/2019    Radiology    US Abdomen Limited RUQ (LIVER/GB)  Result Date: 06/24/2022 CLINICAL DATA:  Abdominal pain EXAM: ULTRASOUND ABDOMEN LIMITED RIGHT UPPER QUADRANT COMPARISON:  None Available. FINDINGS: Gallbladder: Dilated gallbladder.  Borderline wall thickening. No shadowing stones. There is some sludge. There is some adjacent fluid as well to the gallbladder. Common bile duct: Diameter: 6 mm Liver: Nodular contours of the liver with mild ascites. Right-sided pleural effusion. Portal vein is patent on color Doppler imaging with normal direction of blood flow towards the liver. Other: None. IMPRESSION: Distended gallbladder with sludge.  No stones. There is some wall thickening and adjacent fluid although there is a nodular appearance of the liver with the ascites. Please correlate for chronic liver disease. Overall if there is further concern of acalculous cholecystitis with the dilatation and wall thickening, HIDA scan may be useful as the next step in the workup Electronically Signed   By: Karen Kays M.D.   On: 06/24/2022 12:05    Telemetry    RSR - Personally Reviewed  Cardiac Studies   2D Echocardiogram 06/27/2022   1. Left ventricular ejection fraction, by estimation, is 25 to 30%. Left  ventricular ejection fraction by 2D MOD biplane is 28.7 %. The left  ventricle has severely decreased function. The left ventricle demonstrates  global hypokinesis. Left ventricular  diastolic parameters are consistent with Grade II diastolic dysfunction  (pseudonormalization).   2. Right ventricular systolic function is mildly reduced. The right  ventricular size is normal. There is mildly elevated pulmonary artery  systolic pressure. The estimated right ventricular systolic pressure is  37.0 mmHg.   3. Left atrial size was moderately dilated.   4. A small pericardial effusion is present. There is no evidence of  cardiac tamponade. Moderate pleural effusion in the left lateral region.   5. The mitral valve is normal in structure. Moderate mitral valve  regurgitation. No evidence of mitral stenosis.   6. The aortic valve is normal in structure. Aortic valve regurgitation is  trivial. No aortic stenosis is present.   7. The  inferior vena cava is normal in size with greater than 50%  respiratory variability, suggesting right atrial pressure of 3 mmHg.   Patient Profile     77 year old woman with  history of dementia, hypertension, GERD, kidney stones, falls presenting to the hospital with sepsis, mental status changes, AKI/renal stone/hydronephrosis, hypotension, tachycardia and PAF.   Assessment & Plan    1.  PAF:  in setting of sepsis, AKI, nephrolithiasis/hydronephrosis s/p ureteral stenting, and dementia. Maintaining sinus on PO amio and metoprolol.  Has received 3.4 gram load thus far. QT prolonged on tele strip.  Will change amio to 200 daily starting tomorrow (no PM dose today).  F/u ECG in AM.  Eliquis started 5/10 (CHA2DS2VASc = 6).  2.  Acute systolic CHF:  Echo 5/9 w/ EF of 25-30%, GrII diast dysfxn, RVSP , small pericardial eff, mod MR, mod L lateral pleural effusion. IV lasix added 5/9 w/ good response.  No intake recorded 5/10.  1.5 L UO.  Wt down 4.3 kg over past 48 hrs. Renal fxn cont to improve (26/1.07).  K remains low w/ supplementation ordered.  Bicarb bumping sl.  Euvolemic on exam. Will change to lasix 40 daily starting tomorrow.  3.  Hypokalemia:  3.1 this AM  supplementation ordered.  Switching to PO to start tomorrow - should help.  4.  AKI/L hydronephrosis:  s/p stent placement.  Renal fxn cont to improve.  5.  Normocytic anemia:  stable.  6.  Prolonged QT:  reducing amio.  F/u ecg in am.  Supp K.  Mg nl.  Signed, Nicolasa Ducking, NP  06/28/2022, 8:25 AM    For questions or updates, please contact   Please consult [email or web address] for contact info under Cardiology/STEMI.

## 2022-06-28 NOTE — TOC Progression Note (Signed)
Transition of Care [facility]) - Progression Note    Patient Details  Name: ANNIKAH DELACUEVA MRN: [id number] Date of Birth: November 11, 1945  [facility] CM/SW Contact  Bing Quarry, RN Phone Number: 06/28/2022, 12:21 PM  Clinical Narrative:  06/28/22: Marcelino Duster at [facility]. [phone], contacted and re-confirned that patient can come over the weekend when medical ready for discharge. Updated provider, no dc orders in as yet. Notify Michelle when CM knows of dc and she will relay room and report call information at that time. DC Summary to be faxed and will need EMS transport. Gabriel Cirri RN CM   Contact information:  Christian, Hetzer (Sister)  [phone] (Mobile)          Expected Discharge Plan and Services                         DME Arranged: N/A DME Agency: NA         HH Agency: NA         Social Determinants of Health (SDOH) Interventions SDOH Screenings   Tobacco Use: Medium Risk (06/26/2022)    Readmission Risk Interventions     No data to display

## 2022-06-28 NOTE — Consult Note (Signed)
PHARMACY CONSULT NOTE  Pharmacy Consult for Electrolyte Monitoring and Replacement   Recent Labs: Potassium (mmol/L)  Date Value  06/28/2022 3.1 (L)  05/04/2013 3.7   Magnesium (mg/dL)  Date Value  16/11/9602 2.1  04/20/2011 1.9   Calcium (mg/dL)  Date Value  54/10/8117 8.1 (L)   Calcium, Total (mg/dL)  Date Value  14/78/2956 8.9   Albumin (g/dL)  Date Value  21/30/8657 3.6  01/06/2013 3.6   Phosphorus (mg/dL)  Date Value  84/69/6295 2.9   Sodium (mmol/L)  Date Value  06/28/2022 138  04/19/2011 139   Assessment: 77 y/o F with medical history including dementia, HTN, GERD, arthritis, Stevens-Johnson's syndrome (Bactrim), recurrent falls with recent right patella and left wrist fracture who was admitted 5/4 with septic shock secondary to UTI and new-onset Afib with RVR. Pharmacy consulted to assist with electrolyte monitoring and replacement as indicated.  Diuresis: IV Lasix 40 mg BID  Goal of Therapy:  K > 4, Mg > 2  Plan:  --K 3.1, Will order Kcl 40 mEq PO q4h x 2  Patient with ongoing diuresis-lasix 40 mg IV BID --Follow-up electrolytes with AM labs tomorrow  Zelta Enfield A 06/28/2022 7:41 AM

## 2022-06-28 NOTE — Progress Notes (Signed)
PROGRESS NOTE    Alison Price  ZOX:[id number] DOB: 1945/06/13 DOA: 06/21/2022 PCP: Alison Shaggy, MD   Brief Narrative:  This 77 yrs old  female with PMH significant of dementia, hypertension, GERD, arthritis, Stevens-Johnson's syndrome, recurrent falls with recent right patella and left wrist fracture who presented to the ED with chief complaints of altered mental status.  She was found to have severe sepsis secondary to UTI.  She was started on IV antibiotics.  CT renal study showed obstructing stone causing hydronephrosis. Urology is consulted,  Patient underwent cystoscopy with left ureteral stent placement.  Post procedure patient developed A-fib with RVR and was given a dose of metoprolol and she converted to normal sinus rhythm.  Cardiology is consulted. Echo showed LVEF 25-30%. Not a good candidate for ischemic evaluation given age and multiple co morbid conditions.   Assessment & Plan:   Principal Problem:   Severe sepsis with lactic acidosis (HCC) Active Problems:   Hydronephrosis with urinary obstruction due to renal calculus   Nephrolithiasis   AKI (acute kidney injury) (HCC)   Lower urinary tract infectious disease   Altered mental status   Lactic acidosis   PAF (paroxysmal atrial fibrillation) (HCC)   Dilated cardiomyopathy (HCC)  Severe sepsis /shock due to E. coli UTI: E. coli bacteremia / pneumonia: H/o Ureteral stone in 2022 (had cystoscopy with stent placement and h/o Lithotripsy): Patient presented with septic shock requiring IV fluids / vasopressors. Blood culture positive for E. Coli. Initiated on IV Rocephin , changed to Duricef 1g bid for 3 days on 5/10. She remains afebrile. Lactic acid normalized with IV hydration. Procalcitonin trended down 150 > 33.99 > 6.43. Patient currently on room air. No cough, Sepsis physiology improving.  Leukocytosis: Presented with WBC 4.8 trended up to 19.8--22K. Likely secondary to UTI, finished  antibiotics. Leucocytosis resolved.   Paroxysmal  New onset A. fib in the setting of sepsis >  Now resolved Heart rate now ranges between 57-70 Post procedure patient went into A-fib again with RVR , given a dose of metoprolol  She converted to normal sinus rhythm. She is not a candidate for anticoagulation given recurrent falls. Continue amiodarone 400 twice daily and metoprolol 25 mg bid. As per family she is not high fall risk.  She is started on Eliquis 5 mg twice daily.  Dilated cardiomyopathy: Echo shows LVEF 25 to 30%. Not a good candidate for ischemic workup given age and multiple comorbid conditions. Cardiology recommended continue IV Lasix twice daily. Consider ARB if renal functions improves. At discharge she will need Lasix 40 mg daily with potassium  AKI on chronic kidney disease stage IV: Baseline serum creatinine( 0.9--1.22 in sept 2022).  Creatinine back to baseline.  Left hydronephrosis : CT renal study shows obstructing stone causing hydronephrosis on left side Urology is consulted.  Patient underwent cystoscopy with left ureteral stent placement. Continue antibiotics and urology follow-up. Urology recommended 7 to 10 days of culture specific antibiotics and outpatient follow-up.   Hypomagnesimia Replaced. Resolved.   GERD: Continue Pantoprazole 40 mg daily.   Generalized weakness with deconditioning: PT and OT evaluation.   Dementia without behavioral problems: Sister Alison Price who is next of kin.  She reports patient has dementia. Patient lives with Alison Price at home. She is fairly independent. TOC for discharge planning   Hypokalemia: Replaced. Continue to monitor  DVT prophylaxis: Lovenox Code Status: DNR Family Communication: No family at bedside Disposition Plan:  Status is: Inpatient Remains inpatient appropriate because: Admitted for severe  sepsis secondary to E. coli bacteremia, UTI.  CT renal study shows kidney stone causing hydronephrosis.   Urology is consulted.  Continued on IV antibiotics.  Underwent cystoscopy with left ureteral stent placement.  A-fib with RVR.  Heart rate is now controlled started on Eliquis amiodarone.  Consultants:  Urology  Procedures: CT renal study.  Antimicrobials:  Anti-infectives (From admission, onward)    Start     Dose/Rate Route Frequency Ordered Stop   06/28/22 1000  cefadroxil (DURICEF) capsule 1,000 mg        1,000 mg Oral 2 times daily 06/27/22 0847 07/01/22 0959   06/22/22 1000  azithromycin (ZITHROMAX) 500 mg in sodium chloride 0.9 % 250 mL IVPB  Status:  Discontinued        500 mg 250 mL/hr over 60 Minutes Intravenous Every 24 hours 06/21/22 2251 06/22/22 0913   06/22/22 0800  cefTRIAXone (ROCEPHIN) 2 g in sodium chloride 0.9 % 100 mL IVPB        2 g 200 mL/hr over 30 Minutes Intravenous Every 24 hours 06/21/22 2251 06/27/22 1013   06/21/22 1700  ceFEPIme (MAXIPIME) 2 g in sodium chloride 0.9 % 100 mL IVPB        2 g 200 mL/hr over 30 Minutes Intravenous  Once 06/21/22 1652 06/21/22 1713   06/21/22 1700  vancomycin (VANCOCIN) IVPB 1000 mg/200 mL premix        1,000 mg 200 mL/hr over 60 Minutes Intravenous  Once 06/21/22 1652 06/21/22 1814       Subjective: Patient was seen and examined at bedside.  Overnight events noted.   Patient has baseline dementia.  She appears very deconditioned and weak. She follows commands and she is oriented to self only. Her heart rate remains in normal sinus rhythm.  Objective: Vitals:   06/28/22 0500 06/28/22 0514 06/28/22 0805 06/28/22 1000  BP:  128/73 (!) 119/57   Pulse:  (!) 59 (!) 44 (!) 56  Resp:  16 16   Temp:  98.3 F (36.8 C)    TempSrc:  Oral    SpO2:  96% (!) 80%   Weight: 99.9 kg     Height:        Intake/Output Summary (Last 24 hours) at 06/28/2022 1035 Last data filed at 06/28/2022 0556 Gross per 24 hour  Intake 0 ml  Output 1500 ml  Net -1500 ml   Filed Weights   06/26/22 0603 06/27/22 0700 06/28/22 0500  Weight:  104.2 kg 104.2 kg 99.9 kg    Examination:  General exam: Appears deconditioned, weak, comfortable, not in any acute distress. Respiratory system: CTA bilaterally, respiratory effort normal.  RR 15. Cardiovascular system: S1 & S2 heard, regular rate and rhythm, no murmur. Gastrointestinal system: Abdomen is soft, non tender, non distended, BS+ Central nervous system: Alert and oriented x 1. No focal neurological deficits. Extremities: No edema, no cyanosis, no clubbing. Skin: No rashes, lesions or ulcers Psychiatry: Mood & affect appropriate.     Data Reviewed: I have personally reviewed following labs and imaging studies  CBC: Recent Labs  Lab 06/21/22 1601 06/22/22 0502 06/23/22 0549 06/24/22 0421 06/25/22 0622 06/26/22 0540 06/27/22 0514  WBC 4.9   < > 22.2* 11.6* 8.7 10.9* 9.9  NEUTROABS 3.5  --   --   --   --   --   --   HGB 12.1   < > 9.7* 8.9* 9.5* 9.2* 10.4*  HCT 35.7*   < > 29.4* 25.9* 28.7* 27.2* 31.3*  MCV 93.2   < > 93.3 92.2 93.8 94.1 93.2  PLT 43*   < > 33* 40* 72* 114* 153   < > = values in this interval not displayed.   Basic Metabolic Panel: Recent Labs  Lab 06/22/22 0502 06/23/22 1610 06/24/22 0421 06/25/22 0622 06/26/22 0540 06/27/22 0514 06/28/22 0433  NA 136   < > 139 141 137 142 138  K 4.1   < > 3.5 3.5 3.2* 3.3* 3.1*  CL 104   < > 107 109 105 103 98  CO2 20*   < > 21* 23 22 29 31   GLUCOSE 93   < > 92 94 102* 110* 110*  BUN 64*   < > 68* 63* 46* 35* 26*  CREATININE 2.68*   < > 1.93* 1.62* 1.22* 1.21* 1.07*  CALCIUM 8.0*   < > 8.2* 8.2* 7.9* 8.2* 8.1*  MG 1.2*  --  2.1 1.9 1.6* 1.3* 2.1  PHOS 3.9  --   --  3.8 3.7 3.5 2.9   < > = values in this interval not displayed.   GFR: Estimated Creatinine Clearance: 57.3 mL/min (A) (by C-G formula based on SCr of 1.07 mg/dL (H)). Liver Function Tests: Recent Labs  Lab 06/21/22 1601  AST 46*  ALT 12  ALKPHOS 134*  BILITOT 1.3*  PROT 6.8  ALBUMIN 3.6   No results for input(s): "LIPASE",  "AMYLASE" in the last 168 hours. No results for input(s): "AMMONIA" in the last 168 hours. Coagulation Profile: No results for input(s): "INR", "PROTIME" in the last 168 hours. Cardiac Enzymes: No results for input(s): "CKTOTAL", "CKMB", "CKMBINDEX", "TROPONINI" in the last 168 hours. BNP (last 3 results) No results for input(s): "PROBNP" in the last 8760 hours. HbA1C: No results for input(s): "HGBA1C" in the last 72 hours. CBG: Recent Labs  Lab 06/21/22 2243  GLUCAP 92   Lipid Profile: No results for input(s): "CHOL", "HDL", "LDLCALC", "TRIG", "CHOLHDL", "LDLDIRECT" in the last 72 hours. Thyroid Function Tests: No results for input(s): "TSH", "T4TOTAL", "FREET4", "T3FREE", "THYROIDAB" in the last 72 hours.  Anemia Panel: No results for input(s): "VITAMINB12", "FOLATE", "FERRITIN", "TIBC", "IRON", "RETICCTPCT" in the last 72 hours. Sepsis Labs: Recent Labs  Lab 06/21/22 1601 06/21/22 1947 06/21/22 2358 06/25/22 0622 06/27/22 0819  PROCALCITON >150.00  --   --  33.99 6.43  LATICACIDVEN 4.4* 5.5* 2.7* 1.2  --     Recent Results (from the past 240 hour(s))  Blood Culture (routine x 2)     Status: Abnormal   Collection Time: 06/21/22  4:00 PM   Specimen: BLOOD RIGHT FOREARM  Result Value Ref Range Status   Specimen Description   Final    BLOOD RIGHT FOREARM Performed at [facility], [address] [place], [place] [address]    Special Requests   Final    BOTTLES DRAWN AEROBIC AND ANAEROBIC Blood Culture adequate volume Performed at [facility], [address] [place], [place] [address]    Culture  Setup Time   Final    IN BOTH AEROBIC AND ANAEROBIC BOTTLES GRAM NEGATIVE RODS CRITICAL RESULT CALLED TO, READ BACK BY AND VERIFIED WITHDawayne Cirri @ 1914 06/22/22 LFD Performed at [facility], [address], [place], [place] [address]    Culture ESCHERICHIA COLI (A)  Final   Report Status 06/24/2022 FINAL  Final   Organism ID, Bacteria  ESCHERICHIA COLI  Final      Susceptibility   Escherichia coli -  MIC*    AMPICILLIN 8 SENSITIVE Sensitive     CEFEPIME <=0.12 SENSITIVE Sensitive     CEFTAZIDIME <=1 SENSITIVE Sensitive     CEFTRIAXONE <=0.25 SENSITIVE Sensitive     CIPROFLOXACIN <=0.25 SENSITIVE Sensitive     GENTAMICIN <=1 SENSITIVE Sensitive     IMIPENEM <=0.25 SENSITIVE Sensitive     TRIMETH/SULFA <=20 SENSITIVE Sensitive     AMPICILLIN/SULBACTAM 4 SENSITIVE Sensitive     PIP/TAZO <=4 SENSITIVE Sensitive     * ESCHERICHIA COLI  Blood Culture ID Panel (Reflexed)     Status: Abnormal   Collection Time: 06/21/22  4:00 PM  Result Value Ref Range Status   Enterococcus faecalis NOT DETECTED NOT DETECTED Final   Enterococcus Faecium NOT DETECTED NOT DETECTED Final   Listeria monocytogenes NOT DETECTED NOT DETECTED Final   Staphylococcus species NOT DETECTED NOT DETECTED Final   Staphylococcus aureus (BCID) NOT DETECTED NOT DETECTED Final   Staphylococcus epidermidis NOT DETECTED NOT DETECTED Final   Staphylococcus lugdunensis NOT DETECTED NOT DETECTED Final   Streptococcus species NOT DETECTED NOT DETECTED Final   Streptococcus agalactiae NOT DETECTED NOT DETECTED Final   Streptococcus pneumoniae NOT DETECTED NOT DETECTED Final   Streptococcus pyogenes NOT DETECTED NOT DETECTED Final   A.calcoaceticus-baumannii NOT DETECTED NOT DETECTED Final   Bacteroides fragilis NOT DETECTED NOT DETECTED Final   Enterobacterales DETECTED (A) NOT DETECTED Final    Comment: Enterobacterales represent a large order of gram negative bacteria, not a single organism. CRITICAL RESULT CALLED TO, READ BACK BY AND VERIFIED WITH: NATHAN BELUE @ 0519 06/22/22 LFD    Enterobacter cloacae complex NOT DETECTED NOT DETECTED Final   Escherichia coli DETECTED (A) NOT DETECTED Final    Comment: CRITICAL RESULT CALLED TO, READ BACK BY AND VERIFIED WITH: NATHAN BELUE @ 0519 06/22/22 LFD    Klebsiella aerogenes NOT DETECTED NOT DETECTED Final    Klebsiella oxytoca NOT DETECTED NOT DETECTED Final   Klebsiella pneumoniae NOT DETECTED NOT DETECTED Final   Proteus species NOT DETECTED NOT DETECTED Final   Salmonella species NOT DETECTED NOT DETECTED Final   Serratia marcescens NOT DETECTED NOT DETECTED Final   Haemophilus influenzae NOT DETECTED NOT DETECTED Final   Neisseria meningitidis NOT DETECTED NOT DETECTED Final   Pseudomonas aeruginosa NOT DETECTED NOT DETECTED Final   Stenotrophomonas maltophilia NOT DETECTED NOT DETECTED Final   Candida albicans NOT DETECTED NOT DETECTED Final   Candida auris NOT DETECTED NOT DETECTED Final   Candida glabrata NOT DETECTED NOT DETECTED Final   Candida krusei NOT DETECTED NOT DETECTED Final   Candida parapsilosis NOT DETECTED NOT DETECTED Final   Candida tropicalis NOT DETECTED NOT DETECTED Final   Cryptococcus neoformans/gattii NOT DETECTED NOT DETECTED Final   CTX-M ESBL NOT DETECTED NOT DETECTED Final   Carbapenem resistance IMP NOT DETECTED NOT DETECTED Final   Carbapenem resistance KPC NOT DETECTED NOT DETECTED Final   Carbapenem resistance NDM NOT DETECTED NOT DETECTED Final   Carbapenem resist OXA 48 LIKE NOT DETECTED NOT DETECTED Final   Carbapenem resistance VIM NOT DETECTED NOT DETECTED Final    Comment: Performed at [facility], [address] [place], [place] [address]  Blood Culture (routine x 2)     Status: Abnormal   Collection Time: 06/21/22  4:01 PM   Specimen: BLOOD  Result Value Ref Range Status   Specimen Description   Final    BLOOD RIGHT ANTECUBITAL Performed at [facility], [address] [place], [place] [address]  Special Requests   Final    BOTTLES DRAWN AEROBIC AND ANAEROBIC Blood Culture results may not be optimal due to an excessive volume of blood received in culture bottles Performed at [facility], [address] [place], [place] [address]    Culture  Setup Time   Final    GRAM NEGATIVE RODS IN BOTH AEROBIC AND  ANAEROBIC BOTTLES CRITICAL RESULT CALLED TO, READ BACK BY AND VERIFIED WITH: Dawayne Cirri 9147 06/22/22 LFD Performed at [facility], [address] [place], [place] [address]    Culture (A)  Final    ESCHERICHIA COLI SUSCEPTIBILITIES PERFORMED ON PREVIOUS CULTURE WITHIN THE LAST 5 DAYS. Performed at [facility], [address], [place], [place] [address]    Report Status 06/24/2022 FINAL  Final  Resp panel by RT-PCR (RSV, Flu A&B, Covid)     Status: None   Collection Time: 06/21/22  4:01 PM  Result Value Ref Range Status   SARS Coronavirus 2 by RT PCR NEGATIVE NEGATIVE Final    Comment: (NOTE) SARS-CoV-2 target nucleic acids are NOT DETECTED.  The SARS-CoV-2 RNA is generally detectable in upper respiratory specimens during the acute phase of infection. The lowest concentration of SARS-CoV-2 viral copies this assay can detect is 138 copies/mL. A negative result does not preclude SARS-Cov-2 infection and should not be used as the sole basis for treatment or other patient management decisions. A negative result may occur with  improper specimen collection/handling, submission of specimen other than nasopharyngeal swab, presence of viral mutation(s) within the areas targeted by this assay, and inadequate number of viral copies(<138 copies/mL). A negative result must be combined with clinical observations, patient history, and epidemiological information. The expected result is Negative.  Fact Sheet for Patients:  BloggerCourse.com  Fact Sheet for Healthcare Providers:  SeriousBroker.it  This test is no t yet approved or cleared by the [place] FDA and  has been authorized for detection and/or diagnosis of SARS-CoV-2 by FDA under an Emergency Use Authorization (EUA). This EUA will remain  in effect (meaning this test can be used) for the duration of the COVID-19 declaration under Section 564(b)(1) of the  Act, 21 U.S.C.section 360bbb-3(b)(1), unless the authorization is terminated  or revoked sooner.       Influenza A by PCR NEGATIVE NEGATIVE Final   Influenza B by PCR NEGATIVE NEGATIVE Final    Comment: (NOTE) The Xpert Xpress SARS-CoV-2/FLU/RSV plus assay is intended as an aid in the diagnosis of influenza from Nasopharyngeal swab specimens and should not be used as a sole basis for treatment. Nasal washings and aspirates are unacceptable for Xpert Xpress SARS-CoV-2/FLU/RSV testing.  Fact Sheet for Patients: BloggerCourse.com  Fact Sheet for Healthcare Providers: SeriousBroker.it  This test is not yet approved or cleared by the [place] FDA and has been authorized for detection and/or diagnosis of SARS-CoV-2 by FDA under an Emergency Use Authorization (EUA). This EUA will remain in effect (meaning this test can be used) for the duration of the COVID-19 declaration under Section 564(b)(1) of the Act, 21 U.S.C. section 360bbb-3(b)(1), unless the authorization is terminated or revoked.     Resp Syncytial Virus by PCR NEGATIVE NEGATIVE Final    Comment: (NOTE) Fact Sheet for Patients: BloggerCourse.com  Fact Sheet for Healthcare Providers: SeriousBroker.it  This test is not yet approved or cleared by the [place] FDA and has been authorized for detection and/or diagnosis of SARS-CoV-2 by FDA under an Emergency Use Authorization (EUA). This EUA will  remain in effect (meaning this test can be used) for the duration of the COVID-19 declaration under Section 564(b)(1) of the Act, 21 U.S.C. section 360bbb-3(b)(1), unless the authorization is terminated or revoked.  Performed at [facility], [address], [place], [place] [address]   Urine Culture     Status: Abnormal   Collection Time: 06/21/22  7:47 PM   Specimen: Urine, Random  Result Value Ref Range  Status   Specimen Description   Final    URINE, RANDOM Performed at [facility], [address] [place], [place] [address]    Special Requests   Final    NONE Reflexed from [phone] Performed at [facility], [address] [place], [place] [address]    Culture MULTIPLE SPECIES PRESENT, SUGGEST RECOLLECTION (A)  Final   Report Status 06/23/2022 FINAL  Final  MRSA Next Gen by PCR, Nasal     Status: Abnormal   Collection Time: 06/21/22 11:02 PM   Specimen: Nasal Mucosa; Nasal Swab  Result Value Ref Range Status   MRSA by PCR Next Gen DETECTED (A) NOT DETECTED Final    Comment: CRITICAL RESULT CALLED TO, READ BACK BY AND VERIFIED WITH: SCRIVNER,IVAN RN @ 0210 06/22/22 LFD Performed at [facility], [address] [place], [place] [address]   Urine Culture     Status: Abnormal   Collection Time: 06/25/22  7:54 AM   Specimen: Urine, Cystoscope  Result Value Ref Range Status   Specimen Description   Final    KIDNEY Performed at [facility], [address], [place], [place] [address]    Special Requests   Final    URINE FROM LEFT RENAL PELVIS Performed at [facility], [address] [place], [place] [address]    Culture 10,000 COLONIES/mL ESCHERICHIA COLI (A)  Final   Report Status 06/27/2022 FINAL  Final   Organism ID, Bacteria ESCHERICHIA COLI (A)  Final      Susceptibility   Escherichia coli - MIC*    AMPICILLIN 8 SENSITIVE Sensitive     CEFEPIME <=0.12 SENSITIVE Sensitive     CEFTAZIDIME <=1 SENSITIVE Sensitive     CEFTRIAXONE <=0.25 SENSITIVE Sensitive     CIPROFLOXACIN <=0.25 SENSITIVE Sensitive     GENTAMICIN <=1 SENSITIVE Sensitive     IMIPENEM <=0.25 SENSITIVE Sensitive     TRIMETH/SULFA <=20 SENSITIVE Sensitive     AMPICILLIN/SULBACTAM 4 SENSITIVE Sensitive     PIP/TAZO <=4 SENSITIVE Sensitive     * 10,000 COLONIES/mL ESCHERICHIA COLI    Radiology Studies: No results found.  Scheduled Meds:  [START ON  07/02/2022] amiodarone  200 mg Oral BID   amiodarone  400 mg Oral BID   apixaban  5 mg Oral BID   cefadroxil  1,000 mg Oral BID   cholecalciferol  2,000 Units Oral Daily   donepezil  10 mg Oral QHS   escitalopram  5 mg Oral Daily   feeding supplement  237 mL Oral BID BM   ferrous sulfate  324 mg Oral Q breakfast   furosemide  40 mg Intravenous BID   melatonin  10 mg Oral QHS   metoprolol succinate  50 mg Oral Daily   multivitamin with minerals  1 tablet Oral Daily   potassium chloride  40 mEq Oral Q4H   sacubitril-valsartan  1 tablet Oral BID   senna-docusate  1 tablet Oral Daily   Continuous Infusions:  sodium chloride 10 mL/hr at 06/26/22 1300  LOS: 7 days    Time spent: 35 mins    Willeen Niece, MD [facility]   If 7PM-7AM, please contact night-coverage

## 2022-06-29 DIAGNOSIS — A419 Sepsis, unspecified organism: Secondary | ICD-10-CM | POA: Diagnosis not present

## 2022-06-29 DIAGNOSIS — R6520 Severe sepsis without septic shock: Secondary | ICD-10-CM | POA: Diagnosis not present

## 2022-06-29 DIAGNOSIS — E872 Acidosis, unspecified: Secondary | ICD-10-CM | POA: Diagnosis not present

## 2022-06-29 LAB — BASIC METABOLIC PANEL
Anion gap: 13 (ref 5–15)
BUN: 24 mg/dL — ABNORMAL HIGH (ref 8–23)
CO2: 26 mmol/L (ref 22–32)
Calcium: 7.7 mg/dL — ABNORMAL LOW (ref 8.9–10.3)
Chloride: 98 mmol/L (ref 98–111)
Creatinine, Ser: 1.02 mg/dL — ABNORMAL HIGH (ref 0.44–1.00)
GFR, Estimated: 57 mL/min — ABNORMAL LOW (ref >60)
Glucose, Bld: 98 mg/dL (ref 70–99)
Potassium: 3.8 mmol/L (ref 3.5–5.1)
Sodium: 137 mmol/L (ref 135–145)

## 2022-06-29 LAB — CBC
HCT: 28.3 % — ABNORMAL LOW (ref 36.0–46.0)
Hemoglobin: 9.6 g/dL — ABNORMAL LOW (ref 12.0–15.0)
MCH: 31.2 pg (ref 26.0–34.0)
MCHC: 33.9 g/dL (ref 30.0–36.0)
MCV: 91.9 fL (ref 80.0–100.0)
Platelets: 215 10*3/uL (ref 150–400)
RBC: 3.08 MIL/uL — ABNORMAL LOW (ref 3.87–5.11)
RDW: 14.4 % (ref 11.5–15.5)
WBC: 6.6 10*3/uL (ref 4.0–10.5)
nRBC: 0 % (ref 0.0–0.2)

## 2022-06-29 LAB — GLUCOSE, CAPILLARY: Glucose-Capillary: 91 mg/dL (ref 70–99)

## 2022-06-29 MED ORDER — METOPROLOL SUCCINATE ER 50 MG PO TB24: 50.0000 mg | ORAL_TABLET | Freq: Every day | ORAL | 1 refills | Status: AC

## 2022-06-29 MED ORDER — FUROSEMIDE 40 MG PO TABS: 40.0000 mg | ORAL_TABLET | Freq: Every day | ORAL | 1 refills | Status: DC

## 2022-06-29 MED ORDER — AMIODARONE HCL 200 MG PO TABS: 200.0000 mg | ORAL_TABLET | Freq: Every day | ORAL | 1 refills | Status: DC

## 2022-06-29 MED ORDER — APIXABAN 5 MG PO TABS: 5.0000 mg | ORAL_TABLET | Freq: Two times a day (BID) | ORAL | 2 refills | Status: DC

## 2022-06-29 MED ORDER — CEFADROXIL 500 MG PO CAPS: 1000.0000 mg | ORAL_CAPSULE | Freq: Two times a day (BID) | ORAL | 0 refills | Status: AC

## 2022-06-29 MED ORDER — SACUBITRIL-VALSARTAN 24-26 MG PO TABS: 1.0000 | ORAL_TABLET | Freq: Two times a day (BID) | ORAL | 1 refills | Status: AC

## 2022-06-29 NOTE — Discharge Instructions (Signed)
Advised to follow-up with primary care physician in 1 week. Advised to follow-up with cardiology as scheduled. Advised to follow-up with urology for outpatient follow-up. Advised to take cefadroxil twice daily for next 2 days to complete treatment for E. coli bacteremia.

## 2022-06-29 NOTE — TOC Progression Note (Addendum)
Transition of Care [facility]) - Progression Note    Patient Details  Name: FAISA ROBERDS MRN: [id number] Date of Birth: Aug 30, 1945  [facility] CM/SW Contact  Bing Quarry, RN Phone Number: 06/29/2022, 9:13 AM  Clinical Narrative:  06/28/22: Left message for [facility] at [place] [facility], per our conversation on 06/28/22 that patient could be admitted to facility over the weekend when medically ready and that Provider indicates patient is ready for discharge today. Waiting on return answer with room number and report call numbers. Updated provider via secure chat. [facility] forms printed to Unit excluding DNR forms in anticipation of admission/transport some time today.  [address] [place], Marcelino Duster at [phone].  Gabriel Cirri RN CM [phone] (Weekends only).  130 pm. Called and left a confidential VM with Marcelino Duster, the [facility] to please, per our conversation on 06/28/22, let RN CM know if patient can return today or not. Call back number given as well as text sent per request yesterday. Trying to confirm if today or tomorrow. Updated provider. Gabriel Cirri RN CM        Expected Discharge Plan and Services                         DME Arranged: N/A DME Agency: NA         HH Agency: NA         Social Determinants of Health (SDOH) Interventions SDOH Screenings   Tobacco Use: Medium Risk (06/26/2022)    Readmission Risk Interventions     No data to display

## 2022-06-29 NOTE — Discharge Summary (Incomplete)
Physician Discharge Summary  GRACIA SALKOWSKI ZOX:[id number] DOB: 04/24/1945 DOA: 06/21/2022  PCP: Jaclyn Shaggy, MD  Admit date: 06/21/2022  Discharge date: 06/30/2022  Admitted From: Home.  Disposition: Skilled Nursing Facility  Recommendations for Outpatient Follow-up:  Follow up with PCP in 1-2 weeks. Please obtain BMP/CBC in one week. Advised to follow-up with Cardiology as scheduled. Advised to follow-up with Urology for outpatient follow-up. Advised to take cefadroxil twice daily for next 2 days to complete treatment for E. coli bacteremia.  Home Health: None Equipment/Devices: None  Discharge Condition: Stable CODE STATUS: DNR Diet recommendation: Heart Healthy   Brief Summary/ Hospital Course: This 77 yrs old  female with PMH significant of dementia, hypertension, GERD, arthritis, Stevens-Johnson's syndrome, recurrent falls with recent right patella and left wrist fracture who presented to the ED with chief complaints of altered mental status.  She was found to have severe sepsis secondary to UTI.  She was started on IV antibiotics.  CT renal study showed obstructing stone causing hydronephrosis. Urology was consulted,  Patient underwent cystoscopy with left ureteral stent placement.  Post procedure patient developed A-fib with RVR and was given a dose of metoprolol and she converted to normal sinus rhythm.  Cardiology was consulted. Echo showed LVEF 25-30%. Not a good candidate for ischemic evaluation given age and multiple co morbid conditions.  Patient was started on amiodarone heart rate remained well-controlled.  Cardiologist recommended to continue amiodarone 200 mg daily, she is also started on Entresto and metoprolol.  Patient has been tolerating well.  Patient feels much improved, cardiology signed off.  Patient is being discharged to skilled nursing facility for rehab.  Discharge Diagnoses:  Principal Problem:   Severe sepsis with lactic acidosis (HCC) Active  Problems:   Hydronephrosis with urinary obstruction due to renal calculus   Nephrolithiasis   AKI (acute kidney injury) (HCC)   Lower urinary tract infectious disease   Altered mental status   Lactic acidosis   PAF (paroxysmal atrial fibrillation) (HCC)   Dilated cardiomyopathy (HCC)  Severe sepsis /shock due to E. coli UTI: E. coli bacteremia / pneumonia: H/o Ureteral stone in 2022 (had cystoscopy with stent placement and h/o Lithotripsy): Patient presented with septic shock requiring IV fluids / vasopressors. Blood culture positive for E. Coli. Initiated on IV Rocephin , changed to Duricef 1g bid for 3 days on 5/10. She remains afebrile. Lactic acid normalized with IV hydration. Procalcitonin trended down 150 > 33.99 > 6.43. Patient currently on room air. No cough, Sepsis physiology improving. Complete course of Duricef 1 g 3 times daily.(Last day 06/30/2022)   Leukocytosis: Presented with WBC 4.8 trended up to 19.8--22K. Likely secondary to UTI, finished antibiotics. Leucocytosis resolved.   Paroxysmal  New onset A. fib in the setting of sepsis >  Now resolved Heart rate now ranges between 57-70 Post procedure patient went into A-fib again with RVR , given a dose of metoprolol  She converted to normal sinus rhythm. She is not a candidate for anticoagulation given recurrent falls. Continue amiodarone 400 twice daily and metoprolol 25 mg bid. As per family she is not high fall risk.  She is started on Eliquis 5 mg twice daily. Amiodarone dose reduced to 200 mg daily,. Continue amiodarone, Eliquis, metoprolol.   Dilated cardiomyopathy: Echo shows LVEF 25 to 30%. Not a good candidate for ischemic workup given age and multiple comorbid conditions. Cardiology recommended continue IV Lasix twice daily. Consider ARB if renal functions improves. At discharge she will need Lasix 40  mg daily with potassium 20 mg daily.   AKI on chronic kidney disease stage IV: Baseline serum  creatinine( 0.9--1.22 in sept 2022).  Creatinine back to baseline.   Left hydronephrosis : CT renal study shows obstructing stone causing hydronephrosis on left side Urology is consulted.  Patient underwent cystoscopy with left ureteral stent placement. Continue antibiotics and urology follow-up. Urology recommended 7 to 10 days of culture specific antibiotics and outpatient follow-up.   Hypomagnesimia Replaced. Resolved.   GERD: Continue Pantoprazole 40 mg daily.   Generalized weakness with deconditioning: PT and OT evaluation.   Dementia without behavioral problems: Sister [name] who is next of kin.  She reports patient has dementia. Patient lives with [name] at home. She is fairly independent. TOC for discharge planning   Hypokalemia: Replaced.   Discharge Instructions  Discharge Instructions     Call MD for:  difficulty breathing, headache or visual disturbances   Complete by: As directed    Call MD for:  persistant nausea and vomiting   Complete by: As directed    Diet - low sodium heart healthy   Complete by: As directed    Diet - low sodium heart healthy   Complete by: As directed    Diet Carb Modified   Complete by: As directed    Discharge instructions   Complete by: As directed    Advised to follow-up with primary care physician in 1 week. Advised to follow-up with cardiology as scheduled. Advised to follow-up with urology for outpatient follow-up. Advised to take cefadroxil twice daily for next 2 days to complete treatment for E. coli bacteremia.   Increase activity slowly   Complete by: As directed    Increase activity slowly   Complete by: As directed       Allergies as of 06/30/2022       Reactions   Codeine Nausea And Vomiting   Sulfa Antibiotics Other (See Comments)   STEVEN JOHNSON SYNDROME        Medication List     STOP taking these medications    lisinopril 10 MG tablet Commonly known as: ZESTRIL       TAKE these medications     acetaminophen 325 MG tablet Commonly known as: TYLENOL Take 2 tablets (650 mg total) by mouth every 6 (six) hours.   amiodarone 200 MG tablet Commonly known as: PACERONE Take 1 tablet (200 mg total) by mouth daily.   apixaban 5 MG Tabs tablet Commonly known as: ELIQUIS Take 1 tablet (5 mg total) by mouth 2 (two) times daily.   cefadroxil 500 MG capsule Commonly known as: DURICEF Take 2 capsules (1,000 mg total) by mouth 2 (two) times daily for 2 days.   cholecalciferol 25 MCG (1000 UNIT) tablet Commonly known as: VITAMIN D3 Take 2,000 Units by mouth daily.   donepezil 10 MG tablet Commonly known as: ARICEPT Take 10 mg by mouth daily.   escitalopram 5 MG tablet Commonly known as: LEXAPRO Take 5 mg by mouth daily.   ferrous sulfate 324 MG Tbec Take 324 mg by mouth daily with breakfast.   furosemide 40 MG tablet Commonly known as: LASIX Take 1 tablet (40 mg total) by mouth daily.   Melatonin 10 MG Tabs Take 10 mg by mouth at bedtime.   metoprolol succinate 50 MG 24 hr tablet Commonly known as: TOPROL-XL Take 1 tablet (50 mg total) by mouth daily. Take with or immediately following a meal.   multivitamin tablet Take 1 tablet by mouth daily.  ondansetron 4 MG tablet Commonly known as: ZOFRAN Take 4 mg by mouth every 8 (eight) hours as needed for nausea or vomiting. Takes 1-2 times daily with pain medication   Oxycodone HCl 10 MG Tabs Take 1 tablet (10 mg total) by mouth 3 (three) times daily as needed.   potassium chloride SA 20 MEQ tablet Commonly known as: KLOR-CON M Take 1 tablet (20 mEq total) by mouth daily.   sacubitril-valsartan 24-26 MG Commonly known as: ENTRESTO Take 1 tablet by mouth 2 (two) times daily.   sennosides-docusate sodium 8.6-50 MG tablet Commonly known as: SENOKOT-S Take 1 tablet by mouth daily. Takes with pain medication        Contact information for follow-up providers     Jaclyn Shaggy, MD Follow up in 1 week(s).    Specialty: Internal Medicine Contact information: [address]   [place] [address] [phone]         Riki Altes, MD Follow up in 1 week(s).   Specialty: Urology Contact information: [address] [place] [address] [phone]              Contact information for after-discharge care     Destination     [facility] .   Service: Skilled Nursing Contact information: [address] [place] [address] [phone]                    Allergies  Allergen Reactions   Codeine Nausea And Vomiting   Sulfa Antibiotics Other (See Comments)    Trudie Buckler SYNDROME    Consultations: Cardiology Urology   Procedures/Studies: ECHOCARDIOGRAM COMPLETE  Result Date: 06/26/2022    ECHOCARDIOGRAM REPORT   Patient Name:   Daniela A Quach Date of Exam: 06/26/2022 Medical Rec #:  [id number]           Height:       70.0 in Accession #:    [id number]          Weight:       229.7 lb Date of Birth:  04-07-1945           BSA:          2.214 m Patient Age:    77 years            BP:           137/92 mmHg Patient Gender: F                   HR:           69 bpm. Exam Location:  [facility] Procedure: 2D Echo, Cardiac Doppler and Color Doppler Indications:     Atrial Fibrillation I48.9  History:         Patient has no prior history of Echocardiogram examinations.                  Signs/Symptoms:Dyspnea; Risk Factors:Hypertension. Pneumonia.  Sonographer:     Cristela Blue Referring Phys:  [id number] Antonieta Iba Diagnosing Phys: Julien Nordmann MD IMPRESSIONS  1. Left ventricular ejection fraction, by estimation, is 25 to 30%. Left ventricular ejection fraction by 2D MOD biplane is 28.7 %. The left ventricle has severely decreased function. The left ventricle demonstrates global hypokinesis. Left ventricular diastolic parameters are consistent with Grade II diastolic dysfunction  (pseudonormalization).  2. Right ventricular systolic function is mildly reduced. The right ventricular size is normal. There  is mildly elevated pulmonary artery systolic pressure. The estimated right ventricular systolic pressure is 37.0 mmHg.  3. Left atrial size was moderately dilated.  4. A small pericardial effusion is present. There is no evidence of cardiac tamponade. Moderate pleural effusion in the left lateral region.  5. The mitral valve is normal in structure. Moderate mitral valve regurgitation. No evidence of mitral stenosis.  6. The aortic valve is normal in structure. Aortic valve regurgitation is trivial. No aortic stenosis is present.  7. The inferior vena cava is normal in size with greater than 50% respiratory variability, suggesting right atrial pressure of 3 mmHg. FINDINGS  Left Ventricle: Left ventricular ejection fraction, by estimation, is 25 to 30%. Left ventricular ejection fraction by 2D MOD biplane is 28.7 %. The left ventricle has severely decreased function. The left ventricle demonstrates global hypokinesis. The left ventricular internal cavity size was normal in size. There is no left ventricular hypertrophy. Left ventricular diastolic parameters are consistent with Grade II diastolic dysfunction (pseudonormalization). Right Ventricle: The right ventricular size is normal. No increase in right ventricular wall thickness. Right ventricular systolic function is mildly reduced. There is mildly elevated pulmonary artery systolic pressure. The tricuspid regurgitant velocity  is 2.60 m/s, and with an assumed right atrial pressure of 10 mmHg, the estimated right ventricular systolic pressure is 37.0 mmHg. Left Atrium: Left atrial size was moderately dilated. Right Atrium: Right atrial size was normal in size. Pericardium: A small pericardial effusion is present. There is no evidence of cardiac tamponade. Mitral Valve: The mitral valve is normal in structure. Moderate mitral valve  regurgitation. No evidence of mitral valve stenosis. MV peak gradient, 3.9 mmHg. The mean mitral valve gradient is 1.0 mmHg. Tricuspid Valve: The tricuspid valve is normal in structure. Tricuspid valve regurgitation is mild . No evidence of tricuspid stenosis. Aortic Valve: The aortic valve is normal in structure. Aortic valve regurgitation is trivial. No aortic stenosis is present. Aortic valve mean gradient measures 2.0 mmHg. Aortic valve peak gradient measures 3.7 mmHg. Aortic valve area, by VTI measures 2.73 cm. Pulmonic Valve: The pulmonic valve was normal in structure. Pulmonic valve regurgitation is not visualized. No evidence of pulmonic stenosis. Aorta: The aortic root is normal in size and structure. Venous: The inferior vena cava is normal in size with greater than 50% respiratory variability, suggesting right atrial pressure of 3 mmHg. IAS/Shunts: No atrial level shunt detected by color flow Doppler. Additional Comments: There is a moderate pleural effusion in the left lateral region.  LEFT VENTRICLE PLAX 2D                        Biplane EF (MOD) LVIDd:         4.40 cm         LV Biplane EF:   Left LVIDs:         3.50 cm                          ventricular LV PW:         1.70 cm                          ejection LV IVS:        1.20 cm                          fraction by LVOT  diam:     2.00 cm                          2D MOD LV SV:         45                               biplane is LV SV Index:   20                               28.7 %. LVOT Area:     3.14 cm                                Diastology                                LV e' medial:    4.57 cm/s LV Volumes (MOD)               LV E/e' medial:  21.9 LV vol d, MOD    94.9 ml       LV e' lateral:   6.31 cm/s A2C:                           LV E/e' lateral: 15.8 LV vol d, MOD    113.0 ml A4C: LV vol s, MOD    67.4 ml A2C: LV vol s, MOD    70.9 ml A4C: LV SV MOD A2C:   27.5 ml LV SV MOD A4C:   113.0 ml LV SV MOD BP:    28.4 ml RIGHT VENTRICLE  RV Basal diam:  4.50 cm RV Mid diam:    3.30 cm RV S prime:     11.70 cm/s TAPSE (M-mode): 2.2 cm LEFT ATRIUM             Index        RIGHT ATRIUM           Index LA diam:        4.80 cm 2.17 cm/m   RA Area:     17.90 cm LA Vol (A2C):   33.6 ml 15.18 ml/m  RA Volume:   48.90 ml  22.09 ml/m LA Vol (A4C):   46.1 ml 20.82 ml/m LA Biplane Vol: 40.8 ml 18.43 ml/m  AORTIC VALVE AV Area (Vmax):    2.31 cm AV Area (Vmean):   2.13 cm AV Area (VTI):     2.73 cm AV Vmax:           96.60 cm/s AV Vmean:          65.000 cm/s AV VTI:            0.166 m AV Peak Grad:      3.7 mmHg AV Mean Grad:      2.0 mmHg LVOT Vmax:         71.10 cm/s LVOT Vmean:        44.100 cm/s LVOT VTI:          0.144 m LVOT/AV VTI ratio: 0.87  AORTA Ao Root diam: 3.10 cm MITRAL VALVE  TRICUSPID VALVE MV Area (PHT): 6.07 cm     TR Peak grad:   27.0 mmHg MV Area VTI:   2.14 cm     TR Vmax:        260.00 cm/s MV Peak grad:  3.9 mmHg MV Mean grad:  1.0 mmHg     SHUNTS MV Vmax:       0.98 m/s     Systemic VTI:  0.14 m MV Vmean:      56.5 cm/s    Systemic Diam: 2.00 cm MV Decel Time: 125 msec MV E velocity: 100.00 cm/s MV A velocity: 71.30 cm/s MV E/A ratio:  1.40 Julien Nordmann MD Electronically signed by Julien Nordmann MD Signature Date/Time: 06/26/2022/12:33:49 PM    Final    DG C-Arm 1-60 Min-No Report  Result Date: 06/25/2022 Fluoroscopy was utilized by the requesting physician.  No radiographic interpretation.   US Abdomen Limited RUQ (LIVER/GB)  Result Date: 06/24/2022 CLINICAL DATA:  Abdominal pain EXAM: ULTRASOUND ABDOMEN LIMITED RIGHT UPPER QUADRANT COMPARISON:  None Available. FINDINGS: Gallbladder: Dilated gallbladder. Borderline wall thickening. No shadowing stones. There is some sludge. There is some adjacent fluid as well to the gallbladder. Common bile duct: Diameter: 6 mm Liver: Nodular contours of the liver with mild ascites. Right-sided pleural effusion. Portal vein is patent on color Doppler imaging with normal  direction of blood flow towards the liver. Other: None. IMPRESSION: Distended gallbladder with sludge.  No stones. There is some wall thickening and adjacent fluid although there is a nodular appearance of the liver with the ascites. Please correlate for chronic liver disease. Overall if there is further concern of acalculous cholecystitis with the dilatation and wall thickening, HIDA scan may be useful as the next step in the workup Electronically Signed   By: Karen Kays M.D.   On: 06/24/2022 12:05   CT RENAL STONE STUDY  Result Date: 06/23/2022 CLINICAL DATA:  Abdominal/flank pain, stone suspected EXAM: CT ABDOMEN AND PELVIS WITHOUT CONTRAST TECHNIQUE: Multidetector CT imaging of the abdomen and pelvis was performed following the standard protocol without IV contrast. RADIATION DOSE REDUCTION: This exam was performed according to the departmental dose-optimization program which includes automated exposure control, adjustment of the mA and/or kV according to patient size and/or use of iterative reconstruction technique. COMPARISON:  Contrast enhanced CT 04/13/2021 FINDINGS: Lower chest: Small to moderate bilateral pleural effusions. Associated bibasilar airspace disease. Additional airspace disease in the lingula and right middle lobe is chronic when compared with prior abdominal CT. There are coronary artery calcifications. Mitral annulus calcifications. The heart is enlarged. Hepatobiliary: Motion artifact through the liver, allowing for this, no evidence of focal liver lesion. The gallbladder is distended. Assessment for pericholecystic inflammation is limited due to motion through this region. The tiny gallstones on prior exam are not definitively seen. The common bile duct and biliary tree are difficult to define. There is no obvious biliary dilatation. Pancreas: Parenchymal atrophy. Motion artifact through the proximal pancreas. There is no pancreatic inflammation. Spleen: Normal in size without focal  abnormality. Adrenals/Urinary Tract: No adrenal nodule. There is a 2 cm stone in the left renal pelvis extending into the proximal ureter with moderate hydronephrosis. 14 mm no obstructing stone in the lower pole of the left kidney and 3 mm nonobstructing stone in the upper pole of the left kidney. No significant left perinephric edema. Right renal cortical atrophy. Calcification in the posterior mid upper kidney likely represents a combination of parenchymal calcification a nonobstructing stone, linear 11 mm  calcification may represent a stone fragment in the renal collecting system. Previous proximal right ureteral stone is no longer seen. There is mild right perirenal scarring. No definite ureteral calculi. Nondistended urinary bladder. No bladder stone. Stomach/Bowel: No bowel obstruction or inflammation. The appendix not confidently visualized, no appendicitis. Small to moderate volume of stool throughout the colon. Occasional colonic diverticula, no diverticulitis. Vascular/Lymphatic: Moderate aortic atherosclerosis. Aortic tortuosity but no aneurysm. Assessment for adenopathy is limited in the absence of contrast and patient motion. No obvious bulky enlarged lymph nodes. Reproductive: Hysterectomy. Small amount of air in the vagina is nonspecific. No adnexal mass. Other: Small volume of free fluid in the pelvis. Trace free fluid in the right upper quadrant. There is mild body wall subcutaneous edema. No free intra-abdominal air. Musculoskeletal: Interbody spacer at L4-L5. Mild compression deformities versus Schmorl's nodes within superior endplate of T11, L1 and L3, chronic. The bones are subjectively under mineralized. Left proximal femur surgical hardware is partially included. No acute osseous findings. IMPRESSION: 1. Obstructing 2 cm stone in the left renal pelvis extending into the proximal ureter with moderate hydronephrosis. Additional nonobstructing stones in the left kidney. 2. Mild right renal  scarring. Calcifications in the right kidney may be combination of parenchymal calcifications and stone fragments. 3. Hydropic gallbladder. Assessment for pericholecystic inflammation is limited due to motion through this region. The tiny gallstones on prior exam are not definitively seen. If there is clinical concern for acute gallbladder pathology, recommend right upper quadrant ultrasound. 4. Small to moderate bilateral pleural effusions with adjacent airspace disease, likely atelectasis. Additional airspace disease in the lingula and right middle lobe is chronic when compared with prior abdominal CT. 5. Small volume of free fluid in the pelvis and right upper quadrant, nonspecific. Mild body wall edema. Aortic Atherosclerosis (ICD10-I70.0). Electronically Signed   By: Narda Rutherford M.D.   On: 06/23/2022 18:18   DG Chest Port 1 View  Result Date: 06/21/2022 CLINICAL DATA:  Sepsis EXAM: PORTABLE CHEST 1 VIEW COMPARISON:  06/10/2018 FINDINGS: 2 frontal views of the chest demonstrate an enlarged cardiac silhouette. There is patchy bibasilar consolidation, which could reflect atelectasis or airspace disease. Lung volumes are diminished. No effusion or pneumothorax. No acute bony abnormality. IMPRESSION: 1. Low lung volumes, with patchy bibasilar consolidation consistent with atelectasis or airspace disease. 2. Enlarged cardiac silhouette. Electronically Signed   By: Sharlet Salina M.D.   On: 06/21/2022 16:58     Subjective: Patient was seen and examined at bedside.  Overnight events noted.  Patient report doing much better. Her heart rate remains controlled.  She is being discharged to SNF.  Discharge Exam: Vitals:   06/30/22 0417 06/30/22 0942  BP: 113/61 123/62  Pulse: 64 60  Resp: 20 16  Temp: 98.7 F (37.1 C) 98.1 F (36.7 C)  SpO2: 93% 98%   Vitals:   06/29/22 1940 06/29/22 2334 06/30/22 0417 06/30/22 0942  BP: 127/64 121/64 113/61 123/62  Pulse: 64 64 64 60  Resp: 19 19 20 16   Temp:  98.7 F (37.1 C) 98.6 F (37 C) 98.7 F (37.1 C) 98.1 F (36.7 C)  TempSrc:      SpO2: 99% 94% 93% 98%  Weight:      Height:        General: Pt is alert, awake, not in acute distress Cardiovascular: RRR, S1/S2 +, no rubs, no gallops Respiratory: CTA bilaterally, no wheezing, no rhonchi Abdominal: Soft, NT, ND, bowel sounds + Extremities: no edema, no cyanosis  The results of significant diagnostics from this hospitalization (including imaging, microbiology, ancillary and laboratory) are listed below for reference.     Microbiology: Recent Results (from the past 240 hour(s))  Blood Culture (routine x 2)     Status: Abnormal   Collection Time: 06/21/22  4:00 PM   Specimen: BLOOD RIGHT FOREARM  Result Value Ref Range Status   Specimen Description   Final    BLOOD RIGHT FOREARM Performed at [facility], [address] [place], [place] [address]    Special Requests   Final    BOTTLES DRAWN AEROBIC AND ANAEROBIC Blood Culture adequate volume Performed at [facility], [address] [place], [place] [address]    Culture  Setup Time   Final    IN BOTH AEROBIC AND ANAEROBIC BOTTLES GRAM NEGATIVE RODS CRITICAL RESULT CALLED TO, READ BACK BY AND VERIFIED WITHDawayne Cirri @ 8295 06/22/22 LFD Performed at [facility], [address], [place], [place] [address]    Culture ESCHERICHIA COLI (A)  Final   Report Status 06/24/2022 FINAL  Final   Organism ID, Bacteria ESCHERICHIA COLI  Final      Susceptibility   Escherichia coli - MIC*    AMPICILLIN 8 SENSITIVE Sensitive     CEFEPIME <=0.12 SENSITIVE Sensitive     CEFTAZIDIME <=1 SENSITIVE Sensitive     CEFTRIAXONE <=0.25 SENSITIVE Sensitive     CIPROFLOXACIN <=0.25 SENSITIVE Sensitive     GENTAMICIN <=1 SENSITIVE Sensitive     IMIPENEM <=0.25 SENSITIVE Sensitive     TRIMETH/SULFA <=20 SENSITIVE Sensitive     AMPICILLIN/SULBACTAM 4 SENSITIVE Sensitive     PIP/TAZO <=4 SENSITIVE Sensitive     *  ESCHERICHIA COLI  Blood Culture ID Panel (Reflexed)     Status: Abnormal   Collection Time: 06/21/22  4:00 PM  Result Value Ref Range Status   Enterococcus faecalis NOT DETECTED NOT DETECTED Final   Enterococcus Faecium NOT DETECTED NOT DETECTED Final   Listeria monocytogenes NOT DETECTED NOT DETECTED Final   Staphylococcus species NOT DETECTED NOT DETECTED Final   Staphylococcus aureus (BCID) NOT DETECTED NOT DETECTED Final   Staphylococcus epidermidis NOT DETECTED NOT DETECTED Final   Staphylococcus lugdunensis NOT DETECTED NOT DETECTED Final   Streptococcus species NOT DETECTED NOT DETECTED Final   Streptococcus agalactiae NOT DETECTED NOT DETECTED Final   Streptococcus pneumoniae NOT DETECTED NOT DETECTED Final   Streptococcus pyogenes NOT DETECTED NOT DETECTED Final   A.calcoaceticus-baumannii NOT DETECTED NOT DETECTED Final   Bacteroides fragilis NOT DETECTED NOT DETECTED Final   Enterobacterales DETECTED (A) NOT DETECTED Final    Comment: Enterobacterales represent a large order of gram negative bacteria, not a single organism. CRITICAL RESULT CALLED TO, READ BACK BY AND VERIFIED WITH: NATHAN BELUE @ 0519 06/22/22 LFD    Enterobacter cloacae complex NOT DETECTED NOT DETECTED Final   Escherichia coli DETECTED (A) NOT DETECTED Final    Comment: CRITICAL RESULT CALLED TO, READ BACK BY AND VERIFIED WITH: NATHAN BELUE @ 0519 06/22/22 LFD    Klebsiella aerogenes NOT DETECTED NOT DETECTED Final   Klebsiella oxytoca NOT DETECTED NOT DETECTED Final   Klebsiella pneumoniae NOT DETECTED NOT DETECTED Final   Proteus species NOT DETECTED NOT DETECTED Final   Salmonella species NOT DETECTED NOT DETECTED Final   Serratia marcescens NOT DETECTED NOT DETECTED Final   Haemophilus influenzae NOT DETECTED NOT DETECTED Final   Neisseria meningitidis NOT DETECTED NOT DETECTED Final   Pseudomonas aeruginosa NOT DETECTED  NOT DETECTED Final   Stenotrophomonas maltophilia NOT DETECTED NOT DETECTED  Final   Candida albicans NOT DETECTED NOT DETECTED Final   Candida auris NOT DETECTED NOT DETECTED Final   Candida glabrata NOT DETECTED NOT DETECTED Final   Candida krusei NOT DETECTED NOT DETECTED Final   Candida parapsilosis NOT DETECTED NOT DETECTED Final   Candida tropicalis NOT DETECTED NOT DETECTED Final   Cryptococcus neoformans/gattii NOT DETECTED NOT DETECTED Final   CTX-M ESBL NOT DETECTED NOT DETECTED Final   Carbapenem resistance IMP NOT DETECTED NOT DETECTED Final   Carbapenem resistance KPC NOT DETECTED NOT DETECTED Final   Carbapenem resistance NDM NOT DETECTED NOT DETECTED Final   Carbapenem resist OXA 48 LIKE NOT DETECTED NOT DETECTED Final   Carbapenem resistance VIM NOT DETECTED NOT DETECTED Final    Comment: Performed at [facility], [address], [place], [place] [address]  Blood Culture (routine x 2)     Status: Abnormal   Collection Time: 06/21/22  4:01 PM   Specimen: BLOOD  Result Value Ref Range Status   Specimen Description   Final    BLOOD RIGHT ANTECUBITAL Performed at [facility], [address] [place], [place] [address]    Special Requests   Final    BOTTLES DRAWN AEROBIC AND ANAEROBIC Blood Culture results may not be optimal due to an excessive volume of blood received in culture bottles Performed at [facility], [address] [place], [place] [address]    Culture  Setup Time   Final    GRAM NEGATIVE RODS IN BOTH AEROBIC AND ANAEROBIC BOTTLES CRITICAL RESULT CALLED TO, READ BACK BY AND VERIFIED WITH: Dawayne Cirri [id number] 06/22/22 LFD Performed at [facility], [address] [place], [place] [address]    Culture (A)  Final    ESCHERICHIA COLI SUSCEPTIBILITIES PERFORMED ON PREVIOUS CULTURE WITHIN THE LAST 5 DAYS. Performed at [facility], [address], [place], [place] [address]    Report Status 06/24/2022 FINAL  Final  Resp panel by RT-PCR (RSV, Flu A&B, Covid)     Status: None    Collection Time: 06/21/22  4:01 PM  Result Value Ref Range Status   SARS Coronavirus 2 by RT PCR NEGATIVE NEGATIVE Final    Comment: (NOTE) SARS-CoV-2 target nucleic acids are NOT DETECTED.  The SARS-CoV-2 RNA is generally detectable in upper respiratory specimens during the acute phase of infection. The lowest concentration of SARS-CoV-2 viral copies this assay can detect is 138 copies/mL. A negative result does not preclude SARS-Cov-2 infection and should not be used as the sole basis for treatment or other patient management decisions. A negative result may occur with  improper specimen collection/handling, submission of specimen other than nasopharyngeal swab, presence of viral mutation(s) within the areas targeted by this assay, and inadequate number of viral copies(<138 copies/mL). A negative result must be combined with clinical observations, patient history, and epidemiological information. The expected result is Negative.  Fact Sheet for Patients:  BloggerCourse.com  Fact Sheet for Healthcare Providers:  SeriousBroker.it  This test is no t yet approved or cleared by the [place] FDA and  has been authorized for detection and/or diagnosis of SARS-CoV-2 by FDA under an Emergency Use Authorization (EUA). This EUA will remain  in effect (meaning this test can be used) for the duration of the COVID-19 declaration under Section 564(b)(1) of the Act, 21 U.S.C.section 360bbb-3(b)(1), unless the authorization is terminated  or revoked sooner.  Influenza A by PCR NEGATIVE NEGATIVE Final   Influenza B by PCR NEGATIVE NEGATIVE Final    Comment: (NOTE) The Xpert Xpress SARS-CoV-2/FLU/RSV plus assay is intended as an aid in the diagnosis of influenza from Nasopharyngeal swab specimens and should not be used as a sole basis for treatment. Nasal washings and aspirates are unacceptable for Xpert Xpress  SARS-CoV-2/FLU/RSV testing.  Fact Sheet for Patients: BloggerCourse.com  Fact Sheet for Healthcare Providers: SeriousBroker.it  This test is not yet approved or cleared by the [place] FDA and has been authorized for detection and/or diagnosis of SARS-CoV-2 by FDA under an Emergency Use Authorization (EUA). This EUA will remain in effect (meaning this test can be used) for the duration of the COVID-19 declaration under Section 564(b)(1) of the Act, 21 U.S.C. section 360bbb-3(b)(1), unless the authorization is terminated or revoked.     Resp Syncytial Virus by PCR NEGATIVE NEGATIVE Final    Comment: (NOTE) Fact Sheet for Patients: BloggerCourse.com  Fact Sheet for Healthcare Providers: SeriousBroker.it  This test is not yet approved or cleared by the [place] FDA and has been authorized for detection and/or diagnosis of SARS-CoV-2 by FDA under an Emergency Use Authorization (EUA). This EUA will remain in effect (meaning this test can be used) for the duration of the COVID-19 declaration under Section 564(b)(1) of the Act, 21 U.S.C. section 360bbb-3(b)(1), unless the authorization is terminated or revoked.  Performed at [facility], [address], [place], [place] [address]   Urine Culture     Status: Abnormal   Collection Time: 06/21/22  7:47 PM   Specimen: Urine, Random  Result Value Ref Range Status   Specimen Description   Final    URINE, RANDOM Performed at [facility], [address] [place], [place] [address]    Special Requests   Final    NONE Reflexed from [id number] Performed at [facility], [address] [place], [place] [address]    Culture MULTIPLE SPECIES PRESENT, SUGGEST RECOLLECTION (A)  Final   Report Status 06/23/2022 FINAL  Final  MRSA Next Gen by PCR, Nasal     Status: Abnormal   Collection Time: 06/21/22  11:02 PM   Specimen: Nasal Mucosa; Nasal Swab  Result Value Ref Range Status   MRSA by PCR Next Gen DETECTED (A) NOT DETECTED Final    Comment: CRITICAL RESULT CALLED TO, READ BACK BY AND VERIFIED WITH: SCRIVNER,IVAN RN @ 0210 06/22/22 LFD Performed at [facility], [address] [place], [place] [address]   Urine Culture     Status: Abnormal   Collection Time: 06/25/22  7:54 AM   Specimen: Urine, Cystoscope  Result Value Ref Range Status   Specimen Description   Final    KIDNEY Performed at [facility], [address], [place], [place] [address]    Special Requests   Final    URINE FROM LEFT RENAL PELVIS Performed at [facility], [address] [place], [place] [address]    Culture 10,000 COLONIES/mL ESCHERICHIA COLI (A)  Final   Report Status 06/27/2022 FINAL  Final   Organism ID, Bacteria ESCHERICHIA COLI (A)  Final      Susceptibility   Escherichia coli - MIC*    AMPICILLIN 8 SENSITIVE Sensitive     CEFEPIME <=0.12 SENSITIVE Sensitive     CEFTAZIDIME <=1 SENSITIVE Sensitive     CEFTRIAXONE <=0.25 SENSITIVE Sensitive     CIPROFLOXACIN <=0.25 SENSITIVE Sensitive     GENTAMICIN <=1  SENSITIVE Sensitive     IMIPENEM <=0.25 SENSITIVE Sensitive     TRIMETH/SULFA <=20 SENSITIVE Sensitive     AMPICILLIN/SULBACTAM 4 SENSITIVE Sensitive     PIP/TAZO <=4 SENSITIVE Sensitive     * 10,000 COLONIES/mL ESCHERICHIA COLI     Labs: BNP (last 3 results) No results for input(s): "BNP" in the last 8760 hours. Basic Metabolic Panel: Recent Labs  Lab 06/24/22 0421 06/25/22 0622 06/26/22 0540 06/27/22 0514 06/28/22 0433 06/29/22 0428  NA 139 141 137 142 138 137  K 3.5 3.5 3.2* 3.3* 3.1* 3.8  CL 107 109 105 103 98 98  CO2 21* 23 22 29 31 26   GLUCOSE 92 94 102* 110* 110* 98  BUN 68* 63* 46* 35* 26* 24*  CREATININE 1.93* 1.62* 1.22* 1.21* 1.07* 1.02*  CALCIUM 8.2* 8.2* 7.9* 8.2* 8.1* 7.7*  MG 2.1 1.9 1.6* 1.3* 2.1  --   PHOS  --  3.8 3.7 3.5 2.9  --     Liver Function Tests: No results for input(s): "AST", "ALT", "ALKPHOS", "BILITOT", "PROT", "ALBUMIN" in the last 168 hours. No results for input(s): "LIPASE", "AMYLASE" in the last 168 hours. No results for input(s): "AMMONIA" in the last 168 hours. CBC: Recent Labs  Lab 06/24/22 0421 06/25/22 0622 06/26/22 0540 06/27/22 0514 06/29/22 0605  WBC 11.6* 8.7 10.9* 9.9 6.6  HGB 8.9* 9.5* 9.2* 10.4* 9.6*  HCT 25.9* 28.7* 27.2* 31.3* 28.3*  MCV 92.2 93.8 94.1 93.2 91.9  PLT 40* 72* 114* 153 215   Cardiac Enzymes: No results for input(s): "CKTOTAL", "CKMB", "CKMBINDEX", "TROPONINI" in the last 168 hours. BNP: Invalid input(s): "POCBNP" CBG: Recent Labs  Lab 06/29/22 1735  GLUCAP 91   D-Dimer No results for input(s): "DDIMER" in the last 72 hours. Hgb A1c No results for input(s): "HGBA1C" in the last 72 hours. Lipid Profile No results for input(s): "CHOL", "HDL", "LDLCALC", "TRIG", "CHOLHDL", "LDLDIRECT" in the last 72 hours. Thyroid function studies No results for input(s): "TSH", "T4TOTAL", "T3FREE", "THYROIDAB" in the last 72 hours.  Invalid input(s): "FREET3" Anemia work up No results for input(s): "VITAMINB12", "FOLATE", "FERRITIN", "TIBC", "IRON", "RETICCTPCT" in the last 72 hours. Urinalysis    Component Value Date/Time   COLORURINE AMBER (A) 06/21/2022 1947   APPEARANCEUR CLOUDY (A) 06/21/2022 1947   APPEARANCEUR Cloudy (A) 10/23/2020 1602   LABSPEC 1.014 06/21/2022 1947   LABSPEC 1.010 04/17/2011 2348   PHURINE 7.0 06/21/2022 1947   GLUCOSEU NEGATIVE 06/21/2022 1947   GLUCOSEU Negative 04/17/2011 2348   HGBUR MODERATE (A) 06/21/2022 1947   BILIRUBINUR NEGATIVE 06/21/2022 1947   BILIRUBINUR Negative 10/23/2020 1602   BILIRUBINUR Negative 04/17/2011 2348   KETONESUR NEGATIVE 06/21/2022 1947   PROTEINUR 100 (A) 06/21/2022 1947   UROBILINOGEN 0.2 06/26/2015 1204   NITRITE NEGATIVE 06/21/2022 1947   LEUKOCYTESUR LARGE (A) 06/21/2022 1947   LEUKOCYTESUR  Negative 04/17/2011 2348   Sepsis Labs Recent Labs  Lab 06/25/22 0622 06/26/22 0540 06/27/22 0514 06/29/22 0605  WBC 8.7 10.9* 9.9 6.6   Microbiology Recent Results (from the past 240 hour(s))  Blood Culture (routine x 2)     Status: Abnormal   Collection Time: 06/21/22  4:00 PM   Specimen: BLOOD RIGHT FOREARM  Result Value Ref Range Status   Specimen Description   Final    BLOOD RIGHT FOREARM Performed at [facility], [address], [place], [place] [address]    Special Requests   Final    BOTTLES DRAWN AEROBIC AND ANAEROBIC Blood Culture  adequate volume Performed at [facility], [address] [place], [place] [address]    Culture  Setup Time   Final    IN BOTH AEROBIC AND ANAEROBIC BOTTLES GRAM NEGATIVE RODS CRITICAL RESULT CALLED TO, READ BACK BY AND VERIFIED WITHDawayne Cirri @ 6045 06/22/22 LFD Performed at [facility], [address], [place], [place] [address]    Culture ESCHERICHIA COLI (A)  Final   Report Status 06/24/2022 FINAL  Final   Organism ID, Bacteria ESCHERICHIA COLI  Final      Susceptibility   Escherichia coli - MIC*    AMPICILLIN 8 SENSITIVE Sensitive     CEFEPIME <=0.12 SENSITIVE Sensitive     CEFTAZIDIME <=1 SENSITIVE Sensitive     CEFTRIAXONE <=0.25 SENSITIVE Sensitive     CIPROFLOXACIN <=0.25 SENSITIVE Sensitive     GENTAMICIN <=1 SENSITIVE Sensitive     IMIPENEM <=0.25 SENSITIVE Sensitive     TRIMETH/SULFA <=20 SENSITIVE Sensitive     AMPICILLIN/SULBACTAM 4 SENSITIVE Sensitive     PIP/TAZO <=4 SENSITIVE Sensitive     * ESCHERICHIA COLI  Blood Culture ID Panel (Reflexed)     Status: Abnormal   Collection Time: 06/21/22  4:00 PM  Result Value Ref Range Status   Enterococcus faecalis NOT DETECTED NOT DETECTED Final   Enterococcus Faecium NOT DETECTED NOT DETECTED Final   Listeria monocytogenes NOT DETECTED NOT DETECTED Final   Staphylococcus species NOT DETECTED NOT DETECTED Final   Staphylococcus aureus (BCID)  NOT DETECTED NOT DETECTED Final   Staphylococcus epidermidis NOT DETECTED NOT DETECTED Final   Staphylococcus lugdunensis NOT DETECTED NOT DETECTED Final   Streptococcus species NOT DETECTED NOT DETECTED Final   Streptococcus agalactiae NOT DETECTED NOT DETECTED Final   Streptococcus pneumoniae NOT DETECTED NOT DETECTED Final   Streptococcus pyogenes NOT DETECTED NOT DETECTED Final   A.calcoaceticus-baumannii NOT DETECTED NOT DETECTED Final   Bacteroides fragilis NOT DETECTED NOT DETECTED Final   Enterobacterales DETECTED (A) NOT DETECTED Final    Comment: Enterobacterales represent a large order of gram negative bacteria, not a single organism. CRITICAL RESULT CALLED TO, READ BACK BY AND VERIFIED WITH: NATHAN BELUE @ 0519 06/22/22 LFD    Enterobacter cloacae complex NOT DETECTED NOT DETECTED Final   Escherichia coli DETECTED (A) NOT DETECTED Final    Comment: CRITICAL RESULT CALLED TO, READ BACK BY AND VERIFIED WITH: NATHAN BELUE @ 0519 06/22/22 LFD    Klebsiella aerogenes NOT DETECTED NOT DETECTED Final   Klebsiella oxytoca NOT DETECTED NOT DETECTED Final   Klebsiella pneumoniae NOT DETECTED NOT DETECTED Final   Proteus species NOT DETECTED NOT DETECTED Final   Salmonella species NOT DETECTED NOT DETECTED Final   Serratia marcescens NOT DETECTED NOT DETECTED Final   Haemophilus influenzae NOT DETECTED NOT DETECTED Final   Neisseria meningitidis NOT DETECTED NOT DETECTED Final   Pseudomonas aeruginosa NOT DETECTED NOT DETECTED Final   Stenotrophomonas maltophilia NOT DETECTED NOT DETECTED Final   Candida albicans NOT DETECTED NOT DETECTED Final   Candida auris NOT DETECTED NOT DETECTED Final   Candida glabrata NOT DETECTED NOT DETECTED Final   Candida krusei NOT DETECTED NOT DETECTED Final   Candida parapsilosis NOT DETECTED NOT DETECTED Final   Candida tropicalis NOT DETECTED NOT DETECTED Final   Cryptococcus neoformans/gattii NOT DETECTED NOT DETECTED Final   CTX-M ESBL NOT  DETECTED NOT DETECTED Final   Carbapenem resistance IMP NOT DETECTED NOT DETECTED Final   Carbapenem resistance KPC NOT DETECTED NOT DETECTED Final  Carbapenem resistance NDM NOT DETECTED NOT DETECTED Final   Carbapenem resist OXA 48 LIKE NOT DETECTED NOT DETECTED Final   Carbapenem resistance VIM NOT DETECTED NOT DETECTED Final    Comment: Performed at [facility], [address] [place], [place] [address]  Blood Culture (routine x 2)     Status: Abnormal   Collection Time: 06/21/22  4:01 PM   Specimen: BLOOD  Result Value Ref Range Status   Specimen Description   Final    BLOOD RIGHT ANTECUBITAL Performed at [facility], [address] [place], [place] [address]    Special Requests   Final    BOTTLES DRAWN AEROBIC AND ANAEROBIC Blood Culture results may not be optimal due to an excessive volume of blood received in culture bottles Performed at [facility], [address], [place], [place] [address]    Culture  Setup Time   Final    GRAM NEGATIVE RODS IN BOTH AEROBIC AND ANAEROBIC BOTTLES CRITICAL RESULT CALLED TO, READ BACK BY AND VERIFIED WITH: Dawayne Cirri [id number] 06/22/22 LFD Performed at [facility], [address] [place], [place] [address]    Culture (A)  Final    ESCHERICHIA COLI SUSCEPTIBILITIES PERFORMED ON PREVIOUS CULTURE WITHIN THE LAST 5 DAYS. Performed at [facility], [address], [place], [place] [address]    Report Status 06/24/2022 FINAL  Final  Resp panel by RT-PCR (RSV, Flu A&B, Covid)     Status: None   Collection Time: 06/21/22  4:01 PM  Result Value Ref Range Status   SARS Coronavirus 2 by RT PCR NEGATIVE NEGATIVE Final    Comment: (NOTE) SARS-CoV-2 target nucleic acids are NOT DETECTED.  The SARS-CoV-2 RNA is generally detectable in upper respiratory specimens during the acute phase of infection. The lowest concentration of SARS-CoV-2 viral copies this assay can detect is 138 copies/mL. A  negative result does not preclude SARS-Cov-2 infection and should not be used as the sole basis for treatment or other patient management decisions. A negative result may occur with  improper specimen collection/handling, submission of specimen other than nasopharyngeal swab, presence of viral mutation(s) within the areas targeted by this assay, and inadequate number of viral copies(<138 copies/mL). A negative result must be combined with clinical observations, patient history, and epidemiological information. The expected result is Negative.  Fact Sheet for Patients:  BloggerCourse.com  Fact Sheet for Healthcare Providers:  SeriousBroker.it  This test is no t yet approved or cleared by the [place] FDA and  has been authorized for detection and/or diagnosis of SARS-CoV-2 by FDA under an Emergency Use Authorization (EUA). This EUA will remain  in effect (meaning this test can be used) for the duration of the COVID-19 declaration under Section 564(b)(1) of the Act, 21 U.S.C.section 360bbb-3(b)(1), unless the authorization is terminated  or revoked sooner.       Influenza A by PCR NEGATIVE NEGATIVE Final   Influenza B by PCR NEGATIVE NEGATIVE Final    Comment: (NOTE) The Xpert Xpress SARS-CoV-2/FLU/RSV plus assay is intended as an aid in the diagnosis of influenza from Nasopharyngeal swab specimens and should not be used as a sole basis for treatment. Nasal washings and aspirates are unacceptable for Xpert Xpress SARS-CoV-2/FLU/RSV testing.  Fact Sheet for Patients: BloggerCourse.com  Fact Sheet for Healthcare Providers: SeriousBroker.it  This test is not yet approved or cleared by the [place] FDA and has been authorized for detection and/or diagnosis of SARS-CoV-2 by FDA under an Emergency  Use Authorization (EUA). This EUA will remain in effect (meaning this test can  be used) for the duration of the COVID-19 declaration under Section 564(b)(1) of the Act, 21 U.S.C. section 360bbb-3(b)(1), unless the authorization is terminated or revoked.     Resp Syncytial Virus by PCR NEGATIVE NEGATIVE Final    Comment: (NOTE) Fact Sheet for Patients: BloggerCourse.com  Fact Sheet for Healthcare Providers: SeriousBroker.it  This test is not yet approved or cleared by the [place] FDA and has been authorized for detection and/or diagnosis of SARS-CoV-2 by FDA under an Emergency Use Authorization (EUA). This EUA will remain in effect (meaning this test can be used) for the duration of the COVID-19 declaration under Section 564(b)(1) of the Act, 21 U.S.C. section 360bbb-3(b)(1), unless the authorization is terminated or revoked.  Performed at [facility], [address] [place], [place] [address]   Urine Culture     Status: Abnormal   Collection Time: 06/21/22  7:47 PM   Specimen: Urine, Random  Result Value Ref Range Status   Specimen Description   Final    URINE, RANDOM Performed at [facility], [address] [place], [place] [address]    Special Requests   Final    NONE Reflexed from [id number] Performed at [facility], [address] [place], [place] [address]    Culture MULTIPLE SPECIES PRESENT, SUGGEST RECOLLECTION (A)  Final   Report Status 06/23/2022 FINAL  Final  MRSA Next Gen by PCR, Nasal     Status: Abnormal   Collection Time: 06/21/22 11:02 PM   Specimen: Nasal Mucosa; Nasal Swab  Result Value Ref Range Status   MRSA by PCR Next Gen DETECTED (A) NOT DETECTED Final    Comment: CRITICAL RESULT CALLED TO, READ BACK BY AND VERIFIED WITH: SCRIVNER,IVAN RN @ 0210 06/22/22 LFD Performed at [facility], [address] [place], [place] [address]   Urine Culture     Status: Abnormal   Collection Time: 06/25/22  7:54 AM   Specimen: Urine,  Cystoscope  Result Value Ref Range Status   Specimen Description   Final    KIDNEY Performed at [facility], [address], [place], [place] [address]    Special Requests   Final    URINE FROM LEFT RENAL PELVIS Performed at [facility], [address] [place], [place] [address]    Culture 10,000 COLONIES/mL ESCHERICHIA COLI (A)  Final   Report Status 06/27/2022 FINAL  Final   Organism ID, Bacteria ESCHERICHIA COLI (A)  Final      Susceptibility   Escherichia coli - MIC*    AMPICILLIN 8 SENSITIVE Sensitive     CEFEPIME <=0.12 SENSITIVE Sensitive     CEFTAZIDIME <=1 SENSITIVE Sensitive     CEFTRIAXONE <=0.25 SENSITIVE Sensitive     CIPROFLOXACIN <=0.25 SENSITIVE Sensitive     GENTAMICIN <=1 SENSITIVE Sensitive     IMIPENEM <=0.25 SENSITIVE Sensitive     TRIMETH/SULFA <=20 SENSITIVE Sensitive     AMPICILLIN/SULBACTAM 4 SENSITIVE Sensitive     PIP/TAZO <=4 SENSITIVE Sensitive     * 10,000 COLONIES/mL ESCHERICHIA COLI     Time coordinating discharge: Over 30 minutes  SIGNED:   Willeen Niece, MD  [facility] 06/30/2022, 10:33 AM Pager   If 7PM-7AM, please contact night-coverage

## 2022-06-29 NOTE — Progress Notes (Signed)
PROGRESS NOTE    Alison Price  NWG:[id number] DOB: 1945/03/20 DOA: 06/21/2022 PCP: Jaclyn Shaggy, MD   Brief Narrative:  This 77 yrs old  female with PMH significant of dementia, hypertension, GERD, arthritis, Stevens-Johnson's syndrome, recurrent falls with recent right patella and left wrist fracture who presented to the ED with chief complaints of altered mental status.  She was found to have severe sepsis secondary to UTI.  She was started on IV antibiotics.  CT renal study showed obstructing stone causing hydronephrosis. Urology is consulted,  Patient underwent cystoscopy with left ureteral stent placement.  Post procedure patient developed A-fib with RVR and was given a dose of metoprolol and she converted to normal sinus rhythm.  Cardiology is consulted. Echo showed LVEF 25-30%. Not a good candidate for ischemic evaluation given age and multiple co morbid conditions.  Assessment & Plan:   Principal Problem:   Severe sepsis with lactic acidosis (HCC) Active Problems:   Hydronephrosis with urinary obstruction due to renal calculus   Nephrolithiasis   AKI (acute kidney injury) (HCC)   Lower urinary tract infectious disease   Altered mental status   Lactic acidosis   PAF (paroxysmal atrial fibrillation) (HCC)   Dilated cardiomyopathy (HCC)  Severe sepsis /shock due to E. coli UTI: E. coli bacteremia / pneumonia: H/o Ureteral stone in 2022 (had cystoscopy with stent placement and h/o Lithotripsy): Patient presented with septic shock requiring IV fluids / vasopressors. Blood culture positive for E. Coli. Initiated on IV Rocephin , changed to Duricef 1g bid for 3 days on  5/10. She remains afebrile. Lactic acid normalized with IV hydration. Procalcitonin trended down 150 > 33.99 > 6.43. Patient currently on room air. No cough, Sepsis physiology improving.  Leukocytosis: Presented with WBC 4.8 trended up to 19.8--22K. Likely secondary to UTI, finished  antibiotics. Leucocytosis resolved.   Paroxysmal  New onset A. fib in the setting of sepsis >  Now resolved Heart rate now ranges between 57-70 Post procedure patient went into A-fib again with RVR , given a dose of metoprolol  She converted to normal sinus rhythm. She is not a candidate for anticoagulation given recurrent falls. Continue amiodarone 400 twice daily and metoprolol 25 mg bid. As per family she is not high fall risk.  She is started on Eliquis 5 mg twice daily. Continue amiodarone,  metoprolol and Eliquis.  Dilated cardiomyopathy: Echo showed LVEF 25 to 30%. Not a good candidate for ischemic workup given age and multiple comorbid conditions. Cardiology recommended continue IV Lasix twice daily. Consider ARB if renal functions improves. At discharge she will need Lasix 40 mg daily with potassium.  AKI on chronic kidney disease stage IV: Baseline serum creatinine( 0.9--1.22 in sept 2022).  Creatinine back to baseline.  Left hydronephrosis : CT renal study shows obstructing stone causing hydronephrosis on left side Urology is consulted.  Patient underwent cystoscopy with left ureteral stent placement. Continue antibiotics and urology follow-up. Urology recommended 7 to 10 days of culture specific antibiotics and outpatient follow-up.   Hypomagnesimia Replaced. Resolved.   GERD: Continue Pantoprazole 40 mg daily.   Generalized weakness with deconditioning: PT and OT evaluation.   Dementia without behavioral problems: Sister [name] who is next of kin.  She reports patient has dementia. Patient lives with [name] at home. She is fairly independent. TOC for discharge planning   Hypokalemia: Replaced. Continue to monitor  DVT prophylaxis: Lovenox Code Status: DNR Family Communication: No family at bedside Disposition Plan:  Status is: Inpatient Remains  inpatient appropriate because: Admitted for severe sepsis secondary to E. coli bacteremia, UTI.  CT renal  study shows kidney stone causing hydronephrosis.  Urology is consulted.  Continued on IV antibiotics.  Underwent cystoscopy with left ureteral stent placement.  A-fib with RVR.  Heart rate is now controlled started on Eliquis amiodarone.  Anticipated discharge to SNF tomorrow.  Consultants:  Urology  Procedures: CT renal study.  Antimicrobials:  Anti-infectives (From admission, onward)    Start     Dose/Rate Route Frequency Ordered Stop   06/29/22 0000  cefadroxil (DURICEF) 500 MG capsule        1,000 mg Oral 2 times daily 06/29/22 1047 07/01/22 2359   06/28/22 1000  cefadroxil (DURICEF) capsule 1,000 mg        1,000 mg Oral 2 times daily 06/27/22 0847 07/01/22 0959   06/22/22 1000  azithromycin (ZITHROMAX) 500 mg in sodium chloride 0.9 % 250 mL IVPB  Status:  Discontinued        500 mg 250 mL/hr over 60 Minutes Intravenous Every 24 hours 06/21/22 2251 06/22/22 0913   06/22/22 0800  cefTRIAXone (ROCEPHIN) 2 g in sodium chloride 0.9 % 100 mL IVPB        2 g 200 mL/hr over 30 Minutes Intravenous Every 24 hours 06/21/22 2251 06/27/22 1013   06/21/22 1700  ceFEPIme (MAXIPIME) 2 g in sodium chloride 0.9 % 100 mL IVPB        2 g 200 mL/hr over 30 Minutes Intravenous  Once 06/21/22 1652 06/21/22 1713   06/21/22 1700  vancomycin (VANCOCIN) IVPB 1000 mg/200 mL premix        1,000 mg 200 mL/hr over 60 Minutes Intravenous  Once 06/21/22 1652 06/21/22 1814       Subjective: Patient was seen and examined at bedside.  Overnight events noted.   Patient has baseline dementia.  She appears very deconditioned and weak. She is oriented to self only.  She follows commands. Her heart rate remains in normal sinus rhythm.  Objective: Vitals:   06/29/22 0500 06/29/22 0504 06/29/22 0844 06/29/22 1133  BP:  125/62 (!) 126/59 (!) 104/53  Pulse:  64 65 63  Resp:  18 14 16   Temp:  98 F (36.7 C) 98 F (36.7 C) 97.8 F (36.6 C)  TempSrc:  Oral  Oral  SpO2:  100% 99% 100%  Weight: 99.7 kg      Height:        Intake/Output Summary (Last 24 hours) at 06/29/2022 1248 Last data filed at 06/29/2022 0845 Gross per 24 hour  Intake 120 ml  Output 1350 ml  Net -1230 ml   Filed Weights   06/27/22 0700 06/28/22 0500 06/29/22 0500  Weight: 104.2 kg 99.9 kg 99.7 kg    Examination:  General exam: Appears deconditioned, weak, comfortable, not in any acute distress. Respiratory system: CTA bilaterally, respiratory effort normal.  RR 14. Cardiovascular system: S1 & S2 heard, regular rate and rhythm, no murmur. Gastrointestinal system: Abdomen is soft, non tender, non distended, BS+ Central nervous system: Alert and oriented x 1. No focal neurological deficits. Extremities: No edema, no cyanosis, no clubbing. Skin: No rashes, lesions or ulcers Psychiatry: Mood & affect appropriate.     Data Reviewed: I have personally reviewed following labs and imaging studies  CBC: Recent Labs  Lab 06/24/22 0421 06/25/22 0622 06/26/22 0540 06/27/22 0514 06/29/22 0605  WBC 11.6* 8.7 10.9* 9.9 6.6  HGB 8.9* 9.5* 9.2* 10.4* 9.6*  HCT 25.9* 28.7* 27.2*  31.3* 28.3*  MCV 92.2 93.8 94.1 93.2 91.9  PLT 40* 72* 114* 153 215   Basic Metabolic Panel: Recent Labs  Lab 06/24/22 0421 06/25/22 0622 06/26/22 0540 06/27/22 0514 06/28/22 0433 06/29/22 0428  NA 139 141 137 142 138 137  K 3.5 3.5 3.2* 3.3* 3.1* 3.8  CL 107 109 105 103 98 98  CO2 21* 23 22 29 31 26   GLUCOSE 92 94 102* 110* 110* 98  BUN 68* 63* 46* 35* 26* 24*  CREATININE 1.93* 1.62* 1.22* 1.21* 1.07* 1.02*  CALCIUM 8.2* 8.2* 7.9* 8.2* 8.1* 7.7*  MG 2.1 1.9 1.6* 1.3* 2.1  --   PHOS  --  3.8 3.7 3.5 2.9  --    GFR: Estimated Creatinine Clearance: 60 mL/min (A) (by C-G formula based on SCr of 1.02 mg/dL (H)). Liver Function Tests: No results for input(s): "AST", "ALT", "ALKPHOS", "BILITOT", "PROT", "ALBUMIN" in the last 168 hours.  No results for input(s): "LIPASE", "AMYLASE" in the last 168 hours. No results for input(s):  "AMMONIA" in the last 168 hours. Coagulation Profile: No results for input(s): "INR", "PROTIME" in the last 168 hours. Cardiac Enzymes: No results for input(s): "CKTOTAL", "CKMB", "CKMBINDEX", "TROPONINI" in the last 168 hours. BNP (last 3 results) No results for input(s): "PROBNP" in the last 8760 hours. HbA1C: No results for input(s): "HGBA1C" in the last 72 hours. CBG: No results for input(s): "GLUCAP" in the last 168 hours.  Lipid Profile: No results for input(s): "CHOL", "HDL", "LDLCALC", "TRIG", "CHOLHDL", "LDLDIRECT" in the last 72 hours. Thyroid Function Tests: No results for input(s): "TSH", "T4TOTAL", "FREET4", "T3FREE", "THYROIDAB" in the last 72 hours.  Anemia Panel: No results for input(s): "VITAMINB12", "FOLATE", "FERRITIN", "TIBC", "IRON", "RETICCTPCT" in the last 72 hours. Sepsis Labs: Recent Labs  Lab 06/25/22 0622 06/27/22 0819  PROCALCITON 33.99 6.43  LATICACIDVEN 1.2  --     Recent Results (from the past 240 hour(s))  Blood Culture (routine x 2)     Status: Abnormal   Collection Time: 06/21/22  4:00 PM   Specimen: BLOOD RIGHT FOREARM  Result Value Ref Range Status   Specimen Description   Final    BLOOD RIGHT FOREARM Performed at [facility], [address] [place], [place] [address]    Special Requests   Final    BOTTLES DRAWN AEROBIC AND ANAEROBIC Blood Culture adequate volume Performed at [facility], [address] [place], [place] [address]    Culture  Setup Time   Final    IN BOTH AEROBIC AND ANAEROBIC BOTTLES GRAM NEGATIVE RODS CRITICAL RESULT CALLED TO, READ BACK BY AND VERIFIED WITHDawayne Cirri @ 0981 06/22/22 LFD Performed at [facility], [address], [place], [place] [address]    Culture ESCHERICHIA COLI (A)  Final   Report Status 06/24/2022 FINAL  Final   Organism ID, Bacteria ESCHERICHIA COLI  Final      Susceptibility   Escherichia coli - MIC*    AMPICILLIN 8 SENSITIVE Sensitive     CEFEPIME <=0.12  SENSITIVE Sensitive     CEFTAZIDIME <=1 SENSITIVE Sensitive     CEFTRIAXONE <=0.25 SENSITIVE Sensitive     CIPROFLOXACIN <=0.25 SENSITIVE Sensitive     GENTAMICIN <=1 SENSITIVE Sensitive     IMIPENEM <=0.25 SENSITIVE Sensitive     TRIMETH/SULFA <=20 SENSITIVE Sensitive     AMPICILLIN/SULBACTAM 4 SENSITIVE Sensitive     PIP/TAZO <=4 SENSITIVE Sensitive     * ESCHERICHIA COLI  Blood Culture ID  Panel (Reflexed)     Status: Abnormal   Collection Time: 06/21/22  4:00 PM  Result Value Ref Range Status   Enterococcus faecalis NOT DETECTED NOT DETECTED Final   Enterococcus Faecium NOT DETECTED NOT DETECTED Final   Listeria monocytogenes NOT DETECTED NOT DETECTED Final   Staphylococcus species NOT DETECTED NOT DETECTED Final   Staphylococcus aureus (BCID) NOT DETECTED NOT DETECTED Final   Staphylococcus epidermidis NOT DETECTED NOT DETECTED Final   Staphylococcus lugdunensis NOT DETECTED NOT DETECTED Final   Streptococcus species NOT DETECTED NOT DETECTED Final   Streptococcus agalactiae NOT DETECTED NOT DETECTED Final   Streptococcus pneumoniae NOT DETECTED NOT DETECTED Final   Streptococcus pyogenes NOT DETECTED NOT DETECTED Final   A.calcoaceticus-baumannii NOT DETECTED NOT DETECTED Final   Bacteroides fragilis NOT DETECTED NOT DETECTED Final   Enterobacterales DETECTED (A) NOT DETECTED Final    Comment: Enterobacterales represent a large order of gram negative bacteria, not a single organism. CRITICAL RESULT CALLED TO, READ BACK BY AND VERIFIED WITH: NATHAN BELUE @ 0519 06/22/22 LFD    Enterobacter cloacae complex NOT DETECTED NOT DETECTED Final   Escherichia coli DETECTED (A) NOT DETECTED Final    Comment: CRITICAL RESULT CALLED TO, READ BACK BY AND VERIFIED WITH: NATHAN BELUE @ 0519 06/22/22 LFD    Klebsiella aerogenes NOT DETECTED NOT DETECTED Final   Klebsiella oxytoca NOT DETECTED NOT DETECTED Final   Klebsiella pneumoniae NOT DETECTED NOT DETECTED Final   Proteus species NOT  DETECTED NOT DETECTED Final   Salmonella species NOT DETECTED NOT DETECTED Final   Serratia marcescens NOT DETECTED NOT DETECTED Final   Haemophilus influenzae NOT DETECTED NOT DETECTED Final   Neisseria meningitidis NOT DETECTED NOT DETECTED Final   Pseudomonas aeruginosa NOT DETECTED NOT DETECTED Final   Stenotrophomonas maltophilia NOT DETECTED NOT DETECTED Final   Candida albicans NOT DETECTED NOT DETECTED Final   Candida auris NOT DETECTED NOT DETECTED Final   Candida glabrata NOT DETECTED NOT DETECTED Final   Candida krusei NOT DETECTED NOT DETECTED Final   Candida parapsilosis NOT DETECTED NOT DETECTED Final   Candida tropicalis NOT DETECTED NOT DETECTED Final   Cryptococcus neoformans/gattii NOT DETECTED NOT DETECTED Final   CTX-M ESBL NOT DETECTED NOT DETECTED Final   Carbapenem resistance IMP NOT DETECTED NOT DETECTED Final   Carbapenem resistance KPC NOT DETECTED NOT DETECTED Final   Carbapenem resistance NDM NOT DETECTED NOT DETECTED Final   Carbapenem resist OXA 48 LIKE NOT DETECTED NOT DETECTED Final   Carbapenem resistance VIM NOT DETECTED NOT DETECTED Final    Comment: Performed at [facility], [address] [place], [place] [address]  Blood Culture (routine x 2)     Status: Abnormal   Collection Time: 06/21/22  4:01 PM   Specimen: BLOOD  Result Value Ref Range Status   Specimen Description   Final    BLOOD RIGHT ANTECUBITAL Performed at [facility], [address] [place], [place] [address]    Special Requests   Final    BOTTLES DRAWN AEROBIC AND ANAEROBIC Blood Culture results may not be optimal due to an excessive volume of blood received in culture bottles Performed at [facility], [address] [place], [place] [address]    Culture  Setup Time   Final    GRAM NEGATIVE RODS IN BOTH AEROBIC AND ANAEROBIC BOTTLES CRITICAL RESULT CALLED TO, READ BACK BY AND VERIFIED WITH: Dawayne Cirri 2130 06/22/22 LFD Performed at [facility], [address]  [place], [place] [address]    Culture (A)  Final    ESCHERICHIA COLI SUSCEPTIBILITIES PERFORMED ON PREVIOUS CULTURE WITHIN THE LAST 5 DAYS. Performed at [facility], [address] [place], [place] [address]    Report Status 06/24/2022 FINAL  Final  Resp panel by RT-PCR (RSV, Flu A&B, Covid)     Status: None   Collection Time: 06/21/22  4:01 PM  Result Value Ref Range Status   SARS Coronavirus 2 by RT PCR NEGATIVE NEGATIVE Final    Comment: (NOTE) SARS-CoV-2 target nucleic acids are NOT DETECTED.  The SARS-CoV-2 RNA is generally detectable in upper respiratory specimens during the acute phase of infection. The lowest concentration of SARS-CoV-2 viral copies this assay can detect is 138 copies/mL. A negative result does not preclude SARS-Cov-2 infection and should not be used as the sole basis for treatment or other patient management decisions. A negative result may occur with  improper specimen collection/handling, submission of specimen other than nasopharyngeal swab, presence of viral mutation(s) within the areas targeted by this assay, and inadequate number of viral copies(<138 copies/mL). A negative result must be combined with clinical observations, patient history, and epidemiological information. The expected result is Negative.  Fact Sheet for Patients:  BloggerCourse.com  Fact Sheet for Healthcare Providers:  SeriousBroker.it  This test is no t yet approved or cleared by the [place] FDA and  has been authorized for detection and/or diagnosis of SARS-CoV-2 by FDA under an Emergency Use Authorization (EUA). This EUA will remain  in effect (meaning this test can be used) for the duration of the COVID-19 declaration under Section 564(b)(1) of the Act, 21 U.S.C.section 360bbb-3(b)(1), unless the authorization is terminated  or revoked sooner.       Influenza A by PCR NEGATIVE  NEGATIVE Final   Influenza B by PCR NEGATIVE NEGATIVE Final    Comment: (NOTE) The Xpert Xpress SARS-CoV-2/FLU/RSV plus assay is intended as an aid in the diagnosis of influenza from Nasopharyngeal swab specimens and should not be used as a sole basis for treatment. Nasal washings and aspirates are unacceptable for Xpert Xpress SARS-CoV-2/FLU/RSV testing.  Fact Sheet for Patients: BloggerCourse.com  Fact Sheet for Healthcare Providers: SeriousBroker.it  This test is not yet approved or cleared by the [place] FDA and has been authorized for detection and/or diagnosis of SARS-CoV-2 by FDA under an Emergency Use Authorization (EUA). This EUA will remain in effect (meaning this test can be used) for the duration of the COVID-19 declaration under Section 564(b)(1) of the Act, 21 U.S.C. section 360bbb-3(b)(1), unless the authorization is terminated or revoked.     Resp Syncytial Virus by PCR NEGATIVE NEGATIVE Final    Comment: (NOTE) Fact Sheet for Patients: BloggerCourse.com  Fact Sheet for Healthcare Providers: SeriousBroker.it  This test is not yet approved or cleared by the [place] FDA and has been authorized for detection and/or diagnosis of SARS-CoV-2 by FDA under an Emergency Use Authorization (EUA). This EUA will remain in effect (meaning this test can be used) for the duration of the COVID-19 declaration under Section 564(b)(1) of the Act, 21 U.S.C. section 360bbb-3(b)(1), unless the authorization is terminated or revoked.  Performed at [facility], [address], [place], [place] [address]   Urine Culture     Status: Abnormal   Collection Time: 06/21/22  7:47 PM   Specimen: Urine, Random  Result Value Ref Range Status   Specimen Description   Final    URINE, RANDOM Performed at [facility]  [facility], [address] [place], [place]  [address]    Special Requests   Final    NONE Reflexed from [phone] Performed at [facility], [address] [place], [place] [address]    Culture MULTIPLE SPECIES PRESENT, SUGGEST RECOLLECTION (A)  Final   Report Status 06/23/2022 FINAL  Final  MRSA Next Gen by PCR, Nasal     Status: Abnormal   Collection Time: 06/21/22 11:02 PM   Specimen: Nasal Mucosa; Nasal Swab  Result Value Ref Range Status   MRSA by PCR Next Gen DETECTED (A) NOT DETECTED Final    Comment: CRITICAL RESULT CALLED TO, READ BACK BY AND VERIFIED WITH: SCRIVNER,IVAN RN @ 0210 06/22/22 LFD Performed at [facility], [address], [place], [place] [address]   Urine Culture     Status: Abnormal   Collection Time: 06/25/22  7:54 AM   Specimen: Urine, Cystoscope  Result Value Ref Range Status   Specimen Description   Final    KIDNEY Performed at [facility], [address] [place], [place] [address]    Special Requests   Final    URINE FROM LEFT RENAL PELVIS Performed at [facility], [address] [place], [place] [address]    Culture 10,000 COLONIES/mL ESCHERICHIA COLI (A)  Final   Report Status 06/27/2022 FINAL  Final   Organism ID, Bacteria ESCHERICHIA COLI (A)  Final      Susceptibility   Escherichia coli - MIC*    AMPICILLIN 8 SENSITIVE Sensitive     CEFEPIME <=0.12 SENSITIVE Sensitive     CEFTAZIDIME <=1 SENSITIVE Sensitive     CEFTRIAXONE <=0.25 SENSITIVE Sensitive     CIPROFLOXACIN <=0.25 SENSITIVE Sensitive     GENTAMICIN <=1 SENSITIVE Sensitive     IMIPENEM <=0.25 SENSITIVE Sensitive     TRIMETH/SULFA <=20 SENSITIVE Sensitive     AMPICILLIN/SULBACTAM 4 SENSITIVE Sensitive     PIP/TAZO <=4 SENSITIVE Sensitive     * 10,000 COLONIES/mL ESCHERICHIA COLI    Radiology Studies: No results found.  Scheduled Meds:  amiodarone  200 mg Oral Daily   apixaban  5 mg Oral BID   cefadroxil  1,000 mg Oral BID   cholecalciferol  2,000 Units Oral Daily   donepezil  10  mg Oral QHS   escitalopram  5 mg Oral Daily   feeding supplement  237 mL Oral BID BM   ferrous sulfate  324 mg Oral Q breakfast   furosemide  40 mg Oral Daily   melatonin  10 mg Oral QHS   metoprolol succinate  50 mg Oral Daily   multivitamin with minerals  1 tablet Oral Daily   sacubitril-valsartan  1 tablet Oral BID   senna-docusate  1 tablet Oral Daily   Continuous Infusions:  sodium chloride 10 mL/hr at 06/26/22 1300     LOS: 8 days    Time spent: 35 mins    Willeen Niece, MD [facility]   If 7PM-7AM, please contact night-coverage

## 2022-06-29 NOTE — Progress Notes (Signed)
Rounding Note    Patient Name: Alison Price Date of Encounter: 06/29/2022  [facility] Cardiologist: Julien Nordmann, MD   Subjective   Reports feeling well, no complaints, would like to go home No family at the bedside  Inpatient Medications    Scheduled Meds:  amiodarone  200 mg Oral Daily   apixaban  5 mg Oral BID   cefadroxil  1,000 mg Oral BID   cholecalciferol  2,000 Units Oral Daily   donepezil  10 mg Oral QHS   escitalopram  5 mg Oral Daily   feeding supplement  237 mL Oral BID BM   ferrous sulfate  324 mg Oral Q breakfast   furosemide  40 mg Oral Daily   melatonin  10 mg Oral QHS   metoprolol succinate  50 mg Oral Daily   multivitamin with minerals  1 tablet Oral Daily   sacubitril-valsartan  1 tablet Oral BID   senna-docusate  1 tablet Oral Daily   Continuous Infusions:  sodium chloride 10 mL/hr at 06/26/22 1300   PRN Meds: acetaminophen, metoprolol tartrate, ondansetron, oxyCODONE, polyethylene glycol, senna-docusate   Vital Signs    Vitals:   06/29/22 0500 06/29/22 0504 06/29/22 0844 06/29/22 1133  BP:  125/62 (!) 126/59 (!) 104/53  Pulse:  64 65 63  Resp:  18 14 16   Temp:  98 F (36.7 C) 98 F (36.7 C) 97.8 F (36.6 C)  TempSrc:  Oral  Oral  SpO2:  100% 99% 100%  Weight: 99.7 kg     Height:        Intake/Output Summary (Last 24 hours) at 06/29/2022 1508 Last data filed at 06/29/2022 0845 Gross per 24 hour  Intake --  Output 500 ml  Net -500 ml      06/29/2022    5:00 AM 06/28/2022    5:00 AM 06/27/2022    7:00 AM  Last 3 Weights  Weight (lbs) 219 lb 12.8 oz 220 lb 3.8 oz 229 lb 11.5 oz  Weight (kg) 99.7 kg 99.9 kg 104.2 kg      Telemetry    Normal sinus rhythm- Personally Reviewed  ECG     - Personally Reviewed  Physical Exam   GEN: No acute distress.   Neck: No JVD Cardiac: RRR, no murmurs, rubs, or gallops.  Respiratory: Clear to auscultation bilaterally. GI: Soft, nontender, non-distended  MS: No  edema; No deformity. Neuro:  Nonfocal  Psych: Normal affect   Labs    High Sensitivity Troponin:   Recent Labs  Lab 06/21/22 2301 06/21/22 2358  TROPONINIHS 85* 91*     Chemistry Recent Labs  Lab 06/26/22 0540 06/27/22 0514 06/28/22 0433 06/29/22 0428  NA 137 142 138 137  K 3.2* 3.3* 3.1* 3.8  CL 105 103 98 98  CO2 22 29 31 26   GLUCOSE 102* 110* 110* 98  BUN 46* 35* 26* 24*  CREATININE 1.22* 1.21* 1.07* 1.02*  CALCIUM 7.9* 8.2* 8.1* 7.7*  MG 1.6* 1.3* 2.1  --   GFRNONAA 46* 46* 54* 57*  ANIONGAP 10 10 9 13     Lipids No results for input(s): "CHOL", "TRIG", "HDL", "LABVLDL", "LDLCALC", "CHOLHDL" in the last 168 hours.  Hematology Recent Labs  Lab 06/26/22 0540 06/27/22 0514 06/29/22 0605  WBC 10.9* 9.9 6.6  RBC 2.89* 3.36* 3.08*  HGB 9.2* 10.4* 9.6*  HCT 27.2* 31.3* 28.3*  MCV 94.1 93.2 91.9  MCH 31.8 31.0 31.2  MCHC 33.8 33.2 33.9  RDW 14.3 14.2  14.4  PLT 114* 153 215   Thyroid No results for input(s): "TSH", "FREET4" in the last 168 hours.  BNPNo results for input(s): "BNP", "PROBNP" in the last 168 hours.  DDimer No results for input(s): "DDIMER" in the last 168 hours.   Radiology    No results found.  Cardiac Studies     Patient Profile     Expand All Collapse All     Cardiology Progress Note    Patient Name: Alison Price Date of Encounter: 06/28/2022   Primary Cardiologist: Julien Nordmann, MD   Subjective    Denies dyspnea.  Eager to go home/out of hospital.  Maintaining sinus rhythm.   Inpatient Medications    Scheduled Meds:  [START ON 07/02/2022] amiodarone  200 mg Oral BID   amiodarone  400 mg Oral BID   apixaban  5 mg Oral BID   cefadroxil  1,000 mg Oral BID   cholecalciferol  2,000 Units Oral Daily   donepezil  10 mg Oral QHS   escitalopram  5 mg Oral Daily   feeding supplement  237 mL Oral BID BM   ferrous sulfate  324 mg Oral Q breakfast   furosemide  40 mg Intravenous BID   melatonin  10 mg Oral QHS    metoprolol succinate  50 mg Oral Daily   multivitamin with minerals  1 tablet Oral Daily   potassium chloride  40 mEq Oral Q4H   sacubitril-valsartan  1 tablet Oral BID   senna-docusate  1 tablet Oral Daily    Continuous Infusions:  sodium chloride 10 mL/hr at 06/26/22 1300    PRN Meds: acetaminophen, metoprolol tartrate, ondansetron, oxyCODONE, polyethylene glycol, senna-docusate    Vital Signs          Vitals:    06/27/22 2353 06/28/22 0500 06/28/22 0514 06/28/22 0805  BP: (!) 101/51   128/73 (!) 119/57  Pulse: (!) 57   (!) 59 (!) 44  Resp: 19   16 16   Temp: 98.7 F (37.1 C)   98.3 F (36.8 C)    TempSrc: Oral   Oral    SpO2: 98%   96% (!) 80%  Weight:   99.9 kg      Height:              Intake/Output Summary (Last 24 hours) at 06/28/2022 0825 Last data filed at 06/28/2022 0556    Gross per 24 hour  Intake 0 ml  Output 1500 ml  Net -1500 ml         Filed Weights    06/26/22 0603 06/27/22 0700 06/28/22 0500  Weight: 104.2 kg 104.2 kg 99.9 kg      Physical Exam    GEN: Well nourished, well developed, in no acute distress.  More talkative today. HEENT: Grossly normal.  Neck: Supple, no JVD, carotid bruits, or masses. Cardiac: RRR, no murmurs, rubs, or gallops. No clubbing, cyanosis, edema.  Radials 2+, DP/PT 2+ and equal bilaterally.  Respiratory:  Respirations regular and unlabored, clear to auscultation bilaterally. GI: Soft, nontender, nondistended, BS + x 4. MS: no deformity or atrophy. Skin: warm and dry, no rash. Neuro:  Strength and sensation are intact. Psych: Disoriented to time.  Normal affect.   Labs    Chemistry Last Labs         Recent Labs  Lab 06/21/22 1601 06/21/22 2301 06/26/22 0540 06/27/22 0514 06/28/22 0433  NA 141   < > 137 142 138  K 2.9*   < >  3.2* 3.3* 3.1*  CL 104   < > 105 103 98  CO2 22   < > 22 29 31   GLUCOSE 83   < > 102* 110* 110*  BUN 64*   < > 46* 35* 26*  CREATININE 3.07*   < > 1.22* 1.21* 1.07*  CALCIUM 8.3*   <  > 7.9* 8.2* 8.1*  PROT 6.8  --   --   --   --   ALBUMIN 3.6  --   --   --   --   AST 46*  --   --   --   --   ALT 12  --   --   --   --   ALKPHOS 134*  --   --   --   --   BILITOT 1.3*  --   --   --   --   GFRNONAA 15*   < > 46* 46* 54*  ANIONGAP 15   < > 10 10 9    < > = values in this interval not displayed.        Hematology Last Labs       Recent Labs  Lab 06/25/22 0622 06/26/22 0540 06/27/22 0514  WBC 8.7 10.9* 9.9  RBC 3.06* 2.89* 3.36*  HGB 9.5* 9.2* 10.4*  HCT 28.7* 27.2* 31.3*  MCV 93.8 94.1 93.2  MCH 31.0 31.8 31.0  MCHC 33.1 33.8 33.2  RDW 14.5 14.3 14.2  PLT 72* 114* 153        Cardiac Enzymes  Last Labs      Recent Labs  Lab 06/21/22 2301 06/21/22 2358  TROPONINIHS 85* 91*         BNP Labs (Brief)          Component Value Date/Time    BNP 767 (H) 04/19/2011 0444      DDimer  Last Labs     Recent Labs  Lab 06/21/22 2301  DDIMER 8.22*        Lipids  Recent Labs       Lab Results  Component Value Date    CHOL 240 (H) 06/11/2018    HDL 79 06/11/2018    LDLCALC 150 (H) 06/11/2018    TRIG 53 06/11/2018    CHOLHDL 3.0 06/11/2018        ZOX0R  Recent Labs       Lab Results  Component Value Date    HGBA1C 5.0 06/29/2019        Radiology    US Abdomen Limited RUQ (LIVER/GB)   Result Date: 06/24/2022 CLINICAL DATA:  Abdominal pain EXAM: ULTRASOUND ABDOMEN LIMITED RIGHT UPPER QUADRANT COMPARISON:  None Available. FINDINGS: Gallbladder: Dilated gallbladder. Borderline wall thickening. No shadowing stones. There is some sludge. There is some adjacent fluid as well to the gallbladder. Common bile duct: Diameter: 6 mm Liver: Nodular contours of the liver with mild ascites. Right-sided pleural effusion. Portal vein is patent on color Doppler imaging with normal direction of blood flow towards the liver. Other: None. IMPRESSION: Distended gallbladder with sludge.  No stones. There is some wall thickening and adjacent fluid although  there is a nodular appearance of the liver with the ascites. Please correlate for chronic liver disease. Overall if there is further concern of acalculous cholecystitis with the dilatation and wall thickening, HIDA scan may be useful as the next step in the workup Electronically Signed   By: Karen Kays M.D.   On: 06/24/2022 12:05  Telemetry    RSR - Personally Reviewed   Cardiac Studies    2D Echocardiogram 06/27/2022   1. Left ventricular ejection fraction, by estimation, is 25 to 30%. Left  ventricular ejection fraction by 2D MOD biplane is 28.7 %. The left  ventricle has severely decreased function. The left ventricle demonstrates  global hypokinesis. Left ventricular  diastolic parameters are consistent with Grade II diastolic dysfunction  (pseudonormalization).   2. Right ventricular systolic function is mildly reduced. The right  ventricular size is normal. There is mildly elevated pulmonary artery  systolic pressure. The estimated right ventricular systolic pressure is  37.0 mmHg.   3. Left atrial size was moderately dilated.   4. A small pericardial effusion is present. There is no evidence of  cardiac tamponade. Moderate pleural effusion in the left lateral region.   5. The mitral valve is normal in structure. Moderate mitral valve  regurgitation. No evidence of mitral stenosis.   6. The aortic valve is normal in structure. Aortic valve regurgitation is  trivial. No aortic stenosis is present.   7. The inferior vena cava is normal in size with greater than 50%  respiratory variability, suggesting right atrial pressure of 3 mmHg.    Patient Profile     77 year old woman with history of dementia, hypertension, GERD, kidney stones, falls presenting to the hospital with sepsis, mental status changes, AKI/renal stone/hydronephrosis, hypotension, tachycardia and PAF.     Assessment & Plan    Paroxysmal atrial fibrillation Episodes of paroxysmal atrial fibrillation this  admission No A-fib events past 3 days on amiodarone 400 twice daily and metoprolol Atrial fibrillation likely contributing to CHF symptoms on Eliquis 5 twice daily after discussion with family who indicates she is not had falls in 3 years, careful, walks with a walker under their supervision at home amiodarone down to 200 mg daily given prolonged QT -Transition to metoprolol succinate 50 daily   Dilated cardiomyopathy Echocardiogram confirming ejection fraction 25 to 30% Not a good candidate for ischemic workup given dementia, debility unable to exclude ischemia versus nonischemic cardiomyopathy/tachycardia mediated cardiomyopathy -Appears euvolemic, has been transitioned off IV Lasix twice daily to Lasix 40 p.o. daily with potassium 20 daily -Continue Entresto 24/26 twice daily metoprolol succinate 50 daily Renal function stable   Hypokalemia Repleted   Acute renal failure/left hydronephrosis/ATN In the setting of renal stone, hydronephrosis, recent stent placement Renal function improving, creatinine now down to baseline 1.02   Dementia without behavioral problems Notes indicating history of dementia, lives with sister, [name], ambulates with walker at home, somewhat independent  patient walks with a walker, no recent falls  [facility] will sign off.   Medication Recommendations: Continue medications as above Other recommendations (labs, testing, etc): No further testing Follow up as an outpatient: Follow-up with cardiology clinic   Total encounter time more than 35 minutes  Greater than 50% was spent in counseling and coordination of care with the patient   For questions or updates, please contact  [facility] Please consult [email or web address] for contact info under        Signed, Julien Nordmann, MD  06/29/2022, 3:08 PM

## 2022-06-30 DIAGNOSIS — E872 Acidosis, unspecified: Secondary | ICD-10-CM | POA: Diagnosis not present

## 2022-06-30 DIAGNOSIS — R6520 Severe sepsis without septic shock: Secondary | ICD-10-CM | POA: Diagnosis not present

## 2022-06-30 DIAGNOSIS — A419 Sepsis, unspecified organism: Secondary | ICD-10-CM | POA: Diagnosis not present

## 2022-06-30 MED ORDER — POTASSIUM CHLORIDE CRYS ER 20 MEQ PO TBCR: 20.0000 meq | EXTENDED_RELEASE_TABLET | Freq: Every day | ORAL | 1 refills | Status: DC

## 2022-06-30 NOTE — Progress Notes (Signed)
Patient reports given to RN [place]. All questions are addressed via phone.

## 2022-06-30 NOTE — Plan of Care (Signed)
  Problem: Health Behavior/Discharge Planning: Goal: Ability to manage health-related needs will improve Outcome: Progressing   Problem: Safety: Goal: Ability to remain free from injury will improve Outcome: Progressing   Problem: Skin Integrity: Goal: Risk for impaired skin integrity will decrease Outcome: Progressing   Problem: Pain Managment: Goal: General experience of comfort will improve Outcome: Progressing   Problem: Elimination: Goal: Will not experience complications related to bowel motility Outcome: Progressing

## 2022-06-30 NOTE — Progress Notes (Signed)
Occupational Therapy Treatment Patient Details Name: Alison Price MRN: [id number] DOB: 10/16/1945 Today's Date: 06/30/2022   History of present illness Patient is a 77 yo female that presented to ED for AMS, workup for sepsis, acute renal insufficiency and afib. PMH of anemia, GERD, HTN, stevens-johnson syndrome.  S/P CYSTOSCOPY WITH URETEROSCOPY AND STENT PLACEMENT 5/8.   OT comments  Patient received supine in bed and agreeable to OT with encouragement. Therapist modified session due to pt's baseline cognitive deficits. Oriented to first name only and required multimodal cues for all tasks this date. She required Max A to come to EOB. Once sitting EOB, attempted to engage pt in grooming tasks and ultimately required Max A. Pt with heavy posterior lean in sitting requiring physical assistance to correct. Pt fatigued quickly and required total A to return to supine. Pt left with all needs in reach. D/C recommendation remains appropriate. OT will continue to follow acutely.    Recommendations for follow up therapy are one component of a multi-disciplinary discharge planning process, led by the attending physician.  Recommendations may be updated based on patient status, additional functional criteria and insurance authorization.    Assistance Recommended at Discharge Frequent or constant Supervision/Assistance  Patient can return home with the following  A lot of help with bathing/dressing/bathroom;Assistance with cooking/housework;Assist for transportation;Help with stairs or ramp for entrance;Direct supervision/assist for financial management;Direct supervision/assist for medications management;Assistance with feeding;Two people to help with walking and/or transfers   Equipment Recommendations  Other (comment) (defer to next venue of care)    Recommendations for Other Services      Precautions / Restrictions Precautions Precautions: Fall Restrictions Weight Bearing Restrictions: No        Mobility Bed Mobility Overal bed mobility: Needs Assistance Bed Mobility: Supine to Sit, Sit to Supine, Rolling Rolling: Max assist   Supine to sit: Max assist, HOB elevated Sit to supine: Total assist        Transfers Overall transfer level: Needs assistance                 General transfer comment: deferred due to safety/cognition     Balance Overall balance assessment: Needs assistance Sitting-balance support: Feet supported Sitting balance-Leahy Scale: Poor Sitting balance - Comments: Pt required varying levels of assistance to maintain static sitting balance, CGA-Max A. Heavy posterior lean Postural control: Posterior lean       ADL either performed or assessed with clinical judgement   ADL Overall ADL's : Needs assistance/impaired     Grooming: Maximal assistance;Sitting;Wash/dry face Grooming Details (indicate cue type and reason): Attempted grooming tasks while sitting EOB, limited ability to initiate/participate in task despite multimodal cues from therapist. Ultimately required Max A.        Extremity/Trunk Assessment              Vision Patient Visual Report: No change from baseline     Perception     Praxis      Cognition Arousal/Alertness: Awake/alert Behavior During Therapy: Restless, Anxious Overall Cognitive Status: History of cognitive impairments - at baseline       General Comments: Oriented to name only, somewhat tearful t/o, Max VC to have pt attempt to describe pain/symptoms, inconsistently followed single step commands        Exercises      Shoulder Instructions       General Comments      Pertinent Vitals/ Pain       Pain Assessment Pain Assessment: Faces Faces Pain Scale:  Hurts a little bit Pain Location: LLE/hip Pain Descriptors / Indicators: Discomfort, Aching, Grimacing, Guarding Pain Intervention(s): Limited activity within patient's tolerance, Monitored during session, Repositioned  Home  Living        Prior Functioning/Environment              Frequency  Min 1X/week        Progress Toward Goals  OT Goals(current goals can now be found in the care plan section)  Progress towards OT goals: Progressing toward goals  Acute Rehab OT Goals Patient Stated Goal: none stated OT Goal Formulation: Patient unable to participate in goal setting Time For Goal Achievement: 07/08/22 Potential to Achieve Goals: Fair  Plan Discharge plan remains appropriate;Frequency remains appropriate    Co-evaluation                 AM-PAC OT "6 Clicks" Daily Activity     Outcome Measure   Help from another person eating meals?: A Lot Help from another person taking care of personal grooming?: A Lot Help from another person toileting, which includes using toliet, bedpan, or urinal?: Total Help from another person bathing (including washing, rinsing, drying)?: A Lot Help from another person to put on and taking off regular upper body clothing?: A Lot Help from another person to put on and taking off regular lower body clothing?: Total 6 Click Score: 10    End of Session    OT Visit Diagnosis: Unsteadiness on feet (R26.81);Repeated falls (R29.6);Muscle weakness (generalized) (M62.81)   Activity Tolerance Other (comment);Patient limited by pain (cognition)   Patient Left in bed;with call bell/phone within reach;with bed alarm set   Nurse Communication Mobility status        Time: 1610-9604 OT Time Calculation (min): 15 min  Charges: OT General Charges $OT Visit: 1 Visit OT Treatments $Self Care/Home Management : 8-22 mins  [facility] MS, OTR/L ascom [phone]  06/30/22, 1:21 PM

## 2022-06-30 NOTE — TOC Transition Note (Signed)
Transition of Care [facility]) - CM/SW Discharge Note   Patient Details  Name: Alison Price MRN: [id number] Date of Birth: 03/27/1945  [facility] CM/SW Contact:  Allena Katz, LCSW Phone Number: 06/30/2022, 10:02 AM   Clinical Narrative:   Pt has orders to discharge to [facility]. Medical Necessity printed to the unit. RN given number for report. Marcelino Duster with [facility] notified. CSW will send dc summary once in. [facility] to be called once summary is in.     Final next level of care: Skilled Nursing Facility Barriers to Discharge: Barriers Resolved   Patient Goals and CMS Choice CMS Medicare.gov Compare Post Acute Care list provided to:: Patient Represenative (must comment) (brenda Scicchitano- sister)    Discharge Placement                Patient chooses bed at:  [facility] rehab) Patient to be transferred to facility by: [facility] Name of family member notified: Nala, Steinmeyer (Sister) [phone] (Mobile) Patient and family notified of of transfer: 06/29/22  Discharge Plan and Services Additional resources added to the After Visit Summary for                  DME Arranged: N/A DME Agency: NA       HH Arranged: NA HH Agency: NA        Social Determinants of Health (SDOH) Interventions SDOH Screenings   Tobacco Use: Medium Risk (06/26/2022)     Readmission Risk Interventions     No data to display

## 2022-06-30 NOTE — Care Management Important Message (Signed)
Important Message  Patient Details  Name: Alison Price MRN: [id number] Date of Birth: 1945/09/14   Medicare Important Message Given:  Yes     Olegario Messier A Zayveon Raschke 06/30/2022, 11:29 AM

## 2022-07-01 NOTE — Telephone Encounter (Signed)
Talked with [name] and she states that Alison Price is in a home and is not able to come in right now. They will call back after she comes home. Patient is having some heart problems right now.

## 2022-07-01 NOTE — Telephone Encounter (Signed)
They called back and she has a appt with DR. Stoioff on 07/10/2022 at 11;30 to talk

## 2022-07-10 ENCOUNTER — Ambulatory Visit: Payer: Medicare Other | Admitting: Urology

## 2022-07-10 ENCOUNTER — Telehealth: Payer: Self-pay | Admitting: Urology

## 2022-07-10 NOTE — Telephone Encounter (Signed)
Pt's sister came to office today waiting on pt for her appt.  She said [facility] was supposed to be bringing pt for appt.  They never showed up and when she called the facility, the said they knew nothing about this appt.

## 2022-08-04 ENCOUNTER — Telehealth: Payer: Self-pay | Admitting: *Deleted

## 2022-08-04 NOTE — Telephone Encounter (Signed)
-----   Message from Riki Altes, MD sent at 08/03/2022 11:36 AM EDT ----- Regarding: Appointment Established patient of Dr. Richardo Hanks.  Please schedule follow-up appointment to discuss ureteroscopy

## 2022-08-04 NOTE — Telephone Encounter (Signed)
Notified patient sister  as instructed, Sister stay she is in a nursing home and the home will not bring her to her appointments.

## 2022-08-08 NOTE — Telephone Encounter (Signed)
LMOM for sister, [name] to call office and let us know facility and phone number where pt is currently staying.

## 2022-09-09 ENCOUNTER — Telehealth: Payer: Self-pay | Admitting: Urology

## 2022-09-09 NOTE — Telephone Encounter (Signed)
Alison Price had a stent placed while she was hospitalized in May and she needs to discuss definitive stone management.  It looks like the staff has been trying to coordinate this, but it appears that she lives in a facility and suffers from dementia.  Would you help get this patient seen with Dr. Richardo Hanks?

## 2022-09-09 NOTE — Telephone Encounter (Signed)
Pt is at [facility]  [phone]  Kemper Durie has gone for the day- Diane ext [phone]. Will try to call tomorrow.

## 2022-09-11 ENCOUNTER — Encounter: Payer: Self-pay | Admitting: Oncology

## 2022-09-11 NOTE — Telephone Encounter (Signed)
Incoming call from Diane---  Appt made for 09/24/22 115 in [facility] with [facility].   LM informing sisterSteward Drone of appt.

## 2022-09-11 NOTE — Telephone Encounter (Signed)
Called Alison Price place got transferred to Alison Price. Unable to leave vm. Phone disconnects.   Called x 2 time Alison Price (receptionist) gave me Alison Price (admin) number [phone].   LM asking for a return call. Advised I have tried to call over the last 3 days to make an appt. Have not been able to speak to anyone that could help.

## 2022-09-24 ENCOUNTER — Ambulatory Visit (INDEPENDENT_AMBULATORY_CARE_PROVIDER_SITE_OTHER): Payer: Medicare Other | Admitting: Urology

## 2022-09-24 ENCOUNTER — Other Ambulatory Visit
Admission: RE | Admit: 2022-09-24 | Discharge: 2022-09-24 | Disposition: A | Discharge status: Home / Self Care | Payer: Medicare Other | Attending: Urology | Admitting: Urology

## 2022-09-24 ENCOUNTER — Encounter: Payer: Self-pay | Admitting: Urology

## 2022-09-24 ENCOUNTER — Other Ambulatory Visit: Payer: Self-pay

## 2022-09-24 VITALS — BP 96/49 | HR 62

## 2022-09-24 DIAGNOSIS — N200 Calculus of kidney: Secondary | ICD-10-CM

## 2022-09-24 NOTE — Patient Instructions (Signed)
Laser Therapy for Kidney Stones Laser therapy for kidney stones is a procedure to break up rock-like masses that form inside the kidneys (kidney stones). It is done using a device that beams a strong light (laser) on the kidney stones. This breaks the stones up into small pieces. These small pieces may leave your body when you pee (urinate) or may be taken out during the procedure.  You may need laser therapy if you have kidney stones that are painful or that are stopping you from being able to pee. Tell a health care provider about: Any allergies you have. All medicines you are taking, including vitamins, herbs, eye drops, creams, and over-the-counter medicines. Any problems you or family members have had with anesthesia. Any bleeding problems you have. Any surgeries you have had. Any medical conditions you have. Whether you are pregnant or may be pregnant. What are the risks? Your health care provider will talk with you about risks. These may include: Infection. Bleeding. Allergic reactions to medicines. Damage to: The part of your body that drains pee (urine) from the bladder (urethra). The bladder. The tube that connects the bladder to the kidneys (ureter). Urinary tract infection (UTI). Urethral stricture. This is when the urethra is narrowed by scarring. Trouble peeing. Blockage of the kidney. This may be caused by a piece of kidney stone. What happens before the procedure? When to stop eating and drinking Follow instructions from your provider about what you may eat and drink. These may include: 8 hours before the procedure Stop eating most foods. Do not eat meat, fried foods, or fatty foods. Eat only light foods, such as toast or crackers. All liquids are okay except energy drinks and alcohol. 6 hours before the procedure Stop eating. Drink only clear liquids, such as water, clear fruit juice, black coffee, plain tea, and sports drinks. Do not drink energy drinks or  alcohol. 2 hours before the procedure Stop drinking all liquids. You may be allowed to take medicines with small sips of water. If you do not follow your provider's instructions, your procedure may be delayed or canceled. Medicines Ask your provider about: Changing or stopping your regular medicines. These include any diabetes medicines or blood thinners you take. Taking medicines such as aspirin and ibuprofen. These medicines can thin your blood. Do not take them unless your provider tells you to. Taking over-the-counter medicines, vitamins, herbs, and supplements. Tests You may have a physical exam before the procedure. You may also have tests done. These may include: Imaging tests. Blood or pee tests. Surgery safety Ask your provider: How your surgery site will be marked. What steps will be taken to help prevent infection. These steps may include: Removing hair at the surgery site. Washing skin with a soap that kills germs. Taking antibiotics. General instructions Do not use any products that contain nicotine or tobacco for at least 4 weeks before the procedure. These products include cigarettes, chewing tobacco, and vaping devices, such as e-cigarettes. If you need help quitting, ask your provider. If you will be going home right after the procedure, plan to have a responsible adult: Take you home from the hospital or clinic. You will not be allowed to drive. Care for you for the time you are told. What happens during the procedure?  An IV will be inserted into one of your veins. You will be given: A sedative. This helps you relax. Anesthesia. This keeps you from feeling pain. It will make you fall asleep for surgery. A tool   with a camera on the end (ureteroscope) will be put into your urethra. It will be moved through your bladder to your kidney. It will send pictures to a screen in the operating room. This will show what parts of your kidney need to be treated. A tube will be  put through the ureteroscope. It will be moved into your kidney. The laser device will be put into your kidney through the tube. The laser will be used to break up the kidney stones. A tool with a tiny wire basket may be put through the tube into your kidney. This can help remove the small pieces of the kidney stone. A small mesh tube (stent) may be placed to allow your kidney to drain. The tube and ureteroscope will be taken out at the end of the surgery. The procedure may vary among providers and hospitals. What happens after the procedure? Your blood pressure, heart rate, breathing rate, and blood oxygen level will be monitored until you leave the hospital or clinic. If you had a stent placed, it may have a string that will be secured to your skin. This helps your provider remove the stent. You may be given a strainer to collect any stone pieces that you pass in your pee. Your provider may have these tested. This information is not intended to replace advice given to you by your health care provider. Make sure you discuss any questions you have with your health care provider. Document Revised: 10/04/2021 Document Reviewed: 10/04/2021 Elsevier Patient Education  2024 [employer].  

## 2022-09-24 NOTE — Progress Notes (Signed)
   09/24/2022 1:49 PM   Alison Price February 06, 1946 [id number]  Reason for visit: Follow up nephrolithiasis, sepsis  HPI: Very frail 77 year old female with dementia who resides at [facility] here with her sister today for follow-up of recent kidney stone and sepsis from urinary source requiring ureteral stent placement with Dr. Lonna Cobb.  She underwent right ureteral stent placement urgently with me in February 2022 for UTI/sepsis and a 1 cm right proximal ureteral stone and underwent follow-up ureteroscopy the next month, she was then lost to follow-up after being seen last by me in October 2022 for surveillance of her 1.5cm left lower pole stone.  She presented in May 2024 with sepsis from urinary source, CT ultimately showed a 1.5 cm left UPJ stone and she underwent urgent stent placement with Dr. Lonna Cobb for drainage.  Urine grew E. coli and she was treated with culture appropriate antibiotics.  She has tolerated the stent well.  There have been problems with coordinating follow-up with her dementia and nursing home.  Hospital notes were reviewed extensively.  She seems to be tolerating the stent well, but history challenging with her dementia.  Her sister helps provide most of the history which is corroborated with hospitalization notes.  We discussed need for definitive management with ureteroscopy, laser lithotripsy, and stent change.  Her stone is borderline for being able to be managed with ureteroscopy and there is also a large left lower pole stone, however I do not think she would tolerate PCNL, and with her being prestented can use an access sheath and higher likelihood of stone free procedure. We specifically discussed the risks ureteroscopy including bleeding, infection/sepsis, stent related symptoms including flank pain/urgency/frequency/incontinence/dysuria, ureteral injury, inability to access stone, or need for staged or additional procedures.  Schedule left ureteroscopy, laser  lithotripsy, stent change Urinalysis and culture today  I spent 40 total minutes on the day of the encounter including pre-visit review of the medical record, face-to-face time with the patient, and post visit ordering of labs/imaging/tests.   Sondra Come, MD  [facility] [address], [address] [place], [place] [address] [phone]

## 2022-09-24 NOTE — Progress Notes (Signed)
Surgical Physician Order Form [facility] [place]  Dr. Richardo Hanks, MD  * Scheduling expectation : Next Available  *Length of Case: 1.5 hours  *Clearance needed: no  *Anticoagulation Instructions: May continue all anticoagulants  *Aspirin Instructions: Ok to continue all  *Post-op visit Date/Instructions: 2-week stent removal  *Diagnosis: Left Nephrolithiasis  *Procedure: left  Ureteroscopy w/laser lithotripsy & stent exchange (84696)   Additional orders: N/A  -Admit type: OUTpatient  -Anesthesia: General  -VTE Prophylaxis Standing Order SCD's       Other:   -Standing Lab Orders Per Anesthesia    Lab other: UA&Urine Culture  -Standing Test orders EKG/Chest x-ray per Anesthesia       Test other:   - Medications:  Ancef 2gm IV  -Other orders:  N/A

## 2022-09-26 ENCOUNTER — Telehealth: Payer: Self-pay

## 2022-09-26 NOTE — Telephone Encounter (Signed)
I spoke with Ms. Spring's Sister [name] as well as [employer]. We have discussed possible surgery dates and Friday September 6th, 2024 was agreed upon by all parties. Patient given information about surgery date, what to expect pre-operatively and post operatively.  We discussed that a Pre-Admission Testing office will be calling to set up the pre-op visit that will take place prior to surgery, and that these appointments are typically done over the phone with a Pre-Admissions RN. Informed patient that our office will communicate any additional care to be provided after surgery. Patients questions or concerns were discussed during our call. Advised to call our office should there be any additional information, questions or concerns that arise. Patient verbalized understanding.

## 2022-09-26 NOTE — Progress Notes (Signed)
   [facility] Surgical Posting Form  Surgery Date: Date: 10/24/2022  Surgeon: Dr. Legrand Rams, MD  Inpt ( No  )   Outpt (Yes)   Obs ( No  )   Diagnosis: N20.0 Left Nephrolithiasis  -CPT: [id number]  Surgery: Left Ureteroscopy with Laser Lithotripsy and Stent Exchange  Stop Anticoagulations: No, may continue all   Cardiac/Medical/Pulmonary Clearance needed: no  *Orders entered into EPIC  Date: 09/26/22   *Case booked in EPIC  Date: 09/26/22  *Notified pt of Surgery: Date: 09/26/22  PRE-OP UA & CX: yes, will obtain at [facility]   *Placed into Prior Authorization Work Que Date: 09/26/22  Assistant/laser/rep:No

## 2022-09-29 ENCOUNTER — Encounter: Payer: Self-pay | Admitting: Oncology

## 2022-10-16 ENCOUNTER — Encounter
Admission: RE | Admit: 2022-10-16 | Discharge: 2022-10-16 | Disposition: A | Discharge status: Home / Self Care | Payer: Medicare Other | Source: Ambulatory Visit | Attending: Urology | Admitting: Urology

## 2022-10-16 ENCOUNTER — Encounter: Payer: Self-pay | Admitting: Urgent Care

## 2022-10-16 HISTORY — DX: Sepsis due to Escherichia coli (e. coli): A41.51

## 2022-10-16 HISTORY — DX: Other cervical disc degeneration, unspecified cervical region: M50.30

## 2022-10-16 HISTORY — DX: Long term (current) use of anticoagulants: Z79.01

## 2022-10-16 HISTORY — DX: Dysphagia, unspecified: R13.10

## 2022-10-16 HISTORY — DX: Atherosclerosis of renal artery: I70.1

## 2022-10-16 HISTORY — DX: Other long term (current) drug therapy: Z79.899

## 2022-10-16 HISTORY — DX: Atherosclerosis of aorta: I70.0

## 2022-10-16 HISTORY — DX: Other intervertebral disc degeneration, lumbar region without mention of lumbar back pain or lower extremity pain: M51.369

## 2022-10-16 HISTORY — DX: Sepsis due to Escherichia coli (e. coli): R65.20

## 2022-10-16 HISTORY — DX: Scoliosis, unspecified: M41.9

## 2022-10-16 HISTORY — DX: Chronic kidney disease, stage 3 unspecified: N18.30

## 2022-10-16 NOTE — Patient Instructions (Addendum)
Your procedure is scheduled on: Friday, September 6 Report to the Registration Desk on the 1st floor of the [facility] To find out your arrival time, please call [phone] between 1PM - 3PM on: Thursday, September 5 If your arrival time is 6:00 am, do not arrive before that time as the [facility] Mall entrance doors do not open until 6:00 am.  REMEMBER: Instructions that are not followed completely may result in serious medical risk, up to and including death; or upon the discretion of your surgeon and anesthesiologist your surgery may need to be rescheduled.  Do not eat or drink after midnight the night before surgery.  No gum chewing or hard candies.  Per Dr. Keane Scrape note: continue all anticoagulants and aspirin.  TAKE ONLY THESE MEDICATIONS THE MORNING OF SURGERY WITH A SIP OF WATER:  Amiodarone Divalproex (Depakote) Donepezil (Aricept) Escitalopram (Lexapro) Metoprolol Potassium chloride  Please bring medication record indicating when last dose of medication was.  On the morning of surgery brush your teeth with toothpaste and water, you may rinse your mouth with mouthwash if you wish. Do not swallow any toothpaste or mouthwash.  Do not wear jewelry, make-up, hairpins, clips or nail polish.  Do not wear lotions, powders, or perfumes.   Do not shave body hair from the neck down 48 hours before surgery.  Contact lenses, hearing aids and dentures may not be worn into surgery.  Do not bring valuables to the hospital. [facility] is not responsible for any missing/lost belongings or valuables.   Notify your doctor if there is any change in your medical condition (cold, fever, infection).  Wear comfortable clothing (specific to your surgery type) to the hospital.  If you are being discharged the day of surgery, you will not be allowed to drive home. You will need a responsible individual to drive you home and stay with you for 24 hours after surgery.   If you are  taking public transportation, you will need to have a responsible individual with you.  Please call the Pre-admissions Testing Dept. at [phone] if you have any questions about these instructions.  Surgery Visitation Policy:  Patients having surgery or a procedure may have two visitors.  Children under the age of 74 must have an adult with them who is not the patient.

## 2022-10-16 NOTE — Pre-Procedure Instructions (Signed)
Spoke with sister, [place]. She is planning on being present on the day of surgery for [facility]. [name] stated that she has recent documents indicating that she has medical power of attorney and she will bring these documents to the hospital on the day of surgery. Pre-surgery instructions faxed to [facility] at [facility].

## 2022-10-17 ENCOUNTER — Telehealth: Payer: Self-pay | Admitting: *Deleted

## 2022-10-17 NOTE — Telephone Encounter (Signed)
   Name: Alison Price  DOB: Apr 19, 1945  MRN: [id number]  Primary Cardiologist: Julien Nordmann, MD  Chart reviewed as part of pre-operative protocol coverage. Because of [name]'s past medical history and time since last visit, she will require a follow-up in-office visit in order to better assess preoperative cardiovascular risk.  Hospitalized in May 2024, cardiology was consulted, patient has not been seen in follow-up since.  Pre-op covering staff: - Please schedule appointment and call patient to inform them. If patient already had an upcoming appointment within acceptable timeframe, please add "pre-op clearance" to the appointment notes so provider is aware. - Please contact requesting surgeon's office via preferred method (i.e, phone, fax) to inform them of need for appointment prior to surgery.  This message will also be routed to pharmacy pool for input on holding Eliquis as requested below so that this information is available to the clearing provider at time of patient's appointment.   Joylene Grapes, NP  10/17/2022, 2:59 PM

## 2022-10-17 NOTE — Telephone Encounter (Signed)
Patient with diagnosis of afib on Eliquis for anticoagulation.    Procedure: CYSTOSCOPY/URETEROSCOPY/HOLMIUM LASER/STENT EXCHANGE  Date of procedure: 10/24/22  CHA2DS2-VASc Score = 5   This indicates a 7.2% annual risk of stroke. The patient's score is based upon: CHF History: 1 HTN History: 1 Diabetes History: 0 Stroke History: 0 Vascular Disease History: 0 Age Score: 2 Gender Score: 1      CrCl 43 ml/min Platelet count 215  Per office protocol, patient can hold Eliquis for 2-3 days prior to procedure.    **This guidance is not considered finalized until pre-operative APP has relayed final recommendations.**

## 2022-10-17 NOTE — Telephone Encounter (Signed)
-----   Message from Verlee Monte sent at 10/16/2022  1:37 PM EDT ----- Regarding: Request for pre-operative cardiac clearance Request for pre-operative cardiac clearance:  1. What type of surgery is being performed?  CYSTOSCOPY/URETEROSCOPY/HOLMIUM LASER/STENT EXCHANGE  2. When is this surgery scheduled?  10/24/2022  3. Type of clearance being requested (medical, pharmacy, both)? BOTH   4. Are there any medications that need to be held prior to surgery? 10/24/2022  5. Practice name and name of physician performing surgery?  Performing surgeon: Dr. Legrand Rams, MD Requesting clearance: Quentin Mulling, FNP-C    6. Anesthesia type (none, local, MAC, general)? GENERAL  7. What is the office phone and fax number?   Phone: [phone] Fax: [phone]  ATTENTION: Unable to create telephone message as per your standard workflow. Directed by [facility] providers to send requests for cardiac clearance to this pool for appropriate distribution to provider covering pre-operative clearances.   Quentin Mulling, MSN, APRN, FNP-C, C[facility]  Peri-operative Services Nurse Practitioner Phone: [phone] 10/16/22 1:37 PM

## 2022-10-17 NOTE — Telephone Encounter (Signed)
Lmtcb to schedule in office appt for pre op clearance.

## 2022-10-21 ENCOUNTER — Encounter: Payer: Self-pay | Admitting: Urology
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
## 2022-10-21 NOTE — Telephone Encounter (Signed)
Left message to call back ASAP. Pt just s/w scheduler and was scheduled for 11/14/22 in our [place] office. I did call back to let her know that we just got a sooner appt for tomorrow in [place]. Left message to call back ASAP if she wants a sooner appt, not sure they will be available when she calls back though.   I will update the requesting office pt needs in office appt.

## 2022-10-21 NOTE — Telephone Encounter (Signed)
Pt called back and scheduled sooner appt for pre op clearance to see Charlsie Quest, NP 10/22/22.

## 2022-10-22 ENCOUNTER — Ambulatory Visit: Payer: Medicare Other | Attending: Cardiology | Admitting: Cardiology

## 2022-10-22 ENCOUNTER — Encounter: Payer: Self-pay | Admitting: Cardiology

## 2022-10-22 VITALS — BP 123/64 | HR 50 | Ht 70.0 in | Wt 135.0 lb

## 2022-10-22 DIAGNOSIS — Z01810 Encounter for preprocedural cardiovascular examination: Secondary | ICD-10-CM | POA: Diagnosis not present

## 2022-10-22 DIAGNOSIS — I480 Paroxysmal atrial fibrillation: Secondary | ICD-10-CM | POA: Diagnosis not present

## 2022-10-22 DIAGNOSIS — I5020 Unspecified systolic (congestive) heart failure: Secondary | ICD-10-CM | POA: Diagnosis not present

## 2022-10-22 DIAGNOSIS — I10 Essential (primary) hypertension: Secondary | ICD-10-CM | POA: Diagnosis not present

## 2022-10-22 NOTE — Progress Notes (Signed)
Cardiology Office Note:  .   Date:  10/22/2022  ID:  Alison Price, DOB September 27, 1945, MRN [id number] PCP: Jaclyn Shaggy, MD  [facility] Providers Cardiologist:  Julien Nordmann, MD    History of Present Illness: .   Alison Price is a 77 y.o. female with a past medical history of dementia, hypertension, GERD, nephrolithiasis, recurrent falls, and new onset paroxysmal atrial fibrillation, who is here today for follow-up of her paroxysmal atrial fibrillation as well as her chronic preoperative cardiovascular examination.  Patient previously undergone right stent placement for obstructing right proximal ureteral stone with sepsis in February 2022 and subsequently underwent ureteroscopic stone removal 04/2020.  Prior to that she had no prior cardiac history.  She presented to the [facility] emergency department 06/21/22 with altered mental status.  She was noted to be hypertensive and tachycardic and had leukocytosis with AKI and was admitted to ICU for severe sepsis from UTI, new onset atrial fibrillation, AMS, AKI, and possible pneumonia.  Atrial fibrillation with heart rates into the 170s started on IV amiodarone with conversion to normal sinus rhythm.  IV amiodarone was subsequently stopped 5/5.  Urine culture showed E. coli.  She was transferred to [facility] 06/26/2022.  CT scan showed an obstructing 2 cm left renal stone with moderate hydronephrosis.  She underwent left stent placement on 06/25/2022.  Postprocedure she was noted to have recurrent atrial fibrillation was given IV metoprolol with conversion to normal sinus rhythm.  Echocardiogram completed revealed LVEF of 25 to 30%, severely decreased function, G2 DD, mildly elevated pulmonary artery systolic pressure, small pericardial effusion without evidence of tamponade, moderate pleural effusion on the left lateral region, moderate mitral valve regurgitation, and trivial aortic valve regurgitation.  She was maintained on oral amiodarone and  metoprolol as well as apixaban was started for CHA2DS2-VASc score of at least 6.  She was started on Entresto and metoprolol which she tolerated well during hospitalization.  She was subsequently considered stable for discharge and was discharged to this now of for rehab on 06/30/2022.  She returns to clinic today accompanied by a family member. She states that she has been doing well. She continues to reside at [facility]. Patient has a baseline with dementia. Family states that she has not had any complaints of chest pain, shortness of breath, palpitations, or issues with bleeding. Patient states that she has had no blood in her stool or urine. Vital signs have been stable since discharge with heart rates 50-70 bpm. She is also needing to have stent removal completed this Friday by Urology.She has not had any visits to the emergency department or hospitalizations since her discharge in May 2024.  ROS: 10 point review of systems has been reviewed and considered negative with exception of what is been listed in the HPI  Studies Reviewed: Marland Kitchen   EKG Interpretation Date/Time:  Wednesday October 22 2022 09:12:43 EDT Ventricular Rate:  50 PR Interval:  158 QRS Duration:  90 QT Interval:  492 QTC Calculation: 448 R Axis:   -14  Text Interpretation: Sinus bradycardia Minimal voltage criteria for LVH, may be normal variant ( R in aVL ) When compared with ECG of 26-Jun-2022 06:56, Questionable change in QRS axis Nonspecific T wave abnormality no longer evident in Anterolateral leads QT has shortened Confirmed by Charlsie Quest ([id number]) on 10/22/2022 9:24:52 AM   TTE 06/26/22 1. Left ventricular ejection fraction, by estimation, is 25 to 30%. Left  ventricular ejection fraction by 2D MOD biplane is  28.7 %. The left  ventricle has severely decreased function. The left ventricle demonstrates  global hypokinesis. Left ventricular  diastolic parameters are consistent with Grade II diastolic dysfunction   (pseudonormalization).   2. Right ventricular systolic function is mildly reduced. The right  ventricular size is normal. There is mildly elevated pulmonary artery  systolic pressure. The estimated right ventricular systolic pressure is  37.0 mmHg.   3. Left atrial size was moderately dilated.   4. A small pericardial effusion is present. There is no evidence of  cardiac tamponade. Moderate pleural effusion in the left lateral region.   5. The mitral valve is normal in structure. Moderate mitral valve  regurgitation. No evidence of mitral stenosis.   6. The aortic valve is normal in structure. Aortic valve regurgitation is  trivial. No aortic stenosis is present.   7. The inferior vena cava is normal in size with greater than 50%  respiratory variability, suggesting right atrial pressure of 3 mmHg.   Risk Assessment/Calculations:    CHA2DS2-VASc Score = 5   This indicates a 7.2% annual risk of stroke. The patient's score is based upon: CHF History: 1 HTN History: 1 Diabetes History: 0 Stroke History: 0 Vascular Disease History: 0 Age Score: 2 Gender Score: 1            Physical Exam:   VS:  BP 123/64 (BP Location: Right Arm, Patient Position: Sitting, Cuff Size: Normal)   Pulse (!) 50   Ht 5\' 10"  (1.778 m)   Wt 135 lb (61.2 kg)   SpO2 92%   BMI 19.37 kg/m    Wt Readings from Last 3 Encounters:  10/22/22 135 lb (61.2 kg)  10/16/22 130 lb 12.8 oz (59.3 kg)  06/29/22 219 lb 12.8 oz (99.7 kg)    GEN: Well nourished, well developed in no acute distress NECK: No JVD; No carotid bruits CARDIAC: RRR, bradycardiac, no murmurs, rubs, gallops RESPIRATORY:  Clear to auscultation without rales, wheezing or rhonchi  ABDOMEN: Soft, non-tender, non-distended EXTREMITIES:  No edema; No deformity   ASSESSMENT AND PLAN: .   Paroxysmal atrial fibrillation found during recent hospitalization in the setting of sepsis, AKI, nephrolithiasis/hydronephrosis status post urethral stenting.   She was continued on apixaban 5 mg twice daily for CHA2DS2-VASc score of at least 5 for stroke prophylaxis.  Continued on amiodarone 200 mg daily.  She has not had any bleeding or noted blood in her urine or stool.  EKG today revealed sinus bradycardia with a rate of 58 with LVH with no acute changes from prior studies.  She is also being continued on Toprol-XL 50 mg as further evaluation of vital signs and [facility] did reveal heart rates ranging from 50-70 bpm.  HFrEF with last LVEF of 25-30% on recent hospitalization.  G2 DD, moderate mitral regurgitation.  Patient denies any shortness of breath.  She appears to be euvolemic on exam.  She is continued on Entresto 24/26 mg twice daily, Toprol-XL 50 mg daily.  She is not a good candidate for SGLT2 inhibitor due to sedentary lifestyle.  Continue to escalate GDMT as tolerated by blood pressure and kidney function.  Repeat echocardiogram ordered.  Primary hypertension with blood pressure 123/64.  Blood pressures remain stable.  She has been continued on her current medication regimen unchanged at this time.  Continue to monitor blood pressures at the facility at minimum of daily.  Preoperative cardiovascular exam point patient denies any chest pain, shortness of breath.  Has not had any  bleeding noted with apixaban.  We did receive correspondence this morning that primary urologist does not require apixaban to be on hold for procedure.  She requires no further cardiovascular testing prior to her procedure.  She is okay for procedure acceptable risk.  Would recommend limiting the amount of fluid that she is given during the procedure due to her low EF.       Ms. Ludd perioperative risk of a major cardiac event is 0.9% according to the Revised Cardiac Risk Index (RCRI).  Therefore, she is at low risk for perioperative complications.   Her functional capacity is poor at 3.08 METs according to the Duke Activity Status Index  (DASI). Recommendations: According to ACC/AHA guidelines, no further cardiovascular testing needed.  The patient may proceed to surgery at acceptable risk.    Per B.Gray, NP no need to hold Eliquis per Urologist.    Dispo: Patient to return to clinic to see MD/APP in 3 months or sooner if needed.  Most recent labs have been requested from the facility to follow-up on CBC and renal function.  Signed, Raisa Ditto, NP

## 2022-10-22 NOTE — Patient Instructions (Addendum)
 Medication Instructions:  Your physician recommends that you continue on your current medications as directed. Please refer to the Current Medication list given to you today.  *If you need a refill on your cardiac medications before your next appointment, please call your pharmacy*   Lab Work: none If you have labs (blood work) drawn today and your tests are completely normal, you will receive your results only by: MyChart Message (if you have MyChart) OR A paper copy in the mail If you have any lab test that is abnormal or we need to change your treatment, we will call you to review the results.   Testing/Procedures: Your physician has requested that you have an echocardiogram. Echocardiography is a painless test that uses sound waves to create images of your heart. It provides your doctor with information about the size and shape of your heart and how well your heart's chambers and valves are working.   You may receive an ultrasound enhancing agent through an IV if needed to better visualize your heart during the echo. This procedure takes approximately one hour.  There are no restrictions for this procedure.  This will take place at [address] [address] [facility], [place] [address]    Follow-Up: At [facility], you and your health needs are our priority.  As part of our continuing mission to provide you with exceptional heart care, we have created designated Provider Care Teams.  These Care Teams include your primary Cardiologist (physician) and Advanced Practice Providers (APPs -  Physician Assistants and Nurse Practitioners) who all work together to provide you with the care you need, when you need it.  We recommend signing up for the patient portal called "MyChart".  Sign up information is provided on this After Visit Summary.  MyChart is used to connect with patients for Virtual Visits (Telemedicine).  Patients are able to view lab/test results, encounter  notes, upcoming appointments, etc.  Non-urgent messages can be sent to your provider as well.   To learn more about what you can do with MyChart, go to [email or web address].    Your next appointment:   3 month(s)  Provider:   Charlsie Quest, NP

## 2022-10-23 NOTE — Telephone Encounter (Signed)
Note is completed. She requires no further testing. Would limit the amount of IVF given during procedure due to low EF.

## 2022-10-24 ENCOUNTER — Ambulatory Visit: Admission: RE | Admit: 2022-10-24 | Payer: Medicare Other | Source: Ambulatory Visit | Admitting: Urology

## 2022-10-24 ENCOUNTER — Encounter: Admission: RE | Payer: Self-pay | Source: Ambulatory Visit

## 2022-10-24 ENCOUNTER — Telehealth: Payer: Self-pay

## 2022-10-24 HISTORY — DX: Unspecified dementia, unspecified severity, with mood disturbance: F03.93

## 2022-10-24 HISTORY — DX: Adverse effect of unspecified drugs, medicaments and biological substances, initial encounter: T50.905A

## 2022-10-24 HISTORY — DX: Unspecified protein-calorie malnutrition: E46

## 2022-10-24 HISTORY — DX: Depression, unspecified: F32.A

## 2022-10-24 HISTORY — DX: Insomnia, unspecified: G47.00

## 2022-10-24 SURGERY — CYSTOSCOPY/URETEROSCOPY/HOLMIUM LASER/STENT PLACEMENT
Anesthesia: General | Laterality: Left

## 2022-10-24 MED ORDER — FAMOTIDINE 20 MG PO TABS: 20.0000 mg | ORAL_TABLET | Freq: Once | ORAL | Status: DC

## 2022-10-24 MED ORDER — ORAL CARE MOUTH RINSE: 15.0000 mL | Freq: Once | OROMUCOSAL | Status: DC

## 2022-10-24 MED ORDER — CEFAZOLIN SODIUM-DEXTROSE 2-4 GM/100ML-% IV SOLN: 2.0000 g | INTRAVENOUS | Status: DC

## 2022-10-24 MED ORDER — CHLORHEXIDINE GLUCONATE 0.12 % MT SOLN: 15.0000 mL | Freq: Once | OROMUCOSAL | Status: DC

## 2022-10-24 MED ORDER — LACTATED RINGERS IV SOLN: INTRAVENOUS | Status: DC

## 2022-10-24 NOTE — Telephone Encounter (Addendum)
Tried calling patient sister, Patient cancelled surgery. Office or Surgeon was not made aware. Documentation within case mentions patient was having another appointment and was going to be late. Left detailed message for patient sister to call back to discuss.   Spoke with Sister who states that someone at front desk of Surgery told her that surgery had been pushed back about 2.5 hours. And she was going to be unable to wait due to another appointment. I have rescheduled patient to next Friday and Sister and Facility updated, as well as Dr. Richardo Hanks.

## 2022-10-24 NOTE — Anesthesia Preprocedure Evaluation (Addendum)
Anesthesia Evaluation    History of Anesthesia Complications (+) history of anesthetic complications  Airway        Dental   Pulmonary former smoker (quit 1988)          Cardiovascular hypertension, +CHF (EF 25-30%)  + dysrhythmias (a fib on Eliquis) + Valvular Problems/Murmurs (moderate MR)   ECG 10/22/22:  Sinus bradycardia Minimal voltage criteria for LVH, may be normal variant ( R in aVL )  Echo 06/26/22:  1. Left ventricular ejection fraction, by estimation, is 25 to 30%. Left ventricular ejection fraction by 2D MOD biplane is 28.7 %. The left ventricle has severely decreased function. The left ventricle demonstrates global hypokinesis. Left ventricular diastolic parameters are consistent with Grade II diastolic dysfunction (pseudonormalization).   2. Right ventricular systolic function is mildly reduced. The right ventricular size is normal. There is mildly elevated pulmonary artery systolic pressure. The estimated right ventricular systolic pressure is 37.0 mmHg.   3. Left atrial size was moderately dilated.   4. A small pericardial effusion is present. There is no evidence of cardiac tamponade. Moderate pleural effusion in the left lateral region.   5. The mitral valve is normal in structure. Moderate mitral valve regurgitation. No evidence of mitral stenosis.   6. The aortic valve is normal in structure. Aortic valve regurgitation is trivial. No aortic stenosis is present.   7. The inferior vena cava is normal in size with greater than 50% respiratory variability, suggesting right atrial pressure of 3 mmHg.    Neuro/Psych  PSYCHIATRIC DISORDERS  Depression   Dementia Chronic pain    GI/Hepatic   Endo/Other    Renal/GU Renal disease (stage III CKD)     Musculoskeletal  (+) Arthritis ,    Abdominal   Peds  Hematology  (+) Blood dyscrasia, anemia   Anesthesia Other Findings Reviewed and agree with Edd Fabian  pre-anesthesia clinical review note.    Cardiology note 10/22/22:  1. Paroxysmal atrial fibrillation found during recent hospitalization in the setting of sepsis, AKI, nephrolithiasis/hydronephrosis status post urethral stenting.  She was continued on apixaban 5 mg twice daily for CHA2DS2-VASc score of at least 5 for stroke prophylaxis.  Continued on amiodarone 200 mg daily.  She has not had any bleeding or noted blood in her urine or stool.  EKG today revealed sinus bradycardia with a rate of 58 with LVH with no acute changes from prior studies.  She is also being continued on Toprol-XL 50 mg as further evaluation of vital signs and [facility] did reveal heart rates ranging from 50-70 bpm.   2. HFrEF with last LVEF of 25-30% on recent hospitalization.  G2 DD, moderate mitral regurgitation.  Patient denies any shortness of breath.  She appears to be euvolemic on exam.  She is continued on Entresto 24/26 mg twice daily, Toprol-XL 50 mg daily.  She is not a good candidate for SGLT2 inhibitor due to sedentary lifestyle.  Continue to escalate GDMT as tolerated by blood pressure and kidney function.  Repeat echocardiogram ordered.   3. Primary hypertension with blood pressure 123/64.  Blood pressures remain stable.  She has been continued on her current medication regimen unchanged at this time.  Continue to monitor blood pressures at the facility at minimum of daily.   4. Preoperative cardiovascular exam point patient denies any chest pain, shortness of breath.  Has not had any bleeding noted with apixaban.  We did receive correspondence this morning that primary urologist does not require apixaban to  be on hold for procedure.  She requires no further cardiovascular testing prior to her procedure.  She is okay for procedure acceptable risk.  Would recommend limiting the amount of fluid that she is given during the procedure due to her low EF.       Ms. Legarda perioperative risk of a major cardiac event  is 0.9% according to the Revised Cardiac Risk Index (RCRI).  Therefore, she is at low risk for perioperative complications.   Her functional capacity is poor at 3.08 METs according to the Duke Activity Status Index (DASI). Recommendations: According to ACC/AHA guidelines, no further cardiovascular testing needed.  The patient may proceed to surgery at acceptable risk.    Reproductive/Obstetrics                             Anesthesia Physical Anesthesia Plan  ASA: 3  Anesthesia Plan: General   Post-op Pain Management:    Induction:   PONV Risk Score and Plan:   Airway Management Planned:   Additional Equipment:   Intra-op Plan:   Post-operative Plan:   Informed Consent:   Plan Discussed with:   Anesthesia Plan Comments: (Patient did not show on DOS.)        Anesthesia Quick Evaluation

## 2022-10-29 NOTE — Addendum Note (Signed)
Addended by: Letta Kocher A on: 10/29/2022 04:58 PM   Modules accepted: Orders

## 2022-10-30 ENCOUNTER — Encounter
Admission: RE | Admit: 2022-10-30 | Discharge: 2022-10-30 | Disposition: A | Discharge status: Home / Self Care | Payer: Medicare Other | Source: Ambulatory Visit | Attending: Urology | Admitting: Urology

## 2022-10-30 MED ORDER — FAMOTIDINE 20 MG PO TABS: 20.0000 mg | ORAL_TABLET | Freq: Once | ORAL | Status: DC

## 2022-10-30 MED ORDER — CEFAZOLIN SODIUM-DEXTROSE 2-4 GM/100ML-% IV SOLN: 2.0000 g | INTRAVENOUS | Status: DC

## 2022-10-30 MED ORDER — CHLORHEXIDINE GLUCONATE 0.12 % MT SOLN: 15.0000 mL | Freq: Once | OROMUCOSAL | Status: DC

## 2022-10-30 MED ORDER — ORAL CARE MOUTH RINSE: 15.0000 mL | Freq: Once | OROMUCOSAL | Status: DC

## 2022-10-30 MED ORDER — LACTATED RINGERS IV SOLN: INTRAVENOUS | Status: DC

## 2022-10-30 NOTE — Pre-Procedure Instructions (Signed)
Follow up PAT call completed with patients sister [place],Brenda , no changes in Hx, meds, or allergies. Sister has spoken with staff at [facility] and reviewed instructions.

## 2022-10-31 DIAGNOSIS — N200 Calculus of kidney: Secondary | ICD-10-CM

## 2022-11-11 ENCOUNTER — Telehealth: Payer: Self-pay | Admitting: Urology

## 2022-11-11 NOTE — Telephone Encounter (Signed)
Diane from [facility] called asking if pt's appt for stent removal on 10/10 could be moved up.

## 2022-11-12 ENCOUNTER — Encounter
Admission: RE | Admit: 2022-11-12 | Discharge: 2022-11-12 | Disposition: A | Discharge status: Home / Self Care | Payer: Medicare Other | Source: Ambulatory Visit | Attending: Urology | Admitting: Urology

## 2022-11-12 DIAGNOSIS — Z01812 Encounter for preprocedural laboratory examination: Secondary | ICD-10-CM

## 2022-11-12 DIAGNOSIS — E876 Hypokalemia: Secondary | ICD-10-CM

## 2022-11-12 DIAGNOSIS — I10 Essential (primary) hypertension: Secondary | ICD-10-CM

## 2022-11-12 DIAGNOSIS — N179 Acute kidney failure, unspecified: Secondary | ICD-10-CM

## 2022-11-12 MED ORDER — CIPROFLOXACIN HCL 250 MG PO TABS: 250.0000 mg | ORAL_TABLET | Freq: Two times a day (BID) | ORAL | Status: DC

## 2022-11-12 NOTE — Telephone Encounter (Signed)
-----   Message from [facility] sent at 11/11/2022  4:23 PM EDT ----- Regarding: Preop abx Please start Cipro 250mg  BID x7 days in advance of URS this week.

## 2022-11-12 NOTE — Telephone Encounter (Signed)
[facility] calling and left message on triage VM that she gave the wrong fax# it should be [phone] to fax prescription.

## 2022-11-12 NOTE — Telephone Encounter (Signed)
Refaxed orders to correct fax number

## 2022-11-12 NOTE — Pre-Procedure Instructions (Signed)
Spoke with sister [place]. She is planning on being present for Alison Price's upcoming surgery this Friday, September 27. Also spoke with Diane at [facility] to answer any questions. Surgery instructions faxed again to Diane at [facility].

## 2022-11-12 NOTE — Telephone Encounter (Signed)
Spoke with [facility] and have faxed over orders to start medication. Advised Stent removal will not be moved up due to surgery not being until Friday.

## 2022-11-13 ENCOUNTER — Ambulatory Visit: Payer: Medicare Other | Attending: Cardiology

## 2022-11-13 ENCOUNTER — Telehealth: Payer: Self-pay | Admitting: Cardiovascular Disease

## 2022-11-13 ENCOUNTER — Encounter: Payer: Medicare Other | Admitting: Urology

## 2022-11-13 MED ORDER — CEFAZOLIN SODIUM-DEXTROSE 2-4 GM/100ML-% IV SOLN
2.0000 g | Freq: Once | INTRAVENOUS | Status: AC
  Administered 2022-11-14: 2 g via INTRAVENOUS

## 2022-11-13 MED ORDER — LACTATED RINGERS IV SOLN: INTRAVENOUS | Status: DC

## 2022-11-13 MED ORDER — CHLORHEXIDINE GLUCONATE 0.12 % MT SOLN
15.0000 mL | Freq: Once | OROMUCOSAL | Status: AC
  Administered 2022-11-14: 15 mL via OROMUCOSAL

## 2022-11-13 MED ORDER — ORAL CARE MOUTH RINSE: 15.0000 mL | Freq: Once | OROMUCOSAL | Status: AC

## 2022-11-13 NOTE — Telephone Encounter (Signed)
Patient's sister is requesting call back to discuss upcoming appts. States she was unaware that these were scheduled and needs to know if they are necessary. Please advise.

## 2022-11-13 NOTE — Telephone Encounter (Signed)
Returned call to patient sister- advised that she was unsure of what all these appointments were scheduled for. She was scheduled for an ECHO LIMITED today, however when they arrived they were told it was not needed and they turned around and went home. Her sister is upset because she states she has to take off work, go get her and bring her to appointments. After review of chart it looks like this was a repeat echo to recheck heart function after being on medications, however I do not believe it was completed. Patient was cleared for surgery at last OV.  Advised I would check with NP on this to review if I missed anything.   Patient sister also unaware of appointment for December. Advised with her this was a 3 month follow up per last OV in September. She advised she is not sure if this is even needed and they will decide to keep the appointment closer to appointment time. Patient appreciated the assistance I attempted to give, however she is still not happy with the unknown for appointments and if things are really necessary.

## 2022-11-14 ENCOUNTER — Other Ambulatory Visit: Payer: Self-pay

## 2022-11-14 ENCOUNTER — Ambulatory Visit
Admission: RE | Admit: 2022-11-14 | Discharge: 2022-11-14 | Disposition: A | Discharge status: Skilled Nursing Facility | Payer: Medicare Other | Source: Ambulatory Visit | Attending: Urology | Admitting: Urology

## 2022-11-14 ENCOUNTER — Ambulatory Visit: Payer: Medicare Other | Admitting: Cardiology

## 2022-11-14 ENCOUNTER — Encounter
Admission: RE | Disposition: A | Discharge status: Skilled Nursing Facility | Payer: Self-pay | Source: Ambulatory Visit | Attending: Urology

## 2022-11-14 ENCOUNTER — Encounter: Payer: Self-pay | Admitting: Urology

## 2022-11-14 ENCOUNTER — Ambulatory Visit: Payer: Medicare Other | Admitting: Urgent Care

## 2022-11-14 ENCOUNTER — Telehealth: Payer: Self-pay

## 2022-11-14 ENCOUNTER — Ambulatory Visit: Payer: Medicare Other

## 2022-11-14 DIAGNOSIS — Z87442 Personal history of urinary calculi: Secondary | ICD-10-CM | POA: Insufficient documentation

## 2022-11-14 DIAGNOSIS — I420 Dilated cardiomyopathy: Secondary | ICD-10-CM | POA: Diagnosis not present

## 2022-11-14 DIAGNOSIS — F0393 Unspecified dementia, unspecified severity, with mood disturbance: Secondary | ICD-10-CM | POA: Diagnosis not present

## 2022-11-14 DIAGNOSIS — Z87891 Personal history of nicotine dependence: Secondary | ICD-10-CM | POA: Insufficient documentation

## 2022-11-14 DIAGNOSIS — N179 Acute kidney failure, unspecified: Secondary | ICD-10-CM

## 2022-11-14 DIAGNOSIS — I130 Hypertensive heart and chronic kidney disease with heart failure and stage 1 through stage 4 chronic kidney disease, or unspecified chronic kidney disease: Secondary | ICD-10-CM | POA: Diagnosis not present

## 2022-11-14 DIAGNOSIS — N200 Calculus of kidney: Secondary | ICD-10-CM

## 2022-11-14 DIAGNOSIS — N390 Urinary tract infection, site not specified: Secondary | ICD-10-CM

## 2022-11-14 DIAGNOSIS — I5022 Chronic systolic (congestive) heart failure: Secondary | ICD-10-CM | POA: Diagnosis not present

## 2022-11-14 DIAGNOSIS — I10 Essential (primary) hypertension: Secondary | ICD-10-CM

## 2022-11-14 DIAGNOSIS — N202 Calculus of kidney with calculus of ureter: Secondary | ICD-10-CM

## 2022-11-14 DIAGNOSIS — Z87440 Personal history of urinary (tract) infections: Secondary | ICD-10-CM | POA: Insufficient documentation

## 2022-11-14 DIAGNOSIS — Z01812 Encounter for preprocedural laboratory examination: Secondary | ICD-10-CM

## 2022-11-14 DIAGNOSIS — I480 Paroxysmal atrial fibrillation: Secondary | ICD-10-CM | POA: Diagnosis not present

## 2022-11-14 DIAGNOSIS — N183 Chronic kidney disease, stage 3 unspecified: Secondary | ICD-10-CM | POA: Diagnosis not present

## 2022-11-14 DIAGNOSIS — E876 Hypokalemia: Secondary | ICD-10-CM

## 2022-11-14 DIAGNOSIS — K219 Gastro-esophageal reflux disease without esophagitis: Secondary | ICD-10-CM | POA: Diagnosis not present

## 2022-11-14 DIAGNOSIS — G8929 Other chronic pain: Secondary | ICD-10-CM | POA: Diagnosis not present

## 2022-11-14 HISTORY — PX: CYSTOSCOPY/URETEROSCOPY/HOLMIUM LASER/STENT PLACEMENT: SHX6546

## 2022-11-14 LAB — POCT I-STAT, CHEM 8
BUN: 67 mg/dL — ABNORMAL HIGH (ref 8–23)
Calcium, Ion: 1.18 mmol/L (ref 1.15–1.40)
Chloride: 103 mmol/L (ref 98–111)
Creatinine, Ser: 1.7 mg/dL — ABNORMAL HIGH (ref 0.44–1.00)
Glucose, Bld: 90 mg/dL (ref 70–99)
HCT: 28 % — ABNORMAL LOW (ref 36.0–46.0)
Hemoglobin: 9.5 g/dL — ABNORMAL LOW (ref 12.0–15.0)
Potassium: 4.9 mmol/L (ref 3.5–5.1)
Sodium: 138 mmol/L (ref 135–145)
TCO2: 24 mmol/L (ref 22–32)

## 2022-11-14 SURGERY — CYSTOSCOPY/URETEROSCOPY/HOLMIUM LASER/STENT PLACEMENT
Anesthesia: General | Laterality: Left

## 2022-11-14 MED ORDER — SUGAMMADEX SODIUM 200 MG/2ML IV SOLN
INTRAVENOUS | Status: DC | PRN
  Administered 2022-11-14: 200 mg via INTRAVENOUS

## 2022-11-14 MED ORDER — ROCURONIUM BROMIDE 100 MG/10ML IV SOLN
INTRAVENOUS | Status: DC | PRN
  Administered 2022-11-14 (×2): 20 mg via INTRAVENOUS
  Administered 2022-11-14: 30 mg via INTRAVENOUS

## 2022-11-14 MED ORDER — EPHEDRINE SULFATE (PRESSORS) 50 MG/ML IJ SOLN
INTRAMUSCULAR | Status: DC | PRN
  Administered 2022-11-14: 5 mg via INTRAVENOUS

## 2022-11-14 MED ORDER — FAMOTIDINE 20 MG PO TABS
20.0000 mg | ORAL_TABLET | Freq: Once | ORAL | Status: AC
  Administered 2022-11-14: 20 mg via ORAL

## 2022-11-14 MED ORDER — ONDANSETRON HCL 4 MG/2ML IJ SOLN: 4.0000 mg | Freq: Once | INTRAMUSCULAR | Status: DC | PRN

## 2022-11-14 MED ORDER — CIPROFLOXACIN IN D5W 400 MG/200ML IV SOLN
INTRAVENOUS | Status: AC
  Filled 2022-11-14: qty 200

## 2022-11-14 MED ORDER — SUCCINYLCHOLINE CHLORIDE 200 MG/10ML IV SOSY
PREFILLED_SYRINGE | INTRAVENOUS | Status: DC | PRN
  Administered 2022-11-14: 80 mg via INTRAVENOUS

## 2022-11-14 MED ORDER — PROPOFOL 10 MG/ML IV BOLUS
INTRAVENOUS | Status: DC | PRN
  Administered 2022-11-14: 130 mg via INTRAVENOUS

## 2022-11-14 MED ORDER — ONDANSETRON HCL 4 MG/2ML IJ SOLN
INTRAMUSCULAR | Status: DC | PRN
  Administered 2022-11-14 (×2): 4 mg via INTRAVENOUS

## 2022-11-14 MED ORDER — LIDOCAINE HCL (CARDIAC) PF 100 MG/5ML IV SOSY
PREFILLED_SYRINGE | INTRAVENOUS | Status: DC | PRN
  Administered 2022-11-14: 100 mg via INTRAVENOUS

## 2022-11-14 MED ORDER — CHLORHEXIDINE GLUCONATE 0.12 % MT SOLN
OROMUCOSAL | Status: AC
  Filled 2022-11-14: qty 15

## 2022-11-14 MED ORDER — CIPROFLOXACIN HCL 250 MG PO TABS
250.0000 mg | ORAL_TABLET | Freq: Every day | ORAL | 0 refills | Status: DC
Start: 2022-11-14 — End: 2022-11-14
  Filled 2022-11-14: qty 14, 14d supply, fill #0

## 2022-11-14 MED ORDER — DEXAMETHASONE SODIUM PHOSPHATE 10 MG/ML IJ SOLN
INTRAMUSCULAR | Status: DC | PRN
  Administered 2022-11-14: 10 mg via INTRAVENOUS

## 2022-11-14 MED ORDER — SODIUM CHLORIDE 0.9 % IR SOLN
Status: DC | PRN
  Administered 2022-11-14: 1

## 2022-11-14 MED ORDER — FENTANYL CITRATE (PF) 100 MCG/2ML IJ SOLN: 25.0000 ug | INTRAMUSCULAR | Status: DC | PRN

## 2022-11-14 MED ORDER — GLYCOPYRROLATE 0.2 MG/ML IJ SOLN
INTRAMUSCULAR | Status: DC | PRN
  Administered 2022-11-14: .2 mg via INTRAVENOUS

## 2022-11-14 MED ORDER — IOHEXOL 180 MG/ML  SOLN
INTRAMUSCULAR | Status: DC | PRN
  Administered 2022-11-14: 5 mL

## 2022-11-14 MED ORDER — CIPROFLOXACIN HCL 250 MG PO TABS: 250.0000 mg | ORAL_TABLET | Freq: Every day | ORAL | 0 refills | Status: AC

## 2022-11-14 MED ORDER — PHENYLEPHRINE 80 MCG/ML (10ML) SYRINGE FOR IV PUSH (FOR BLOOD PRESSURE SUPPORT)
PREFILLED_SYRINGE | INTRAVENOUS | Status: DC | PRN
  Administered 2022-11-14: 160 ug via INTRAVENOUS

## 2022-11-14 MED ORDER — CEFAZOLIN SODIUM-DEXTROSE 2-4 GM/100ML-% IV SOLN
INTRAVENOUS | Status: AC
  Filled 2022-11-14: qty 100

## 2022-11-14 MED ORDER — FAMOTIDINE 20 MG PO TABS
ORAL_TABLET | ORAL | Status: AC
  Filled 2022-11-14: qty 1

## 2022-11-14 MED ORDER — ACETAMINOPHEN 10 MG/ML IV SOLN
INTRAVENOUS | Status: DC | PRN
  Administered 2022-11-14: 1000 mg via INTRAVENOUS

## 2022-11-14 SURGICAL SUPPLY — 25 items
ADH LQ OCL WTPRF AMP STRL LF (MISCELLANEOUS)
ADHESIVE MASTISOL STRL (MISCELLANEOUS) IMPLANT
BAG DRAIN SIEMENS DORNER NS (MISCELLANEOUS) ×1 IMPLANT
BAG DRN NS LF (MISCELLANEOUS) ×1
BAG PRESSURE INF REUSE 3000 (BAG) ×1 IMPLANT
BRUSH SCRUB EZ 4% CHG (MISCELLANEOUS) IMPLANT
DRAPE UTILITY 15X26 TOWEL STRL (DRAPES) ×1 IMPLANT
DRSG TEGADERM 2-3/8X2-3/4 SM (GAUZE/BANDAGES/DRESSINGS) IMPLANT
FIBER LASER MOSES 365 DFL (Laser) IMPLANT
GLOVE BIOGEL PI IND STRL 7.5 (GLOVE) ×1 IMPLANT
GOWN STRL REUS W/ TWL LRG LVL3 (GOWN DISPOSABLE) ×1 IMPLANT
GOWN STRL REUS W/ TWL XL LVL3 (GOWN DISPOSABLE) ×1 IMPLANT
GOWN STRL REUS W/TWL LRG LVL3 (GOWN DISPOSABLE) ×1
GOWN STRL REUS W/TWL XL LVL3 (GOWN DISPOSABLE) ×1
GUIDEWIRE STR DUAL SENSOR (WIRE) ×1 IMPLANT
IV NS IRRIG 3000ML ARTHROMATIC (IV SOLUTION) ×1 IMPLANT
KIT TURNOVER CYSTO (KITS) ×1 IMPLANT
PACK CYSTO AR (MISCELLANEOUS) ×1 IMPLANT
SET CYSTO W/LG BORE CLAMP LF (SET/KITS/TRAYS/PACK) ×1 IMPLANT
SHEATH NAVIGATOR HD 12/14X36 (SHEATH) IMPLANT
STENT URET 6FRX26 CONTOUR (STENTS) IMPLANT
SURGILUBE 2OZ TUBE FLIPTOP (MISCELLANEOUS) ×1 IMPLANT
SYR 10ML LL (SYRINGE) ×1 IMPLANT
VALVE UROSEAL ADJ ENDO (VALVE) IMPLANT
WATER STERILE IRR 500ML POUR (IV SOLUTION) ×1 IMPLANT

## 2022-11-14 NOTE — Discharge Instructions (Signed)

## 2022-11-14 NOTE — Telephone Encounter (Signed)
Received secure chat message from Dr. Richardo Hanks and OR nurse stating that pt's antibiotic had been sent to the wrong pharmacy [facility]) they request RX be faxed to the patient's live in facility. RX faxed to [facility] at [phone]. RX was faxed with no provider signature as there is no provider currently in office.
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
## 2022-11-14 NOTE — Anesthesia Postprocedure Evaluation (Signed)
Anesthesia Post Note  Patient: Alison Price  Procedure(s) Performed: CYSTOSCOPY/URETEROSCOPY/HOLMIUM LASER/STENT EXCHANGE (Left)  Patient location during evaluation: PACU Anesthesia Type: General Level of consciousness: awake Pain management: pain level controlled Vital Signs Assessment: post-procedure vital signs reviewed and stable Respiratory status: nonlabored ventilation Cardiovascular status: blood pressure returned to baseline Anesthetic complications: no   No notable events documented.   Last Vitals:  Vitals:   11/14/22 1343 11/14/22 1347  BP: (!) 160/71 (!) 142/69  Pulse: (!) 55 (!) 52  Resp: 10 17  Temp: 36.6 C   SpO2: 100% 100%    Last Pain:  Vitals:   11/14/22 1125  TempSrc: Temporal                 VAN STAVEREN,Helayna Dun
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
## 2022-11-14 NOTE — Progress Notes (Signed)
Postop prescription was sent to [facility]. Called [facility] and they stated it had to be faxed to them and sent to their pharmacy. Left message with Dr Richardo Hanks and talked with facility.

## 2022-11-14 NOTE — Telephone Encounter (Signed)
Her echo was scheduled as a limited study to reevaluate her heart function as she has HFrEF and has been on GDMT as titrated and allowed by blood pressure and kidney function. I am sorry that she was informed the study was not needed as that was not the case. It can be rescheduled for a later date if she is feeling well and is not short of breath, minimal swelling, and stable weights. If this is the case her follow up can be pushed out to 6 months to help accommodate the sisters work schedule.

## 2022-11-14 NOTE — Anesthesia Procedure Notes (Signed)
Procedure Name: Intubation Date/Time: 11/14/2022 12:33 PM  Performed by: Mohammed Kindle, CRNAPre-anesthesia Checklist: Patient identified, Emergency Drugs available, Suction available and Patient being monitored Patient Re-evaluated:Patient Re-evaluated prior to induction Oxygen Delivery Method: Circle system utilized Preoxygenation: Pre-oxygenation with 100% oxygen Induction Type: IV induction Ventilation: Mask ventilation without difficulty Laryngoscope Size: McGraph and 3 Grade View: Grade I Tube type: Oral Tube size: 6.5 mm Number of attempts: 1 Airway Equipment and Method: Stylet Placement Confirmation: ETT inserted through vocal cords under direct vision, positive ETCO2, breath sounds checked- equal and bilateral and CO2 detector Secured at: 21 cm Tube secured with: Tape Dental Injury: Teeth and Oropharynx as per pre-operative assessment

## 2022-11-14 NOTE — Transfer of Care (Signed)
Immediate Anesthesia Transfer of Care Note  Patient: Alison Price  Procedure(s) Performed: CYSTOSCOPY/URETEROSCOPY/HOLMIUM LASER/STENT EXCHANGE (Left)  Patient Location: PACU  Anesthesia Type:General  Level of Consciousness: awake, drowsy, and patient cooperative  Airway & Oxygen Therapy: Patient Spontanous Breathing and Patient connected to face mask oxygen  Post-op Assessment: Report given to RN and Post -op Vital signs reviewed and stable  Post vital signs: Reviewed and stable  Last Vitals:  Vitals Value Taken Time  BP 160/71 11/14/22 1341  Temp    Pulse 55 11/14/22 1343  Resp 16 11/14/22 1343  SpO2 100 % 11/14/22 1343  Vitals shown include unfiled device data.  Last Pain:  Vitals:   11/14/22 1125  TempSrc: Temporal         Complications: No notable events documented.

## 2022-11-14 NOTE — Op Note (Signed)
Date of procedure: 11/14/22  Preoperative diagnosis:  Left proximal ureteral stone Left renal stone  Postoperative diagnosis:  Same  Procedure: Cystoscopy, left retrograde pyelogram with intraoperative interpretation, left ureteral stent placement Left ureteroscopy and laser lithotripsy of proximal ureteral stone Left ureteroscopy and laser lithotripsy of left renal stone  Surgeon: Legrand Rams, MD  Anesthesia: General  Complications: None  Intraoperative findings:  Two massive left-sided stones dusted, very cloudy at conclusion of case but no definite evidence of fragments larger than the laser fiber Stent placed  EBL: None  Specimens: None  Drains: Left 6 French by 26 cm ureteral stent  Indication: Alison Price is a 77 y.o. patient with a number of comorbidities and large left UPJ stone and left renal stone previously underwent stent placement for sepsis from urinary source.  Follow-up has been very challenging with her dementia and coordinating with her facility.  She was not felt to be a candidate for PCNL and opted for ureteroscopy.  After reviewing the management options for treatment, they elected to proceed with the above surgical procedure(s). We have discussed the potential benefits and risks of the procedure, side effects of the proposed treatment, the likelihood of the patient achieving the goals of the procedure, and any potential problems that might occur during the procedure or recuperation. Informed consent has been obtained.  Description of procedure:  The patient was taken to the operating room and general anesthesia was induced. SCDs were placed for DVT prophylaxis. The patient was placed in the dorsal lithotomy position, prepped and draped in the usual sterile fashion, and preoperative antibiotics(Cipro) were administered. A preoperative time-out was performed.   A 21 French rigid cystoscope was used to intubate the urethra and thorough cystoscopy was  performed.  The bladder was grossly normal.  A sensor wire was advanced alongside the left ureteral stent up to the kidney under fluoroscopic vision.  The stent was grasped and pulled to the meatus and a second safety sensor wire was added.  A 12/14 French by 35 cm ureteral access sheath was advanced up to the proximal ureter.  Large stone could be seen in the UPJ on fluoroscopy as well as the renal pelvis.  A digital single-channel flexible ureteroscope was advanced through the sheath.  There was a large 1.5 cm stone at the UPJ and a 365 m laser fiber on settings of 1.0 J and 10 Hz was used to methodically dust the stone.  This was followed back up into the kidney and the residual fragments dusted.  The large 1 cm stone that had been seen in the lower pole on CT had migrated into the renal pelvis.  The 365 m laser fiber on settings of 0.5 J and 80 Hz was used to methodically dust the stone.  All calyces of the kidney were carefully inspected and appeared to contain primarily dust without any fragments larger than the laser fiber.  Vision was limited by significant amount of dust and stone fragments within the kidney.  A retrograde pyelogram was performed from the proximal ureter and showed no extravasation or filling defects.  Careful pullback ureteroscopy showed no evidence of ureteral injury or stone fragments within the ureter.  Disposition: Stable to PACU  Plan: Stent removal in clinic in 2 weeks  Legrand Rams, MD

## 2022-11-15 ENCOUNTER — Encounter: Payer: Self-pay | Admitting: Urology

## 2022-11-18 ENCOUNTER — Other Ambulatory Visit: Payer: Self-pay

## 2022-11-18 DIAGNOSIS — I5020 Unspecified systolic (congestive) heart failure: Secondary | ICD-10-CM

## 2022-11-18 NOTE — Telephone Encounter (Signed)
I called patient sister (okay per DPR) explained the situation. Advised I would reorder the limited echo, our scheduling team could reach out to get it completed before her visit in December. She states she has not seen her having any issues or concerns, and the facility has not notified her of any concerns either. Patient sister thankful for return call.   Limited echo reordered- will keep December appointment for now.   Thanks!

## 2022-11-27 ENCOUNTER — Encounter: Payer: Self-pay | Admitting: Oncology

## 2022-11-27 ENCOUNTER — Encounter: Payer: Medicare Other | Admitting: Urology

## 2022-12-04 ENCOUNTER — Encounter: Payer: Self-pay | Admitting: Oncology

## 2022-12-09 ENCOUNTER — Encounter: Payer: 59 | Admitting: Urology

## 2022-12-12 ENCOUNTER — Telehealth: Payer: Self-pay | Admitting: Cardiovascular Disease

## 2022-12-12 NOTE — Telephone Encounter (Signed)
Sister [name]) canceled patient's echocardiogram test and stated she wants a call back to discuss if this test is necessary.

## 2022-12-12 NOTE — Telephone Encounter (Signed)
I contacted sister, okay per DPR- advised that we were checking the limited echo to check heart function after being placed on medications .Marland Kitchen Patient sister verbalized understanding, thankful for call back. She will call to reschedule before appointment in December.

## 2022-12-15 ENCOUNTER — Ambulatory Visit: Payer: 59

## 2022-12-30 ENCOUNTER — Encounter: Payer: Self-pay | Admitting: Urology

## 2022-12-30 ENCOUNTER — Ambulatory Visit: Payer: 59 | Admitting: Urology

## 2022-12-30 VITALS — BP 102/60 | HR 58

## 2022-12-30 DIAGNOSIS — N200 Calculus of kidney: Secondary | ICD-10-CM

## 2022-12-30 DIAGNOSIS — Z466 Encounter for fitting and adjustment of urinary device: Secondary | ICD-10-CM | POA: Diagnosis not present

## 2022-12-30 MED ORDER — CIPROFLOXACIN HCL 250 MG PO TABS: 250.0000 mg | ORAL_TABLET | Freq: Two times a day (BID) | ORAL | 0 refills | Status: AC

## 2022-12-30 MED ORDER — CIPROFLOXACIN HCL 500 MG PO TABS
500.0000 mg | ORAL_TABLET | Freq: Once | ORAL | Status: AC
Start: 2022-12-30 — End: 2022-12-30
  Administered 2022-12-30: 500 mg via ORAL

## 2022-12-30 NOTE — Progress Notes (Signed)
Cystoscopy Procedure Note:  Indication: Stent removal s/p 11/14/2022  left ureteroscopy for significant left-sided stone burden  She was scheduled for 6-week stent removal, but has had numerous no-shows since that time  Cipro was given for prophylaxis today  After informed consent and discussion of the procedure and its risks, [name] was positioned and prepped in the standard fashion. Cystoscopy was performed with a flexible cystoscope. The stent was grasped with flexible graspers and removed in its entirety. The patient tolerated the procedure well.  Distal end of the stent was encrusted with soft stone.  Findings: Uncomplicated stent removal  Assessment and Plan: Cipro 250 mg twice daily x 7 days with long-term stent Return precautions discussed RTC 6 months KUB prior  Sondra Come, MD 12/30/2022

## 2022-12-30 NOTE — Addendum Note (Signed)
Addended by: Frankey Shown on: 12/30/2022 11:53 AM   Modules accepted: Orders

## 2023-01-12 ENCOUNTER — Ambulatory Visit: Payer: 59

## 2023-01-12 ENCOUNTER — Telehealth: Payer: Self-pay | Admitting: Cardiology

## 2023-01-12 NOTE — Telephone Encounter (Signed)
Patient has upcoming appointment next week and this can be addressed at that visit.

## 2023-01-12 NOTE — Telephone Encounter (Signed)
New Message"      Patient cx Echo for today(01-12-23). She will need a new order put in. It could not be rescheduled available date, because her order will have expired.

## 2023-01-21 ENCOUNTER — Ambulatory Visit: Payer: 59 | Admitting: Cardiology

## 2023-02-16 ENCOUNTER — Ambulatory Visit: Payer: 59 | Attending: Cardiology

## 2023-02-16 DIAGNOSIS — I5020 Unspecified systolic (congestive) heart failure: Secondary | ICD-10-CM

## 2023-02-16 LAB — ECHOCARDIOGRAM LIMITED
Area-P 1/2: 2.66 cm2
S' Lateral: 3.4 cm

## 2023-02-16 NOTE — Progress Notes (Signed)
Heart squeezes improved from 25-30% to 45-50% which is only mildly reduced function.  Right ventricular systolic function is now normal.  There is mild leakage in the mitral valve.  Overall reassuring study with improvement noted.

## 2023-02-27 ENCOUNTER — Ambulatory Visit: Payer: 59 | Admitting: Cardiology

## 2023-03-08 NOTE — Progress Notes (Unsigned)
Cardiology Clinic Note   Date: 03/10/2023 ID: Alison Price, DOB 1945/10/15, MRN [id number]  Primary Cardiologist:  Julien Nordmann, MD  Patient Profile    Alison Price is a 78 y.o. female who presents to the clinic today for routine follow up.     Past medical history significant for: Chronic HFrEF. Limited echo 02/16/2023: EF 45 to 50%.  Normal RV function.  Mild MR. PAF. Onset May 2024. Hypertension. Dementia. GERD. Nephrolithiasis. Frequent falls.  In summary, patient presented to [facility] ED on 06/21/2022 with altered mental status.  She was noted to be hypertensive and tachycardic.  She was admitted to ICU for severe sepsis from UTI, new onset A-fib, AMS, AKI, possible pneumonia.  Heart rate was in the 170s and she was started on amiodarone with conversion to NSR.  IV amiodarone was stopped on 06/22/2022.  CT showed obstructing 2 cm left renal stone with moderate hydronephrosis.  She underwent left stent placement 06/25/2022.  Postprocedure she was noted to have recurrent A-fib and converted to NSR with IV metoprolol.  Echo showed EF 25 to 30%, Grade II DD, mildly elevated PA pressure, RVSP 37 mmHg, small pericardial effusion without tamponade, moderate pleural effusion on the left lateral region, moderate MR, trivial AI.  She was discharged to inpatient rehab on 06/30/2022 on Eliquis, Entresto, metoprolol.     History of Present Illness    [facility] is followed by Dr. Mariah Milling for the above outlined history.  Patient was last seen in the office by Charlsie Quest, NP back on 10/22/2022 for hospital follow-up and preop evaluation.  She was doing well at that time.  She was maintaining sinus rhythm and was euvolemic.  She was found to be an acceptable risk for planned stent removal with urology.  She had a repeat echo in December 2024 which showed improved LV function with EF 45 to 50%.  Today, patient is accompanied by her sister who assists with information. Patient states  she is short of breath but cannot provide further information. Her sister reports she has not heard anything from the nursing home that this is an issue and "they are really good at letting me know everything." She is only being weighed monthly and weight is stable. BP and SPO2 have been normal. Patient works with physical therapy and is very active around the nursing home.     ROS: Limited secondary to patient with dementia.  EKGs/Labs Reviewed    EKG Interpretation Date/Time:  Tuesday March 10 2023 10:32:23 EST Ventricular Rate:  49 PR Interval:  194 QRS Duration:  88 QT Interval:  500 QTC Calculation: 451 R Axis:   -18  Text Interpretation: Sinus bradycardia Minimal voltage criteria for LVH, may be normal variant ( R in aVL ) Anterior infarct , age undetermined When compared with ECG of 22-Oct-2022 09:12, No significant changes Confirmed by Carlos Levering [phone]) on 03/10/2023 10:35:02 AM   06/21/2022: ALT 12; AST 46 11/14/2022: BUN 67; Creatinine, Ser 1.70; Potassium 4.9; Sodium 138   06/29/2022: WBC 6.6 11/14/2022: Hemoglobin 9.5   06/21/2022: TSH 1.571    Risk Assessment/Calculations     CHA2DS2-VASc Score = 5   This indicates a 7.2% annual risk of stroke. The patient's score is based upon: CHF History: 1 HTN History: 1 Diabetes History: 0 Stroke History: 0 Vascular Disease History: 0 Age Score: 2 Gender Score: 1             Physical Exam    VS:  BP 128/76 (BP Location: Left Arm, Patient Position: Sitting, Cuff Size: Normal)   Pulse (!) 49   Ht 5\' 10"  (1.778 m)   Wt 141 lb 2 oz (64 kg)   SpO2 93%   BMI 20.25 kg/m  , BMI Body mass index is 20.25 kg/m.  GEN: Well nourished, well developed, in no acute distress. Neck: No JVD or carotid bruits. Cardiac:  RRR. No murmurs. No rubs or gallops.   Respiratory:  Respirations regular and unlabored. Clear to auscultation without rales, wheezing or rhonchi. GI: Soft, nontender, nondistended. Extremities:  Radials/DP/PT 2+ and equal bilaterally. No clubbing or cyanosis. No edema.  Skin: Warm and dry, no rash. Neuro: Strength intact.  Assessment & Plan   Chronic HFrEF Limited echo December 2024 showed EF 45 to 50%, normal RV function, mild MR.  Patient reports shortness of breath but cannot provide more information. Patient's sister states that she has not been informed by the nursing home that is an issue.  Sister reports patient works with physical therapy and is very active around the nursing home. Patient is seated in a wheelchair with unlabored breathing. She is euvolemic and well compensated on exam. Patient is being weighed once a month. Recommend at least weekly weights.  -Continue Entresto, Toprol.  PAF Onset May 2024 in the setting of sepsis.  Patient denies palpitations. Patient's sister has not been informed of any bleeding concerns. EKG shows SB today.  -Continue amiodarone, Toprol and Eliquis. -CBC, CMP, TSH today.   Hypertension BP today 128/76.  -Continue Entresto, Toprol.  Disposition: CBC, CMP, TSH today. Return in 6 months or sooner as needed.          Signed, Etta Grandchild. Sheniya Garciaperez, DNP, NP-C

## 2023-03-10 ENCOUNTER — Encounter: Payer: Self-pay | Admitting: Student

## 2023-03-10 ENCOUNTER — Ambulatory Visit: Payer: 59 | Attending: Student | Admitting: Student

## 2023-03-10 VITALS — BP 128/76 | HR 49 | Ht 70.0 in | Wt 141.1 lb

## 2023-03-10 DIAGNOSIS — I5022 Chronic systolic (congestive) heart failure: Secondary | ICD-10-CM | POA: Diagnosis not present

## 2023-03-10 DIAGNOSIS — I10 Essential (primary) hypertension: Secondary | ICD-10-CM

## 2023-03-10 DIAGNOSIS — I480 Paroxysmal atrial fibrillation: Secondary | ICD-10-CM

## 2023-03-10 DIAGNOSIS — I420 Dilated cardiomyopathy: Secondary | ICD-10-CM

## 2023-03-10 NOTE — Patient Instructions (Addendum)
Medication Instructions:  Your physician recommends that you continue on your current medications as directed. Please refer to the Current Medication list given to you today.  *If you need a refill on your cardiac medications before your next appointment, please call your pharmacy*   Lab Work: Your provider recommends that you have labs drawn today: CMET, CBC, TSH  If you have labs (blood work) drawn today and your tests are completely normal, you will receive your results only by: MyChart Message (if you have MyChart) OR A paper copy in the mail If you have any lab test that is abnormal or we need to change your treatment, we will call you to review the results.   Follow-Up: At [facility], you and your health needs are our priority.  As part of our continuing mission to provide you with exceptional heart care, we have created designated Provider Care Teams.  These Care Teams include your primary Cardiologist (physician) and Advanced Practice Providers (APPs -  Physician Assistants and Nurse Practitioners) who all work together to provide you with the care you need, when you need it.  We recommend signing up for the patient portal called "MyChart".  Sign up information is provided on this After Visit Summary.  MyChart is used to connect with patients for Virtual Visits (Telemedicine).  Patients are able to view lab/test results, encounter notes, upcoming appointments, etc.  Non-urgent messages can be sent to your provider as well.   To learn more about what you can do with MyChart, go to [email or web address].    Your next appointment:   6 month(s)  Provider:   Julien Nordmann, MD or Carlos Levering, NP

## 2023-03-11 LAB — CBC WITH DIFFERENTIAL/PLATELET
Basophils Absolute: 0.1 10*3/uL (ref 0.0–0.2)
Basos: 2 %
EOS (ABSOLUTE): 0.1 10*3/uL (ref 0.0–0.4)
Eos: 2 %
Hematocrit: 29.8 % — ABNORMAL LOW (ref 34.0–46.6)
Hemoglobin: 9.7 g/dL — ABNORMAL LOW (ref 11.1–15.9)
Immature Grans (Abs): 0.1 10*3/uL (ref 0.0–0.1)
Immature Granulocytes: 3 %
Lymphocytes Absolute: 1.4 10*3/uL (ref 0.7–3.1)
Lymphs: 38 %
MCH: 31 pg (ref 26.6–33.0)
MCHC: 32.6 g/dL (ref 31.5–35.7)
MCV: 95 fL (ref 79–97)
Monocytes Absolute: 0.3 10*3/uL (ref 0.1–0.9)
Monocytes: 9 %
Neutrophils Absolute: 1.7 10*3/uL (ref 1.4–7.0)
Neutrophils: 46 %
Platelets: 129 10*3/uL — ABNORMAL LOW (ref 150–450)
RBC: 3.13 x10E6/uL — ABNORMAL LOW (ref 3.77–5.28)
RDW: 13.2 % (ref 11.7–15.4)
WBC: 3.6 10*3/uL (ref 3.4–10.8)

## 2023-03-11 LAB — TSH: TSH: 2.85 u[IU]/mL (ref 0.450–4.500)

## 2023-03-11 LAB — COMPREHENSIVE METABOLIC PANEL
ALT: 10 [IU]/L (ref 0–32)
AST: 25 [IU]/L (ref 0–40)
Albumin: 4.3 g/dL (ref 3.8–4.8)
Alkaline Phosphatase: 63 [IU]/L (ref 44–121)
BUN/Creatinine Ratio: 36 — ABNORMAL HIGH (ref 12–28)
BUN: 56 mg/dL — ABNORMAL HIGH (ref 8–27)
Bilirubin Total: 0.3 mg/dL (ref 0.0–1.2)
CO2: 23 mmol/L (ref 20–29)
Calcium: 10.2 mg/dL (ref 8.7–10.3)
Chloride: 102 mmol/L (ref 96–106)
Creatinine, Ser: 1.57 mg/dL — ABNORMAL HIGH (ref 0.57–1.00)
Globulin, Total: 2.9 g/dL (ref 1.5–4.5)
Glucose: 88 mg/dL (ref 70–99)
Potassium: 4.4 mmol/L (ref 3.5–5.2)
Sodium: 141 mmol/L (ref 134–144)
Total Protein: 7.2 g/dL (ref 6.0–8.5)
eGFR: 34 mL/min/{1.73_m2} — ABNORMAL LOW (ref >59)

## 2023-04-06 ENCOUNTER — Emergency Department: Payer: 59

## 2023-04-06 ENCOUNTER — Inpatient Hospital Stay
Admission: EM | Admit: 2023-04-06 | Discharge: 2023-04-09 | DRG: 690 | Disposition: A | Discharge status: Skilled Nursing Facility | Payer: 59 | Source: Skilled Nursing Facility | Attending: Internal Medicine | Admitting: Internal Medicine

## 2023-04-06 ENCOUNTER — Other Ambulatory Visit: Payer: Self-pay

## 2023-04-06 DIAGNOSIS — F32A Depression, unspecified: Secondary | ICD-10-CM | POA: Diagnosis present

## 2023-04-06 DIAGNOSIS — Z811 Family history of alcohol abuse and dependence: Secondary | ICD-10-CM

## 2023-04-06 DIAGNOSIS — J189 Pneumonia, unspecified organism: Secondary | ICD-10-CM | POA: Insufficient documentation

## 2023-04-06 DIAGNOSIS — K219 Gastro-esophageal reflux disease without esophagitis: Secondary | ICD-10-CM | POA: Diagnosis present

## 2023-04-06 DIAGNOSIS — F03918 Unspecified dementia, unspecified severity, with other behavioral disturbance: Secondary | ICD-10-CM | POA: Diagnosis present

## 2023-04-06 DIAGNOSIS — Z87891 Personal history of nicotine dependence: Secondary | ICD-10-CM

## 2023-04-06 DIAGNOSIS — I5022 Chronic systolic (congestive) heart failure: Secondary | ICD-10-CM | POA: Diagnosis present

## 2023-04-06 DIAGNOSIS — I10 Essential (primary) hypertension: Secondary | ICD-10-CM | POA: Diagnosis present

## 2023-04-06 DIAGNOSIS — Z9181 History of falling: Secondary | ICD-10-CM

## 2023-04-06 DIAGNOSIS — Z800 Family history of malignant neoplasm of digestive organs: Secondary | ICD-10-CM

## 2023-04-06 DIAGNOSIS — Z885 Allergy status to narcotic agent status: Secondary | ICD-10-CM

## 2023-04-06 DIAGNOSIS — F0390 Unspecified dementia without behavioral disturbance: Principal | ICD-10-CM

## 2023-04-06 DIAGNOSIS — Z66 Do not resuscitate: Secondary | ICD-10-CM | POA: Diagnosis present

## 2023-04-06 DIAGNOSIS — I959 Hypotension, unspecified: Secondary | ICD-10-CM | POA: Diagnosis present

## 2023-04-06 DIAGNOSIS — Z9183 Wandering in diseases classified elsewhere: Secondary | ICD-10-CM

## 2023-04-06 DIAGNOSIS — I420 Dilated cardiomyopathy: Secondary | ICD-10-CM | POA: Diagnosis present

## 2023-04-06 DIAGNOSIS — Z8049 Family history of malignant neoplasm of other genital organs: Secondary | ICD-10-CM

## 2023-04-06 DIAGNOSIS — W19XXXA Unspecified fall, initial encounter: Secondary | ICD-10-CM | POA: Diagnosis present

## 2023-04-06 DIAGNOSIS — Z993 Dependence on wheelchair: Secondary | ICD-10-CM

## 2023-04-06 DIAGNOSIS — Z833 Family history of diabetes mellitus: Secondary | ICD-10-CM

## 2023-04-06 DIAGNOSIS — Z90710 Acquired absence of both cervix and uterus: Secondary | ICD-10-CM

## 2023-04-06 DIAGNOSIS — I480 Paroxysmal atrial fibrillation: Secondary | ICD-10-CM | POA: Diagnosis present

## 2023-04-06 DIAGNOSIS — N390 Urinary tract infection, site not specified: Principal | ICD-10-CM | POA: Diagnosis present

## 2023-04-06 DIAGNOSIS — G9340 Encephalopathy, unspecified: Secondary | ICD-10-CM | POA: Diagnosis present

## 2023-04-06 DIAGNOSIS — Z515 Encounter for palliative care: Secondary | ICD-10-CM

## 2023-04-06 DIAGNOSIS — R296 Repeated falls: Secondary | ICD-10-CM | POA: Diagnosis present

## 2023-04-06 DIAGNOSIS — Z7901 Long term (current) use of anticoagulants: Secondary | ICD-10-CM

## 2023-04-06 DIAGNOSIS — I130 Hypertensive heart and chronic kidney disease with heart failure and stage 1 through stage 4 chronic kidney disease, or unspecified chronic kidney disease: Secondary | ICD-10-CM | POA: Diagnosis present

## 2023-04-06 DIAGNOSIS — Z7189 Other specified counseling: Secondary | ICD-10-CM

## 2023-04-06 DIAGNOSIS — Z6824 Body mass index (BMI) 24.0-24.9, adult: Secondary | ICD-10-CM

## 2023-04-06 DIAGNOSIS — G9349 Other encephalopathy: Secondary | ICD-10-CM | POA: Diagnosis present

## 2023-04-06 DIAGNOSIS — R64 Cachexia: Secondary | ICD-10-CM | POA: Diagnosis present

## 2023-04-06 DIAGNOSIS — Z882 Allergy status to sulfonamides status: Secondary | ICD-10-CM

## 2023-04-06 DIAGNOSIS — Z79899 Other long term (current) drug therapy: Secondary | ICD-10-CM

## 2023-04-06 DIAGNOSIS — R636 Underweight: Secondary | ICD-10-CM | POA: Diagnosis present

## 2023-04-06 DIAGNOSIS — N183 Chronic kidney disease, stage 3 unspecified: Secondary | ICD-10-CM | POA: Diagnosis present

## 2023-04-06 LAB — CBC
HCT: 30.3 % — ABNORMAL LOW (ref 36.0–46.0)
Hemoglobin: 10.1 g/dL — ABNORMAL LOW (ref 12.0–15.0)
MCH: 32 pg (ref 26.0–34.0)
MCHC: 33.3 g/dL (ref 30.0–36.0)
MCV: 95.9 fL (ref 80.0–100.0)
Platelets: 159 10*3/uL (ref 150–400)
RBC: 3.16 MIL/uL — ABNORMAL LOW (ref 3.87–5.11)
RDW: 13.7 % (ref 11.5–15.5)
WBC: 5.4 10*3/uL (ref 4.0–10.5)
nRBC: 0 % (ref 0.0–0.2)

## 2023-04-06 LAB — COMPREHENSIVE METABOLIC PANEL WITH GFR
ALT: 13 U/L (ref 0–44)
AST: 25 U/L (ref 15–41)
Albumin: 3.9 g/dL (ref 3.5–5.0)
Alkaline Phosphatase: 58 U/L (ref 38–126)
Anion gap: 14 (ref 5–15)
BUN: 51 mg/dL — ABNORMAL HIGH (ref 8–23)
CO2: 24 mmol/L (ref 22–32)
Calcium: 10.1 mg/dL (ref 8.9–10.3)
Chloride: 99 mmol/L (ref 98–111)
Creatinine, Ser: 1.45 mg/dL — ABNORMAL HIGH (ref 0.44–1.00)
GFR, Estimated: 37 mL/min — ABNORMAL LOW
Glucose, Bld: 96 mg/dL (ref 70–99)
Potassium: 4.4 mmol/L (ref 3.5–5.1)
Sodium: 137 mmol/L (ref 135–145)
Total Bilirubin: 0.6 mg/dL (ref 0.0–1.2)
Total Protein: 7.6 g/dL (ref 6.5–8.1)

## 2023-04-06 NOTE — ED Triage Notes (Addendum)
 Pt comes via [facility] from [facility] with increased AMS more than usual. Pt was dx with pneumonia and started on meds today.  90/50 54 96% Ra CBG 169  Pt is a wandering and does have dementia. Pt denies any recent falls or pain. Pt answering all questions appropriately.

## 2023-04-06 NOTE — ED Provider Triage Note (Signed)
 Emergency Medicine Provider Triage Evaluation Note  Alison Price , a 78 y.o. female  was evaluated in triage.  Pt presents via EMS for increased altered mental status. She was diagnosed with pneumonia today and given IM Rocephin. Patient currently denies complaints.  Physical Exam  BP 97/60   Pulse (!) 53   Temp 97.7 F (36.5 C)   Resp 18   Ht 5\' 5"  (1.651 m)   Wt 68 kg   SpO2 95%   BMI 24.96 kg/m  Gen:   Awake, no distress   Resp:  Normal effort  MSK:   Moves extremities without difficulty  Other:    Medical Decision Making  Medically screening exam initiated at 6:47 PM.  Appropriate orders placed.  Alison Price was informed that the remainder of the evaluation will be completed by another provider, this initial triage assessment does not replace that evaluation, and the importance of remaining in the ED until their evaluation is complete.     Chinita Pester, FNP 04/06/23 [phone]

## 2023-04-07 ENCOUNTER — Emergency Department: Payer: 59

## 2023-04-07 DIAGNOSIS — Z811 Family history of alcohol abuse and dependence: Secondary | ICD-10-CM | POA: Diagnosis not present

## 2023-04-07 DIAGNOSIS — Z515 Encounter for palliative care: Secondary | ICD-10-CM

## 2023-04-07 DIAGNOSIS — I480 Paroxysmal atrial fibrillation: Secondary | ICD-10-CM | POA: Diagnosis present

## 2023-04-07 DIAGNOSIS — F0390 Unspecified dementia without behavioral disturbance: Secondary | ICD-10-CM

## 2023-04-07 DIAGNOSIS — F32A Depression, unspecified: Secondary | ICD-10-CM | POA: Diagnosis present

## 2023-04-07 DIAGNOSIS — Z833 Family history of diabetes mellitus: Secondary | ICD-10-CM | POA: Diagnosis not present

## 2023-04-07 DIAGNOSIS — Z8049 Family history of malignant neoplasm of other genital organs: Secondary | ICD-10-CM | POA: Diagnosis not present

## 2023-04-07 DIAGNOSIS — Z66 Do not resuscitate: Secondary | ICD-10-CM | POA: Diagnosis present

## 2023-04-07 DIAGNOSIS — W19XXXA Unspecified fall, initial encounter: Secondary | ICD-10-CM | POA: Diagnosis not present

## 2023-04-07 DIAGNOSIS — Z7189 Other specified counseling: Secondary | ICD-10-CM | POA: Diagnosis not present

## 2023-04-07 DIAGNOSIS — I959 Hypotension, unspecified: Secondary | ICD-10-CM | POA: Diagnosis present

## 2023-04-07 DIAGNOSIS — N183 Chronic kidney disease, stage 3 unspecified: Secondary | ICD-10-CM | POA: Diagnosis present

## 2023-04-07 DIAGNOSIS — G9340 Encephalopathy, unspecified: Secondary | ICD-10-CM

## 2023-04-07 DIAGNOSIS — Z9183 Wandering in diseases classified elsewhere: Secondary | ICD-10-CM | POA: Diagnosis not present

## 2023-04-07 DIAGNOSIS — N390 Urinary tract infection, site not specified: Principal | ICD-10-CM

## 2023-04-07 DIAGNOSIS — I420 Dilated cardiomyopathy: Secondary | ICD-10-CM | POA: Diagnosis present

## 2023-04-07 DIAGNOSIS — J189 Pneumonia, unspecified organism: Secondary | ICD-10-CM | POA: Insufficient documentation

## 2023-04-07 DIAGNOSIS — K219 Gastro-esophageal reflux disease without esophagitis: Secondary | ICD-10-CM | POA: Diagnosis present

## 2023-04-07 DIAGNOSIS — I10 Essential (primary) hypertension: Secondary | ICD-10-CM | POA: Diagnosis not present

## 2023-04-07 DIAGNOSIS — Z87891 Personal history of nicotine dependence: Secondary | ICD-10-CM | POA: Diagnosis not present

## 2023-04-07 DIAGNOSIS — W19XXXS Unspecified fall, sequela: Secondary | ICD-10-CM | POA: Diagnosis not present

## 2023-04-07 DIAGNOSIS — I130 Hypertensive heart and chronic kidney disease with heart failure and stage 1 through stage 4 chronic kidney disease, or unspecified chronic kidney disease: Secondary | ICD-10-CM | POA: Diagnosis present

## 2023-04-07 DIAGNOSIS — Z7901 Long term (current) use of anticoagulants: Secondary | ICD-10-CM | POA: Diagnosis not present

## 2023-04-07 DIAGNOSIS — R64 Cachexia: Secondary | ICD-10-CM | POA: Diagnosis present

## 2023-04-07 DIAGNOSIS — G9349 Other encephalopathy: Secondary | ICD-10-CM | POA: Diagnosis present

## 2023-04-07 DIAGNOSIS — I5022 Chronic systolic (congestive) heart failure: Secondary | ICD-10-CM | POA: Diagnosis present

## 2023-04-07 DIAGNOSIS — R296 Repeated falls: Secondary | ICD-10-CM | POA: Diagnosis present

## 2023-04-07 DIAGNOSIS — F03918 Unspecified dementia, unspecified severity, with other behavioral disturbance: Secondary | ICD-10-CM | POA: Diagnosis present

## 2023-04-07 DIAGNOSIS — Z800 Family history of malignant neoplasm of digestive organs: Secondary | ICD-10-CM | POA: Diagnosis not present

## 2023-04-07 DIAGNOSIS — Z90710 Acquired absence of both cervix and uterus: Secondary | ICD-10-CM | POA: Diagnosis not present

## 2023-04-07 DIAGNOSIS — Z885 Allergy status to narcotic agent status: Secondary | ICD-10-CM | POA: Diagnosis not present

## 2023-04-07 LAB — URINALYSIS, ROUTINE W REFLEX MICROSCOPIC
Bilirubin Urine: NEGATIVE
Glucose, UA: NEGATIVE mg/dL
Ketones, ur: NEGATIVE mg/dL
Nitrite: NEGATIVE
Protein, ur: 30 mg/dL — AB
RBC / HPF: >50 RBC/hpf (ref 0–5)
Specific Gravity, Urine: 1.016 (ref 1.005–1.030)
pH: 6 (ref 5.0–8.0)

## 2023-04-07 LAB — LACTIC ACID, PLASMA: Lactic Acid, Venous: 0.9 mmol/L (ref 0.5–1.9)

## 2023-04-07 MED ORDER — ENOXAPARIN SODIUM 30 MG/0.3ML IJ SOSY
30.0000 mg | PREFILLED_SYRINGE | INTRAMUSCULAR | Status: DC
  Administered 2023-04-08: 30 mg via SUBCUTANEOUS
  Filled 2023-04-07: qty 0.3

## 2023-04-07 MED ORDER — HALOPERIDOL LACTATE 5 MG/ML IJ SOLN
2.0000 mg | Freq: Four times a day (QID) | INTRAMUSCULAR | Status: DC | PRN
  Administered 2023-04-07 – 2023-04-08 (×3): 2 mg via INTRAVENOUS
  Filled 2023-04-07 (×4): qty 1

## 2023-04-07 MED ORDER — SODIUM CHLORIDE 0.9 % IV SOLN
500.0000 mg | INTRAVENOUS | Status: DC
  Administered 2023-04-08: 500 mg via INTRAVENOUS
  Filled 2023-04-07 (×2): qty 5

## 2023-04-07 MED ORDER — ONDANSETRON HCL 4 MG/2ML IJ SOLN: 4.0000 mg | Freq: Four times a day (QID) | INTRAMUSCULAR | Status: DC | PRN

## 2023-04-07 MED ORDER — LACTATED RINGERS IV BOLUS
1000.0000 mL | Freq: Once | INTRAVENOUS | Status: AC
  Administered 2023-04-07: 1000 mL via INTRAVENOUS

## 2023-04-07 MED ORDER — SODIUM CHLORIDE 0.9 % IV SOLN
1.0000 g | INTRAVENOUS | Status: AC
  Administered 2023-04-07: 1 g via INTRAVENOUS
  Filled 2023-04-07: qty 10

## 2023-04-07 MED ORDER — SODIUM CHLORIDE 0.9 % IV SOLN
1.0000 g | INTRAVENOUS | Status: DC
  Administered 2023-04-08: 1 g via INTRAVENOUS
  Filled 2023-04-07: qty 10

## 2023-04-07 MED ORDER — SODIUM CHLORIDE 0.9 % IV SOLN: INTRAVENOUS | Status: DC

## 2023-04-07 MED ORDER — ONDANSETRON HCL 4 MG PO TABS: 4.0000 mg | ORAL_TABLET | Freq: Four times a day (QID) | ORAL | Status: DC | PRN

## 2023-04-07 NOTE — ED Notes (Signed)
 Writer requested to assist in pt care. Pt found in bed soiled of urine, all clothing removed and IV displaced. Writer and EDT removed all soiled linen, cleaned and dried pt, applied new brief and gown to pt. All linen replaced and IV removal site cleaned and bandage applied. Bedside sitter in place with pt and all fall prevention interventions checked to ensure in place and initiated. Busy blanket given to pt for distraction.

## 2023-04-07 NOTE — Sepsis Progress Note (Signed)
 Message to RN to get lactic acid and blood cultures drawn and to administer antibiotics

## 2023-04-07 NOTE — ED Provider Notes (Signed)
 [facility] Provider Note    Event Date/Time   First MD Initiated Contact with Patient 04/07/23 0001     (approximate)   History   Chief Complaint Altered Mental Status and Hypotension   HPI  Alison Price is a 78 y.o. female with past medical history of hypertension, anemia, atrial fibrillation on Eliquis, GERD, and dementia who presents to the ED for altered mental status.  Per EMS, staff at her nursing facility was concerned that patient was more confused than usual and so she was sent to the ED for evaluation.  At the time of my assessment, patient is not sure why she is here and denies any complaints.  She was reportedly diagnosed with pneumonia earlier today and given a dose of IM Rocephin.  She currently denies any cough or difficulty breathing, is not aware of any fevers.     Physical Exam   Triage Vital Signs: ED Triage Vitals  Encounter Vitals Group     BP 04/06/23 1841 97/60     Systolic BP Percentile --      Diastolic BP Percentile --      Pulse Rate 04/06/23 1841 (!) 53     Resp 04/06/23 1841 18     Temp 04/06/23 1841 97.7 F (36.5 C)     Temp Source 04/06/23 2217 Oral     SpO2 04/06/23 1841 95 %     Weight 04/06/23 1840 150 lb (68 kg)     Height 04/06/23 1840 5\' 5"  (1.651 m)     Head Circumference --      Peak Flow --      Pain Score 04/06/23 1840 0     Pain Loc --      Pain Education --      Exclude from Growth Chart --     Most recent vital signs: Vitals:   04/07/23 0400 04/07/23 0430  BP: 117/66 (!) 114/52  Pulse:  (!) 50  Resp:    Temp:    SpO2:  99%    Constitutional: Alert and oriented to person, but not place, time, or situation. Eyes: Conjunctivae are normal. Head: Atraumatic. Nose: No congestion/rhinnorhea. Mouth/Throat: Mucous membranes are moist.  Cardiovascular: Normal rate, regular rhythm. Grossly normal heart sounds.  2+ radial pulses bilaterally. Respiratory: Normal respiratory effort.  No  retractions. Lungs CTAB. Gastrointestinal: Soft and nontender. No distention. Musculoskeletal: No lower extremity tenderness nor edema.  Neurologic:  Normal speech and language. No gross focal neurologic deficits are appreciated.    ED Results / Procedures / Treatments   Labs (all labs ordered are listed, but only abnormal results are displayed) Labs Reviewed  COMPREHENSIVE METABOLIC PANEL - Abnormal; Notable for the following components:      Result Value   BUN 51 (*)    Creatinine, Ser 1.45 (*)    GFR, Estimated 37 (*)    All other components within normal limits  CBC - Abnormal; Notable for the following components:   RBC 3.16 (*)    Hemoglobin 10.1 (*)    HCT 30.3 (*)    All other components within normal limits  URINALYSIS, ROUTINE W REFLEX MICROSCOPIC  CBG MONITORING, ED     EKG  ED ECG REPORT I, Chesley Noon, the attending physician, personally viewed and interpreted this ECG.   Date: 04/07/2023  EKG Time: 18:48  Rate: 55  Rhythm: sinus bradycardia  Axis: Normal  Intervals:none  ST&T Change: None  RADIOLOGY CT head reviewed and interpreted  by me with no hemorrhage or midline shift.  PROCEDURES:  Critical Care performed: No  Procedures   MEDICATIONS ORDERED IN ED: Medications - No data to display   IMPRESSION / MDM / ASSESSMENT AND PLAN / ED COURSE  I reviewed the triage vital signs and the nursing notes.                              78 y.o. female with past medical history of hypertension, anemia, atrial fibrillation on Eliquis, GERD, and dementia who presents to the ED due to concern for altered mental status by staff at her nursing facility.  Patient's presentation is most consistent with acute presentation with potential threat to life or bodily function.  Differential diagnosis includes, but is not limited to, stroke, TIA, intracranial hemorrhage, pneumonia, sepsis, UTI, anemia, electrolyte abnormality, AKI, dementia.  Patient  nontoxic-appearing and in no acute distress, vital signs remarkable for mild bradycardia but otherwise reassuring.  She is oriented to self only, which seems similar to prior notes, with no focal neurologic deficits on exam.  CT head is negative for acute process and labs are reassuring with no significant anemia, leukocytosis, or electrolyte abnormality.  Renal function seems similar compared to previous and LFTs are unremarkable.  She does have a history of recurrent UTI, will perform In-N-Out catheterization to obtain sample.  Chest x-ray with likely atelectasis, also with some lucency beneath the right hemidiaphragm.  Patient denies any abdominal pain and has a benign abdominal exam without any tenderness whatsoever, do not feel additional imaging indicated at this time as this likely represents high riding bowel as seen on prior imaging.   ----------------------------------------- 2:17 AM on 04/07/2023 ----------------------------------------- Patient had a fall here in the ED where staff heard a thud and found patient laying on her backside against the wall.  It is unclear whether she hit her head, but she did not appear to lose consciousness, does have midline cervical spine tenderness.  We will check CT head and cervical spine, no evidence of traumatic injury to her trunk or extremities.  CT head and cervical spine are negative for acute traumatic injury, patient continues to deny any complaints on reassessment.  Nursing staff was unsuccessful in obtaining urine sample via In-N-Out catheterization, patient subsequently given something to drink but then urinated in her brief.  Patient turned over to oncoming divider pending urinalysis, anticipate discharge back to her nursing facility.      FINAL CLINICAL IMPRESSION(S) / ED DIAGNOSES   Final diagnoses:  Dementia without behavioral disturbance, psychotic disturbance, mood disturbance, or anxiety, unspecified dementia severity, unspecified  dementia type (HCC)  Fall, initial encounter     Rx / DC Orders   ED Discharge Orders     None        Note:  This document was prepared using Dragon voice recognition software and may include unintentional dictation errors.   Chesley Noon, MD 04/07/23 [phone]

## 2023-04-07 NOTE — Sepsis Progress Note (Signed)
Notified provider of need to order blood cultures.  
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
## 2023-04-07 NOTE — ED Notes (Signed)
 ED provider, Larinda Buttery heard pt fall and then seen pt lying on floor next to her bed and requested assist by staff. Writer, Rayfield Citizen RN and Walker RN assisted pt up and back into bed. Both side rail were up at time of fall per provider. Pt taken to CT for head and cervical spine imaging due to tenderness on palpitation by EDP. No obvious assessment of injury to skin or scalp. Pt able to move all extremities without difficulty when assisted by staff. Fall bracelet applied, fall alarm initiated, bed at lowest position, fall magnet on outside of door, door remaining open, non-slip socks applied, and call bell within reach with education provided. Busy blanket provided for distraction due to poor safety awareness and desire to remove monitoring devices.

## 2023-04-07 NOTE — Assessment & Plan Note (Signed)
 Recurrent falls in setting of encephalopathy, end stage dementia, uti Baseline increased fall risk  Fall precautions  Monitor

## 2023-04-07 NOTE — ED Provider Notes (Signed)
 Vitals:   04/07/23 0730 04/07/23 0800  BP: (!) 85/59 105/65  Pulse: (!) 49 (!) 57  Resp: 18 16  Temp:  98.6 F (37 C)  SpO2: 99% 100%     Patient noted to have hypotension earlier.  Blood pressure currently 100 systolic.  She is definitely confused not oriented to place I also called and spoke with her Alison Price.  She advises that the patient has such severe dementia that she does not even recognize her own sister, though there has been concerns that she had increased confusion last couple days and that she has a history of similar with "urinary tract infection."     Sharyn Creamer, MD 04/07/23 1221

## 2023-04-07 NOTE — Assessment & Plan Note (Addendum)
 Encephalopathy Acute decompensated encephalopathy in setting of UTI Urinalysis indicative of infection Also clinically dry IV fluid hydration IV Rocephin As needed Haldol for agitation Monitor

## 2023-04-07 NOTE — Discharge Instructions (Addendum)
 Pt to be followed by [facility] Holding metoprolol since HR was 47-54. HR on day of d/c is 70's. Resume metoprolol if HR remains >70

## 2023-04-07 NOTE — Sepsis Progress Note (Signed)
 Notified provider and bedside nurse of need to order and administer fluid bolus pt needs 2040 cc for 2 MAP<65.

## 2023-04-07 NOTE — ED Notes (Addendum)
 Pt's brief soiled w/ urine. Pt cleaned and changed into new brief at this time.   Float rn

## 2023-04-07 NOTE — Sepsis Progress Note (Signed)
 Elink monitoring for the code sepsis protocol.

## 2023-04-07 NOTE — Progress Notes (Signed)
 PHARMACIST - PHYSICIAN COMMUNICATION  CONCERNING:  Enoxaparin (Lovenox) for DVT Prophylaxis   ASSESSMENT: Patient was prescribed enoxaparin 40 mg subcutaneously every 24 hours for VTE prophylaxis.   Body mass index is 24.96 kg/m.  Estimated Creatinine Clearance: 29.2 mL/min (A) (by C-G formula based on SCr of 1.45 mg/dL (H)).  Based on [facility] policy, patient qualifies for enoxaparin dosing of 30 mg every 24 hours because their creatinine clearance is <30 mL/min.  PLAN: Pharmacy has adjusted enoxaparin dose per [facility] policy.  Description: Patient is now receiving enoxaparin 30 mg subcutaneously every 24 hours.  Will M. Dareen Piano, PharmD Clinical Pharmacist 04/07/2023 3:41 PM

## 2023-04-07 NOTE — ED Notes (Signed)
 Pt wheeled to rm via wc, pt assisted to bed, bp cuff and pulse ox placed at this time, call light within reach, and pt given two warm blankets. RN made aware of pt being in rm.

## 2023-04-07 NOTE — ED Provider Notes (Signed)
 Consulted with anticipate admission to hospitalist service under the care of Dr. Alvester Morin.  Awaiting first lactic acid blood cultures ordered.  IV fluids and Rocephin.  Suspect urinary tract infection causing increase confusion for typical baseline.  Also reportedly had 1 fall during the evening evaluated by Dr. Larinda Buttery while in the ER.  Given the patient's frailty level of confusion, history and suspicion for urinary tract infection with at least some mild hypotension I have ordered antibiotics fluids and anticipate admission   Sharyn Creamer, MD 04/07/23 1021

## 2023-04-07 NOTE — Assessment & Plan Note (Signed)
 Stable Hold eliquis for now in setting of recurring falls.
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
## 2023-04-07 NOTE — Consult Note (Signed)
 CODE SEPSIS - PHARMACY COMMUNICATION  **Broad Spectrum Antibiotics should be administered within 1 hour of Sepsis diagnosis**  Time Code Sepsis Called/Page Received: 0981  Antibiotics Ordered: ceftriaxone  Time of 1st antibiotic administration: 1114  Additional action taken by pharmacy: None     Ronnald Ramp ,PharmD Clinical Pharmacist  04/07/2023  11:43 AM

## 2023-04-07 NOTE — Assessment & Plan Note (Signed)
 Noted recent diagnosis of pna and SNF  No hypoxia at present w/ hazy atelectasis in RLL  On rocephin in setting of UTI  Will add on azithromycin while in house  Monitor

## 2023-04-07 NOTE — ED Notes (Signed)
 This EDT found pt to be completely naked when walking by room. Pt bed soaked with urine and pt had pulled IV out and taken pulse ox and BP cuff off. This EDT called pts RN for assistance with cleaning up pt and to inform that pt had pulled IV out. EDT could not reach RN so charge RN, Erie Noe, was called to rm. Pt was cleaned of urine, bed linens changed and pt placed in new gown and brief. This tech is now sitting 1:1 per charge for the time being.

## 2023-04-07 NOTE — Assessment & Plan Note (Signed)
 BP stable Titrate home regimen

## 2023-04-08 ENCOUNTER — Encounter: Payer: Self-pay | Admitting: Family Medicine

## 2023-04-08 DIAGNOSIS — W19XXXA Unspecified fall, initial encounter: Secondary | ICD-10-CM | POA: Diagnosis not present

## 2023-04-08 DIAGNOSIS — I10 Essential (primary) hypertension: Secondary | ICD-10-CM

## 2023-04-08 DIAGNOSIS — F0390 Unspecified dementia without behavioral disturbance: Secondary | ICD-10-CM | POA: Diagnosis not present

## 2023-04-08 DIAGNOSIS — N390 Urinary tract infection, site not specified: Secondary | ICD-10-CM | POA: Diagnosis not present

## 2023-04-08 LAB — CBC
HCT: 26.6 % — ABNORMAL LOW (ref 36.0–46.0)
Hemoglobin: 9 g/dL — ABNORMAL LOW (ref 12.0–15.0)
MCH: 32.4 pg (ref 26.0–34.0)
MCHC: 33.8 g/dL (ref 30.0–36.0)
MCV: 95.7 fL (ref 80.0–100.0)
Platelets: 110 10*3/uL — ABNORMAL LOW (ref 150–400)
RBC: 2.78 MIL/uL — ABNORMAL LOW (ref 3.87–5.11)
RDW: 13.5 % (ref 11.5–15.5)
WBC: 3.3 10*3/uL — ABNORMAL LOW (ref 4.0–10.5)
nRBC: 0 % (ref 0.0–0.2)

## 2023-04-08 LAB — URINE CULTURE

## 2023-04-08 LAB — COMPREHENSIVE METABOLIC PANEL
ALT: 12 U/L (ref 0–44)
AST: 22 U/L (ref 15–41)
Albumin: 3.2 g/dL — ABNORMAL LOW (ref 3.5–5.0)
Alkaline Phosphatase: 51 U/L (ref 38–126)
Anion gap: 9 (ref 5–15)
BUN: 36 mg/dL — ABNORMAL HIGH (ref 8–23)
CO2: 22 mmol/L (ref 22–32)
Calcium: 8.8 mg/dL — ABNORMAL LOW (ref 8.9–10.3)
Chloride: 107 mmol/L (ref 98–111)
Creatinine, Ser: 1.29 mg/dL — ABNORMAL HIGH (ref 0.44–1.00)
GFR, Estimated: 43 mL/min — ABNORMAL LOW (ref >60)
Glucose, Bld: 81 mg/dL (ref 70–99)
Potassium: 3.7 mmol/L (ref 3.5–5.1)
Sodium: 138 mmol/L (ref 135–145)
Total Bilirubin: 0.5 mg/dL (ref 0.0–1.2)
Total Protein: 6.6 g/dL (ref 6.5–8.1)

## 2023-04-08 MED ORDER — SODIUM CHLORIDE 0.9 % IV SOLN: Freq: Once | INTRAVENOUS | Status: DC

## 2023-04-08 MED ORDER — OLANZAPINE 5 MG PO TABS
2.5000 mg | ORAL_TABLET | Freq: Every day | ORAL | Status: DC
  Filled 2023-04-08: qty 1

## 2023-04-08 MED ORDER — ENOXAPARIN SODIUM 30 MG/0.3ML IJ SOSY: 30.0000 mg | PREFILLED_SYRINGE | INTRAMUSCULAR | Status: DC

## 2023-04-08 MED ORDER — ENOXAPARIN SODIUM 40 MG/0.4ML IJ SOSY
40.0000 mg | PREFILLED_SYRINGE | INTRAMUSCULAR | Status: DC
  Administered 2023-04-08: 40 mg via SUBCUTANEOUS
  Filled 2023-04-08: qty 0.4

## 2023-04-08 MED ORDER — LEVOFLOXACIN 750 MG PO TABS
750.0000 mg | ORAL_TABLET | ORAL | Status: DC
  Filled 2023-04-08: qty 1

## 2023-04-08 MED ORDER — APIXABAN 5 MG PO TABS: 5.0000 mg | ORAL_TABLET | Freq: Two times a day (BID) | ORAL | Status: DC

## 2023-04-08 MED ORDER — DONEPEZIL HCL 5 MG PO TABS
10.0000 mg | ORAL_TABLET | Freq: Every day | ORAL | Status: DC
  Administered 2023-04-08: 10 mg via ORAL
  Filled 2023-04-08 (×2): qty 2

## 2023-04-08 MED ORDER — AMIODARONE HCL 200 MG PO TABS
100.0000 mg | ORAL_TABLET | Freq: Every day | ORAL | Status: DC
  Administered 2023-04-08: 100 mg via ORAL
  Filled 2023-04-08 (×2): qty 1

## 2023-04-08 MED ORDER — METOPROLOL SUCCINATE ER 50 MG PO TB24
50.0000 mg | ORAL_TABLET | Freq: Every day | ORAL | Status: DC
Start: 2023-04-08 — End: 2023-04-08

## 2023-04-08 MED ORDER — VITAMIN D 25 MCG (1000 UNIT) PO TABS
2000.0000 [IU] | ORAL_TABLET | Freq: Every day | ORAL | Status: DC
  Administered 2023-04-08: 2000 [IU] via ORAL
  Filled 2023-04-08 (×2): qty 2

## 2023-04-08 MED ORDER — AMIODARONE HCL 200 MG PO TABS: 200.0000 mg | ORAL_TABLET | Freq: Every day | ORAL | Status: DC

## 2023-04-08 MED ORDER — MELATONIN 5 MG PO TABS
10.0000 mg | ORAL_TABLET | Freq: Every day | ORAL | Status: DC
  Filled 2023-04-08: qty 2

## 2023-04-08 MED ORDER — DIVALPROEX SODIUM 125 MG PO CSDR
125.0000 mg | DELAYED_RELEASE_CAPSULE | Freq: Two times a day (BID) | ORAL | Status: DC
Start: 2023-04-08 — End: 2023-04-09
  Filled 2023-04-08 (×2): qty 1

## 2023-04-08 MED ORDER — ADULT MULTIVITAMIN W/MINERALS CH
1.0000 | ORAL_TABLET | Freq: Every day | ORAL | Status: DC
  Administered 2023-04-08: 1 via ORAL
  Filled 2023-04-08 (×2): qty 1

## 2023-04-08 MED ORDER — FERROUS SULFATE 325 (65 FE) MG PO TABS
324.0000 mg | ORAL_TABLET | Freq: Every day | ORAL | Status: DC
  Filled 2023-04-08: qty 1
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
## 2023-04-08 NOTE — Progress Notes (Signed)
 [facility]  - [place] at [facility]   PATIENT NAME: Alison Price    MR#:  [id number]  DATE OF BIRTH:  Apr 13, 1945  SUBJECTIVE:   No family at bedside. Patient appears confused however at times able to redirect her. Unable to give any history review system. No fever. Patient has been pulling IVs out.   VITALS:  Blood pressure 124/69, pulse (!) 55, temperature 98.1 F (36.7 C), resp. rate 16, height 5\' 5"  (1.651 m), weight 68 kg, SpO2 99%.  PHYSICAL EXAMINATION:  limited GENERAL:  78 y.o.-year-old patient with no acute distress.  LUNGS: Normal breath sounds bilaterally CARDIOVASCULAR: S1, S2 normal. No murmur   NEUROLOGIC: nonfocal  patient is alert has dementia at baseline  SKIN: per RN  LABORATORY PANEL:  CBC Recent Labs  Lab 04/08/23 0644  WBC 3.3*  HGB 9.0*  HCT 26.6*  PLT 110*    Chemistries  Recent Labs  Lab 04/08/23 0644  NA 138  K 3.7  CL 107  CO2 22  GLUCOSE 81  BUN 36*  CREATININE 1.29*  CALCIUM 8.8*  AST 22  ALT 12  ALKPHOS 51  BILITOT 0.5   Cardiac Enzymes No results for input(s): "TROPONINI" in the last 168 hours. RADIOLOGY:  CT Head Wo Contrast Result Date: 04/07/2023 CLINICAL DATA:  Fall EXAM: CT HEAD WITHOUT CONTRAST CT CERVICAL SPINE WITHOUT CONTRAST TECHNIQUE: Multidetector CT imaging of the head and cervical spine was performed following the standard protocol without intravenous contrast. Multiplanar CT image reconstructions of the cervical spine were also generated. RADIATION DOSE REDUCTION: This exam was performed according to the departmental dose-optimization program which includes automated exposure control, adjustment of the mA and/or kV according to patient size and/or use of iterative reconstruction technique. COMPARISON:  None Available. FINDINGS: CT HEAD FINDINGS Brain: There is no mass, hemorrhage or extra-axial collection. There is generalized atrophy without lobar predilection. Hypodensity of the white matter  is most commonly associated with chronic microvascular disease. Vascular: No hyperdense vessel or unexpected vascular calcification. Skull: The visualized skull base, calvarium and extracranial soft tissues are normal. Sinuses/Orbits: No fluid levels or advanced mucosal thickening of the visualized paranasal sinuses. No mastoid or middle ear effusion. Normal orbits. Other: None. CT CERVICAL SPINE FINDINGS Alignment: No static subluxation. Facets are aligned. Occipital condyles are normally positioned. Skull base and vertebrae: No acute fracture. There is fusion on the right at C1-2. The right C2 and C3 facets are fused. Chronic wedge compression fractures of C6 and C7. Soft tissues and spinal canal: No prevertebral fluid or swelling. No visible canal hematoma. Disc levels: No advanced spinal canal or neural foraminal stenosis. Upper chest: No pneumothorax, pulmonary nodule or pleural effusion. Other: Normal visualized paraspinal cervical soft tissues. IMPRESSION: 1. No acute intracranial abnormality. 2. No acute fracture or static subluxation of the cervical spine. 3. Chronic wedge compression fractures of C6 and C7. Electronically Signed   By: Deatra Robinson M.D.   On: 04/07/2023 02:58   CT Cervical Spine Wo Contrast Result Date: 04/07/2023 CLINICAL DATA:  Fall EXAM: CT HEAD WITHOUT CONTRAST CT CERVICAL SPINE WITHOUT CONTRAST TECHNIQUE: Multidetector CT imaging of the head and cervical spine was performed following the standard protocol without intravenous contrast. Multiplanar CT image reconstructions of the cervical spine were also generated. RADIATION DOSE REDUCTION: This exam was performed according to the departmental dose-optimization program which includes automated exposure control, adjustment of the mA and/or kV according to patient size and/or use of iterative reconstruction technique. COMPARISON:  None  Available. FINDINGS: CT HEAD FINDINGS Brain: There is no mass, hemorrhage or extra-axial collection.  There is generalized atrophy without lobar predilection. Hypodensity of the white matter is most commonly associated with chronic microvascular disease. Vascular: No hyperdense vessel or unexpected vascular calcification. Skull: The visualized skull base, calvarium and extracranial soft tissues are normal. Sinuses/Orbits: No fluid levels or advanced mucosal thickening of the visualized paranasal sinuses. No mastoid or middle ear effusion. Normal orbits. Other: None. CT CERVICAL SPINE FINDINGS Alignment: No static subluxation. Facets are aligned. Occipital condyles are normally positioned. Skull base and vertebrae: No acute fracture. There is fusion on the right at C1-2. The right C2 and C3 facets are fused. Chronic wedge compression fractures of C6 and C7. Soft tissues and spinal canal: No prevertebral fluid or swelling. No visible canal hematoma. Disc levels: No advanced spinal canal or neural foraminal stenosis. Upper chest: No pneumothorax, pulmonary nodule or pleural effusion. Other: Normal visualized paraspinal cervical soft tissues. IMPRESSION: 1. No acute intracranial abnormality. 2. No acute fracture or static subluxation of the cervical spine. 3. Chronic wedge compression fractures of C6 and C7. Electronically Signed   By: Deatra Robinson M.D.   On: 04/07/2023 02:58   DG Chest Port 1 View Result Date: 04/06/2023 CLINICAL DATA:  Altered mental status EXAM: PORTABLE CHEST 1 VIEW COMPARISON:  06/21/2022, CT 06/23/2022 FINDINGS: Mild cardiomegaly. Hazy atelectasis in the right infrahilar lung. No pleural effusion. Lucency beneath the right diaphragm IMPRESSION: 1. Lucency beneath the right diaphragm, could be due to air containing high-riding bowel given somewhat similar appearance on comparison chest x-ray from May, however consider decubitus view for further assessment. 2. Mild cardiomegaly. Hazy atelectasis in the right infrahilar lung. Electronically Signed   By: Jasmine Pang M.D.   On: 04/06/2023 20:58    CT Head Wo Contrast Result Date: 04/06/2023 CLINICAL DATA:  Altered mental status EXAM: CT HEAD WITHOUT CONTRAST TECHNIQUE: Contiguous axial images were obtained from the base of the skull through the vertex without intravenous contrast. RADIATION DOSE REDUCTION: This exam was performed according to the departmental dose-optimization program which includes automated exposure control, adjustment of the mA and/or kV according to patient size and/or use of iterative reconstruction technique. COMPARISON:  11/01/2020 FINDINGS: Brain: There is no mass, hemorrhage or extra-axial collection. There is generalized atrophy without lobar predilection. Hypodensity of the white matter is most commonly associated with chronic microvascular disease. Vascular: Atherosclerotic calcification of the vertebral and internal carotid arteries at the skull base. No abnormal hyperdensity of the major intracranial arteries or dural venous sinuses. Skull: The visualized skull base, calvarium and extracranial soft tissues are normal. Sinuses/Orbits: No fluid levels or advanced mucosal thickening of the visualized paranasal sinuses. No mastoid or middle ear effusion. Normal orbits. Other: None. IMPRESSION: 1. No acute intracranial abnormality. 2. Generalized atrophy and findings of chronic microvascular disease. Electronically Signed   By: Deatra Robinson M.D.   On: 04/06/2023 20:19    Assessment and Plan  Alison Price is a 78 y.o. female with medical history significant of dementia, hypertension, GERD, arthritis, Stevens-Johnson's syndrome, recurrent falls presented with encephalopathy, UTI, fall.   CT head, chest x-ray grossly stable apart from hazy atelectasis in the right infrahilar lung.  CT head and CT C-spine within normal limits.   UTI (urinary tract infection) Encephalopathy Chronic Dementia --Acute decompensated encephalopathy in setting of UTI --Urinalysis indicative of infection--pt unable to report  symptoms --received IV fluid hydration --IV Rocephin--change to po levaquin (empiric) --pt pulled 3 IV's out  per RN --As needed Haldol for agitation   PNA (pneumonia) --Noted recent diagnosis of pna and SNF  --No hypoxia at present w/ hazy atelectasis in RLL  --Clinically I do not feel she has PNA but since started as out pt on abxs will finish with po levaquin   PAF (paroxysmal atrial fibrillation) (HCC) --Stable --Hold eliquis for now in setting of recurring falls.  --hold BB HR 55-58 --on amiodarone   Fall --Recurrent falls in setting of encephalopathy, end stage dementia, uti Baseline increased fall risk  --Fall precautions    Essential hypertension --BP stable   Palliative care input appreciated TOC for d/c planning to [facility] with hospice  Family communication :none today CODE STATUS: DNR DVT Prophylaxis : Level of care: Med-Surg Status is: Inpatient Remains inpatient appropriate because: confusion,fall    TOTAL TIME TAKING CARE OF THIS PATIENT: 45 minutes.  >50% time spent on counselling and coordination of care  Note: This dictation was prepared with Dragon dictation along with smaller phrase technology. Any transcriptional errors that result from this process are unintentional.  Enedina Finner M.D    [facility]   CC: Primary care physician; Jaclyn Shaggy, MD

## 2023-04-08 NOTE — Progress Notes (Addendum)
 [facility] Liaison Note  Received request from Transitions of Care Manager, Joellyn Quails, for hospice services at [facility] after discharge.   Spoke with patient's sister to initiate education related to hospice philosophy, services, and team approach to care. Sister verbalized understanding of information given.    Facility to provide needed DME.    Please send signed and completed DNR LTC with patient/family. Please provide prescriptions at discharge as needed to ensure ongoing symptom management.    [facility] information and contact numbers given to family & above information shared with TOC.   Please call with any questions/concerns.    Thank you for the opportunity to participate in this patient's care.  [facility] Liaison [phone]

## 2023-04-08 NOTE — ED Notes (Signed)
 Per Dr. Allena Katz, Pt is Alison Price to go to the flooor with no IV. RN notified patel about pt ripping out 3-4 IV's.

## 2023-04-08 NOTE — Progress Notes (Signed)
 Attempted to assess patient's skin with second RN. Patient cursing at RNs and states she is not agreeable to skin assessment at this time. Patient swinging at staff. Skin not assessed at this time.

## 2023-04-09 ENCOUNTER — Other Ambulatory Visit: Payer: Self-pay

## 2023-04-09 ENCOUNTER — Encounter: Payer: Self-pay | Admitting: Oncology

## 2023-04-09 DIAGNOSIS — F0390 Unspecified dementia without behavioral disturbance: Secondary | ICD-10-CM | POA: Diagnosis not present

## 2023-04-09 DIAGNOSIS — I10 Essential (primary) hypertension: Secondary | ICD-10-CM | POA: Diagnosis not present

## 2023-04-09 DIAGNOSIS — Z7189 Other specified counseling: Secondary | ICD-10-CM

## 2023-04-09 DIAGNOSIS — Z515 Encounter for palliative care: Secondary | ICD-10-CM | POA: Diagnosis not present

## 2023-04-09 DIAGNOSIS — I480 Paroxysmal atrial fibrillation: Secondary | ICD-10-CM | POA: Diagnosis not present

## 2023-04-09 DIAGNOSIS — N390 Urinary tract infection, site not specified: Secondary | ICD-10-CM | POA: Diagnosis not present

## 2023-04-09 MED ORDER — LEVOFLOXACIN 750 MG PO TABS
750.0000 mg | ORAL_TABLET | ORAL | 0 refills | Status: AC
  Filled 2023-04-09: qty 2, 4d supply, fill #0

## 2023-04-09 MED ORDER — AMIODARONE HCL 100 MG PO TABS
100.0000 mg | ORAL_TABLET | Freq: Every day | ORAL | 0 refills | Status: AC
  Filled 2023-04-09: qty 30, 30d supply, fill #0

## 2023-04-09 NOTE — Progress Notes (Signed)
                                                     Palliative Care Progress Note, Assessment & Plan   Patient Name: Alison Price       Date: 04/09/2023 DOB: January 13, 1946  Age: 78 y.o. MRN#: [id number] Attending Physician: Enedina Finner, MD Primary Care Physician: Jaclyn Shaggy, MD Admit Date: 04/06/2023  Subjective: Patient is sitting up in bed in no apparent distress.  She is awake and alert.  She acknowledges my presence.  No family or friends present during my visit.  HPI: 78 y.o. female  with past medical history of dementia, hypertension, GERD, arthritis, Stevens-Johnson's syndrome, recurrent falls admitted on 04/06/2023 with encephalopathy and UTI. Patient came from [facility] with noted increased confusion from baseline; also had a fall in ED. PMT consulted to discuss GOC.   Summary of counseling/coordination of care: Extensive chart review completed prior to meeting patient including labs, vital signs, imaging, progress notes, orders, and available advanced directive documents from current and previous encounters.   After reviewing the patient's chart and assessing the patient at bedside, I spoke with patient in regards to symptom management.  Symptoms assessed.  Patient denies pain or discomfort at this time.  Her responses were delayed but appropriate.  She responded with 1-2 word answers.  She also stated "it does not matter" a few times.  No signs of distress noted.  No adjustment to [facility] needed at this time.  As per chart review, discharge summary is in place.  Plan remains for patient to discharge to [facility] with hospice services to follow.  No further palliative needs at this time.  Please reengage with PMT if goals change or if patient's health deteriorates during hospitalization/prior to discharge.  Physical Exam Vitals reviewed.   HENT:     Head: Normocephalic.     Nose: Nose normal.     Mouth/Throat:     Mouth: Mucous membranes are moist.  Eyes:     Pupils: Pupils are equal, round, and reactive to light.  Pulmonary:     Effort: Pulmonary effort is normal.  Abdominal:     Palpations: Abdomen is soft.  Skin:    General: Skin is warm and dry.  Neurological:     Mental Status: She is alert.     Comments: Oriented to self  Psychiatric:        Mood and Affect: Mood normal.        Behavior: Behavior normal.             Total Time 25 minutes   Time spent includes: Detailed review of medical records (labs, imaging, vital signs), medically appropriate exam (mental status, respiratory, cardiac, skin), discussed with treatment team, counseling and educating patient, family and staff, documenting clinical information, medication management and coordination of care.  Samara Deist L. Bonita Quin, DNP, FNP-BC Palliative Medicine Team

## 2023-04-09 NOTE — [place] FL2 (Signed)
 [place] MEDICAID FL2 LEVEL OF CARE FORM     IDENTIFICATION  Patient Name: Alison Price Birthdate: 05-19-1945 Sex: female Admission Date (Current Location): 04/06/2023  [place] and [place] Number:  Chiropodist and Address:  [facility], [address] [place], [place] [address]      Provider Number: [id number]  Attending Physician Name and Address:  Enedina Finner, MD  Relative Name and Phone Number:  [facility]  Sister  Emergency Contact  [phone]    Current Level of Care: Hospital Recommended Level of Care: Nursing Facility Prior Approval Number:    Date Approved/Denied:   PASRR Number:    Discharge Plan: Domiciliary (Rest home) (ALF)    Current Diagnoses: Patient Active Problem List   Diagnosis Date Noted   Goals of care, counseling/discussion 04/09/2023   Dementia without behavioral disturbance, psychotic disturbance, mood disturbance, or anxiety (HCC) 04/08/2023   UTI (urinary tract infection) 04/07/2023   Encephalopathy 04/07/2023   PNA (pneumonia) 04/07/2023   Dilated cardiomyopathy (HCC) 06/26/2022   Altered mental status 06/25/2022   Lactic acidosis 06/25/2022   PAF (paroxysmal atrial fibrillation) (HCC) 06/25/2022   Hydronephrosis with urinary obstruction due to renal calculus 06/24/2022   Nephrolithiasis 06/24/2022   AKI (acute kidney injury) (HCC) 06/24/2022   Lower urinary tract infectious disease 06/24/2022   Severe sepsis with lactic acidosis (HCC) 06/21/2022   Wrist fracture 11/03/2020   Right patella fracture 11/01/2020   Left ulnar fracture 11/01/2020   Left radial fracture 11/01/2020   Fall 11/01/2020   Sepsis (HCC)    Acute pyelonephritis 04/13/2020   Obstructive uropathy 04/13/2020   Normocytic anemia 04/13/2020   Depression    Chronic pain    Protein-calorie malnutrition, severe 07/01/2019   Other chronic pain    Urinary tract infection due to extended-spectrum beta lactamase (ESBL)  producing Escherichia coli    Weakness    Constipation    Abdominal pain 06/29/2019   Hypomagnesemia    Gastroesophageal reflux disease without esophagitis    Hip fx (HCC) 08/05/2017   Nausea and vomiting 10/23/2016   Hypokalemia 10/23/2016   Dehydration 10/23/2016   Essential hypertension 10/23/2016   History of total abdominal hysterectomy 06/26/2015   Pelvic pressure in female 06/26/2015   Vaginal atrophy 06/26/2015   Hematuria 06/26/2015   Dysuria 06/26/2015   Pancytopenia (HCC) 04/13/2015    Orientation RESPIRATION BLADDER Height & Weight        Normal Incontinent Weight: 68 kg Height:  5\' 5"  (165.1 cm)  BEHAVIORAL SYMPTOMS/MOOD NEUROLOGICAL BOWEL NUTRITION STATUS      Incontinent Diet (see dc sumnmary)  AMBULATORY STATUS COMMUNICATION OF NEEDS Skin   Extensive Assist Verbally Normal                       Personal Care Assistance Level of Assistance  Bathing, Feeding, Dressing Bathing Assistance: Maximum assistance Feeding assistance: Maximum assistance Dressing Assistance: Maximum assistance     Functional Limitations Info             SPECIAL CARE FACTORS FREQUENCY                       Contractures Contractures Info: Not present    Additional Factors Info  Code Status, Allergies Code Status Info: dnr limited Allergies Info: sulfa antibiotics, codeine           Current Medications (04/09/2023):  This is the current hospital active medication list Current Facility-Administered Medications  Medication Dose Route Frequency Provider Last Rate Last Admin   amiodarone (PACERONE) tablet 100 mg  100 mg Oral Daily Enedina Finner, MD   100 mg at 04/08/23 1808   cholecalciferol (VITAMIN D3) 25 MCG (1000 UNIT) tablet 2,000 Units  2,000 Units Oral Daily Enedina Finner, MD   2,000 Units at 04/08/23 1807   divalproex (DEPAKOTE SPRINKLE) capsule 125 mg  125 mg Oral BID Enedina Finner, MD       donepezil (ARICEPT) tablet 10 mg  10 mg Oral Daily Enedina Finner, MD    10 mg at 04/08/23 1808   enoxaparin (LOVENOX) injection 40 mg  40 mg Subcutaneous Q24H Mila Merry A, RPH   40 mg at 04/08/23 2143   ferrous sulfate tablet 324 mg  324 mg Oral Q breakfast Enedina Finner, MD       levofloxacin (LEVAQUIN) tablet 750 mg  750 mg Oral Q48H Enedina Finner, MD       melatonin tablet 10 mg  10 mg Oral QHS Enedina Finner, MD       multivitamin with minerals tablet 1 tablet  1 tablet Oral Daily Enedina Finner, MD   1 tablet at 04/08/23 1808   OLANZapine (ZYPREXA) tablet 2.5 mg  2.5 mg Oral QHS Enedina Finner, MD       ondansetron [facility]) tablet 4 mg  4 mg Oral Q6H PRN Floydene Flock, MD       Or   ondansetron [facility]) injection 4 mg  4 mg Intravenous Q6H PRN Floydene Flock, MD         Discharge Medications: Please see discharge summary for a list of discharge medications.  Relevant Imaging Results:  Relevant Lab Results:   Additional Information SS- [id number]  Marlowe Sax, RN

## 2023-04-09 NOTE — Care Management Important Message (Signed)
 Important Message  Patient Details  Name: Alison Price MRN: [id number] Date of Birth: Jul 11, 1945   Important Message Given:  Yes - Medicare IM     Cristela Blue, CMA 04/09/2023, 10:34 AM

## 2023-04-09 NOTE — Discharge Summary (Signed)
 Physician Discharge Summary   Patient: Alison Price MRN: [id number] DOB: 09-Jun-1945  Admit date:     04/06/2023  Discharge date: 04/09/23  Discharge Physician: Enedina Finner   PCP: Jaclyn Shaggy, MD   Recommendations at discharge:    F/u PCP in 1-2 weeks patient to be [facility] Holding metoprolol since HR was 47-54. HR on day of d/c is 70's. Resume metoprolol if HR remains >70  Discharge Diagnoses: Principal Problem:   UTI (urinary tract infection) Active Problems:   Essential hypertension   Fall   PAF (paroxysmal atrial fibrillation) (HCC)   Encephalopathy   PNA (pneumonia)   Dementia without behavioral disturbance, psychotic disturbance, mood disturbance, or anxiety (HCC)   Goals of care, counseling/discussion  Alison Price is a 78 y.o. female with medical history significant of dementia, hypertension, GERD, arthritis, Stevens-Johnson's syndrome, recurrent falls presented with encephalopathy, UTI, fall.    CT head, chest x-ray grossly stable apart from hazy atelectasis in the right infrahilar lung.  CT head and CT C-spine within normal limits.    UTI (urinary tract infection) Encephalopathy Chronic Dementia  --Acute decompensated encephalopathy in setting of UTI --Urinalysis indicative of infection--pt unable to report symptoms --received IV fluid hydration --IV Rocephin--change to po levaquin (empiric) --pt pulled 3 IV's out per RN --As needed Haldol for agitation --pt hemodynamically stable. Per RN world refused PO meds and diet. Sr. made aware. -- Urine culture multiple species -- blood culture negative   PNA (pneumonia) --Noted recent diagnosis of pna as outpatient  --No hypoxia at present w/ hazy atelectasis in RLL  --Clinically I do not feel she has PNA but since started as out pt on abxs will finish with po levaquin   PAF (paroxysmal atrial fibrillation) (HCC) --Stable --Hold eliquis for now in setting of  recurring falls.-- Sr. [name] is aware and agrees --hold BB HR 55-58. Defer to PCP to resume if heart rate remains more than 70 --on amiodarone   Fall --Recurrent falls in setting of encephalopathy, end stage dementia, ?uti Baseline increased fall risk  --Fall precautions    Essential hypertension --BP stable    Palliative care input appreciated TOC for d/c planning to [facility] with hospice   Family communication : Sr. [name] on the phone today and discussed discharge plan. 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 Commonly known as: TOPROL-XL Take 1 tablet (50 mg total) by mouth daily. Take with or immediately following a meal.       STOP taking these medications    apixaban 5 MG Tabs tablet Commonly known as: ELIQUIS   cefTRIAXone 1 g injection Commonly  known as: ROCEPHIN   doxycycline 100 MG capsule Commonly known as: VIBRAMYCIN       TAKE these medications    acetaminophen 325 MG tablet Commonly known as: TYLENOL Take 2 tablets (650 mg total) by mouth every 6 (six) hours.   amiodarone 100 MG tablet Commonly known as: PACERONE Take 1 tablet (100 mg total) by mouth daily. Start taking on: April 10, 2023 What changed:  medication strength how much to take   Cholecalciferol 50 MCG (2000 UT) Caps Take 1  capsule by mouth daily.   divalproex 125 MG capsule Commonly known as: DEPAKOTE SPRINKLE Take 125 mg by mouth 2 (two) times daily.   donepezil 10 MG tablet Commonly known as: ARICEPT Take 10 mg by mouth daily.   ferrous sulfate 324 MG Tbec Take 324 mg by mouth daily with breakfast.   lactose free nutrition Liqd Take 237 mLs by mouth 2 (two) times daily between meals.   levofloxacin 750 MG tablet Commonly known as: LEVAQUIN Take 1 tablet (750 mg total) by mouth every other day for 2 doses. Start taking on: April 10, 2023   Melatonin 10 MG Tabs Take 10 mg by mouth at bedtime.   multivitamin tablet Take 1 tablet by mouth daily.   OLANZapine 2.5 MG tablet Commonly known as: ZYPREXA Take 2.5 mg by mouth at bedtime.   sacubitril-valsartan 24-26 MG Commonly known as: ENTRESTO Take 1 tablet by mouth 2 (two) times daily.   sennosides-docusate sodium 8.6-50 MG tablet Commonly known as: SENOKOT-S Take 1 tablet by mouth daily as needed. Takes with pain medication        Follow-up Information     [facility] .   Specialty: Emergency Medicine Why: If symptoms worsen Contact information: [address] [place] [address] [phone]        Jaclyn Shaggy, MD. Schedule an appointment as soon as possible for a visit .   Specialty: Internal Medicine Contact information: [address]   [place] [address] [phone]                Discharge Exam: Ceasar Mons Weights   04/06/23 1840  Weight: 68 kg   mited GENERAL:  78 y.o.-year-old patient with no acute distress.  LUNGS: Normal breath sounds bilaterally CARDIOVASCULAR: S1, S2 normal. No murmur   NEUROLOGIC: nonfocal  patient is alert has dementia at baseline  SKIN: per RN  Condition at discharge: fair  The results of significant diagnostics from this hospitalization (including imaging, microbiology, ancillary and laboratory) are  listed below for reference.   Imaging Studies: CT Head Wo Contrast Result Date: 04/07/2023 CLINICAL DATA:  Fall EXAM: CT HEAD WITHOUT CONTRAST CT CERVICAL SPINE WITHOUT CONTRAST TECHNIQUE: Multidetector CT imaging of the head and cervical spine was performed following the standard protocol without intravenous contrast. Multiplanar CT image reconstructions of the cervical spine were also generated. RADIATION DOSE REDUCTION: This exam was performed according to the departmental dose-optimization program which includes automated exposure control, adjustment of the mA and/or kV according to patient size and/or use of iterative reconstruction technique. COMPARISON:  None Available. FINDINGS: CT HEAD FINDINGS Brain: There is no mass, hemorrhage or extra-axial collection. There is generalized atrophy without lobar predilection. Hypodensity of the white matter is most commonly associated with chronic microvascular disease. Vascular: No hyperdense vessel or unexpected vascular calcification. Skull: The visualized skull base, calvarium and extracranial soft tissues are normal. Sinuses/Orbits: No fluid levels or advanced mucosal  thickening of the visualized paranasal sinuses. No mastoid or middle ear effusion. Normal orbits. Other: None. CT CERVICAL SPINE FINDINGS Alignment: No static subluxation. Facets are aligned. Occipital condyles are normally positioned. Skull base and vertebrae: No acute fracture. There is fusion on the right at C1-2. The right C2 and C3 facets are fused. Chronic wedge compression fractures of C6 and C7. Soft tissues and spinal canal: No prevertebral fluid or swelling. No visible canal hematoma. Disc levels: No advanced spinal canal or neural foraminal stenosis. Upper chest: No pneumothorax, pulmonary nodule or pleural effusion. Other: Normal visualized paraspinal cervical soft tissues. IMPRESSION: 1. No acute intracranial abnormality. 2. No acute fracture or static subluxation of the cervical  spine. 3. Chronic wedge compression fractures of C6 and C7. Electronically Signed   By: Deatra Robinson M.D.   On: 04/07/2023 02:58   CT Cervical Spine Wo Contrast Result Date: 04/07/2023 CLINICAL DATA:  Fall EXAM: CT HEAD WITHOUT CONTRAST CT CERVICAL SPINE WITHOUT CONTRAST TECHNIQUE: Multidetector CT imaging of the head and cervical spine was performed following the standard protocol without intravenous contrast. Multiplanar CT image reconstructions of the cervical spine were also generated. RADIATION DOSE REDUCTION: This exam was performed according to the departmental dose-optimization program which includes automated exposure control, adjustment of the mA and/or kV according to patient size and/or use of iterative reconstruction technique. COMPARISON:  None Available. FINDINGS: CT HEAD FINDINGS Brain: There is no mass, hemorrhage or extra-axial collection. There is generalized atrophy without lobar predilection. Hypodensity of the white matter is most commonly associated with chronic microvascular disease. Vascular: No hyperdense vessel or unexpected vascular calcification. Skull: The visualized skull base, calvarium and extracranial soft tissues are normal. Sinuses/Orbits: No fluid levels or advanced mucosal thickening of the visualized paranasal sinuses. No mastoid or middle ear effusion. Normal orbits. Other: None. CT CERVICAL SPINE FINDINGS Alignment: No static subluxation. Facets are aligned. Occipital condyles are normally positioned. Skull base and vertebrae: No acute fracture. There is fusion on the right at C1-2. The right C2 and C3 facets are fused. Chronic wedge compression fractures of C6 and C7. Soft tissues and spinal canal: No prevertebral fluid or swelling. No visible canal hematoma. Disc levels: No advanced spinal canal or neural foraminal stenosis. Upper chest: No pneumothorax, pulmonary nodule or pleural effusion. Other: Normal visualized paraspinal cervical soft tissues. IMPRESSION: 1. No  acute intracranial abnormality. 2. No acute fracture or static subluxation of the cervical spine. 3. Chronic wedge compression fractures of C6 and C7. Electronically Signed   By: Deatra Robinson M.D.   On: 04/07/2023 02:58   DG Chest Port 1 View Result Date: 04/06/2023 CLINICAL DATA:  Altered mental status EXAM: PORTABLE CHEST 1 VIEW COMPARISON:  06/21/2022, CT 06/23/2022 FINDINGS: Mild cardiomegaly. Hazy atelectasis in the right infrahilar lung. No pleural effusion. Lucency beneath the right diaphragm IMPRESSION: 1. Lucency beneath the right diaphragm, could be due to air containing high-riding bowel given somewhat similar appearance on comparison chest x-ray from May, however consider decubitus view for further assessment. 2. Mild cardiomegaly. Hazy atelectasis in the right infrahilar lung. Electronically Signed   By: Jasmine Pang M.D.   On: 04/06/2023 20:58   CT Head Wo Contrast Result Date: 04/06/2023 CLINICAL DATA:  Altered mental status EXAM: CT HEAD WITHOUT CONTRAST TECHNIQUE: Contiguous axial images were obtained from the base of the skull through the vertex without intravenous contrast. RADIATION DOSE REDUCTION: This exam was performed according to the departmental dose-optimization program which includes automated exposure control, adjustment of the mA  and/or kV according to patient size and/or use of iterative reconstruction technique. COMPARISON:  11/01/2020 FINDINGS: Brain: There is no mass, hemorrhage or extra-axial collection. There is generalized atrophy without lobar predilection. Hypodensity of the white matter is most commonly associated with chronic microvascular disease. Vascular: Atherosclerotic calcification of the vertebral and internal carotid arteries at the skull base. No abnormal hyperdensity of the major intracranial arteries or dural venous sinuses. Skull: The visualized skull base, calvarium and extracranial soft tissues are normal. Sinuses/Orbits: No fluid levels or advanced  mucosal thickening of the visualized paranasal sinuses. No mastoid or middle ear effusion. Normal orbits. Other: None. IMPRESSION: 1. No acute intracranial abnormality. 2. Generalized atrophy and findings of chronic microvascular disease. Electronically Signed   By: Deatra Robinson M.D.   On: 04/06/2023 20:19    Microbiology: Results for orders placed or performed during the hospital encounter of 04/06/23  Urine Culture     Status: Abnormal   Collection Time: 04/07/23  6:44 AM   Specimen: Urine, Catheterized  Result Value Ref Range Status   Specimen Description   Final    URINE, CATHETERIZED Performed at [facility], [address] [place], [place] [address]    Special Requests   Final    NONE Performed at [facility], [address], [place], [place] [address]    Culture MULTIPLE SPECIES PRESENT, SUGGEST RECOLLECTION (A)  Final   Report Status 04/08/2023 FINAL  Final  Blood culture (routine x 2)     Status: None (Preliminary result)   Collection Time: 04/07/23 10:17 AM   Specimen: BLOOD  Result Value Ref Range Status   Specimen Description BLOOD BLOOD LEFT FOREARM  Final   Special Requests   Final    BOTTLES DRAWN AEROBIC AND ANAEROBIC Blood Culture results may not be optimal due to an inadequate volume of blood received in culture bottles   Culture   Final    NO GROWTH 2 DAYS Performed at [facility], [address] [place], [place] [address]    Report Status PENDING  Incomplete  Blood culture (routine x 2)     Status: None (Preliminary result)   Collection Time: 04/07/23 10:22 AM   Specimen: BLOOD  Result Value Ref Range Status   Specimen Description BLOOD RIGHT ANTECUBITAL  Final   Special Requests   Final    BOTTLES DRAWN AEROBIC AND ANAEROBIC Blood Culture adequate volume   Culture   Final    NO GROWTH 2 DAYS Performed at [facility], [address] [place], [place] [address]    Report Status PENDING  Incomplete     Labs: CBC: Recent Labs  Lab 04/06/23 1843 04/08/23 0644  WBC 5.4 3.3*  HGB 10.1* 9.0*  HCT 30.3* 26.6*  MCV 95.9 95.7  PLT 159 110*   Basic Metabolic Panel: Recent Labs  Lab 04/06/23 1843 04/08/23 0644  NA 137 138  K 4.4 3.7  CL 99 107  CO2 24 22  GLUCOSE 96 81  BUN 51* 36*  CREATININE 1.45* 1.29*  CALCIUM 10.1 8.8*   Liver Function Tests: Recent Labs  Lab 04/06/23 1843 04/08/23 0644  AST 25 22  ALT 13 12  ALKPHOS 58 51  BILITOT 0.6 0.5  PROT 7.6 6.6  ALBUMIN 3.9 3.2*   CBG: No results for input(s): "GLUCAP" in the last 168 hours.  Discharge time spent: greater than 30 minutes.  Signed: Enedina Finner, MD [facility] 04/09/2023

## 2023-04-09 NOTE — Progress Notes (Signed)
 [facility] Liaison Note  Patient will discharge back to [facility] when medically stable. Referral for Hospice services at [facility] has been submitted.  Thank you for the opportunity to participate in this patient's care  Please call with any Hospice related questions or concerns  [facility] Liaison note [phone]

## 2023-04-09 NOTE — TOC Progression Note (Signed)
 Transition of Care [facility]) - Progression Note    Patient Details  Name: Alison Price MRN: [id number] Date of Birth: [birth year]  [facility]) CM/SW Contact  Marlowe Sax, RN Phone Number: 04/09/2023, 12:24 PM  Clinical Narrative:      Sister made aware that she is transporting back to [facility] to room 905B EMS called and arranged transport       Expected Discharge Plan and Services         Expected Discharge Date: 04/09/23                                     Social Determinants of Health (SDOH) Interventions SDOH Screenings   Food Insecurity: No Food Insecurity (04/08/2023)  Housing: Low Risk  (04/08/2023)  Transportation Needs: No Transportation Needs (04/08/2023)  Utilities: Not At Risk (04/08/2023)  Social Connections: Socially Isolated (04/08/2023)  Tobacco Use: Medium Risk (04/08/2023)    Readmission Risk Interventions     No data to display

## 2023-04-09 NOTE — Progress Notes (Signed)
 Called and spoke with sister [name] over the phone yday evening. [name] understands overall multiple co-morbidities and progressive dementia with behavioral issues. Pt has had few falls,no major trauma. We discussed risks and benefits of blood thinners in pt's like Junious Dresser.  [name] is in agreement to hold eliquis for now, treat the treatable and was very appreciative of Palliative care consult. She is open to hospice following Junious Dresser at the facility. [facility] involved.

## 2023-04-10 ENCOUNTER — Other Ambulatory Visit: Payer: Self-pay

## 2023-04-12 LAB — CULTURE, BLOOD (ROUTINE X 2)
Culture: NO GROWTH
Culture: NO GROWTH
Special Requests: ADEQUATE

## 2023-07-02 ENCOUNTER — Ambulatory Visit: Payer: 59 | Admitting: Urology

## 2023-07-09 ENCOUNTER — Ambulatory Visit: Payer: Self-pay | Admitting: Urology

## 2023-07-09 ENCOUNTER — Ambulatory Visit: Payer: 59 | Admitting: Urology

## 2024-02-18 DEATH — deceased
